# Patient Record
Sex: Male | Born: 1950 | Race: White | Hispanic: No | Marital: Married | Smoking: Former smoker
Health system: Southern US, Community
[De-identification: ages and names within clinical notes are randomized; demographics above are authoritative.]

## PROBLEM LIST (undated history)

## (undated) DIAGNOSIS — K219 Gastro-esophageal reflux disease without esophagitis: Secondary | ICD-10-CM

## (undated) DIAGNOSIS — I219 Acute myocardial infarction, unspecified: Secondary | ICD-10-CM

## (undated) DIAGNOSIS — G8929 Other chronic pain: Secondary | ICD-10-CM

## (undated) DIAGNOSIS — M5416 Radiculopathy, lumbar region: Secondary | ICD-10-CM

## (undated) DIAGNOSIS — E785 Hyperlipidemia, unspecified: Secondary | ICD-10-CM

## (undated) DIAGNOSIS — Z87442 Personal history of urinary calculi: Secondary | ICD-10-CM

## (undated) DIAGNOSIS — J189 Pneumonia, unspecified organism: Secondary | ICD-10-CM

## (undated) DIAGNOSIS — I10 Essential (primary) hypertension: Secondary | ICD-10-CM

## (undated) DIAGNOSIS — I2510 Atherosclerotic heart disease of native coronary artery without angina pectoris: Secondary | ICD-10-CM

## (undated) DIAGNOSIS — M109 Gout, unspecified: Secondary | ICD-10-CM

## (undated) DIAGNOSIS — R7303 Prediabetes: Secondary | ICD-10-CM

## (undated) DIAGNOSIS — M1990 Unspecified osteoarthritis, unspecified site: Secondary | ICD-10-CM

## (undated) DIAGNOSIS — R0600 Dyspnea, unspecified: Secondary | ICD-10-CM

## (undated) HISTORY — DX: Hyperlipidemia, unspecified: E78.5

## (undated) HISTORY — DX: Essential (primary) hypertension: I10

## (undated) HISTORY — PX: VASECTOMY: SHX75

## (undated) HISTORY — DX: Acute myocardial infarction, unspecified: I21.9

## (undated) HISTORY — PX: WISDOM TOOTH EXTRACTION: SHX21

## (undated) HISTORY — PX: CORONARY ANGIOPLASTY WITH STENT PLACEMENT: SHX49

## (undated) HISTORY — PX: BACK SURGERY: SHX140

## (undated) HISTORY — PX: CATARACT EXTRACTION: SUR2

## (undated) HISTORY — PX: TONSILLECTOMY: SUR1361

---

## 1972-04-13 HISTORY — PX: APPENDECTOMY: SHX54

## 1981-04-13 HISTORY — PX: KNEE ARTHROSCOPY: SHX127

## 2000-04-13 DIAGNOSIS — I219 Acute myocardial infarction, unspecified: Secondary | ICD-10-CM

## 2000-04-13 HISTORY — DX: Acute myocardial infarction, unspecified: I21.9

## 2006-04-13 HISTORY — PX: HEMI-MICRODISCECTOMY LUMBAR LAMINECTOMY LEVEL 1: SHX5846

## 2006-12-31 IMAGING — CR DG CHEST 2V
2 series · 2 of 2 positions shown · non-contrast
Comparison: none

CLINICAL DATA: Preop for L4-5 laminectomy. 
 CHEST - 2 VIEW:

[view not recorded (1 of 2)]
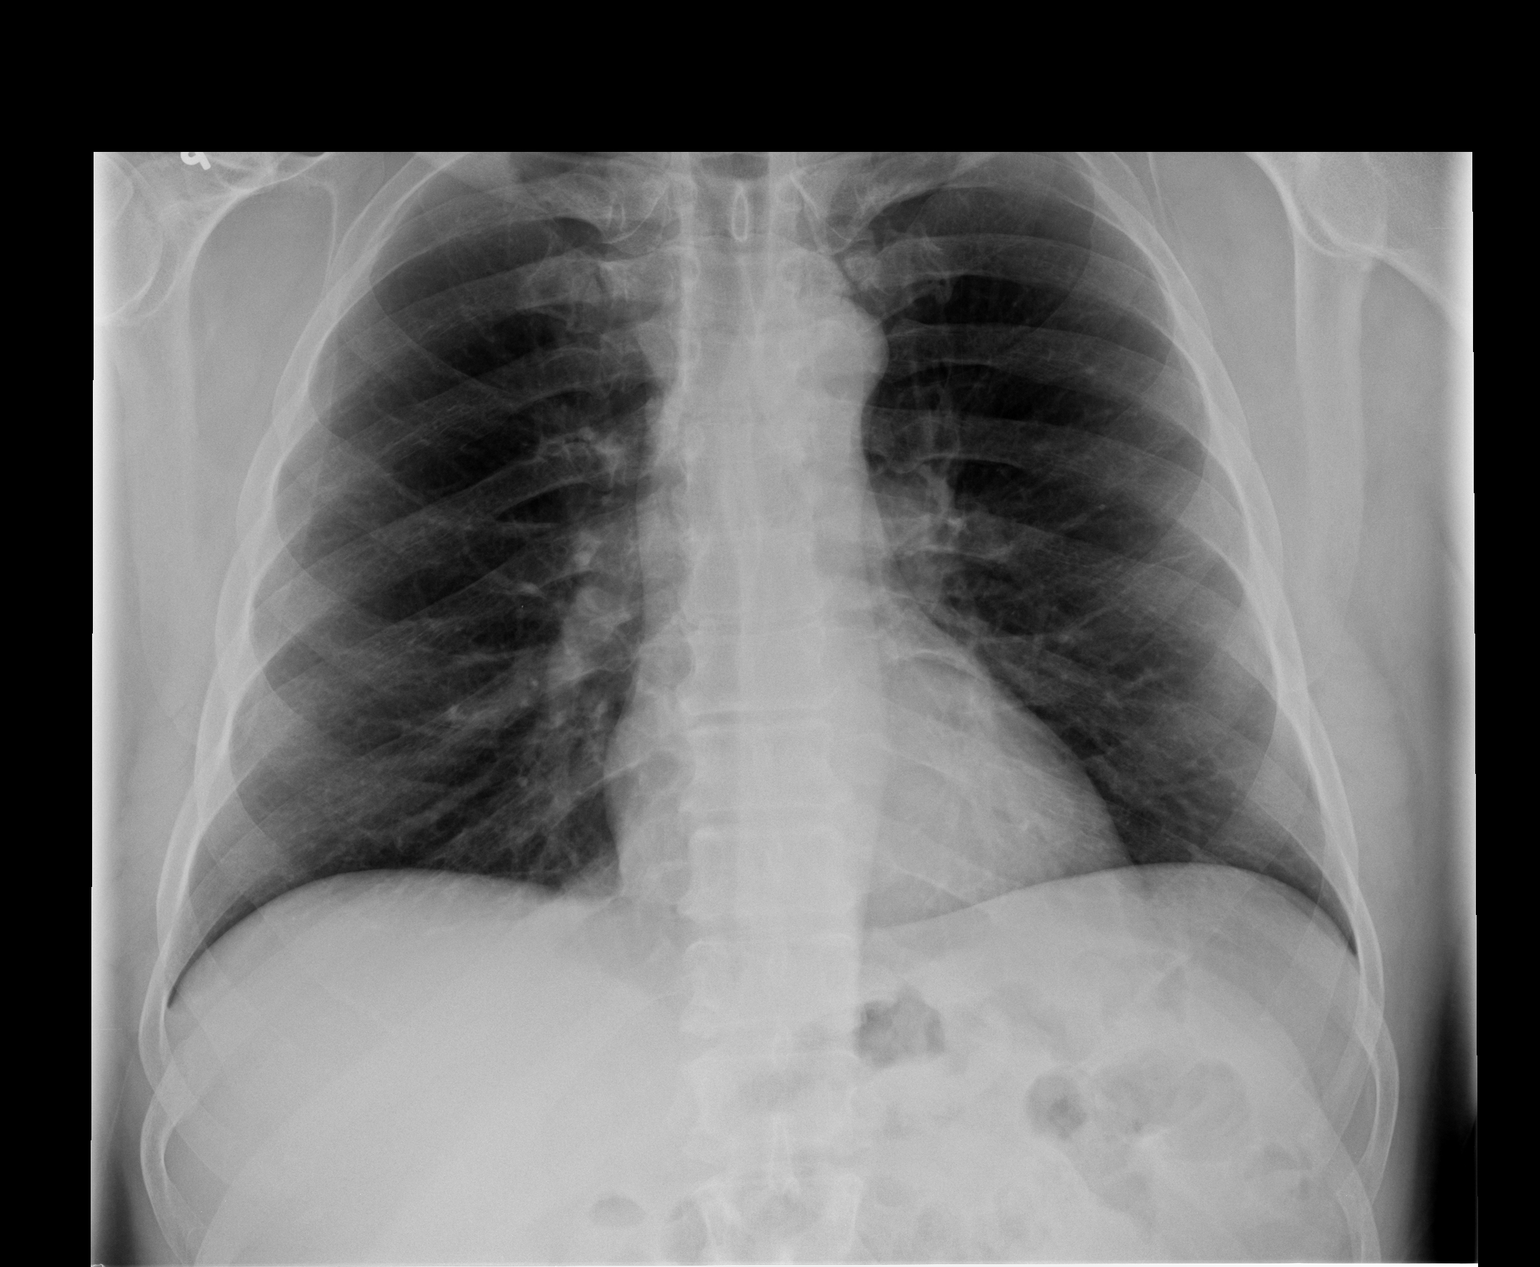

[view not recorded (2 of 2)]
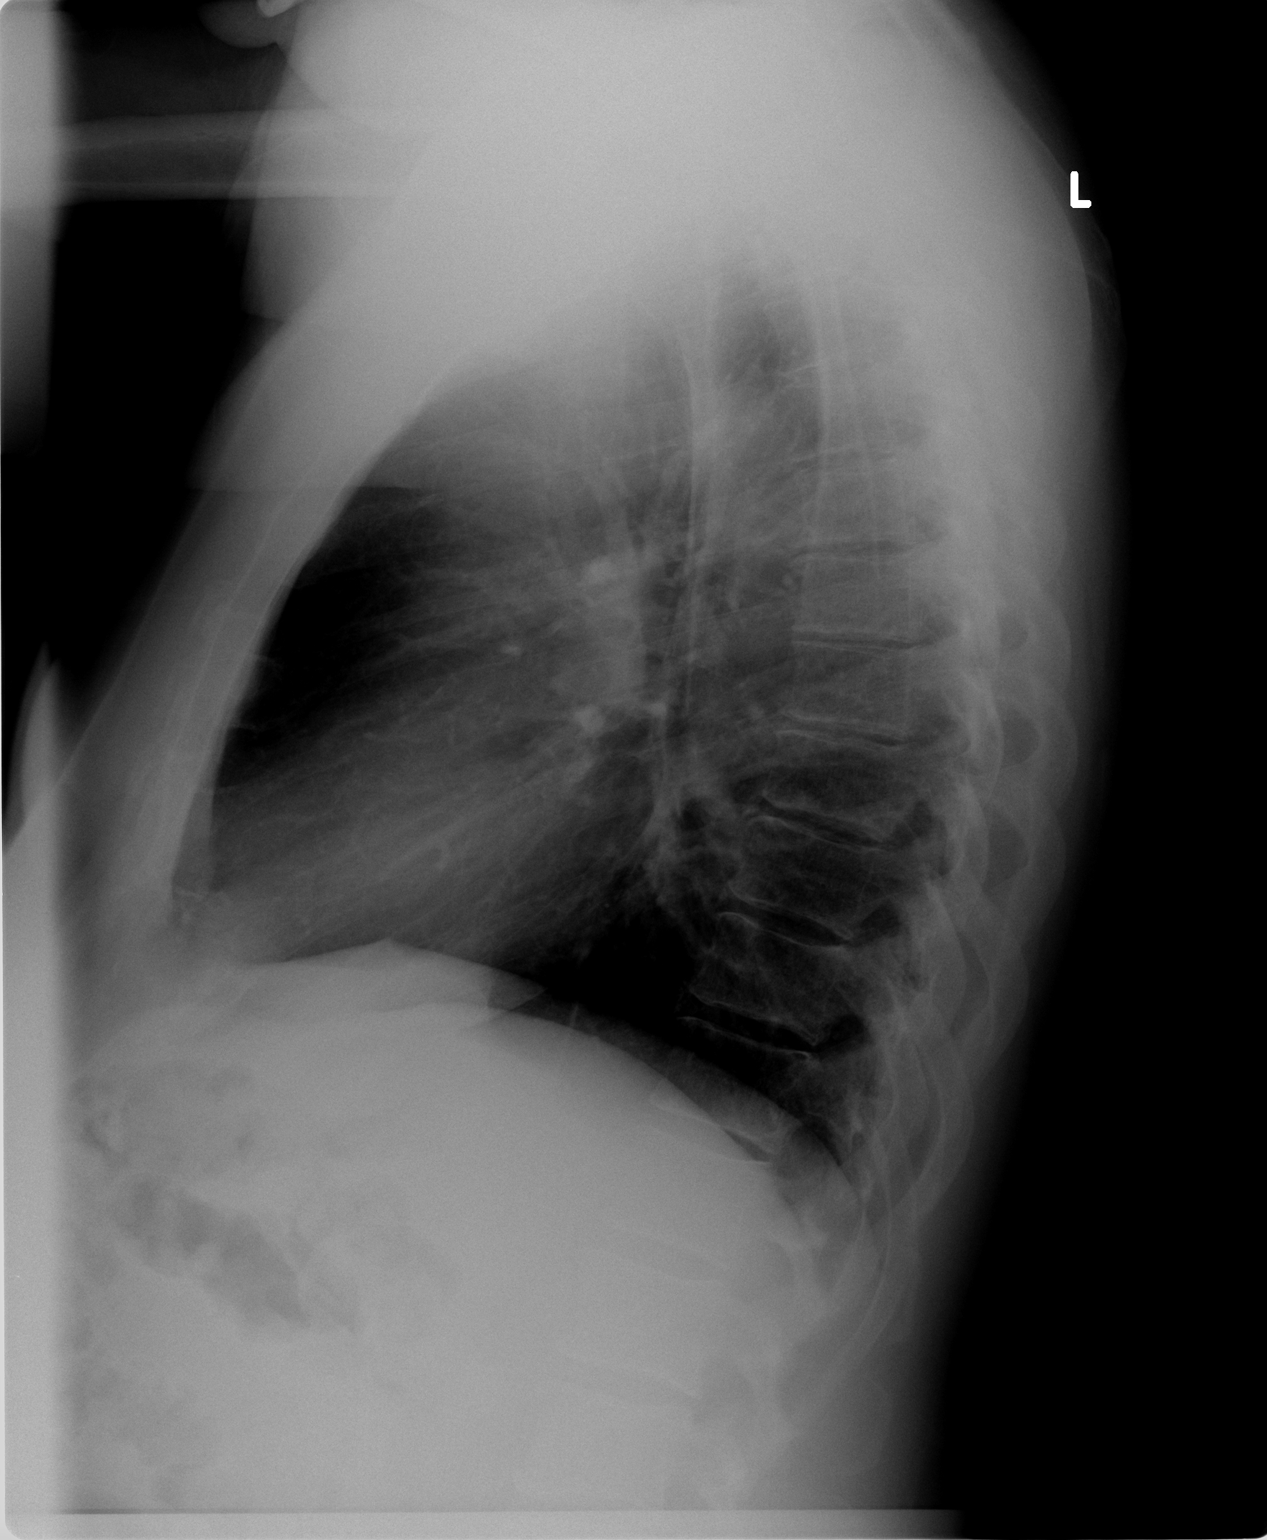

[2 of 2 positions shown; findings below may reference images not displayed]

FINDINGS: Two views of the chest show the lungs to be clear.  The heart is within normal limits in size.   No bony abnormality is seen.
IMPRESSION: No active lung disease.

## 2007-01-04 ENCOUNTER — Ambulatory Visit (HOSPITAL_COMMUNITY): Admission: RE | Admit: 2007-01-04 | Discharge: 2007-01-05 | Payer: Self-pay | Admitting: Neurosurgery

## 2007-01-04 IMAGING — CR DG LUMBAR SPINE 1V
1 series · 1 of 1 positions shown · non-contrast
Comparison: none

CLINICAL DATA: L4-5 HNP.  
 PORTABLE LATERAL VIEW OF THE LUMBAR SPINE:

[view not recorded]
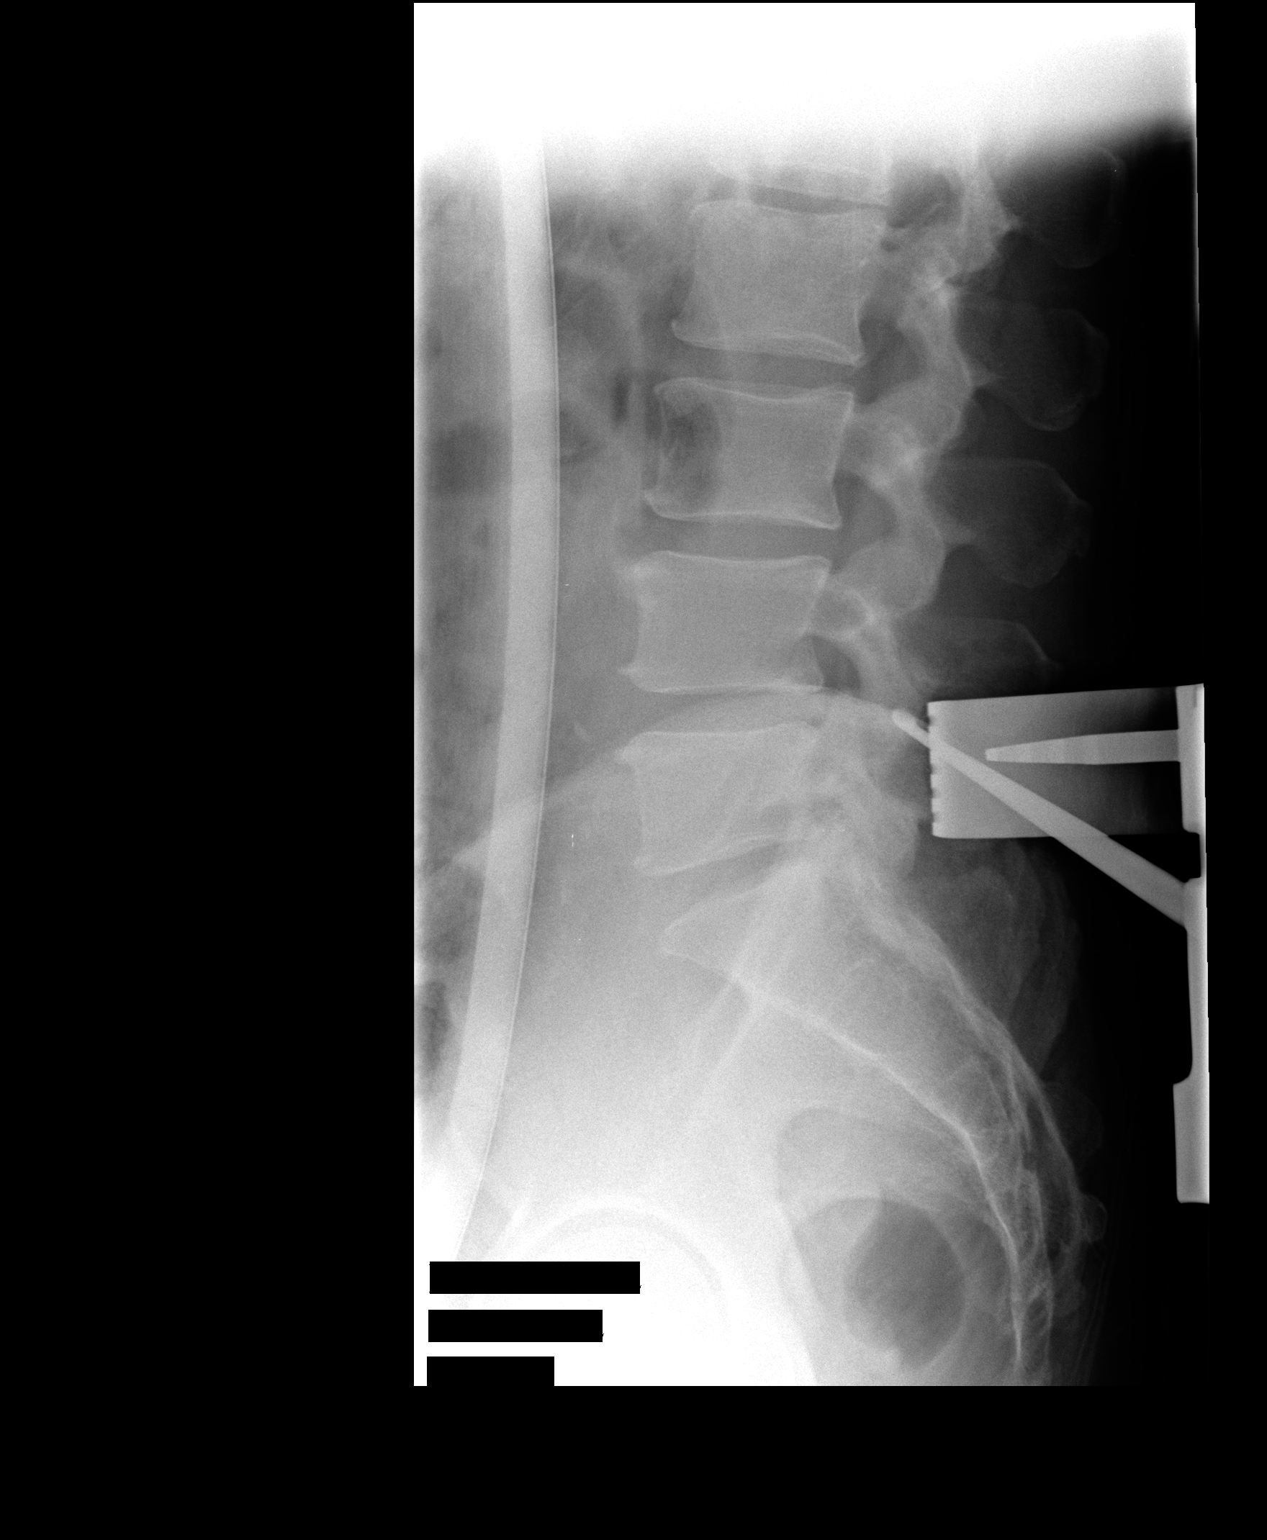

[1 of 1 positions shown; findings below may reference images not displayed]

FINDINGS: Portable lateral view of the lumbar spine on [DATE] taken at [DATE] demonstrates a probe posteriorly located at the L4- level.
IMPRESSION: As above.

## 2010-08-26 NOTE — Op Note (Signed)
NAME:  Robert Ramos, Robert Ramos            ACCOUNT NO.:  [id number]   MEDICAL RECORD NO.:  [id number]          PATIENT TYPE:  OIB   LOCATION:  3172                         FACILITY:  [facility]   PHYSICIAN:  Danae Orleans. Venetia Maxon, M.D.  DATE OF BIRTH:  [birth year]   DATE OF PROCEDURE:  01/04/2007  DATE OF DISCHARGE:                               OPERATIVE REPORT   PREOPERATIVE DIAGNOSIS:  Herniated lumbar disc L4-L5, left, with  spondylosis, degenerative disc disease, and radiculopathy.   POSTOPERATIVE DIAGNOSIS:  Herniated lumbar disc L4-L5, left, with  spondylosis, degenerative disc disease, and radiculopathy.   PROCEDURE:  Left L4 hemilaminotomy with microdiscectomy with  microdissection.   SURGEON:  Danae Orleans. Venetia Maxon, M.D.   ASSISTANT:  Hewitt Shorts, M.D.  Georgiann Cocker, RN   ANESTHESIA:  General endotracheal anesthesia.   BLOOD LOSS:  Minimal.   COMPLICATIONS:  None.   DISPOSITION:  To recovery.   INDICATIONS:  Robert Ramos is a 60 year old man with severe left leg  pain.  He has extraforaminal cephalad migrated free fragment of  herniated disc from L4-L5 which was compressing the left L4 nerve root.  He underwent a series of injections of selective nerve blocks which did  not give him complete relief of his pain and it was, therefore, elected  to take him surgery.   DESCRIPTION OF PROCEDURE:  Robert Ramos was brought to the operating  room.  Following satisfactory and uncomplicated induction of general  endotracheal anesthesia and placement of intravenous lines, the patient  was placed in a prone position on the Wilson frame.  His low back was  then shaved, prepped and draped in the usual sterile fashion.  The area  of planned incision was infiltrated with 0.25% Marcaine and 0.5%  lidocaine with 1:100,000 epinephrine.  An incision was made in the  midline and carried through subcutaneous fat to the lumbodorsal fascia  which was incised on the left side of midline.   Subperiosteal dissection  was performed exposing the L4-L5 interspace.  A self-retaining retractor  was placed. Intraoperative x-ray confirmed the correct level.  Subsequently, a hemilaminectomy of L4 was performed with a high speed  drill and completed with a Kerrison rongeur.  The ligamentum flavum was  then detached and removed in a piecemeal fashion and the lateral aspect  of the thecal sac was identified.  The microscope was brought into field  and, using microdissection technique, the lateral aspect of the thecal  sac was mobilized exposing the a free fragment of herniated disc  material.  Multiple large fragments of disc material were removed and  the course of the L4 nerve root was then palpated and additional  fragments were removed.  The pedicle was palpated with ball tip probes  and there did not appear to be any residual compression.  The foramen  was well decompressed and the thecal sac, itself, did not appear to have  any residual compression.  Hemostasis was assured and subsequently the  operative bed was irrigated with bacitracin saline.  Then, fentanyl and  Depo-Medrol was instilled in the operative site.  The self-retaining  retractor was removed.  The lumbodorsal fascia was closed with 0 Vicryl  suture, the subcutaneous tissues were approximated with 2-0 Vicryl  interrupted inverted sutures, the skin edges were reapproximated with  interrupted 3-0 Vicryl subcuticular stitch.  The wound was dressed with  Dermabond.  The patient was extubated in the operating room and taken to  the recovery room in satisfactory condition having tolerated the  operation well.  Counts correct at the end of the case.      Danae Orleans. Venetia Maxon, M.D.  Electronically Signed     JDS/MEDQ  D:  01/04/2007  T:  01/04/2007  Job:  [id number]

## 2011-01-22 LAB — CBC
HCT: 39.5
Hemoglobin: 13.4
MCV: 89.8
RDW: 13.6
WBC: 10.3

## 2011-01-22 LAB — BASIC METABOLIC PANEL
BUN: 14
Chloride: 101
GFR calc non Af Amer: >60
Glucose, Bld: 85
Potassium: 3.7
Sodium: 138

## 2015-08-27 DIAGNOSIS — I2510 Atherosclerotic heart disease of native coronary artery without angina pectoris: Secondary | ICD-10-CM

## 2015-08-27 DIAGNOSIS — E782 Mixed hyperlipidemia: Secondary | ICD-10-CM

## 2015-08-27 DIAGNOSIS — R5381 Other malaise: Secondary | ICD-10-CM | POA: Insufficient documentation

## 2015-08-27 DIAGNOSIS — R7303 Prediabetes: Secondary | ICD-10-CM

## 2015-08-27 DIAGNOSIS — I10 Essential (primary) hypertension: Secondary | ICD-10-CM | POA: Insufficient documentation

## 2015-08-27 DIAGNOSIS — N183 Chronic kidney disease, stage 3 unspecified: Secondary | ICD-10-CM

## 2015-08-27 DIAGNOSIS — N200 Calculus of kidney: Secondary | ICD-10-CM

## 2015-08-27 DIAGNOSIS — R5383 Other fatigue: Secondary | ICD-10-CM

## 2015-08-27 DIAGNOSIS — E291 Testicular hypofunction: Secondary | ICD-10-CM | POA: Insufficient documentation

## 2015-08-27 DIAGNOSIS — Z79899 Other long term (current) drug therapy: Secondary | ICD-10-CM | POA: Insufficient documentation

## 2015-08-27 HISTORY — DX: Calculus of kidney: N20.0

## 2015-08-27 HISTORY — DX: Other long term (current) drug therapy: Z79.899

## 2015-08-27 HISTORY — DX: Prediabetes: R73.03

## 2015-08-27 HISTORY — DX: Other malaise: R53.81

## 2015-08-27 HISTORY — DX: Atherosclerotic heart disease of native coronary artery without angina pectoris: I25.10

## 2015-08-27 HISTORY — DX: Testicular hypofunction: E29.1

## 2015-08-27 HISTORY — DX: Mixed hyperlipidemia: E78.2

## 2015-08-27 HISTORY — DX: Other fatigue: R53.83

## 2015-08-27 HISTORY — DX: Chronic kidney disease, stage 3 unspecified: N18.30

## 2015-08-27 HISTORY — DX: Essential (primary) hypertension: I10

## 2016-11-04 DIAGNOSIS — M48062 Spinal stenosis, lumbar region with neurogenic claudication: Secondary | ICD-10-CM

## 2016-11-04 HISTORY — DX: Spinal stenosis, lumbar region with neurogenic claudication: M48.062

## 2016-11-17 ENCOUNTER — Encounter: Payer: Self-pay | Admitting: Cardiology

## 2016-11-17 ENCOUNTER — Ambulatory Visit (INDEPENDENT_AMBULATORY_CARE_PROVIDER_SITE_OTHER): Payer: PPO | Admitting: Cardiology

## 2016-11-17 VITALS — BP 140/80 | HR 76 | Resp 14 | Ht 65.0 in | Wt 199.8 lb

## 2016-11-17 DIAGNOSIS — E785 Hyperlipidemia, unspecified: Secondary | ICD-10-CM | POA: Diagnosis not present

## 2016-11-17 DIAGNOSIS — I209 Angina pectoris, unspecified: Secondary | ICD-10-CM

## 2016-11-17 DIAGNOSIS — R0602 Shortness of breath: Secondary | ICD-10-CM | POA: Diagnosis not present

## 2016-11-17 DIAGNOSIS — I25118 Atherosclerotic heart disease of native coronary artery with other forms of angina pectoris: Secondary | ICD-10-CM

## 2016-11-17 DIAGNOSIS — I2510 Atherosclerotic heart disease of native coronary artery without angina pectoris: Secondary | ICD-10-CM | POA: Insufficient documentation

## 2016-11-17 DIAGNOSIS — Z01812 Encounter for preprocedural laboratory examination: Secondary | ICD-10-CM | POA: Diagnosis not present

## 2016-11-17 DIAGNOSIS — G4733 Obstructive sleep apnea (adult) (pediatric): Secondary | ICD-10-CM | POA: Diagnosis not present

## 2016-11-17 DIAGNOSIS — I200 Unstable angina: Secondary | ICD-10-CM | POA: Insufficient documentation

## 2016-11-17 HISTORY — DX: Shortness of breath: R06.02

## 2016-11-17 HISTORY — DX: Obstructive sleep apnea (adult) (pediatric): G47.33

## 2016-11-17 HISTORY — DX: Hyperlipidemia, unspecified: E78.5

## 2016-11-17 MED ORDER — ISOSORBIDE MONONITRATE ER 30 MG PO TB24: 30.0000 mg | ORAL_TABLET | Freq: Every day | ORAL | 3 refills | Status: DC

## 2016-11-17 MED ORDER — ASPIRIN EC 81 MG PO TBEC: 81.0000 mg | DELAYED_RELEASE_TABLET | Freq: Every day | ORAL | 3 refills | Status: AC

## 2016-11-17 MED ORDER — NITROGLYCERIN 0.4 MG SL SUBL: 0.4000 mg | SUBLINGUAL_TABLET | SUBLINGUAL | 2 refills | Status: DC | PRN

## 2016-11-17 MED ORDER — ROSUVASTATIN CALCIUM 40 MG PO TABS: 40.0000 mg | ORAL_TABLET | Freq: Every day | ORAL | 3 refills | Status: DC

## 2016-11-17 NOTE — Progress Notes (Signed)
Cardiology Consultation:    Date:  11/17/2016   ID:  Robert Ramos, DOB 08-16-1950, MRN [id number]  PCP:  [name], MD  Cardiologist:  Jenne Campus, MD   Referring MD: No ref. provider found   Chief Complaint  Patient presents with  . Follow-up  Chest pain  History of Present Illness:    Robert Ramos is a 66 y.o. male who is being seen today for the evaluation of Chest pain at the request of No ref. provider found patient is a 66 years old gentleman whom I seen last time in 2002. At time he had acute myocardial infarction, stent was placed, and after that he was doing fine however disappeared from follow-up because of financial issues. Recently he got new insurance is here to for [place] to follow up. 4 last few months he's been experiencing exertional shortness of breath and chest tightness. This gradually became more frequent and more severe. He said even walking across the [place] will bring this up. When asking about climbing stairs again he said he cannot do it because of shortness of breath and chest tightness that happen when he climbs stairs. Rest few minutes and then he can continue. There is no palpitations no swelling of lower extremities no dizziness no passing out. He described also episodes when he wakes up in the middle of denied with his heart pounding. Also sometimes he wakes up sweating in the middle of denied. His wife who is present in the room during our discussion described the fact that he snores a lot and stopped breathing at night..   Past Medical History:  Diagnosis Date  . Heart attack ([place])   . Hyperlipidemia   . Hypertension     Past Surgical History:  Procedure Laterality Date  . BACK SURGERY    . CARDIAC CATHETERIZATION    . CORONARY ANGIOPLASTY      Current Medications: Current Meds  Medication Sig  . metoprolol succinate (TOPROL-XL) 50 MG 24 hr tablet Take 50 mg by mouth daily.  . simvastatin (ZOCOR) 80 MG tablet Take 80 mg by  mouth daily.     Allergies:   Penicillin g   Social History   Social History  . Marital status: Married    Spouse name: N/A  . Number of children: N/A  . Years of education: N/A   Social History Main Topics  . Smoking status: Former Research scientist (life sciences)  . Smokeless tobacco: Never Used  . Alcohol use Yes  . Drug use: No  . Sexual activity: Not Asked   Other Topics Concern  . None   Social History Narrative  . None     Family History: The patient's family history includes Cancer in his father. ROS:   Please see the history of present illness.    All 14 point review of systems negative except as described per history of present illness.  EKGs/Labs/Other Studies Reviewed:    The following studies were reviewed today:     Recent Labs: No results found for requested labs within last 8760 hours.  Recent Lipid Panel No results found for: CHOL, TRIG, HDL, CHOLHDL, VLDL, LDLCALC, LDLDIRECT  Physical Exam:    VS:  BP 140/80   Pulse 76   Resp 14   Ht 5\' 5"  (1.651 m)   Wt 199 lb 12.8 oz (90.6 kg)   BMI 33.25 kg/m     Wt Readings from Last 3 Encounters:  11/17/16 199 lb 12.8 oz (90.6 kg)  GEN:  Well nourished, well developed in no acute distress HEENT: Normal NECK: No JVD; No carotid bruits LYMPHATICS: No lymphadenopathy CARDIAC: RRR, no murmurs, no rubs, no gallops RESPIRATORY:  Clear to auscultation without rales, wheezing or rhonchi  ABDOMEN: Soft, non-tender, non-distended MUSCULOSKELETAL:  No edema; No deformity  SKIN: Warm and dry NEUROLOGIC:  Alert and oriented x 3 PSYCHIATRIC:  Normal affect   ASSESSMENT:    1. Coronary artery disease of native artery of native heart with stable angina pectoris ([place])   2. Dyslipidemia   3. Obstructive sleep apnea   4. Shortness of breath   5. Angina pectoris ([place])    PLAN:    In order of problems listed above:  1. Coronary artery disease with typical angina pectoris that is became more frequent and longer episodes. I  will ask him to start taking aspirin every single day, will give him nitroglycerin when necessary and instructions how to use it, he is already on beta blocker which I will continue. I will also give him long-lasting nitroglycerin in form of Imdur 30 mg daily. I warned him about potential side effects of this medication which includes headache. We talked in length about what to do with this situation and we have basically 2 options either stress testing or cardiac catheterization. He tells me that his symptoms that he experienced accepted same like he expressed before when he ended up having myocardial infarction. On top of that for years he has not been taking any medications. I strongly suspect that she does have significant reactivation of coronary artery disease and cardiac catheterization will be the best way to look into it. He agreed with me and decided to proceed. Procedure was explained to him including all risks benefits as well as alternatives. 2. Dyslipidemia: I will give him Crestor 40 mg daily. 3. Obstructive sleep apnea: In the future he'll require sleep study but cardiac catheterization to be done first. 4. This of breath could be related to coronary artery disease and we'll try to clarify this by doing cardiac catheterization.   Medication Adjustments/Labs and Tests Ordered: Current medicines are reviewed at length with the patient today.  Concerns regarding medicines are outlined above.  No orders of the defined types were placed in this encounter.  No orders of the defined types were placed in this encounter.   Signed, Park Liter, MD, [facility]. 11/17/2016 11:51 AM    [place]

## 2016-11-17 NOTE — Patient Instructions (Addendum)
Medication Instructions:  Your physician has recommended you make the following change in your medication:  1.) Start taking aspirin 81 mg daily. 2.) Start Crestor 40 mg daily. 3.) A prescription has been sent for Nitroglycerin 0.4 mg sublingual as needed for chest pain. The proper use and anticipated side effects of nitroglycerine has been carefully explained.  If a single episode of chest pain is not relieved by one tablet, the patient will try another within 5 minutes; and if this doesn't relieve the pain, the patient is instructed to call 911 for transportation to an emergency department. 4.)Start Imdur 30 mg daily.   Labwork: Your physician recommends that you have labs today for your procedure: CBC, BMP, PT/INR  Testing/Procedures: Your physician has requested that you have a cardiac catheterization. Cardiac catheterization is used to diagnose and/or treat various heart conditions. Doctors may recommend this procedure for a number of different reasons. The most common reason is to evaluate chest pain. Chest pain can be a symptom of coronary artery disease (CAD), and cardiac catheterization can show whether plaque is narrowing or blocking your heart's arteries. This procedure is also used to evaluate the valves, as well as measure the blood flow and oxygen levels in different parts of your heart. For further information please visit [email or web address]. Please follow instruction sheet, as given.   Your provider has recommended a cardiac catherization  You are scheduled for a cardiac catheterization on Friday, August 10, at 07:30 am. with Dr. Saunders Revel or associate.  Please arrive at the [facility] (Main Entrance) at [facility] at [facility] on Friday, November 20, 2016 at 05:30 a.m.    Special note: Every effort is made to have your procedure done on time.   Please understand that emergencies sometimes delay a scheduled   procedure.  No food or  drink after midnight on Thursday, November 19, 2016 . On the morning of your procedure, take your Aspirin 81 mg.   You may take your morning medications with a sip of water on the day of your procedure.  Medications to HOLD - if you take any oral diabetic medications not listed on your medication list you need to notify us right away to discuss this.   Plan for a one night stay -- bring personal belongings.  Bring a current list of your medications and current insurance cards.  You MUST have a responsible person to drive you home. Someone MUST be with you the first 24 hours after you arrive home or your discharge will be delayed. Wear clothes that are easy to get on and off and wear slip on shoes.    Coronary Angiogram A coronary angiogram, also called coronary angiography, is an X-ray procedure used to look at the arteries in the heart. In this procedure, a dye (contrast dye) is injected through a long, hollow tube (catheter). The catheter is about the size of a piece of cooked spaghetti and is inserted through your groin, wrist, or arm. The dye is injected into each artery, and X-rays are then taken to show if there is a blockage in the arteries of your heart.  LET [facility] CARE PROVIDER KNOW ABOUT:  Any allergies you have, including allergies to shellfish or contrast dye.   All medicines you are taking, including vitamins, herbs, eye drops, creams, and over-the-counter medicines.   Previous problems you or members of your family have had with the use of anesthetics.   Any  blood disorders you have.   Previous surgeries you have had.  History of kidney problems or failure.   Other medical conditions you have.  RISKS AND COMPLICATIONS  Generally, a coronary angiogram is a safe procedure. However, about 1 person out of 1000 can have problems that may include:  Allergic reaction to the dye.  Bleeding/bruising from the access site or other locations.  Kidney injury, especially  in people with impaired kidney function.  Stroke (rare).  Heart attack (rare).  Irregular rhythms (rare)  Death (rare)  BEFORE THE PROCEDURE   Do not eat or drink anything after midnight the night before the procedure or as directed by your health care provider.   Ask your health care provider about changing or stopping your regular medicines. This is especially important if you are taking diabetes medicines or blood thinners.  PROCEDURE  You may be given a medicine to help you relax (sedative) before the procedure. This medicine is given through an intravenous (IV) access tube that is inserted into one of your veins.   The area where the catheter will be inserted will be washed and shaved. This is usually done in the groin but may be done in the fold of your arm (near your elbow) or in the wrist.   A medicine will be given to numb the area where the catheter will be inserted (local anesthetic).   The health care provider will insert the catheter into an artery. The catheter will be guided by using a special type of X-ray (fluoroscopy) of the blood vessel being examined.   A special dye will then be injected into the catheter, and X-rays will be taken. The dye will help to show where any narrowing or blockages are located in the heart arteries.    AFTER THE PROCEDURE   If the procedure is done through the leg, you will be kept in bed lying flat for several hours. You will be instructed to not bend or cross your legs.  The insertion site will be checked frequently.   The pulse in your feet or wrist will be checked frequently.   Additional blood tests, X-rays, and an electrocardiogram may be done.    Follow-Up: Your physician recommends that you schedule a follow-up appointment in: 3-4 weeks after your procedure.    Any Other Special Instructions Will Be Listed Below (If Applicable).   Please note that any paperwork needing to be filled out by the provider will  need to be addressed at the front desk prior to seeing the provider. Please note that any paperwork FMLA, Disability or other documents regarding health condition is subject to a $25.00 charge that must be received prior to completion of paperwork.    If you need a refill on your cardiac medications before your next appointment, please call your pharmacy.

## 2016-11-18 LAB — CBC WITH DIFFERENTIAL/PLATELET
BASOS: 0 %
Basophils Absolute: 0 10*3/uL (ref 0.0–0.2)
EOS (ABSOLUTE): 0.3 10*3/uL (ref 0.0–0.4)
Eos: 4 %
Hematocrit: 43.5 % (ref 37.5–51.0)
Hemoglobin: 14.6 g/dL (ref 13.0–17.7)
IMMATURE GRANS (ABS): 0 10*3/uL (ref 0.0–0.1)
Immature Granulocytes: 1 %
LYMPHS: 22 %
Lymphocytes Absolute: 1.4 10*3/uL (ref 0.7–3.1)
MCH: 31.7 pg (ref 26.6–33.0)
MCHC: 33.6 g/dL (ref 31.5–35.7)
MCV: 95 fL (ref 79–97)
MONOS ABS: 0.5 10*3/uL (ref 0.1–0.9)
Monocytes: 7 %
NEUTROS ABS: 4.3 10*3/uL (ref 1.4–7.0)
Neutrophils: 66 %
PLATELETS: 234 10*3/uL (ref 150–379)
RBC: 4.6 x10E6/uL (ref 4.14–5.80)
RDW: 13.4 % (ref 12.3–15.4)
WBC: 6.5 10*3/uL (ref 3.4–10.8)

## 2016-11-18 LAB — BASIC METABOLIC PANEL
BUN / CREAT RATIO: 10 (ref 10–24)
BUN: 13 mg/dL (ref 8–27)
CHLORIDE: 104 mmol/L (ref 96–106)
CO2: 21 mmol/L (ref 20–29)
CREATININE: 1.25 mg/dL (ref 0.76–1.27)
Calcium: 9.8 mg/dL (ref 8.6–10.2)
GFR calc non Af Amer: 60 mL/min/{1.73_m2} (ref >59)
GFR, EST AFRICAN AMERICAN: 69 mL/min/{1.73_m2} (ref >59)
Glucose: 104 mg/dL — ABNORMAL HIGH (ref 65–99)
Potassium: 4.3 mmol/L (ref 3.5–5.2)
Sodium: 143 mmol/L (ref 134–144)

## 2016-11-18 LAB — PROTIME-INR
INR: 1 (ref 0.8–1.2)
Prothrombin Time: 10.8 s (ref 9.1–12.0)

## 2016-11-19 ENCOUNTER — Telehealth: Payer: Self-pay

## 2016-11-19 NOTE — Telephone Encounter (Signed)
Detailed message left per DPR.    Patient contacted pre-catheterization at [facility] scheduled for:  11/20/2016 @ 0730 Verified arrival time and place:  [facility] @ 0530-gave directions to admissions Confirmed AM meds to be taken pre-cath with sip of water: Take ASA Patient must have responsible person to drive home post procedure and observe patient for 24 hours: wife Addl concerns:  Left this nurse name and # for any further questions

## 2016-11-20 ENCOUNTER — Ambulatory Visit (HOSPITAL_COMMUNITY)
Admission: RE | Admit: 2016-11-20 | Discharge: 2016-11-21 | Disposition: A | Discharge status: Home / Self Care | Payer: PPO | Source: Ambulatory Visit | Attending: Internal Medicine | Admitting: Internal Medicine

## 2016-11-20 ENCOUNTER — Encounter (HOSPITAL_COMMUNITY)
Admission: RE | Disposition: A | Discharge status: Home / Self Care | Payer: Self-pay | Source: Ambulatory Visit | Attending: Internal Medicine

## 2016-11-20 ENCOUNTER — Encounter (HOSPITAL_COMMUNITY): Payer: Self-pay | Admitting: Internal Medicine

## 2016-11-20 DIAGNOSIS — E785 Hyperlipidemia, unspecified: Secondary | ICD-10-CM | POA: Diagnosis present

## 2016-11-20 DIAGNOSIS — I25110 Atherosclerotic heart disease of native coronary artery with unstable angina pectoris: Secondary | ICD-10-CM | POA: Insufficient documentation

## 2016-11-20 DIAGNOSIS — Z87891 Personal history of nicotine dependence: Secondary | ICD-10-CM | POA: Insufficient documentation

## 2016-11-20 DIAGNOSIS — I200 Unstable angina: Secondary | ICD-10-CM | POA: Diagnosis present

## 2016-11-20 DIAGNOSIS — I2584 Coronary atherosclerosis due to calcified coronary lesion: Secondary | ICD-10-CM | POA: Insufficient documentation

## 2016-11-20 DIAGNOSIS — I10 Essential (primary) hypertension: Secondary | ICD-10-CM | POA: Insufficient documentation

## 2016-11-20 DIAGNOSIS — Z880 Allergy status to penicillin: Secondary | ICD-10-CM | POA: Diagnosis not present

## 2016-11-20 DIAGNOSIS — T82855A Stenosis of coronary artery stent, initial encounter: Secondary | ICD-10-CM | POA: Diagnosis not present

## 2016-11-20 DIAGNOSIS — G4733 Obstructive sleep apnea (adult) (pediatric): Secondary | ICD-10-CM | POA: Diagnosis not present

## 2016-11-20 DIAGNOSIS — I252 Old myocardial infarction: Secondary | ICD-10-CM | POA: Diagnosis not present

## 2016-11-20 DIAGNOSIS — Y831 Surgical operation with implant of artificial internal device as the cause of abnormal reaction of the patient, or of later complication, without mention of misadventure at the time of the procedure: Secondary | ICD-10-CM | POA: Diagnosis not present

## 2016-11-20 DIAGNOSIS — R0602 Shortness of breath: Secondary | ICD-10-CM | POA: Diagnosis present

## 2016-11-20 DIAGNOSIS — I2510 Atherosclerotic heart disease of native coronary artery without angina pectoris: Secondary | ICD-10-CM | POA: Diagnosis present

## 2016-11-20 HISTORY — PX: CORONARY STENT INTERVENTION: CATH118234

## 2016-11-20 HISTORY — PX: CORONARY ULTRASOUND/IVUS: CATH118244

## 2016-11-20 HISTORY — DX: Unspecified osteoarthritis, unspecified site: M19.90

## 2016-11-20 HISTORY — DX: Atherosclerotic heart disease of native coronary artery without angina pectoris: I25.10

## 2016-11-20 HISTORY — DX: Personal history of urinary calculi: Z87.442

## 2016-11-20 HISTORY — PX: LEFT HEART CATH AND CORONARY ANGIOGRAPHY: CATH118249

## 2016-11-20 LAB — POCT ACTIVATED CLOTTING TIME
ACTIVATED CLOTTING TIME: 241 s
ACTIVATED CLOTTING TIME: 246 s
Activated Clotting Time: 285 seconds

## 2016-11-20 SURGERY — LEFT HEART CATH AND CORONARY ANGIOGRAPHY
Anesthesia: LOCAL

## 2016-11-20 MED ORDER — CLOPIDOGREL BISULFATE 300 MG PO TABS
ORAL_TABLET | ORAL | Status: AC
  Filled 2016-11-20: qty 2

## 2016-11-20 MED ORDER — LIDOCAINE HCL (PF) 1 % IJ SOLN
INTRAMUSCULAR | Status: AC
  Filled 2016-11-20: qty 30

## 2016-11-20 MED ORDER — SODIUM CHLORIDE 0.9 % WEIGHT BASED INFUSION
3.0000 mL/kg/h | INTRAVENOUS | Status: DC
  Administered 2016-11-20: 3 mL/kg/h via INTRAVENOUS

## 2016-11-20 MED ORDER — ROSUVASTATIN CALCIUM 40 MG PO TABS
40.0000 mg | ORAL_TABLET | Freq: Every day | ORAL | Status: DC
  Administered 2016-11-20 – 2016-11-21 (×2): 40 mg via ORAL
  Filled 2016-11-20: qty 1
  Filled 2016-11-20 (×2): qty 2
  Filled 2016-11-20: qty 1

## 2016-11-20 MED ORDER — CLOPIDOGREL BISULFATE 75 MG PO TABS
75.0000 mg | ORAL_TABLET | Freq: Every day | ORAL | Status: DC
  Administered 2016-11-21: 10:00:00 75 mg via ORAL
  Filled 2016-11-20: qty 1

## 2016-11-20 MED ORDER — FAMOTIDINE IN NACL 20-0.9 MG/50ML-% IV SOLN
INTRAVENOUS | Status: AC | PRN
  Administered 2016-11-20: 20 mg via INTRAVENOUS

## 2016-11-20 MED ORDER — ASPIRIN 81 MG PO CHEW: 81.0000 mg | CHEWABLE_TABLET | ORAL | Status: DC

## 2016-11-20 MED ORDER — HEPARIN SODIUM (PORCINE) 1000 UNIT/ML IJ SOLN
INTRAMUSCULAR | Status: DC | PRN
  Administered 2016-11-20: 2000 [IU] via INTRAVENOUS
  Administered 2016-11-20 (×2): 4500 [IU] via INTRAVENOUS

## 2016-11-20 MED ORDER — VERAPAMIL HCL 2.5 MG/ML IV SOLN
INTRAVENOUS | Status: AC
  Filled 2016-11-20: qty 2

## 2016-11-20 MED ORDER — SODIUM CHLORIDE 0.9 % WEIGHT BASED INFUSION: 1.0000 mL/kg/h | INTRAVENOUS | Status: DC

## 2016-11-20 MED ORDER — NITROGLYCERIN 0.4 MG SL SUBL: 0.4000 mg | SUBLINGUAL_TABLET | SUBLINGUAL | Status: DC | PRN

## 2016-11-20 MED ORDER — NITROGLYCERIN 1 MG/10 ML FOR IR/CATH LAB
INTRA_ARTERIAL | Status: DC | PRN
  Administered 2016-11-20 (×2): 200 ug via INTRACORONARY

## 2016-11-20 MED ORDER — VERAPAMIL HCL 2.5 MG/ML IV SOLN
INTRAVENOUS | Status: DC | PRN
  Administered 2016-11-20: 10 mL via INTRA_ARTERIAL

## 2016-11-20 MED ORDER — HYDRALAZINE HCL 20 MG/ML IJ SOLN: 5.0000 mg | INTRAMUSCULAR | Status: DC | PRN

## 2016-11-20 MED ORDER — HEPARIN (PORCINE) IN NACL 2-0.9 UNIT/ML-% IJ SOLN
INTRAMUSCULAR | Status: AC | PRN
  Administered 2016-11-20: 1000 mL

## 2016-11-20 MED ORDER — LIDOCAINE HCL (PF) 1 % IJ SOLN
INTRAMUSCULAR | Status: DC | PRN
Start: 2016-11-20 — End: 2016-11-20
  Administered 2016-11-20: 3 mL via INTRADERMAL

## 2016-11-20 MED ORDER — SODIUM CHLORIDE 0.9% FLUSH: 3.0000 mL | Freq: Two times a day (BID) | INTRAVENOUS | Status: DC

## 2016-11-20 MED ORDER — SODIUM CHLORIDE 0.9% FLUSH: 3.0000 mL | INTRAVENOUS | Status: DC | PRN

## 2016-11-20 MED ORDER — ACETAMINOPHEN 325 MG PO TABS: 650.0000 mg | ORAL_TABLET | ORAL | Status: DC | PRN

## 2016-11-20 MED ORDER — SODIUM CHLORIDE 0.9% FLUSH
3.0000 mL | Freq: Two times a day (BID) | INTRAVENOUS | Status: DC
  Administered 2016-11-20 (×2): 3 mL via INTRAVENOUS

## 2016-11-20 MED ORDER — MIDAZOLAM HCL 2 MG/2ML IJ SOLN
INTRAMUSCULAR | Status: DC | PRN
  Administered 2016-11-20 (×2): 1 mg via INTRAVENOUS

## 2016-11-20 MED ORDER — HEPARIN (PORCINE) IN NACL 2-0.9 UNIT/ML-% IJ SOLN
INTRAMUSCULAR | Status: AC
  Filled 2016-11-20: qty 1000

## 2016-11-20 MED ORDER — SODIUM CHLORIDE 0.9 % IV SOLN: 250.0000 mL | INTRAVENOUS | Status: DC | PRN

## 2016-11-20 MED ORDER — NITROGLYCERIN 1 MG/10 ML FOR IR/CATH LAB
INTRA_ARTERIAL | Status: AC
  Filled 2016-11-20: qty 10

## 2016-11-20 MED ORDER — ASPIRIN EC 81 MG PO TBEC
81.0000 mg | DELAYED_RELEASE_TABLET | Freq: Every day | ORAL | Status: DC
  Filled 2016-11-20: qty 1

## 2016-11-20 MED ORDER — TIROFIBAN HCL IN NACL 5-0.9 MG/100ML-% IV SOLN
INTRAVENOUS | Status: AC
  Filled 2016-11-20: qty 100

## 2016-11-20 MED ORDER — METOPROLOL TARTRATE 25 MG PO TABS
25.0000 mg | ORAL_TABLET | Freq: Two times a day (BID) | ORAL | Status: DC
  Administered 2016-11-20 – 2016-11-21 (×2): 25 mg via ORAL
  Filled 2016-11-20 (×2): qty 1

## 2016-11-20 MED ORDER — ISOSORBIDE MONONITRATE ER 30 MG PO TB24
30.0000 mg | ORAL_TABLET | Freq: Every day | ORAL | Status: DC
  Administered 2016-11-20 – 2016-11-21 (×2): 30 mg via ORAL
  Filled 2016-11-20 (×2): qty 1

## 2016-11-20 MED ORDER — SODIUM CHLORIDE 0.9 % IV SOLN
INTRAVENOUS | Status: DC
  Administered 2016-11-20: 10:00:00 via INTRAVENOUS

## 2016-11-20 MED ORDER — FENTANYL CITRATE (PF) 100 MCG/2ML IJ SOLN
INTRAMUSCULAR | Status: DC | PRN
  Administered 2016-11-20 (×2): 50 ug via INTRAVENOUS

## 2016-11-20 MED ORDER — LABETALOL HCL 5 MG/ML IV SOLN: 10.0000 mg | INTRAVENOUS | Status: DC | PRN

## 2016-11-20 MED ORDER — IOPAMIDOL (ISOVUE-370) INJECTION 76%
INTRAVENOUS | Status: AC
Start: 2016-11-20 — End: ?
  Filled 2016-11-20: qty 100

## 2016-11-20 MED ORDER — CLOPIDOGREL BISULFATE 300 MG PO TABS
ORAL_TABLET | ORAL | Status: DC | PRN
  Administered 2016-11-20: 600 mg via ORAL

## 2016-11-20 MED ORDER — FAMOTIDINE IN NACL 20-0.9 MG/50ML-% IV SOLN
INTRAVENOUS | Status: AC
Start: 2016-11-20 — End: ?
  Filled 2016-11-20: qty 50

## 2016-11-20 MED ORDER — TIROFIBAN HCL IN NACL 5-0.9 MG/100ML-% IV SOLN
INTRAVENOUS | Status: DC | PRN
  Administered 2016-11-20: 0.075 ug/kg/min via INTRAVENOUS

## 2016-11-20 MED ORDER — ONDANSETRON HCL 4 MG/2ML IJ SOLN: 4.0000 mg | Freq: Four times a day (QID) | INTRAMUSCULAR | Status: DC | PRN

## 2016-11-20 MED ORDER — ANGIOPLASTY BOOK
Freq: Once | Status: AC
  Administered 2016-11-20: 21:00:00
  Filled 2016-11-20: qty 1

## 2016-11-20 MED ORDER — HEPARIN SODIUM (PORCINE) 1000 UNIT/ML IJ SOLN
INTRAMUSCULAR | Status: AC
Start: 2016-11-20 — End: ?
  Filled 2016-11-20: qty 1

## 2016-11-20 MED ORDER — MIDAZOLAM HCL 2 MG/2ML IJ SOLN
INTRAMUSCULAR | Status: AC
  Filled 2016-11-20: qty 2

## 2016-11-20 MED ORDER — FENTANYL CITRATE (PF) 100 MCG/2ML IJ SOLN
INTRAMUSCULAR | Status: AC
  Filled 2016-11-20: qty 2

## 2016-11-20 MED ORDER — IOPAMIDOL (ISOVUE-370) INJECTION 76%
INTRAVENOUS | Status: DC | PRN
  Administered 2016-11-20: 160 mL via INTRA_ARTERIAL

## 2016-11-20 MED ORDER — TIROFIBAN (AGGRASTAT) BOLUS VIA INFUSION
INTRAVENOUS | Status: DC | PRN
  Administered 2016-11-20: 2257.5 ug via INTRAVENOUS

## 2016-11-20 SURGICAL SUPPLY — 21 items
BALLN SAPPHIRE 2.5X12 (BALLOONS) ×2
BALLN SAPPHIRE ~~LOC~~ 3.5X8 (BALLOONS) ×2 IMPLANT
BALLN ~~LOC~~ EUPHORA RX 4.0X6 (BALLOONS) ×2
BALLOON SAPPHIRE 2.5X12 (BALLOONS) ×1 IMPLANT
BALLOON ~~LOC~~ EUPHORA RX 4.0X6 (BALLOONS) ×1 IMPLANT
CATH EXPO 5F FL3.5 (CATHETERS) ×2 IMPLANT
CATH EXPO 5FR FR4 (CATHETERS) ×2 IMPLANT
CATH LAUNCHER 6FR EBU3.5 (CATHETERS) ×2 IMPLANT
CATH OPTICROSS 40MHZ (CATHETERS) ×2 IMPLANT
DEVICE RAD COMP TR BAND LRG (VASCULAR PRODUCTS) ×2 IMPLANT
GLIDESHEATH SLEND SS 6F .021 (SHEATH) ×2 IMPLANT
GUIDEWIRE INQWIRE 1.5J.035X260 (WIRE) ×1 IMPLANT
INQWIRE 1.5J .035X260CM (WIRE) ×2
KIT ENCORE 26 ADVANTAGE (KITS) ×2 IMPLANT
KIT HEART LEFT (KITS) ×2 IMPLANT
PACK CARDIAC CATHETERIZATION (CUSTOM PROCEDURE TRAY) ×2 IMPLANT
SLED PULL BACK IVUS (MISCELLANEOUS) ×2 IMPLANT
STENT XIENCE ALPINE RX 3.0X15 (Permanent Stent) ×2 IMPLANT
TRANSDUCER W/STOPCOCK (MISCELLANEOUS) ×2 IMPLANT
TUBING CIL FLEX 10 FLL-RA (TUBING) ×2 IMPLANT
WIRE COUGAR XT STRL 190CM (WIRE) ×2 IMPLANT

## 2016-11-20 NOTE — Care Management Note (Signed)
Case Management Note  Patient Details  Name: Robert Ramos MRN: [id number] Date of Birth: 15-Jan-1951  Subjective/Objective:   From home with wife, pta indep, he has PCP and medication coverage. S/p  Coronary stent intervention will be on plavix.                Action/Plan: NCM will follow for dc needs.   Expected Discharge Date:                  Expected Discharge Plan:  Home/Self Care  In-House Referral:     Discharge planning Services  CM Consult  Post Acute Care Choice:    Choice offered to:     DME Arranged:    DME Agency:     HH Arranged:    [place] Agency:     Status of Service:  Completed, signed off  If discussed at [employer] of Stay Meetings, dates discussed:    Additional Comments:  [name] Mayo, RN 11/20/2016, 9:54 AM

## 2016-11-20 NOTE — H&P (View-Only) (Signed)
Cardiology Consultation:    Date:  11/17/2016   ID:  Robert Ramos, DOB Feb 12, 1951, MRN [id number]  PCP:  [name], MD  Cardiologist:  Jenne Campus, MD   Referring MD: No ref. provider found   Chief Complaint  Patient presents with  . Follow-up  Chest pain  History of Present Illness:    Robert Ramos is a 66 y.o. male who is being seen today for the evaluation of Chest pain at the request of No ref. provider found patient is a 66 years old gentleman whom I seen last time in 2002. At time he had acute myocardial infarction, stent was placed, and after that he was doing fine however disappeared from follow-up because of financial issues. Recently he got new insurance is here to for [place] to follow up. 4 last few months he's been experiencing exertional shortness of breath and chest tightness. This gradually became more frequent and more severe. He said even walking across the [place] will bring this up. When asking about climbing stairs again he said he cannot do it because of shortness of breath and chest tightness that happen when he climbs stairs. Rest few minutes and then he can continue. There is no palpitations no swelling of lower extremities no dizziness no passing out. He described also episodes when he wakes up in the middle of denied with his heart pounding. Also sometimes he wakes up sweating in the middle of denied. His wife who is present in the room during our discussion described the fact that he snores a lot and stopped breathing at night..   Past Medical History:  Diagnosis Date  . Heart attack ([place])   . Hyperlipidemia   . Hypertension     Past Surgical History:  Procedure Laterality Date  . BACK SURGERY    . CARDIAC CATHETERIZATION    . CORONARY ANGIOPLASTY      Current Medications: Current Meds  Medication Sig  . metoprolol succinate (TOPROL-XL) 50 MG 24 hr tablet Take 50 mg by mouth daily.  . simvastatin (ZOCOR) 80 MG tablet Take 80 mg by  mouth daily.     Allergies:   Penicillin g   Social History   Social History  . Marital status: Married    Spouse name: N/A  . Number of children: N/A  . Years of education: N/A   Social History Main Topics  . Smoking status: Former Research scientist (life sciences)  . Smokeless tobacco: Never Used  . Alcohol use Yes  . Drug use: No  . Sexual activity: Not Asked   Other Topics Concern  . None   Social History Narrative  . None     Family History: The patient's family history includes Cancer in his father. ROS:   Please see the history of present illness.    All 14 point review of systems negative except as described per history of present illness.  EKGs/Labs/Other Studies Reviewed:    The following studies were reviewed today:     Recent Labs: No results found for requested labs within last 8760 hours.  Recent Lipid Panel No results found for: CHOL, TRIG, HDL, CHOLHDL, VLDL, LDLCALC, LDLDIRECT  Physical Exam:    VS:  BP 140/80   Pulse 76   Resp 14   Ht 5\' 5"  (1.651 m)   Wt 199 lb 12.8 oz (90.6 kg)   BMI 33.25 kg/m     Wt Readings from Last 3 Encounters:  11/17/16 199 lb 12.8 oz (90.6 kg)  GEN:  Well nourished, well developed in no acute distress HEENT: Normal NECK: No JVD; No carotid bruits LYMPHATICS: No lymphadenopathy CARDIAC: RRR, no murmurs, no rubs, no gallops RESPIRATORY:  Clear to auscultation without rales, wheezing or rhonchi  ABDOMEN: Soft, non-tender, non-distended MUSCULOSKELETAL:  No edema; No deformity  SKIN: Warm and dry NEUROLOGIC:  Alert and oriented x 3 PSYCHIATRIC:  Normal affect   ASSESSMENT:    1. Coronary artery disease of native artery of native heart with stable angina pectoris ([place])   2. Dyslipidemia   3. Obstructive sleep apnea   4. Shortness of breath   5. Angina pectoris ([place])    PLAN:    In order of problems listed above:  1. Coronary artery disease with typical angina pectoris that is became more frequent and longer episodes. I  will ask him to start taking aspirin every single day, will give him nitroglycerin when necessary and instructions how to use it, he is already on beta blocker which I will continue. I will also give him long-lasting nitroglycerin in form of Imdur 30 mg daily. I warned him about potential side effects of this medication which includes headache. We talked in length about what to do with this situation and we have basically 2 options either stress testing or cardiac catheterization. He tells me that his symptoms that he experienced accepted same like he expressed before when he ended up having myocardial infarction. On top of that for years he has not been taking any medications. I strongly suspect that she does have significant reactivation of coronary artery disease and cardiac catheterization will be the best way to look into it. He agreed with me and decided to proceed. Procedure was explained to him including all risks benefits as well as alternatives. 2. Dyslipidemia: I will give him Crestor 40 mg daily. 3. Obstructive sleep apnea: In the future he'll require sleep study but cardiac catheterization to be done first. 4. This of breath could be related to coronary artery disease and we'll try to clarify this by doing cardiac catheterization.   Medication Adjustments/Labs and Tests Ordered: Current medicines are reviewed at length with the patient today.  Concerns regarding medicines are outlined above.  No orders of the defined types were placed in this encounter.  No orders of the defined types were placed in this encounter.   Signed, Park Liter, MD, [facility]. 11/17/2016 11:51 AM    [place]

## 2016-11-20 NOTE — Progress Notes (Signed)
TR BAND REMOVAL  LOCATION:    right radial  DEFLATED PER PROTOCOL:    Yes.    TIME BAND OFF / DRESSING APPLIED:    1330   SITE UPON ARRIVAL:    Level 0  SITE AFTER BAND REMOVAL:    Level 0  CIRCULATION SENSATION AND MOVEMENT:    Within Normal Limits   Yes.    COMMENTS:   Rechecked frequently with no change in assessment

## 2016-11-20 NOTE — Interval H&P Note (Signed)
History and Physical Interval Note:  11/20/2016 7:03 AM  Robert Ramos  has presented today for cardiac catheterization, with the diagnosis of angina and shortness of breath. The various methods of treatment have been discussed with the patient and family. After consideration of risks, benefits and other options for treatment, the patient has consented to  Procedure(s): LEFT HEART CATH AND CORONARY ANGIOGRAPHY (N/A) as a surgical intervention .  The patient's history has been reviewed, patient examined, no change in status, stable for surgery.  I have reviewed the patient's chart and labs.  Questions were answered to the patient's satisfaction.    Cath Lab Visit (complete for each Cath Lab visit)  Clinical Evaluation Leading to the Procedure:   ACS: No.  Non-ACS:    Anginal Classification: CCS III  Anti-ischemic medical therapy: Minimal Therapy (1 class of medications)  Non-Invasive Test Results: No non-invasive testing performed  Prior CABG: No previous CABG  Robert Ramos

## 2016-11-21 DIAGNOSIS — G4733 Obstructive sleep apnea (adult) (pediatric): Secondary | ICD-10-CM | POA: Diagnosis not present

## 2016-11-21 DIAGNOSIS — I2584 Coronary atherosclerosis due to calcified coronary lesion: Secondary | ICD-10-CM | POA: Diagnosis not present

## 2016-11-21 DIAGNOSIS — E785 Hyperlipidemia, unspecified: Secondary | ICD-10-CM | POA: Diagnosis not present

## 2016-11-21 DIAGNOSIS — I25110 Atherosclerotic heart disease of native coronary artery with unstable angina pectoris: Secondary | ICD-10-CM | POA: Diagnosis not present

## 2016-11-21 DIAGNOSIS — Z87891 Personal history of nicotine dependence: Secondary | ICD-10-CM | POA: Diagnosis not present

## 2016-11-21 DIAGNOSIS — I10 Essential (primary) hypertension: Secondary | ICD-10-CM | POA: Diagnosis not present

## 2016-11-21 DIAGNOSIS — Z880 Allergy status to penicillin: Secondary | ICD-10-CM | POA: Diagnosis not present

## 2016-11-21 DIAGNOSIS — T82855A Stenosis of coronary artery stent, initial encounter: Secondary | ICD-10-CM | POA: Diagnosis not present

## 2016-11-21 DIAGNOSIS — I252 Old myocardial infarction: Secondary | ICD-10-CM | POA: Diagnosis not present

## 2016-11-21 LAB — BASIC METABOLIC PANEL
Anion gap: 5 (ref 5–15)
BUN: 10 mg/dL (ref 6–20)
CHLORIDE: 105 mmol/L (ref 101–111)
CO2: 30 mmol/L (ref 22–32)
CREATININE: 1.17 mg/dL (ref 0.61–1.24)
Calcium: 8.9 mg/dL (ref 8.9–10.3)
GFR calc Af Amer: >60 mL/min (ref >60)
GFR calc non Af Amer: >60 mL/min (ref >60)
GLUCOSE: 116 mg/dL — AB (ref 65–99)
POTASSIUM: 4.1 mmol/L (ref 3.5–5.1)
SODIUM: 140 mmol/L (ref 135–145)

## 2016-11-21 LAB — CBC
HEMATOCRIT: 37.2 % — AB (ref 39.0–52.0)
Hemoglobin: 12 g/dL — ABNORMAL LOW (ref 13.0–17.0)
MCH: 30.1 pg (ref 26.0–34.0)
MCHC: 32.3 g/dL (ref 30.0–36.0)
MCV: 93.2 fL (ref 78.0–100.0)
PLATELETS: 177 10*3/uL (ref 150–400)
RBC: 3.99 MIL/uL — ABNORMAL LOW (ref 4.22–5.81)
RDW: 12.8 % (ref 11.5–15.5)
WBC: 5 10*3/uL (ref 4.0–10.5)

## 2016-11-21 MED ORDER — CLOPIDOGREL BISULFATE 75 MG PO TABS: 75.0000 mg | ORAL_TABLET | Freq: Every day | ORAL | 3 refills | Status: DC

## 2016-11-21 MED ORDER — METOPROLOL TARTRATE 25 MG PO TABS
25.0000 mg | ORAL_TABLET | Freq: Two times a day (BID) | ORAL | 3 refills | Status: DC
Start: 2016-11-21 — End: 2017-09-17

## 2016-11-21 NOTE — Progress Notes (Signed)
CARDIAC REHAB PHASE I   PRE:  Rate/Rhythm: 80 SR    BP: sitting 143/88    SaO2:   MODE:  Ambulation: 550 ft   POST:  Rate/Rhythm: 99 SR    BP: sitting 167/103, with 15 min rest 133/83    SaO2:    Tolerated well, no SOB, much improved. BP elevated after walk. Down with rest. Ed completed with pt and wife. Good reception. Will refer to [place] CES, ACSM 11/21/2016 8:40 AM

## 2016-11-21 NOTE — Discharge Summary (Signed)
Discharge Summary    Patient ID: Robert Ramos,  MRN: [id number], DOB/AGE: 1951-02-12 66 y.o.  Admit date: 11/20/2016 Discharge date: 11/21/2016  Primary Care Provider: [name] Primary Cardiologist: Dr. Agustin Cree  Discharge Diagnoses    Principal Problem:   Accelerating angina [name]) Active Problems:   Coronary artery disease   Dyslipidemia   Shortness of breath   History of Present Illness     Robert Ramos is a 66 y.o. male with past medical history of CAD (s/p stent placement in 2002), HTN, and HLD who was recently evaluated by Dr. Agustin Cree for exertional chest discomfort and dyspnea occurring for the past several months.   He was started on ASA, Imdur, and Crestor at that time with a cardiac catheterization being recommended for definitive evaluation.  Risks and benefits of the procedure were reviewed with the patient and he agreed to proceed, therefore presenting to [facility] on 11/20/2016 for the procedure.   Hospital Course     Consultants: None  His cardiac catheterization showed 2-vessel disease with an 80% proximal LAD stenosis and 70% RPL branch stenosis. The stent along the mid-RCA was patent and EF was preserved at 50-55%. He underwent PCI with placement of a Xience Alpine 3.0x50mm DES to the proximal LAD. He was started on DAPT with ASA and Plavix and will need to continue on this regimen for a minimum of 6 months.   The following morning, he denied any recurrent chest pain or dyspnea on exertion. Had been ambulating down the hallway with his wife without any anginal symptoms. His right radial cath site was stable. Creatinine was stable at 1.17 with Hgb of 12.0. Telemetry showed NSR with no ectopic events.   He was last examined by Dr. Stanford Breed and deemed stable for discharge. A staff message has been sent to the office to arrange for follow-up at the [facility] office. A work note has been provided to not return to work for 7 days as he works as  a Dealer and performs heavy lifting. He was provided with an Rx for his new medications. He was discharged home in stable condition.  _____________  Discharge Physical Examination and Vitals Blood pressure 104/70, pulse 82, temperature (!) 97.5 F (36.4 C), temperature source Oral, resp. rate 15, height 5\' 5"  (1.651 m), weight 209 lb 14.1 oz (95.2 kg), SpO2 100 %.  Filed Weights   11/20/16 0540 11/20/16 0926 11/21/16 0220  Weight: 199 lb (90.3 kg) 199 lb 1.2 oz (90.3 kg) 209 lb 14.1 oz (95.2 kg)    General: Pleasant Caucasian male appearing in NAD Psych: Normal affect. Neuro: Alert and oriented X 3. Moves all extremities spontaneously. HEENT: Normal  Neck: Supple without bruits or JVD. Lungs:  Resp regular and unlabored, CTA without wheezing or rales. Heart: RRR no s3, s4, or murmurs. Abdomen: Soft, non-tender, non-distended, BS + x 4.  Extremities: No clubbing, cyanosis or edema. DP/PT/Radials 2+ and equal bilaterally. Radial cath site without ecchymosis or evidence of a hematoma.   Labs & Radiologic Studies     CBC  Recent Labs  11/21/16 0500  WBC 5.0  HGB 12.0*  HCT 37.2*  MCV 93.2  PLT 453   Basic Metabolic Panel  Recent Labs  11/21/16 0500  NA 140  K 4.1  CL 105  CO2 30  GLUCOSE 116*  BUN 10  CREATININE 1.17  CALCIUM 8.9   Liver Function Tests No results for input(s): AST, ALT, ALKPHOS, BILITOT, PROT,  ALBUMIN in the last 72 hours. No results for input(s): LIPASE, AMYLASE in the last 72 hours. Cardiac Enzymes No results for input(s): CKTOTAL, CKMB, CKMBINDEX, TROPONINI in the last 72 hours. BNP Invalid input(s): POCBNP D-Dimer No results for input(s): DDIMER in the last 72 hours. Hemoglobin A1C No results for input(s): HGBA1C in the last 72 hours. Fasting Lipid Panel No results for input(s): CHOL, HDL, LDLCALC, TRIG, CHOLHDL, LDLDIRECT in the last 72 hours. Thyroid Function Tests No results for input(s): TSH, T4TOTAL, T3FREE, THYROIDAB in the last  72 hours.  Invalid input(s): FREET3  No results found.   Diagnostic Studies/Procedures     Cardiac Catheterization: 11/20/2016 Conclusions: 1. Significant 2 vessel coronary artery disease including 80% proximal LAD stenosis involving a large first septal branch and 70% stenosis of moderate-caliber RPL branch. 2. Patent stent in the mid RCA with mild to moderate in-stent restenosis. 3. Mildly elevated left ventricular filling pressure. 4. Subtle mid anterior hypokinesis with otherwise preserved left ventricular contraction. LVEF 50-55%. 5. Successful IVUS guided PCI to proximal/mid LAD with placement of a Xience Alpine 3.0 x 15 mm drug-eluting stent.  Recommendations: 1. Overnight extended recovery. 2. Dual antiplatelet therapy with aspirin and clopidogrel for at least 6 months, ideally longer. 3. Aggressive secondary prevention.   Disposition   Pt is being discharged home today in good condition.  Follow-up Plans & Appointments    Follow-up Information    Park Liter, MD Follow up.   Specialty:  Cardiology Why:  The office will contact you to arrange Cardiology follow-up. If you do not hear from them in 3 business days, please call the number provided.  Contact information: [address] [place] [address] [phone]          Discharge Instructions    Diet - low sodium heart healthy    Complete by:  As directed    Discharge instructions    Complete by:  As directed    PLEASE REMEMBER TO BRING ALL OF YOUR MEDICATIONS TO EACH OF YOUR FOLLOW-UP OFFICE VISITS.  PLEASE ATTEND ALL SCHEDULED FOLLOW-UP APPOINTMENTS.   Activity: Increase activity slowly as tolerated. You may shower, but no soaking baths (or swimming) for 1 week. No driving for 24 hours. No lifting over 5 lbs for 1 week. No sexual activity for 1 week.   You May Return to Work: in 1 week (if applicable)  Wound Care: You may wash cath site gently with soap and water. Keep cath site  clean and dry. If you notice pain, swelling, bleeding or pus at your cath site, please call [phone].   Increase activity slowly    Complete by:  As directed       Discharge Medications     Medication List    STOP taking these medications   ibuprofen 200 MG tablet Commonly known as:  ADVIL,MOTRIN     TAKE these medications   aspirin EC 81 MG tablet Take 1 tablet (81 mg total) by mouth daily.   clopidogrel 75 MG tablet Commonly known as:  PLAVIX Take 1 tablet (75 mg total) by mouth daily with breakfast.   isosorbide mononitrate 30 MG 24 hr tablet Commonly known as:  IMDUR Take 1 tablet (30 mg total) by mouth daily.   metoprolol tartrate 25 MG tablet Commonly known as:  LOPRESSOR Take 1 tablet (25 mg total) by mouth 2 (two) times daily.   nitroGLYCERIN 0.4 MG SL tablet Commonly known as:  NITROSTAT Place 1 tablet (0.4  mg total) under the tongue every 5 (five) minutes as needed for chest pain.   rosuvastatin 40 MG tablet Commonly known as:  CRESTOR Take 1 tablet (40 mg total) by mouth daily.          Allergies Allergies  Allergen Reactions  . Penicillin G Anaphylaxis, Swelling and Rash    Has patient had a PCN reaction causing immediate rash, facial/tongue/throat swelling, SOB or lightheadedness with hypotension:Yes Has patient had a PCN reaction causing severe rash involving mucus membranes or skin necrosis:Yes--all over body Has patient had a PCN reaction that required hospitalization:No--treated in ER Has patient had a PCN reaction occurring within the last 10 years:No If all of the above answers are "NO", then may proceed with Cephalosporin use.      Outstanding Labs/Studies   FLP/ LFT's in 6 weeks.   Duration of Discharge Encounter   Greater than 30 minutes including physician time.  Signed, Erma Heritage, PA-C 11/21/2016, 7:36 AM As above, patient seen and examined. Patient denies recurrent chest pain or dyspnea. Radial cath site with no  hematoma. Continue aspirin, Plavix and statin. Check lipids and liver in 6 weeks. Continue beta blocker and nitrates. Discharge today and follow-up with Dr. Agustin Cree. > 30 min PA and physician time D2 Kirk Ruths, MD

## 2016-11-23 DIAGNOSIS — M7661 Achilles tendinitis, right leg: Secondary | ICD-10-CM | POA: Diagnosis not present

## 2016-12-15 ENCOUNTER — Encounter: Payer: Self-pay | Admitting: Cardiology

## 2016-12-15 ENCOUNTER — Other Ambulatory Visit: Payer: Self-pay | Admitting: Cardiology

## 2016-12-15 ENCOUNTER — Ambulatory Visit (INDEPENDENT_AMBULATORY_CARE_PROVIDER_SITE_OTHER): Payer: PPO | Admitting: Cardiology

## 2016-12-15 VITALS — BP 120/60 | HR 80 | Resp 14 | Ht 65.0 in | Wt 199.4 lb

## 2016-12-15 DIAGNOSIS — E785 Hyperlipidemia, unspecified: Secondary | ICD-10-CM

## 2016-12-15 DIAGNOSIS — R0602 Shortness of breath: Secondary | ICD-10-CM | POA: Diagnosis not present

## 2016-12-15 DIAGNOSIS — G4733 Obstructive sleep apnea (adult) (pediatric): Secondary | ICD-10-CM

## 2016-12-15 DIAGNOSIS — I2510 Atherosclerotic heart disease of native coronary artery without angina pectoris: Secondary | ICD-10-CM

## 2016-12-15 NOTE — Addendum Note (Signed)
Addended by: Kathyrn Sheriff on: 12/15/2016 08:50 AM   Modules accepted: Orders

## 2016-12-15 NOTE — Patient Instructions (Signed)
Medication Instructions:  Your physician has recommended you make the following change in your medication:  1.) STOP isosorbide (IMDUR).  2.) START taking over the counter enzyme COQ10.   Labwork: Your physician recommends that you have lab work today to check your kidney function, sodium and potassium.  Testing/Procedures: None  Follow-Up: Your physician recommends that you schedule a follow-up appointment in: 1 month   Any Other Special Instructions Will Be Listed Below (If Applicable).  Please note that any paperwork needing to be filled out by the provider will need to be addressed at the front desk prior to seeing the provider. Please note that any paperwork FMLA, Disability or other documents regarding health condition is subject to a $25.00 charge that must be received prior to completion of paperwork in the form of a money order or check.     If you need a refill on your cardiac medications before your next appointment, please call your pharmacy.

## 2016-12-15 NOTE — Progress Notes (Signed)
Cardiology Office Note:    Date:  12/15/2016   ID:  Audrie Lia Goldie, DOB 1950-06-03, MRN [id number]  PCP:  [name], MD  Cardiologist:  Jenne Campus, MD    Referring MD: [name], MD   Chief Complaint  Patient presents with  . Follow up Cath  follow-up after cardiac catheterization  History of Present Illness:    Robert Ramos is a 66 y.o. male  tatus post cardiac catheterization and stent to proximal left anterior descending artery. Is feeling much better but still have some shortness of breath. Chest pain disappeared. He does have chronic joint aches which giving him a lot of problems. We talked in length about what was done we talked about cardiac catheterization, all medications I advised him to start taking coenzyme Q 10, also will check his Chem-7 if Chem-7 is fine I will initiate ACE inhibitor. I will see him back in my office in about one month. Talk about healthy lifestyle and exercises.  Past Medical History:  Diagnosis Date  . Arthritis    "hands, back" (11/20/2016)  . Coronary artery disease   . Heart attack ([place]) 2002  . History of gout   . History of kidney stones   . Hyperlipidemia   . Hypertension     Past Surgical History:  Procedure Laterality Date  . APPENDECTOMY  1974  . BACK SURGERY    . CORONARY ANGIOPLASTY WITH STENT PLACEMENT  2002; 11/20/2016   "@ [place]; @ [facility]"  . CORONARY STENT INTERVENTION N/A 11/20/2016   Procedure: CORONARY STENT INTERVENTION;  Surgeon: Nelva Bush, MD;  Location: [place] [facility];  Service: Cardiovascular;  Laterality: N/A;  . HEMI-MICRODISCECTOMY LUMBAR LAMINECTOMY LEVEL 1 Left 2008   L4  . INTRAVASCULAR ULTRASOUND/IVUS N/A 11/20/2016   Procedure: Intravascular Ultrasound/IVUS;  Surgeon: Nelva Bush, MD;  Location: [place] [facility];  Service: Cardiovascular;  Laterality: N/A;  . LEFT HEART CATH AND CORONARY ANGIOGRAPHY N/A 11/20/2016   Procedure: LEFT HEART CATH AND CORONARY  ANGIOGRAPHY;  Surgeon: Nelva Bush, MD;  Location: [place] [facility];  Service: Cardiovascular;  Laterality: N/A;  . TONSILLECTOMY      Current Medications: Current Meds  Medication Sig  . aspirin EC 81 MG tablet Take 1 tablet (81 mg total) by mouth daily.  . clopidogrel (PLAVIX) 75 MG tablet Take 1 tablet (75 mg total) by mouth daily with breakfast.  . isosorbide mononitrate (IMDUR) 30 MG 24 hr tablet Take 1 tablet (30 mg total) by mouth daily.  . metoprolol tartrate (LOPRESSOR) 25 MG tablet Take 1 tablet (25 mg total) by mouth 2 (two) times daily.  . nitroGLYCERIN (NITROSTAT) 0.4 MG SL tablet Place 1 tablet (0.4 mg total) under the tongue every 5 (five) minutes as needed for chest pain.  . rosuvastatin (CRESTOR) 40 MG tablet Take 1 tablet (40 mg total) by mouth daily.     Allergies:   Penicillin g   Social History   Social History  . Marital status: Married    Spouse name: N/A  . Number of children: N/A  . Years of education: N/A   Social History Main Topics  . Smoking status: Former Smoker    Packs/day: 1.00    Years: 35.00    Types: Cigarettes    Quit date: 02/06/2001  . Smokeless tobacco: Never Used  . Alcohol use 9.0 oz/week    15 Cans of beer per week  . Drug use: No  . Sexual activity:  Not Currently   Other Topics Concern  . None   Social History Narrative  . None     Family History: The patient's family history includes Cancer in his father. ROS:   Please see the history of present illness.    All 14 point review of systems negative except as described per history of present illness  EKGs/Labs/Other Studies Reviewed:      Recent Labs: 11/21/2016: BUN 10; Creatinine, Ser 1.17; Hemoglobin 12.0; Platelets 177; Potassium 4.1; Sodium 140  Recent Lipid Panel No results found for: CHOL, TRIG, HDL, CHOLHDL, VLDL, LDLCALC, LDLDIRECT  Physical Exam:    VS:  BP 120/60   Pulse 80   Resp 14   Ht 5\' 5"  (1.651 m)   Wt 199 lb 6.4 oz (90.4 kg)   BMI  33.18 kg/m     Wt Readings from Last 3 Encounters:  12/15/16 199 lb 6.4 oz (90.4 kg)  11/21/16 209 lb 14.1 oz (95.2 kg)  11/17/16 199 lb 12.8 oz (90.6 kg)     GEN:  Well nourished, well developed in no acute distress HEENT: Normal NECK: No JVD; No carotid bruits LYMPHATICS: No lymphadenopathy CARDIAC: RRR, no murmurs, no rubs, no gallops RESPIRATORY:  Clear to auscultation without rales, wheezing or rhonchi  ABDOMEN: Soft, non-tender, non-distended MUSCULOSKELETAL:  No edema; No deformity  SKIN: Warm and dry LOWER EXTREMITIES: no swelling NEUROLOGIC:  Alert and oriented x 3 PSYCHIATRIC:  Normal affect   ASSESSMENT:    1. Dyslipidemia   2. Coronary artery disease involving native coronary artery of native heart without angina pectoris   3. Obstructive sleep apnea   4. Shortness of breath    PLAN:    In order of problems listed above:  1. Dyslipidemia: We'll continue Crestor, does have some muscle aches. Advised to start coenzyme Q10. 2. Coronary arte: Status post PTCA and stenting with drug-eluting stent to left anterior descending artery discuss cardiac catheterization as well as all medications which will continue. He'll require dual antiplatelet agents for at least 6 months 3. Obstructive sleep apnea: We'll continue this discussion still reluctant to be tested for it. 4. Shortness of brth: Unchanged.y with improicular ejection fraction he will do better. Yesterday all day he was working in the garden no much difficult.   Medication Adjustments/Labs and Tests Ordered: Current medicines are reviewed at length with the patient today.  Concerns regarding medicines are outlined above.  No orders of the defined types were placed in this encounter.  Medication changes: No orders of the defined types were placed in this encounter.   Signed, Park Liter, MD, [facility] 12/15/2016 8:33 AM    [place]

## 2016-12-16 LAB — BASIC METABOLIC PANEL
BUN/Creatinine Ratio: 10 (ref 10–24)
BUN: 11 mg/dL (ref 8–27)
CALCIUM: 9.8 mg/dL (ref 8.6–10.2)
CO2: 24 mmol/L (ref 20–29)
Chloride: 97 mmol/L (ref 96–106)
Creatinine, Ser: 1.08 mg/dL (ref 0.76–1.27)
GFR, EST AFRICAN AMERICAN: 83 mL/min/{1.73_m2} (ref >59)
GFR, EST NON AFRICAN AMERICAN: 72 mL/min/{1.73_m2} (ref >59)
Glucose: 132 mg/dL — ABNORMAL HIGH (ref 65–99)
POTASSIUM: 4.2 mmol/L (ref 3.5–5.2)
Sodium: 137 mmol/L (ref 134–144)

## 2016-12-17 ENCOUNTER — Telehealth: Payer: Self-pay

## 2016-12-17 ENCOUNTER — Other Ambulatory Visit: Payer: Self-pay

## 2016-12-17 DIAGNOSIS — Z79899 Other long term (current) drug therapy: Secondary | ICD-10-CM

## 2016-12-17 MED ORDER — LISINOPRIL 2.5 MG PO TABS: 2.5000 mg | ORAL_TABLET | Freq: Every day | ORAL | 11 refills | Status: DC

## 2016-12-17 NOTE — Telephone Encounter (Signed)
-----   Message from Park Liter, MD sent at 12/17/2016  1:19 PM EDT ----- Start lisinopril 2.5 mg po qd  chem7 in 1 week

## 2016-12-17 NOTE — Telephone Encounter (Signed)
Pt educated regarding results and med start. [facility] will go to [facility] in [place] in 1 week. Order placed.

## 2017-01-14 ENCOUNTER — Encounter: Payer: Self-pay | Admitting: Cardiology

## 2017-01-14 ENCOUNTER — Ambulatory Visit (INDEPENDENT_AMBULATORY_CARE_PROVIDER_SITE_OTHER): Payer: PPO | Admitting: Cardiology

## 2017-01-14 VITALS — BP 110/68 | HR 60 | Resp 10 | Ht 65.0 in | Wt 199.0 lb

## 2017-01-14 DIAGNOSIS — E785 Hyperlipidemia, unspecified: Secondary | ICD-10-CM | POA: Diagnosis not present

## 2017-01-14 DIAGNOSIS — R0602 Shortness of breath: Secondary | ICD-10-CM

## 2017-01-14 DIAGNOSIS — I2510 Atherosclerotic heart disease of native coronary artery without angina pectoris: Secondary | ICD-10-CM

## 2017-01-14 NOTE — Progress Notes (Signed)
Cardiology Office Note:    Date:  01/14/2017   ID:  Robert Ramos, DOB Apr 29, 1950, MRN [id number]  PCP:  [name], MD  Cardiologist:  Jenne Campus, MD    Referring MD: [name], MD   Chief Complaint  Patient presents with  . 1 month follow up  Feeling great from heart point of view  History of Present Illness:    Robert Ramos is a 66 y.o. male  with coronary artery disease, status post PTCA and stenting few months ago. Comes for follow up cardiac wise doing great, denies having any chest pain tightness squeezing pressure burning in the chest. Plan he has this pain in multiple joints. Increased dose of his Tylenol however his liver function test became abnormal. I advised him to reduce dose of Tylenol and told him that he can use sporadically ibuprofen. I told him not to use more than 2 tablets a day. Also suggested some ointment to see if that helps.  Past Medical History:  Diagnosis Date  . Arthritis    "hands, back" (11/20/2016)  . Coronary artery disease   . Heart attack ([place]) 2002  . History of gout   . History of kidney stones   . Hyperlipidemia   . Hypertension     Past Surgical History:  Procedure Laterality Date  . APPENDECTOMY  1974  . BACK SURGERY    . CORONARY ANGIOPLASTY WITH STENT PLACEMENT  2002; 11/20/2016   "@ [place]; @ [facility]"  . CORONARY STENT INTERVENTION N/A 11/20/2016   Procedure: CORONARY STENT INTERVENTION;  Surgeon: Nelva Bush, MD;  Location: [place] [facility];  Service: Cardiovascular;  Laterality: N/A;  . HEMI-MICRODISCECTOMY LUMBAR LAMINECTOMY LEVEL 1 Left 2008   L4  . INTRAVASCULAR ULTRASOUND/IVUS N/A 11/20/2016   Procedure: Intravascular Ultrasound/IVUS;  Surgeon: Nelva Bush, MD;  Location: [place] [facility];  Service: Cardiovascular;  Laterality: N/A;  . LEFT HEART CATH AND CORONARY ANGIOGRAPHY N/A 11/20/2016   Procedure: LEFT HEART CATH AND CORONARY ANGIOGRAPHY;  Surgeon: Nelva Bush, MD;   Location: [place] [facility];  Service: Cardiovascular;  Laterality: N/A;  . TONSILLECTOMY      Current Medications: Current Meds  Medication Sig  . aspirin EC 81 MG tablet Take 1 tablet (81 mg total) by mouth daily.  . clopidogrel (PLAVIX) 75 MG tablet Take 1 tablet (75 mg total) by mouth daily with breakfast.  . Coenzyme Q10 (CO Q 10 PO) Take 625 mg by mouth 4 (four) times daily.  [name] lisinopril (PRINIVIL,ZESTRIL) 2.5 MG tablet Take 1 tablet (2.5 mg total) by mouth daily.  . metoprolol tartrate (LOPRESSOR) 25 MG tablet Take 1 tablet (25 mg total) by mouth 2 (two) times daily.  . nitroGLYCERIN (NITROSTAT) 0.4 MG SL tablet Place 1 tablet (0.4 mg total) under the tongue every 5 (five) minutes as needed for chest pain.  . rosuvastatin (CRESTOR) 40 MG tablet Take 1 tablet (40 mg total) by mouth daily.     Allergies:   Penicillin g   Social History   Social History  . Marital status: Married    Spouse name: N/A  . Number of children: N/A  . Years of education: N/A   Social History Main Topics  . Smoking status: Former Smoker    Packs/day: 1.00    Years: 35.00    Types: Cigarettes    Quit date: 02/06/2001  . Smokeless tobacco: Never Used  . Alcohol use 9.0 oz/week    15 Cans  of beer per week  . Drug use: No  . Sexual activity: Not Currently   Other Topics Concern  . None   Social History Narrative  . None     Family History: The patient's family history includes Cancer in his father. ROS:   Please see the history of present illness.    All 14 point review of systems negative except as described per history of present illness  EKGs/Labs/Other Studies Reviewed:      Recent Labs: 11/21/2016: Hemoglobin 12.0; Platelets 177 12/15/2016: BUN 11; Creatinine, Ser 1.08; Potassium 4.2; Sodium 137  Recent Lipid Panel No results found for: CHOL, TRIG, HDL, CHOLHDL, VLDL, LDLCALC, LDLDIRECT  Physical Exam:    VS:  BP 110/68   Pulse 60   Resp 10   Ht 5\' 5"  (1.651 m)   Wt 199  lb (90.3 kg)   BMI 33.12 kg/m     Wt Readings from Last 3 Encounters:  01/14/17 199 lb (90.3 kg)  12/15/16 199 lb 6.4 oz (90.4 kg)  11/21/16 209 lb 14.1 oz (95.2 kg)     GEN:  Well nourished, well developed in no acute distress HEENT: Normal NECK: No JVD; No carotid bruits LYMPHATICS: No lymphadenopathy CARDIAC: RRR, no murmurs, no rubs, no gallops RESPIRATORY:  Clear to auscultation without rales, wheezing or rhonchi  ABDOMEN: Soft, non-tender, non-distended MUSCULOSKELETAL:  No edema; No deformity  SKIN: Warm and dry LOWER EXTREMITIES: no swelling NEUROLOGIC:  Alert and oriented x 3 PSYCHIATRIC:  Normal affect   ASSESSMENT:    1. Coronary artery disease involving native coronary artery of native heart without angina pectoris   2. Dyslipidemia   3. Shortness of breath    PLAN:    In order of problems listed above:  1. Coronary artery disease: Stable and appropriate medications which I will continue. 2. Dyslipidemia: I advised him to continue using Crestor in spite of elevation of liver function tests. Those are not particularly elevated but clearly need to be followed. He does have some elevation of triglycerides and he may have fatty liver. I advised him to avoid simple carbohydrates and continue exercising will recheck he is liver function tests within next month. I also told him to reduce dose of Tylenol. 3. Shortness of breath: Denies having any.  Overall he is doing well I'll continue present management with the remarks as above   Medication Adjustments/Labs and Tests Ordered: Current medicines are reviewed at length with the patient today.  Concerns regarding medicines are outlined above.  No orders of the defined types were placed in this encounter.  Medication changes: No orders of the defined types were placed in this encounter.   Signed, Park Liter, MD, [facility] 01/14/2017 10:45 AM    [name]

## 2017-01-14 NOTE — Patient Instructions (Addendum)
Medication Instructions:  Your physician recommends that you continue on your current medications as directed. Please refer to the Current Medication list given to you today.  Labwork: Your physician recommends that you return for lab work in: 1-3 weeks  Testing/Procedures: None   Follow-Up: Your physician recommends that you schedule a follow-up appointment in: 1 months   Any Other Special Instructions Will Be Listed Below (If Applicable).  Please note that any paperwork needing to be filled out by the provider will need to be addressed at the front desk prior to seeing the provider. Please note that any paperwork FMLA, Disability or other documents regarding health condition is subject to a $25.00 charge that must be received prior to completion of paperwork in the form of a money order or check.     If you need a refill on your cardiac medications before your next appointment, please call your pharmacy.

## 2017-01-14 NOTE — Addendum Note (Signed)
Addended by: Kathyrn Sheriff on: 01/14/2017 10:57 AM   Modules accepted: Orders

## 2017-01-21 ENCOUNTER — Other Ambulatory Visit: Payer: Self-pay | Admitting: *Deleted

## 2017-01-21 NOTE — Patient Outreach (Signed)
[facility]) Care Management  01/21/2017  Robert Ramos 1950-06-30 [id number]   Telephone screening for High Risk assessment  Placed call to patient, no answer, able to leave a HIPAA compliant message requesting a return call  Plan  Return call in the next week.    Joylene Draft, RN, [place] Management Coordinator  [phone]- Mobile [phone]- Toll Free Main Office

## 2017-01-25 ENCOUNTER — Other Ambulatory Visit: Payer: Self-pay | Admitting: *Deleted

## 2017-01-25 NOTE — Patient Outreach (Signed)
[facility]) Care Management  01/25/2017  Odus Clasby Pilgrim [birth year] [id number]  High Risk Assessment  Placed call to patient no answer, able to leave a HIPAA compliant message requesting a return call  Plan  Await return call if no response will plan return call within the week.  Joylene Draft, RN, [place] Management Coordinator  [phone]- Mobile [phone]- Toll Free Main Office

## 2017-01-28 ENCOUNTER — Other Ambulatory Visit: Payer: Self-pay | Admitting: *Deleted

## 2017-01-28 NOTE — Patient Outreach (Signed)
[facility]) Care Management  01/28/2017  Noell Shular Word 1950-10-04 [id number]  Health Risk Assessment   Placed call to patient, explained reason for the call. Patient reports he is managing well at home, he discussed his chronic medical condition of recent Heart attack and stent. Patient reports he works daily and was not able to participate in cardiac rehab. Patient monitoring his blood pressure daily at home. Patient denies cost concerns related to medication.  Patient denies any other health concerns at this time,declines additional education needs.  PCP and patient  address verified   Plan Will send successful outreach letter.   Joylene Draft, RN, [place] Management Coordinator  [phone]- Mobile [phone]- Toll Free Main Office

## 2017-01-29 ENCOUNTER — Encounter: Payer: Self-pay | Admitting: *Deleted

## 2017-04-21 ENCOUNTER — Ambulatory Visit (INDEPENDENT_AMBULATORY_CARE_PROVIDER_SITE_OTHER): Payer: PPO | Admitting: Cardiology

## 2017-04-21 ENCOUNTER — Encounter: Payer: Self-pay | Admitting: Cardiology

## 2017-04-21 VITALS — BP 110/68 | HR 75 | Ht 65.0 in | Wt 201.0 lb

## 2017-04-21 DIAGNOSIS — G4733 Obstructive sleep apnea (adult) (pediatric): Secondary | ICD-10-CM | POA: Diagnosis not present

## 2017-04-21 DIAGNOSIS — E785 Hyperlipidemia, unspecified: Secondary | ICD-10-CM | POA: Diagnosis not present

## 2017-04-21 DIAGNOSIS — I2510 Atherosclerotic heart disease of native coronary artery without angina pectoris: Secondary | ICD-10-CM | POA: Diagnosis not present

## 2017-04-21 NOTE — Patient Instructions (Addendum)
Medication Instructions:  Your physician recommends that you continue on your current medications as directed. Please refer to the Current Medication list given to you today.  Labwork: Your physician recommends that you return for lab work: Orders for a Lipid panel, AST and ALT will be given to you to take to your primary care   Testing/Procedures: EKG today  You will also be contacted about setting up a sleep study  Follow-Up: Your physician recommends that you schedule a follow-up appointment in: 6 months with Dr. Agustin Cree in [place]   Any Other Special Instructions Will Be Listed Below (If Applicable).     If you need a refill on your cardiac medications before your next appointment, please call your pharmacy.

## 2017-04-21 NOTE — Progress Notes (Signed)
Cardiology Office Note:    Date:  04/21/2017   ID:  Audrie Lia Jablon, DOB Dec 09, 1950, MRN [id number]  PCP:  [name], MD  Cardiologist:  Jenne Campus, MD    Referring MD: [name], MD   Chief Complaint  Patient presents with  . Follow-up  Doing well  History of Present Illness:    Robert Ramos is a 67 y.o. male with coronary artery disease.  In August he got quite complex intervention since that time he seems to be doing well.  Described to have however one episode when he wakes up in the middle of the night with his heart speeding up.  He is wife telling me that he snores a lot and he stopped breathing at night I strongly suspect sleep apnea.  Finally he agreed to have the test done.  We will schedule him to have a sleep apnea.  Described to have heartburn it happens after he eats certain type of food especially when he bends a lot.  It is not related to exercise.  His ability to exercise is limited because of chronic back problems.  In the matter-of-fact we had a discussion today about potentially having surgery in his back which I advised against until at least 6 months after coronary intervention  Past Medical History:  Diagnosis Date  . Arthritis    "hands, back" (11/20/2016)  . Coronary artery disease   . Heart attack ([place]) 2002  . History of gout   . History of kidney stones   . Hyperlipidemia   . Hypertension     Past Surgical History:  Procedure Laterality Date  . APPENDECTOMY  1974  . BACK SURGERY    . CORONARY ANGIOPLASTY WITH STENT PLACEMENT  2002; 11/20/2016   "@ [place]; @ [facility]"  . CORONARY STENT INTERVENTION N/A 11/20/2016   Procedure: CORONARY STENT INTERVENTION;  Surgeon: Nelva Bush, MD;  Location: [place] [facility];  Service: Cardiovascular;  Laterality: N/A;  . HEMI-MICRODISCECTOMY LUMBAR LAMINECTOMY LEVEL 1 Left 2008   L4  . INTRAVASCULAR ULTRASOUND/IVUS N/A 11/20/2016   Procedure: Intravascular Ultrasound/IVUS;   Surgeon: Nelva Bush, MD;  Location: [place] [facility];  Service: Cardiovascular;  Laterality: N/A;  . LEFT HEART CATH AND CORONARY ANGIOGRAPHY N/A 11/20/2016   Procedure: LEFT HEART CATH AND CORONARY ANGIOGRAPHY;  Surgeon: Nelva Bush, MD;  Location: [place] [facility];  Service: Cardiovascular;  Laterality: N/A;  . TONSILLECTOMY      Current Medications: Current Meds  Medication Sig  . aspirin EC 81 MG tablet Take 1 tablet (81 mg total) by mouth daily.  . clopidogrel (PLAVIX) 75 MG tablet Take 1 tablet (75 mg total) by mouth daily with breakfast.  . Coenzyme Q10 (CO Q 10 PO) Take 625 mg by mouth 4 (four) times daily.  [name] lisinopril (PRINIVIL,ZESTRIL) 2.5 MG tablet Take 1 tablet (2.5 mg total) by mouth daily.  . metoprolol tartrate (LOPRESSOR) 25 MG tablet Take 1 tablet (25 mg total) by mouth 2 (two) times daily.  . nitroGLYCERIN (NITROSTAT) 0.4 MG SL tablet Place 1 tablet (0.4 mg total) under the tongue every 5 (five) minutes as needed for chest pain.  . rosuvastatin (CRESTOR) 40 MG tablet Take 1 tablet (40 mg total) by mouth daily.     Allergies:   Penicillin g   Social History   Socioeconomic History  . Marital status: Married    Spouse name: None  . Number of children: None  . Years of  education: None  . Highest education level: None  Social Needs  . Financial resource strain: None  . Food insecurity - worry: None  . Food insecurity - inability: None  . Transportation needs - medical: None  . Transportation needs - non-medical: None  Occupational History  . None  Tobacco Use  . Smoking status: Former Smoker    Packs/day: 1.00    Years: 35.00    Pack years: 35.00    Types: Cigarettes    Last attempt to quit: 02/06/2001    Years since quitting: 16.2  . Smokeless tobacco: Never Used  Substance and Sexual Activity  . Alcohol use: Yes    Alcohol/week: 9.0 oz    Types: 15 Cans of beer per week  . Drug use: No  . Sexual activity: Not Currently  Other Topics  Concern  . None  Social History Narrative  . None     Family History: The patient's family history includes Cancer in his father. ROS:   Please see the history of present illness.    All 14 point review of systems negative except as described per history of present illness  EKGs/Labs/Other Studies Reviewed:      Recent Labs: 11/21/2016: Hemoglobin 12.0; Platelets 177 12/15/2016: BUN 11; Creatinine, Ser 1.08; Potassium 4.2; Sodium 137  Recent Lipid Panel No results found for: CHOL, TRIG, HDL, CHOLHDL, VLDL, LDLCALC, LDLDIRECT  Physical Exam:    VS:  BP 110/68   Pulse 75   Ht 5\' 5"  (1.651 m)   Wt 201 lb (91.2 kg)   SpO2 95%   BMI 33.45 kg/m     Wt Readings from Last 3 Encounters:  04/21/17 201 lb (91.2 kg)  01/14/17 199 lb (90.3 kg)  12/15/16 199 lb 6.4 oz (90.4 kg)     GEN:  Well nourished, well developed in no acute distress HEENT: Normal NECK: No JVD; No carotid bruits LYMPHATICS: No lymphadenopathy CARDIAC: RRR, no murmurs, no rubs, no gallops RESPIRATORY:  Clear to auscultation without rales, wheezing or rhonchi  ABDOMEN: Soft, non-tender, non-distended MUSCULOSKELETAL:  No edema; No deformity  SKIN: Warm and dry LOWER EXTREMITIES: no swelling NEUROLOGIC:  Alert and oriented x 3 PSYCHIATRIC:  Normal affect   ASSESSMENT:    1. Coronary artery disease involving native coronary artery of native heart without angina pectoris   2. Dyslipidemia   3. Obstructive sleep apnea    PLAN:    In order of problems listed above:  1. Coronary artery disease: Doing well from that point if he does have some what he describes heartburn which truly look like gastroesophageal reflux disease.  It helped with antiacids: Related to certain type of food: Not related to exercise.  I will ask him to have a EKG make sure there is no coronary events since last time I seen him. 2. His lipidemia: Sadly he had his breakfast this morning.  We will make arrangements for him to have his  cholesterol liver function test checked in his primary MD office. 3. Reactive sleep apnea: Finally agreed to have a sleep study will make arrangements for that.  We will see him back in my office in about 6 months.  I told him if he will see a neurosurgeon and only injections is necessary 6 months after intervention we can interrupt dual antiplatelet agents for about a week.  If surgery is necessary I will need to see him before it.   Medication Adjustments/Labs and Tests Ordered: Current medicines are reviewed at length  with the patient today.  Concerns regarding medicines are outlined above.  No orders of the defined types were placed in this encounter.  Medication changes: No orders of the defined types were placed in this encounter.   Signed, Park Liter, MD, [facility] 04/21/2017 11:08 AM    [place]

## 2017-04-30 DIAGNOSIS — E785 Hyperlipidemia, unspecified: Secondary | ICD-10-CM | POA: Diagnosis not present

## 2017-05-01 LAB — LIPID PANEL
CHOLESTEROL TOTAL: 116 mg/dL (ref 100–199)
Chol/HDL Ratio: 3.5 ratio (ref 0.0–5.0)
HDL: 33 mg/dL — ABNORMAL LOW (ref >39)
LDL CALC: 54 mg/dL (ref 0–99)
TRIGLYCERIDES: 143 mg/dL (ref 0–149)
VLDL Cholesterol Cal: 29 mg/dL (ref 5–40)

## 2017-05-01 LAB — ALT: ALT: 21 IU/L (ref 0–44)

## 2017-05-01 LAB — AST: AST: 20 IU/L (ref 0–40)

## 2017-05-19 DIAGNOSIS — E559 Vitamin D deficiency, unspecified: Secondary | ICD-10-CM | POA: Diagnosis not present

## 2017-05-19 DIAGNOSIS — R5383 Other fatigue: Secondary | ICD-10-CM | POA: Diagnosis not present

## 2017-05-19 DIAGNOSIS — G4733 Obstructive sleep apnea (adult) (pediatric): Secondary | ICD-10-CM | POA: Diagnosis not present

## 2017-05-19 DIAGNOSIS — R0902 Hypoxemia: Secondary | ICD-10-CM | POA: Diagnosis not present

## 2017-06-13 DIAGNOSIS — G4733 Obstructive sleep apnea (adult) (pediatric): Secondary | ICD-10-CM | POA: Diagnosis not present

## 2017-06-18 DIAGNOSIS — R5383 Other fatigue: Secondary | ICD-10-CM | POA: Diagnosis not present

## 2017-06-18 DIAGNOSIS — G4761 Periodic limb movement disorder: Secondary | ICD-10-CM | POA: Diagnosis not present

## 2017-06-18 DIAGNOSIS — E559 Vitamin D deficiency, unspecified: Secondary | ICD-10-CM | POA: Diagnosis not present

## 2017-06-18 DIAGNOSIS — G4733 Obstructive sleep apnea (adult) (pediatric): Secondary | ICD-10-CM | POA: Diagnosis not present

## 2017-08-30 ENCOUNTER — Other Ambulatory Visit: Payer: Self-pay | Admitting: *Deleted

## 2017-08-30 MED ORDER — CLOPIDOGREL BISULFATE 75 MG PO TABS: 75.0000 mg | ORAL_TABLET | Freq: Every day | ORAL | 2 refills | Status: DC

## 2017-08-30 NOTE — Telephone Encounter (Signed)
Refill sent to [facility] in [place].

## 2017-08-31 DIAGNOSIS — M5416 Radiculopathy, lumbar region: Secondary | ICD-10-CM | POA: Diagnosis not present

## 2017-09-07 ENCOUNTER — Other Ambulatory Visit: Payer: Self-pay

## 2017-09-07 ENCOUNTER — Encounter (HOSPITAL_COMMUNITY): Payer: Self-pay | Admitting: Emergency Medicine

## 2017-09-07 ENCOUNTER — Emergency Department (HOSPITAL_COMMUNITY)
Admission: EM | Admit: 2017-09-07 | Discharge: 2017-09-08 | Disposition: A | Discharge status: Home / Self Care | Payer: PPO | Source: Home / Self Care

## 2017-09-07 ENCOUNTER — Emergency Department (HOSPITAL_COMMUNITY): Payer: PPO

## 2017-09-07 DIAGNOSIS — E782 Mixed hyperlipidemia: Secondary | ICD-10-CM | POA: Diagnosis not present

## 2017-09-07 DIAGNOSIS — Y831 Surgical operation with implant of artificial internal device as the cause of abnormal reaction of the patient, or of later complication, without mention of misadventure at the time of the procedure: Secondary | ICD-10-CM | POA: Diagnosis present

## 2017-09-07 DIAGNOSIS — Z5321 Procedure and treatment not carried out due to patient leaving prior to being seen by health care provider: Secondary | ICD-10-CM

## 2017-09-07 DIAGNOSIS — I351 Nonrheumatic aortic (valve) insufficiency: Secondary | ICD-10-CM | POA: Diagnosis not present

## 2017-09-07 DIAGNOSIS — R739 Hyperglycemia, unspecified: Secondary | ICD-10-CM | POA: Diagnosis present

## 2017-09-07 DIAGNOSIS — I10 Essential (primary) hypertension: Secondary | ICD-10-CM | POA: Diagnosis present

## 2017-09-07 DIAGNOSIS — I252 Old myocardial infarction: Secondary | ICD-10-CM | POA: Diagnosis not present

## 2017-09-07 DIAGNOSIS — I361 Nonrheumatic tricuspid (valve) insufficiency: Secondary | ICD-10-CM | POA: Diagnosis not present

## 2017-09-07 DIAGNOSIS — R079 Chest pain, unspecified: Secondary | ICD-10-CM | POA: Diagnosis present

## 2017-09-07 DIAGNOSIS — Z7982 Long term (current) use of aspirin: Secondary | ICD-10-CM | POA: Diagnosis not present

## 2017-09-07 DIAGNOSIS — R0602 Shortness of breath: Secondary | ICD-10-CM | POA: Diagnosis not present

## 2017-09-07 DIAGNOSIS — Z87891 Personal history of nicotine dependence: Secondary | ICD-10-CM | POA: Diagnosis not present

## 2017-09-07 DIAGNOSIS — I200 Unstable angina: Secondary | ICD-10-CM | POA: Diagnosis not present

## 2017-09-07 DIAGNOSIS — R0789 Other chest pain: Secondary | ICD-10-CM | POA: Insufficient documentation

## 2017-09-07 DIAGNOSIS — E785 Hyperlipidemia, unspecified: Secondary | ICD-10-CM | POA: Diagnosis present

## 2017-09-07 DIAGNOSIS — Z7902 Long term (current) use of antithrombotics/antiplatelets: Secondary | ICD-10-CM | POA: Diagnosis not present

## 2017-09-07 DIAGNOSIS — I25110 Atherosclerotic heart disease of native coronary artery with unstable angina pectoris: Secondary | ICD-10-CM | POA: Diagnosis present

## 2017-09-07 DIAGNOSIS — I371 Nonrheumatic pulmonary valve insufficiency: Secondary | ICD-10-CM | POA: Diagnosis not present

## 2017-09-07 DIAGNOSIS — G4733 Obstructive sleep apnea (adult) (pediatric): Secondary | ICD-10-CM | POA: Diagnosis present

## 2017-09-07 DIAGNOSIS — T82855A Stenosis of coronary artery stent, initial encounter: Secondary | ICD-10-CM | POA: Diagnosis present

## 2017-09-07 LAB — BASIC METABOLIC PANEL
Anion gap: 8 (ref 5–15)
BUN: 15 mg/dL (ref 6–20)
CALCIUM: 9.6 mg/dL (ref 8.9–10.3)
CHLORIDE: 103 mmol/L (ref 101–111)
CO2: 30 mmol/L (ref 22–32)
CREATININE: 1.27 mg/dL — AB (ref 0.61–1.24)
GFR, EST NON AFRICAN AMERICAN: 58 mL/min — AB (ref >60)
Glucose, Bld: 149 mg/dL — ABNORMAL HIGH (ref 65–99)
Potassium: 4.5 mmol/L (ref 3.5–5.1)
SODIUM: 141 mmol/L (ref 135–145)

## 2017-09-07 LAB — CBC
HCT: 38.6 % — ABNORMAL LOW (ref 39.0–52.0)
Hemoglobin: 12.7 g/dL — ABNORMAL LOW (ref 13.0–17.0)
MCH: 29.9 pg (ref 26.0–34.0)
MCHC: 32.9 g/dL (ref 30.0–36.0)
MCV: 90.8 fL (ref 78.0–100.0)
PLATELETS: 199 10*3/uL (ref 150–400)
RBC: 4.25 MIL/uL (ref 4.22–5.81)
RDW: 13.3 % (ref 11.5–15.5)
WBC: 8 10*3/uL (ref 4.0–10.5)

## 2017-09-07 LAB — I-STAT TROPONIN, ED: Troponin i, poc: 0 ng/mL (ref 0.00–0.08)

## 2017-09-07 IMAGING — CR DG CHEST 2V
2 series · 2 of 2 positions shown · non-contrast
Comparison: [DATE]

CLINICAL DATA: Chest pain, shortness of breath x1 day

EXAM:
CHEST - 2 VIEW

[chest pa]
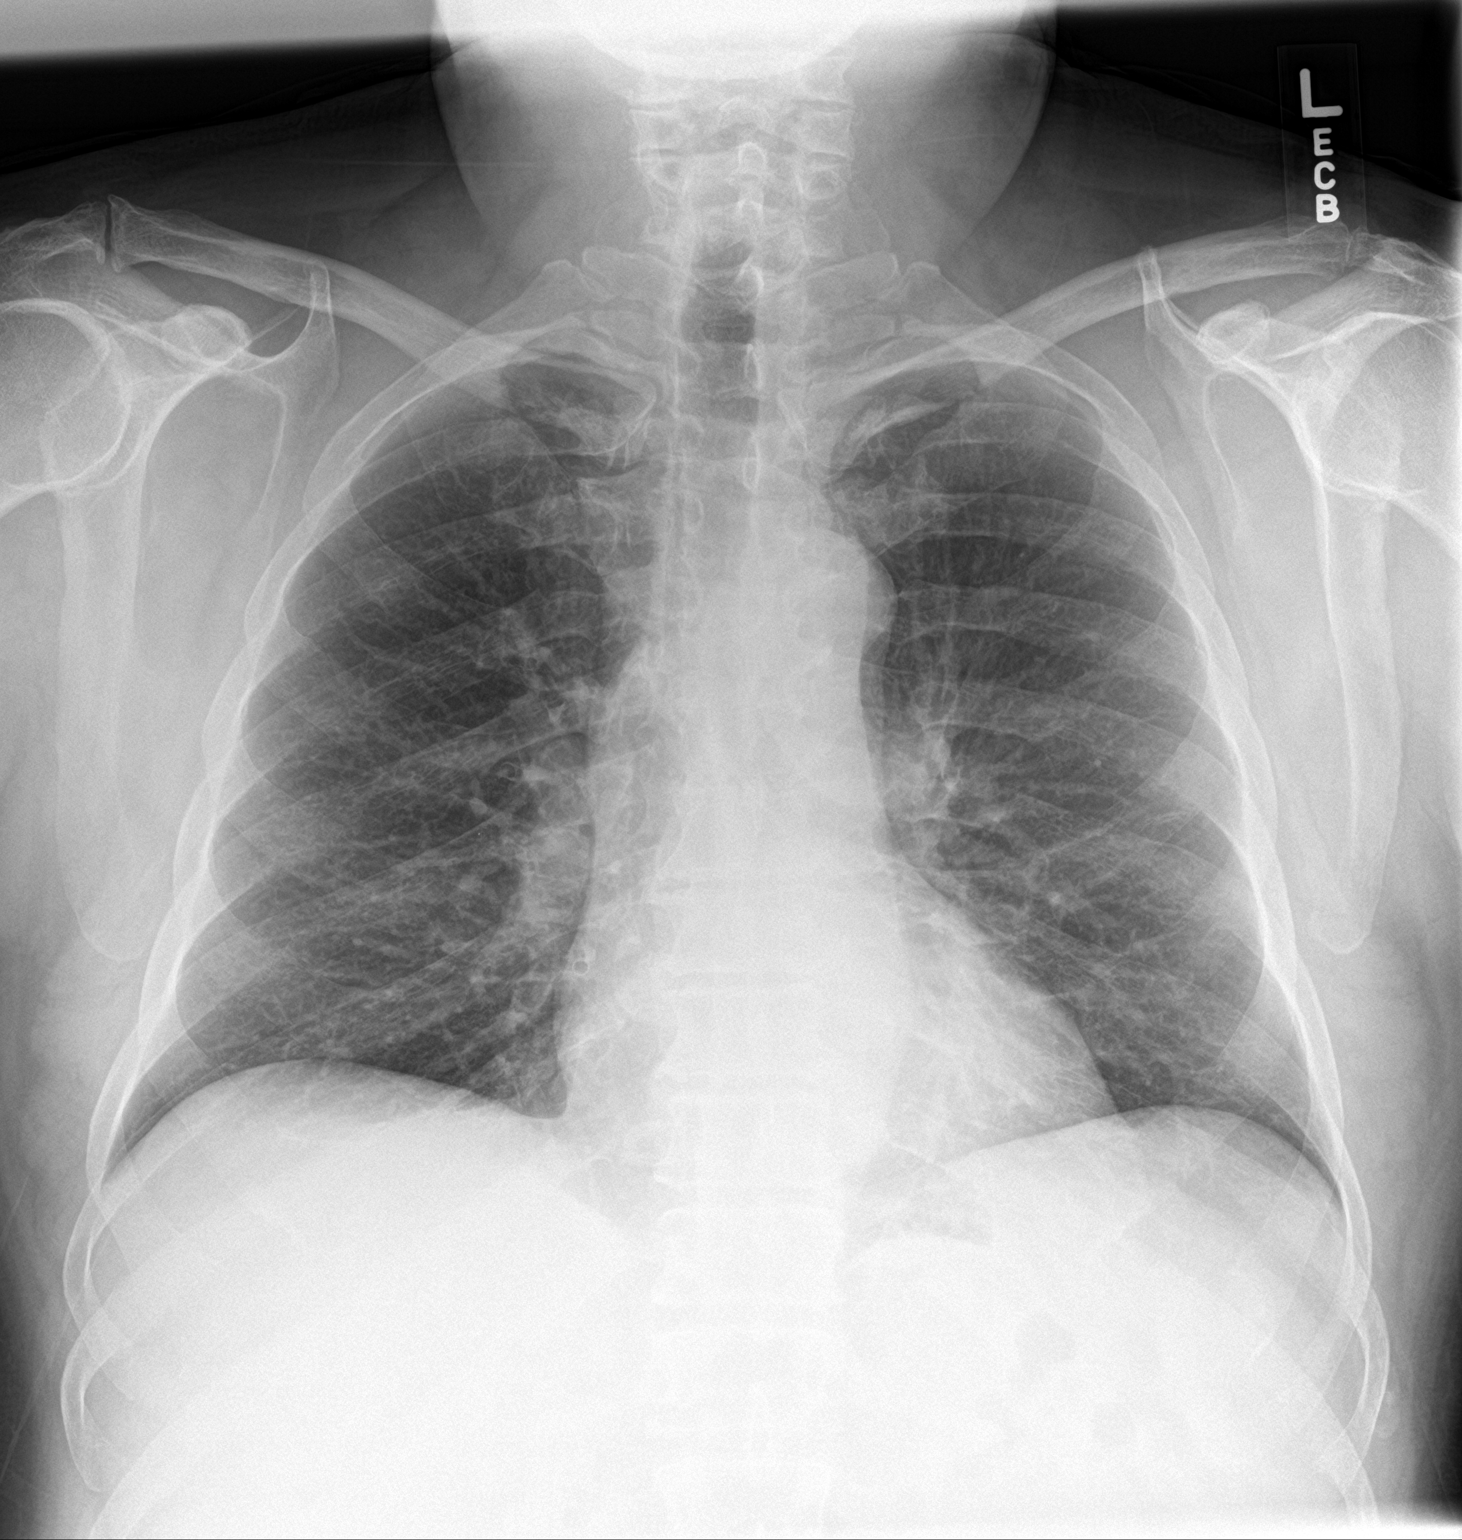

[chest lat]
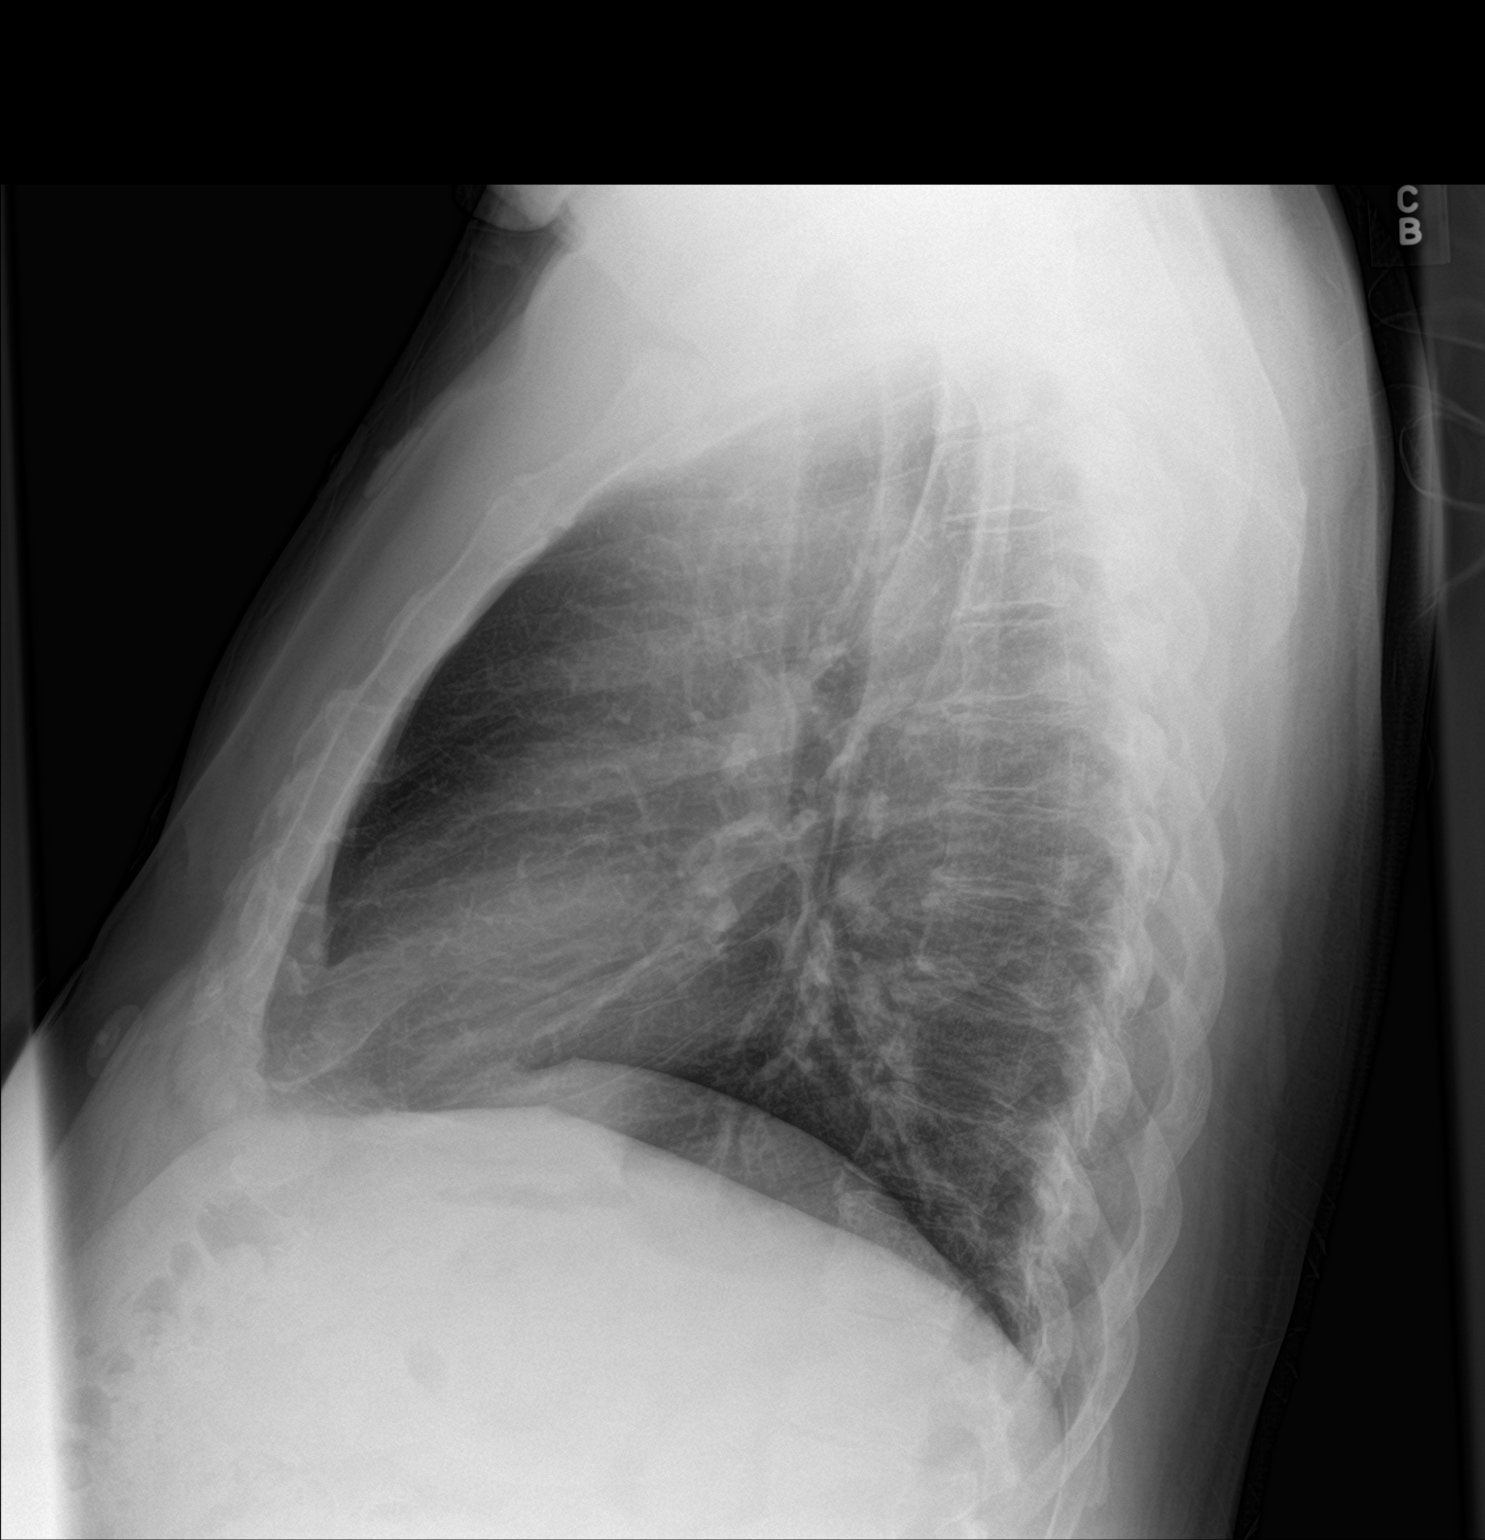

[2 of 2 positions shown; findings below may reference images not displayed]

FINDINGS: Lungs are clear.  No pleural effusion or pneumothorax.

The heart is normal in size.

Mild degenerative changes of the visualized thoracolumbar spine.
IMPRESSION: Normal chest radiographs.

## 2017-09-07 NOTE — ED Triage Notes (Signed)
Patient to ED c/o sudden onset central CP radiating to L shoulder at 8pm this evening - reports he took 1 NTG with relief (took from 9/10 to 5/10), describes as tightness and endorses SOB. Patient denies dizziness, no N/V. Resp e/u, skin warm/dry.

## 2017-09-08 ENCOUNTER — Telehealth: Payer: Self-pay | Admitting: Cardiology

## 2017-09-08 ENCOUNTER — Inpatient Hospital Stay (HOSPITAL_BASED_OUTPATIENT_CLINIC_OR_DEPARTMENT_OTHER)
Admission: EM | Admit: 2017-09-08 | Discharge: 2017-09-10 | DRG: 247 | Disposition: A | Discharge status: Home / Self Care | Payer: PPO | Attending: Interventional Cardiology | Admitting: Interventional Cardiology

## 2017-09-08 ENCOUNTER — Encounter (HOSPITAL_BASED_OUTPATIENT_CLINIC_OR_DEPARTMENT_OTHER): Payer: Self-pay | Admitting: Emergency Medicine

## 2017-09-08 ENCOUNTER — Other Ambulatory Visit: Payer: Self-pay

## 2017-09-08 DIAGNOSIS — G4733 Obstructive sleep apnea (adult) (pediatric): Secondary | ICD-10-CM | POA: Diagnosis present

## 2017-09-08 DIAGNOSIS — I351 Nonrheumatic aortic (valve) insufficiency: Secondary | ICD-10-CM | POA: Diagnosis not present

## 2017-09-08 DIAGNOSIS — R079 Chest pain, unspecified: Secondary | ICD-10-CM | POA: Diagnosis present

## 2017-09-08 DIAGNOSIS — I10 Essential (primary) hypertension: Secondary | ICD-10-CM | POA: Diagnosis present

## 2017-09-08 DIAGNOSIS — Z87891 Personal history of nicotine dependence: Secondary | ICD-10-CM

## 2017-09-08 DIAGNOSIS — I252 Old myocardial infarction: Secondary | ICD-10-CM

## 2017-09-08 DIAGNOSIS — I371 Nonrheumatic pulmonary valve insufficiency: Secondary | ICD-10-CM | POA: Diagnosis not present

## 2017-09-08 DIAGNOSIS — Y831 Surgical operation with implant of artificial internal device as the cause of abnormal reaction of the patient, or of later complication, without mention of misadventure at the time of the procedure: Secondary | ICD-10-CM | POA: Diagnosis present

## 2017-09-08 DIAGNOSIS — E782 Mixed hyperlipidemia: Secondary | ICD-10-CM

## 2017-09-08 DIAGNOSIS — Z7902 Long term (current) use of antithrombotics/antiplatelets: Secondary | ICD-10-CM

## 2017-09-08 DIAGNOSIS — Z7982 Long term (current) use of aspirin: Secondary | ICD-10-CM | POA: Diagnosis not present

## 2017-09-08 DIAGNOSIS — E785 Hyperlipidemia, unspecified: Secondary | ICD-10-CM | POA: Diagnosis present

## 2017-09-08 DIAGNOSIS — I361 Nonrheumatic tricuspid (valve) insufficiency: Secondary | ICD-10-CM | POA: Diagnosis not present

## 2017-09-08 DIAGNOSIS — Z955 Presence of coronary angioplasty implant and graft: Secondary | ICD-10-CM

## 2017-09-08 DIAGNOSIS — R739 Hyperglycemia, unspecified: Secondary | ICD-10-CM | POA: Diagnosis present

## 2017-09-08 DIAGNOSIS — I25110 Atherosclerotic heart disease of native coronary artery with unstable angina pectoris: Principal | ICD-10-CM | POA: Diagnosis present

## 2017-09-08 DIAGNOSIS — I200 Unstable angina: Secondary | ICD-10-CM | POA: Diagnosis present

## 2017-09-08 DIAGNOSIS — T82855A Stenosis of coronary artery stent, initial encounter: Secondary | ICD-10-CM | POA: Diagnosis present

## 2017-09-08 LAB — BASIC METABOLIC PANEL
Anion gap: 11 (ref 5–15)
BUN: 15 mg/dL (ref 6–20)
CALCIUM: 8.9 mg/dL (ref 8.9–10.3)
CO2: 23 mmol/L (ref 22–32)
CREATININE: 1.12 mg/dL (ref 0.61–1.24)
Chloride: 103 mmol/L (ref 101–111)
Glucose, Bld: 239 mg/dL — ABNORMAL HIGH (ref 65–99)
Potassium: 3.9 mmol/L (ref 3.5–5.1)
SODIUM: 137 mmol/L (ref 135–145)

## 2017-09-08 LAB — CBC
HCT: 38.1 % — ABNORMAL LOW (ref 39.0–52.0)
HEMATOCRIT: 38.1 % — AB (ref 39.0–52.0)
Hemoglobin: 12.9 g/dL — ABNORMAL LOW (ref 13.0–17.0)
Hemoglobin: 12.5 g/dL — ABNORMAL LOW (ref 13.0–17.0)
MCH: 30.9 pg (ref 26.0–34.0)
MCH: 30.1 pg (ref 26.0–34.0)
MCHC: 33.9 g/dL (ref 30.0–36.0)
MCHC: 32.8 g/dL (ref 30.0–36.0)
MCV: 91.4 fL (ref 78.0–100.0)
MCV: 91.8 fL (ref 78.0–100.0)
PLATELETS: 164 10*3/uL (ref 150–400)
Platelets: 157 10*3/uL (ref 150–400)
RBC: 4.17 MIL/uL — AB (ref 4.22–5.81)
RBC: 4.15 MIL/uL — AB (ref 4.22–5.81)
RDW: 13.6 % (ref 11.5–15.5)
RDW: 13.2 % (ref 11.5–15.5)
WBC: 7.1 10*3/uL (ref 4.0–10.5)
WBC: 5.6 10*3/uL (ref 4.0–10.5)

## 2017-09-08 LAB — MAGNESIUM: MAGNESIUM: 2 mg/dL (ref 1.7–2.4)

## 2017-09-08 LAB — CREATININE, SERUM: Creatinine, Ser: 1.04 mg/dL (ref 0.61–1.24)

## 2017-09-08 LAB — TSH: TSH: 1.305 u[IU]/mL (ref 0.350–4.500)

## 2017-09-08 LAB — TROPONIN I

## 2017-09-08 LAB — HEMOGLOBIN A1C
Hgb A1c MFr Bld: 6.1 % — ABNORMAL HIGH (ref 4.8–5.6)
Mean Plasma Glucose: 128.37 mg/dL

## 2017-09-08 LAB — T4, FREE: Free T4: 0.75 ng/dL — ABNORMAL LOW (ref 0.82–1.77)

## 2017-09-08 MED ORDER — SODIUM CHLORIDE 0.9 % IV SOLN
INTRAVENOUS | Status: DC
  Administered 2017-09-09: 06:00:00 via INTRAVENOUS

## 2017-09-08 MED ORDER — SODIUM CHLORIDE 0.9% FLUSH: 3.0000 mL | INTRAVENOUS | Status: DC | PRN

## 2017-09-08 MED ORDER — NITROGLYCERIN 0.4 MG SL SUBL: 0.4000 mg | SUBLINGUAL_TABLET | SUBLINGUAL | Status: DC | PRN

## 2017-09-08 MED ORDER — SODIUM CHLORIDE 0.9 % IV SOLN: 250.0000 mL | INTRAVENOUS | Status: DC | PRN

## 2017-09-08 MED ORDER — HEPARIN SODIUM (PORCINE) 5000 UNIT/ML IJ SOLN
5000.0000 [IU] | Freq: Three times a day (TID) | INTRAMUSCULAR | Status: DC
  Filled 2017-09-08: qty 1

## 2017-09-08 MED ORDER — NITROGLYCERIN 2 % TD OINT
0.5000 [in_us] | TOPICAL_OINTMENT | Freq: Three times a day (TID) | TRANSDERMAL | Status: DC
  Administered 2017-09-08 – 2017-09-09 (×2): 0.5 [in_us] via TOPICAL
  Filled 2017-09-08: qty 30

## 2017-09-08 MED ORDER — ONDANSETRON HCL 4 MG/2ML IJ SOLN: 4.0000 mg | Freq: Four times a day (QID) | INTRAMUSCULAR | Status: DC | PRN

## 2017-09-08 MED ORDER — LISINOPRIL 5 MG PO TABS
2.5000 mg | ORAL_TABLET | Freq: Every day | ORAL | Status: DC
  Administered 2017-09-09 – 2017-09-10 (×2): 2.5 mg via ORAL
  Filled 2017-09-08 (×3): qty 1

## 2017-09-08 MED ORDER — NITROGLYCERIN 0.4 MG SL SUBL
0.4000 mg | SUBLINGUAL_TABLET | SUBLINGUAL | Status: DC | PRN
  Administered 2017-09-10: 0.4 mg via SUBLINGUAL
  Filled 2017-09-08: qty 1

## 2017-09-08 MED ORDER — SODIUM CHLORIDE 0.9% FLUSH
3.0000 mL | Freq: Two times a day (BID) | INTRAVENOUS | Status: DC
  Administered 2017-09-08: 3 mL via INTRAVENOUS

## 2017-09-08 MED ORDER — ASPIRIN 81 MG PO CHEW
81.0000 mg | CHEWABLE_TABLET | ORAL | Status: AC
  Administered 2017-09-09: 81 mg via ORAL
  Filled 2017-09-08: qty 1

## 2017-09-08 MED ORDER — CLOPIDOGREL BISULFATE 75 MG PO TABS
75.0000 mg | ORAL_TABLET | Freq: Every day | ORAL | Status: DC
  Administered 2017-09-10: 10:00:00 75 mg via ORAL
  Filled 2017-09-08 (×2): qty 1

## 2017-09-08 MED ORDER — ASPIRIN 81 MG PO CHEW
243.0000 mg | CHEWABLE_TABLET | Freq: Once | ORAL | Status: AC
  Administered 2017-09-08: 243 mg via ORAL
  Filled 2017-09-08: qty 3

## 2017-09-08 MED ORDER — ALPRAZOLAM 0.25 MG PO TABS
0.2500 mg | ORAL_TABLET | Freq: Two times a day (BID) | ORAL | Status: DC | PRN
  Administered 2017-09-09: 0.25 mg via ORAL
  Filled 2017-09-08: qty 1

## 2017-09-08 MED ORDER — CO Q 10 60 MG PO CAPS: 625.0000 mg | ORAL_CAPSULE | Freq: Four times a day (QID) | ORAL | Status: DC

## 2017-09-08 MED ORDER — ROSUVASTATIN CALCIUM 20 MG PO TABS
40.0000 mg | ORAL_TABLET | Freq: Every day | ORAL | Status: DC
  Administered 2017-09-09 – 2017-09-10 (×2): 40 mg via ORAL
  Filled 2017-09-08 (×3): qty 2

## 2017-09-08 MED ORDER — ASPIRIN EC 81 MG PO TBEC
81.0000 mg | DELAYED_RELEASE_TABLET | Freq: Every day | ORAL | Status: DC
  Administered 2017-09-10: 10:00:00 81 mg via ORAL
  Filled 2017-09-08 (×2): qty 1

## 2017-09-08 MED ORDER — METOPROLOL TARTRATE 25 MG PO TABS
25.0000 mg | ORAL_TABLET | Freq: Two times a day (BID) | ORAL | Status: DC
  Administered 2017-09-08 – 2017-09-10 (×4): 25 mg via ORAL
  Filled 2017-09-08 (×5): qty 1

## 2017-09-08 MED ORDER — ASPIRIN EC 81 MG PO TBEC: 81.0000 mg | DELAYED_RELEASE_TABLET | Freq: Every day | ORAL | Status: DC

## 2017-09-08 MED ORDER — ACETAMINOPHEN 325 MG PO TABS
650.0000 mg | ORAL_TABLET | ORAL | Status: DC | PRN
  Administered 2017-09-09: 650 mg via ORAL
  Filled 2017-09-08: qty 2

## 2017-09-08 NOTE — Progress Notes (Signed)
PHARMACIST - PHYSICIAN ORDER COMMUNICATION  CONCERNING: P&T Medication Policy on Herbal Medications  DESCRIPTION:  This patient's order for:  Co Q 10  has been noted.  This product(s) is classified as an "herbal" or natural product. Due to a lack of definitive safety studies or FDA approval, nonstandard manufacturing practices, plus the potential risk of unknown drug-drug interactions while on inpatient medications, the Pharmacy and Therapeutics Committee does not permit the use of "herbal" or natural products of this type within [facility].   ACTION TAKEN: The pharmacy department is unable to verify this order at this time and your patient has been informed of this safety policy. Please reevaluate patient's clinical condition at discharge and address if the herbal or natural product(s) should be resumed at that time.   Julionna Marczak A. Levada Dy, PharmD, [place] Pager: [phone]

## 2017-09-08 NOTE — H&P (Addendum)
Cardiology Admission History and Physical:   Patient ID: Robert Ramos; MRN: [id number]; DOB: 05/14/1950   Admission date: 09/08/2017  Primary Care Provider: [name], MD Primary Cardiologist: Jenne Campus, MD  Primary Electrophysiologist:  NA  Chief Complaint:  Chest pain  Patient Profile:   Robert Ramos is a 67 y.o. male with a history of CAD with complex PCI in 11/2016, MI in 2002, HLD, HTN and gout now presents with chest pain.      History of Present Illness:   Robert Ramos has a history of CAD with complex PCI in 11/2016, MI in 2002, HLD, HTN and gout.  Last cath 11/2017 with patent previous stent to [facility] with mild to moderate in-stent restenosis, and LAD disease receiving DES to prox and mid LAD.  He has been on ASA and plavix.  Pt had a sleep study but no cpap recommended, only meds .   He presented to [facility] ED last evening after developing chest pain.  Pt has had chest pressure with exertion for last week. He took 1 NTG with relief.  He has not had any further pain at rest.  He was walking up a hill last pm.    EKG SR normal EKG I personally reviewed.   Troponin 0.00 and < 0.03 Na 137, K+ 3.9, glucose 239, Cr 1.12 Hgb 12.9 WBC 7.1, plts 164 CXR normal.   Currently resting comfortably.  He does have rapid HR that wakes him from sleep.  He does not sleep well, has chronic back pain and ws to see Dr. Vertell Limber for possible surgery.      Past Medical History:  Diagnosis Date  . Arthritis    "hands, back" (11/20/2016)  . Coronary artery disease   . Heart attack ([place]) 2002  . History of gout   . History of kidney stones   . Hyperlipidemia   . Hypertension     Past Surgical History:  Procedure Laterality Date  . APPENDECTOMY  1974  . BACK SURGERY    . CORONARY ANGIOPLASTY WITH STENT PLACEMENT  2002; 11/20/2016   "@ [place]; @ [facility]"  . CORONARY STENT INTERVENTION N/A 11/20/2016   Procedure: CORONARY STENT INTERVENTION;  Surgeon: Nelva Bush,  MD;  Location: [place] [facility];  Service: Cardiovascular;  Laterality: N/A;  . HEMI-MICRODISCECTOMY LUMBAR LAMINECTOMY LEVEL 1 Left 2008   L4  . INTRAVASCULAR ULTRASOUND/IVUS N/A 11/20/2016   Procedure: Intravascular Ultrasound/IVUS;  Surgeon: Nelva Bush, MD;  Location: [place] [facility];  Service: Cardiovascular;  Laterality: N/A;  . LEFT HEART CATH AND CORONARY ANGIOGRAPHY N/A 11/20/2016   Procedure: LEFT HEART CATH AND CORONARY ANGIOGRAPHY;  Surgeon: Nelva Bush, MD;  Location: [place] [facility];  Service: Cardiovascular;  Laterality: N/A;  . TONSILLECTOMY       Medications Prior to Admission: Prior to Admission medications   Medication Sig Start Date End Date Taking? Authorizing Provider  aspirin EC 81 MG tablet Take 1 tablet (81 mg total) by mouth daily. 11/17/16   Park Liter, MD  clopidogrel (PLAVIX) 75 MG tablet Take 1 tablet (75 mg total) by mouth daily with breakfast. 08/30/17   Park Liter, MD  Coenzyme Q10 (CO Q 10 PO) Take 625 mg by mouth 4 (four) times daily.    [provider]  lisinopril (PRINIVIL,ZESTRIL) 2.5 MG tablet Take 1 tablet (2.5 mg total) by mouth daily. 12/17/16 04/21/18  Park Liter, MD  metoprolol tartrate (LOPRESSOR) 25 MG tablet Take 1  tablet (25 mg total) by mouth 2 (two) times daily. 11/21/16   Strader, Fransisco Hertz, PA-C  nitroGLYCERIN (NITROSTAT) 0.4 MG SL tablet Place 1 tablet (0.4 mg total) under the tongue every 5 (five) minutes as needed for chest pain. 11/17/16 04/21/18  Park Liter, MD  rosuvastatin (CRESTOR) 40 MG tablet Take 1 tablet (40 mg total) by mouth daily. 11/17/16 04/21/18  Park Liter, MD     Allergies:    Allergies  Allergen Reactions  . Penicillin G Anaphylaxis, Swelling and Rash    Has patient had a PCN reaction causing immediate rash, facial/tongue/throat swelling, SOB or lightheadedness with hypotension:Yes Has patient had a PCN reaction causing severe rash involving mucus membranes or  skin necrosis:Yes--all over body Has patient had a PCN reaction that required hospitalization:No--treated in ER Has patient had a PCN reaction occurring within the last 10 years:No If all of the above answers are "NO", then may proceed with Cephalosporin use.     Social History:   Social History   Socioeconomic History  . Marital status: Married    Spouse name: Not on file  . Number of children: Not on file  . Years of education: Not on file  . Highest education level: Not on file  Occupational History  . Not on file  Social Needs  . Financial resource strain: Not on file  . Food insecurity:    Worry: Not on file    Inability: Not on file  . Transportation needs:    Medical: Not on file    Non-medical: Not on file  Tobacco Use  . Smoking status: Former Smoker    Packs/day: 1.00    Years: 35.00    Pack years: 35.00    Types: Cigarettes    Last attempt to quit: 02/06/2001    Years since quitting: 16.5  . Smokeless tobacco: Never Used  Substance and Sexual Activity  . Alcohol use: Yes    Alcohol/week: 9.0 oz    Types: 15 Cans of beer per week  . Drug use: No  . Sexual activity: Not Currently  Lifestyle  . Physical activity:    Days per week: Not on file    Minutes per session: Not on file  . Stress: Not on file  Relationships  . Social connections:    Talks on phone: Not on file    Gets together: Not on file    Attends religious service: Not on file    Active member of club or organization: Not on file    Attends meetings of clubs or organizations: Not on file    Relationship status: Not on file  . Intimate partner violence:    Fear of current or ex partner: Not on file    Emotionally abused: Not on file    Physically abused: Not on file    Forced sexual activity: Not on file  Other Topics Concern  . Not on file  Social History Narrative  . Not on file    Family History:   The patient's family history includes Cancer in his father.    ROS:  Please see  the history of present illness.  All other ROS reviewed and negative.    General:no colds or fevers, no weight changes Skin:no rashes or ulcers HEENT:no blurred vision, no congestion CV:see HPI PUL:see HPI GI:no diarrhea constipation or melena, no indigestion GU:no hematuria, no dysuria MS:no joint pain, no claudication, chronic back pain needs surgery Neuro:no syncope, no lightheadedness Endo:no diabetes, no  thyroid disease   Physical Exam/Data:   Vitals:   09/08/17 1130 09/08/17 1200 09/08/17 1230 09/08/17 1356  BP: (!) 103/54 102/78 110/70   Pulse: 72 66 79   Resp: 20 19 (!) 22   Temp:      TempSrc:      SpO2: 97% 99% 98%   Weight:    204 lb 14.4 oz (92.9 kg)  Height:    5\' 5"  (1.651 m)   No intake or output data in the 24 hours ending 09/08/17 1358 Filed Weights   09/08/17 0852 09/08/17 1356  Weight: 205 lb (93 kg) 204 lb 14.4 oz (92.9 kg)   Body mass index is 34.1 kg/m.  General:  Well nourished, well developed, in no acute distress  HEENT: normal Lymph: no adenopathy Neck: no  JVD Endocrine:  No thryomegaly Vascular: No carotid bruits; pedal pulses 2+ bilaterally   Cardiac:  normal S1, S2; RRR; no murmur, gallup rub or click  Lungs:  clear to auscultation bilaterally, no wheezing, rhonchi or rales  Abd: soft, nontender, no hepatomegaly  Ext: no lower ext edema currently Musculoskeletal:  No deformities, BUE and BLE strength normal and equal Skin: warm and dry  Neuro:  Alert and oriented X 3 MAE follows commands, no focal abnormalities noted Psych:  Normal affect   Wife is with him.  Relevant CV Studies: Cardiac cath 11/20/16 Conclusion   Conclusions: 1. Significant 2 vessel coronary artery disease including 80% proximal LAD stenosis involving a large first septal branch and 70% stenosis of moderate-caliber RPL branch. 2. Patent stent in the mid RCA with mild to moderate in-stent restenosis. 3. Mildly elevated left ventricular filling pressure. 4. Subtle  mid anterior hypokinesis with otherwise preserved left ventricular contraction. LVEF 50-55%. 5. Successful IVUS guided PCI to proximal/mid LAD with placement of a Xience Alpine 3.0 x 15 mm drug-eluting stent.  Recommendations: 1. Overnight extended recovery. 2. Dual antiplatelet therapy with aspirin and clopidogrel for at least 6 months, ideally longer. 3. Aggressive secondary prevention.      Laboratory Data:  Chemistry Recent Labs  Lab 09/07/17 2126 09/08/17 0901  NA 141 137  K 4.5 3.9  CL 103 103  CO2 30 23  GLUCOSE 149* 239*  BUN 15 15  CREATININE 1.27* 1.12  CALCIUM 9.6 8.9  GFRNONAA 58* >60  GFRAA >60 >60  ANIONGAP 8 11    No results for input(s): PROT, ALBUMIN, AST, ALT, ALKPHOS, BILITOT in the last 168 hours. Hematology Recent Labs  Lab 09/07/17 2126 09/08/17 0901  WBC 8.0 7.1  RBC 4.25 4.17*  HGB 12.7* 12.9*  HCT 38.6* 38.1*  MCV 90.8 91.4  MCH 29.9 30.9  MCHC 32.9 33.9  RDW 13.3 13.6  PLT 199 164   Cardiac Enzymes Recent Labs  Lab 09/08/17 0855  TROPONINI <0.03    Recent Labs  Lab 09/07/17 2149  TROPIPOC 0.00    BNPNo results for input(s): BNP, PROBNP in the last 168 hours.  DDimer No results for input(s): DDIMER in the last 168 hours.  Radiology/Studies:  Dg Chest 2 View  Result Date: 09/07/2017 CLINICAL DATA:  Chest pain, shortness of breath x1 day EXAM: CHEST - 2 VIEW COMPARISON:  12/31/2006 FINDINGS: Lungs are clear.  No pleural effusion or pneumothorax. The heart is normal in size. Mild degenerative changes of the visualized thoracolumbar spine. IMPRESSION: Normal chest radiographs. Electronically Signed   By: Julian Hy M.D.   On: 09/07/2017 21:57     Assessment and Plan:  6. Unstable/cresendo angina with neg troponin and stable EKG. Hx of PCI in 11/2016 and prior RCA stent 15 years or so ago  Dr Irish Lack to see and will plan for cath in AM 7. CAD with PCI 2018 on ASA and Plavix.   8. HLD on Crestor 40           Severity of Illness: The appropriate patient status for this patient is INPATIENT. Inpatient status is judged to be reasonable and necessary in order to provide the required intensity of service to ensure the patient's safety. The patient's presenting symptoms, physical exam findings, and initial radiographic and laboratory data in the context of their chronic comorbidities is felt to place them at high risk for further clinical deterioration. Furthermore, it is not anticipated that the patient will be medically stable for discharge from the hospital within 2 midnights of admission. The following factors support the patient status of inpatient.   " The patient's presenting symptoms include crescendo angina. " The worrisome physical exam findings include normal troponin. " The initial radiographic and laboratory data are worrisome because of none. " The chronic co-morbidities include CAD with prior stents and HLD. .   * I certify that at the point of admission it is my clinical judgment that the patient will require inpatient hospital care spanning beyond 2 midnights from the point of admission due to high intensity of service, high risk for further deterioration and high frequency of surveillance required.*    For questions or updates, please contact [place] Please consult [email or web address] for contact info under Cardiology/STEMI.    Signed, Cecilie Kicks, NP  09/08/2017 1:58 PM   I have examined the patient and reviewed assessment and plan and discussed with patient.  Agree with above as stated.  Given typical sx, will plan for cath.  THe patient is agreeable to this.  WOuld not believe negative stress test result so [facility], will start with cath.  Procedure explained and he is agreeable.  He has a strong radial pulse.  All questions answered.   Larae Grooms

## 2017-09-08 NOTE — Telephone Encounter (Signed)
Pt called c/o active CP and stated he went to [facility] ER last night but had to wait for 12 hrs so he left, stated he is still having active CP and was on the way to our office, I checked with RN and she advised pt go to ER downstairs since he was on the way to our facility/pt verbalized understanding

## 2017-09-08 NOTE — ED Triage Notes (Signed)
Pt reports chest pain since last night at 8pm. Took 1 NTG with relief. Chest pain returns with exertion. [facility] with [facility] but left due to wait time. Also endorses SOB. Denies N/V.

## 2017-09-08 NOTE — ED Provider Notes (Signed)
[facility] EMERGENCY DEPARTMENT Provider Note   CSN: [id number] Arrival date & time: 09/08/17  0846     History   Chief Complaint Chief Complaint  Patient presents with  . Chest Pain    HPI Eamon Tantillo Nudo is a 67 y.o. male.  67yo M w/ PMH including CAD, OSA, HTN, HLD who p/w chest pain. He was walking down the driveway around 8pm last night when he began having central chest pain radiating to shoulders and arms associated with SOB, paleness, and diaphoresis. He took a NTG which resolved pressure. He went to [facility] ER for evaluation but left due to wait time. His pain returned when he was walking back to the car in the parking lot. He took an ASA before going to bed. He has not had any pain this morning but he notes he has not tried to exert himself due to symptoms. He notes these are the same symptoms he had prior to stenting ~9 months ago. No fevers, cough/cold sx, recent illness. No leg swelling/pain, orthopnea, h/o blood clots, or h/o cancer.  The history is provided by the patient.  Chest Pain      Past Medical History:  Diagnosis Date  . Arthritis    "hands, back" (11/20/2016)  . Coronary artery disease   . Heart attack ([place]) 2002  . History of gout   . History of kidney stones   . Hyperlipidemia   . Hypertension     Patient Active Problem List   Diagnosis Date Noted  . Coronary artery disease 11/17/2016  . Dyslipidemia 11/17/2016  . Obstructive sleep apnea 11/17/2016  . Shortness of breath 11/17/2016  . Accelerating angina ([place]) 11/17/2016    Past Surgical History:  Procedure Laterality Date  . APPENDECTOMY  1974  . BACK SURGERY    . CORONARY ANGIOPLASTY WITH STENT PLACEMENT  2002; 11/20/2016   "@ [place]; @ [facility]"  . CORONARY STENT INTERVENTION N/A 11/20/2016   Procedure: CORONARY STENT INTERVENTION;  Surgeon: Nelva Bush, MD;  Location: [place] CV LAB;  Service: Cardiovascular;  Laterality: N/A;  . HEMI-MICRODISCECTOMY LUMBAR  LAMINECTOMY LEVEL 1 Left 2008   L4  . INTRAVASCULAR ULTRASOUND/IVUS N/A 11/20/2016   Procedure: Intravascular Ultrasound/IVUS;  Surgeon: Nelva Bush, MD;  Location: [facility] CV LAB;  Service: Cardiovascular;  Laterality: N/A;  . LEFT HEART CATH AND CORONARY ANGIOGRAPHY N/A 11/20/2016   Procedure: LEFT HEART CATH AND CORONARY ANGIOGRAPHY;  Surgeon: Nelva Bush, MD;  Location: [place] CV LAB;  Service: Cardiovascular;  Laterality: N/A;  . TONSILLECTOMY          Home Medications    Prior to Admission medications   Medication Sig Start Date End Date Taking? Authorizing Provider  aspirin EC 81 MG tablet Take 1 tablet (81 mg total) by mouth daily. 11/17/16   Park Liter, MD  clopidogrel (PLAVIX) 75 MG tablet Take 1 tablet (75 mg total) by mouth daily with breakfast. 08/30/17   Park Liter, MD  Coenzyme Q10 (CO Q 10 PO) Take 625 mg by mouth 4 (four) times daily.    [provider]  lisinopril (PRINIVIL,ZESTRIL) 2.5 MG tablet Take 1 tablet (2.5 mg total) by mouth daily. 12/17/16 04/21/18  Park Liter, MD  metoprolol tartrate (LOPRESSOR) 25 MG tablet Take 1 tablet (25 mg total) by mouth 2 (two) times daily. 11/21/16   Strader, Fransisco Hertz, PA-C  nitroGLYCERIN (NITROSTAT) 0.4 MG SL tablet Place 1 tablet (0.4 mg total) under the tongue  every 5 (five) minutes as needed for chest pain. 11/17/16 04/21/18  Park Liter, MD  rosuvastatin (CRESTOR) 40 MG tablet Take 1 tablet (40 mg total) by mouth daily. 11/17/16 04/21/18  Park Liter, MD    Family History Family History  Problem Relation Age of Onset  . Cancer Father     Social History Social History   Tobacco Use  . Smoking status: Former Smoker    Packs/day: 1.00    Years: 35.00    Pack years: 35.00    Types: Cigarettes    Last attempt to quit: 02/06/2001    Years since quitting: 16.5  . Smokeless tobacco: Never Used  Substance Use Topics  . Alcohol use: Yes    Alcohol/week: 9.0 oz     Types: 15 Cans of beer per week  . Drug use: No     Allergies   Penicillin g   Review of Systems Review of Systems  Cardiovascular: Positive for chest pain.  All other systems reviewed and are negative except that which was mentioned in HPI    Physical Exam Updated Vital Signs BP 124/88 (BP Location: Right Arm)   Pulse 77   Temp 97.9 F (36.6 C) (Oral)   Resp 16   Ht 5\' 5"  (1.651 m)   Wt 93 kg (205 lb)   SpO2 95%   BMI 34.11 kg/m   Physical Exam  Constitutional: He is oriented to person, place, and time. He appears well-developed and well-nourished. No distress.  HENT:  Head: Normocephalic and atraumatic.  Moist mucous membranes  Eyes: Conjunctivae are normal.  Neck: Neck supple.  Cardiovascular: Normal rate, regular rhythm and normal heart sounds.  No murmur heard. Pulmonary/Chest: Effort normal and breath sounds normal.  Abdominal: Soft. Bowel sounds are normal. He exhibits no distension. There is no tenderness.  Musculoskeletal: He exhibits no edema.  Neurological: He is alert and oriented to person, place, and time.  Fluent speech  Skin: Skin is warm and dry.  Psychiatric: He has a normal mood and affect. Judgment normal.  Nursing note and vitals reviewed.    ED Treatments / Results  Labs (all labs ordered are listed, but only abnormal results are displayed) Labs Reviewed  BASIC METABOLIC PANEL - Abnormal; Notable for the following components:      Result Value   Glucose, Bld 239 (*)    All other components within normal limits  CBC - Abnormal; Notable for the following components:   RBC 4.17 (*)    Hemoglobin 12.9 (*)    HCT 38.1 (*)    All other components within normal limits  TROPONIN I    EKG EKG Interpretation  Date/Time:  Wednesday Sep 08 2017 08:54:18 EDT Ventricular Rate:  78 PR Interval:    QRS Duration: 97 QT Interval:  397 QTC Calculation: 453 R Axis:     Text Interpretation:  Sinus rhythm No significant change since last  tracing Confirmed by Theotis Burrow [id number]) on 09/08/2017 9:07:38 AM   Radiology Dg Chest 2 View  Result Date: 09/07/2017 CLINICAL DATA:  Chest pain, shortness of breath x1 day EXAM: CHEST - 2 VIEW COMPARISON:  12/31/2006 FINDINGS: Lungs are clear.  No pleural effusion or pneumothorax. The heart is normal in size. Mild degenerative changes of the visualized thoracolumbar spine. IMPRESSION: Normal chest radiographs. Electronically Signed   By: Julian Hy M.D.   On: 09/07/2017 21:57    Procedures Procedures (including critical care time)  Medications Ordered in ED Medications -  No data to display   Initial Impression / Assessment and Plan / ED Course  I have reviewed the triage vital signs and the nursing notes.  Pertinent labs & imaging results that were available during my care of the patient were reviewed by me and considered in my medical decision making (see chart for details).     Pt trouble on exam with reassuring vital signs.  EKG without acute ischemic changes.  His lab work shows mild hyperglycemia, negative troponin.  I am concerned about the exertional nature of his chest pain and the acceleration of symptoms with minimal exertion now. His previous cath shows several areas of disease in addition to vessel that was stented. Given concern for unstable angina, contacted cardiology service and discussed with [place]. Pt accepted to Dr. Hassell Done service and transferred to [facility] for further cardiac work up. Pt chest pain free while in the ED therefore did not start NTG/Heparin drips.  Final Clinical Impressions(s) / ED Diagnoses   Final diagnoses:  Unstable angina [facility])    ED Discharge Orders    None       Little, Wenda Overland, MD 09/08/17 1538

## 2017-09-08 NOTE — ED Notes (Signed)
No answer when called for recheck vitalsigns

## 2017-09-09 ENCOUNTER — Other Ambulatory Visit (HOSPITAL_COMMUNITY): Payer: PPO

## 2017-09-09 ENCOUNTER — Inpatient Hospital Stay (HOSPITAL_COMMUNITY)
Admission: EM | Disposition: A | Discharge status: Home / Self Care | Payer: Self-pay | Source: Home / Self Care | Attending: Interventional Cardiology

## 2017-09-09 ENCOUNTER — Encounter (HOSPITAL_COMMUNITY): Payer: Self-pay | Admitting: Cardiovascular Disease

## 2017-09-09 DIAGNOSIS — I25110 Atherosclerotic heart disease of native coronary artery with unstable angina pectoris: Principal | ICD-10-CM

## 2017-09-09 HISTORY — PX: LEFT HEART CATH AND CORONARY ANGIOGRAPHY: CATH118249

## 2017-09-09 HISTORY — PX: CORONARY STENT INTERVENTION: CATH118234

## 2017-09-09 LAB — BASIC METABOLIC PANEL
Anion gap: 10 (ref 5–15)
BUN: 11 mg/dL (ref 6–20)
CHLORIDE: 102 mmol/L (ref 101–111)
CO2: 28 mmol/L (ref 22–32)
CREATININE: 1.13 mg/dL (ref 0.61–1.24)
Calcium: 9.3 mg/dL (ref 8.9–10.3)
GFR calc Af Amer: >60 mL/min (ref >60)
GFR calc non Af Amer: >60 mL/min (ref >60)
Glucose, Bld: 124 mg/dL — ABNORMAL HIGH (ref 65–99)
POTASSIUM: 4.4 mmol/L (ref 3.5–5.1)
SODIUM: 140 mmol/L (ref 135–145)

## 2017-09-09 LAB — CBC
HEMATOCRIT: 37 % — AB (ref 39.0–52.0)
HEMOGLOBIN: 11.9 g/dL — AB (ref 13.0–17.0)
MCH: 29.9 pg (ref 26.0–34.0)
MCHC: 32.2 g/dL (ref 30.0–36.0)
MCV: 93 fL (ref 78.0–100.0)
PLATELETS: 165 10*3/uL (ref 150–400)
RBC: 3.98 MIL/uL — AB (ref 4.22–5.81)
RDW: 13.3 % (ref 11.5–15.5)
WBC: 7.5 10*3/uL (ref 4.0–10.5)

## 2017-09-09 LAB — LIPID PANEL
CHOL/HDL RATIO: 4.3 ratio
CHOLESTEROL: 129 mg/dL (ref 0–200)
HDL: 30 mg/dL — ABNORMAL LOW (ref >40)
LDL Cholesterol: 24 mg/dL (ref 0–99)
Triglycerides: 373 mg/dL — ABNORMAL HIGH (ref <150)
VLDL: 75 mg/dL — ABNORMAL HIGH (ref 0–40)

## 2017-09-09 LAB — POCT ACTIVATED CLOTTING TIME
ACTIVATED CLOTTING TIME: 263 s
ACTIVATED CLOTTING TIME: 362 s

## 2017-09-09 LAB — PROTIME-INR
INR: 1.03
Prothrombin Time: 13.4 seconds (ref 11.4–15.2)

## 2017-09-09 LAB — HIV ANTIBODY (ROUTINE TESTING W REFLEX): HIV Screen 4th Generation wRfx: NONREACTIVE

## 2017-09-09 SURGERY — LEFT HEART CATH AND CORONARY ANGIOGRAPHY
Anesthesia: LOCAL

## 2017-09-09 MED ORDER — SODIUM CHLORIDE 0.9% FLUSH
3.0000 mL | Freq: Two times a day (BID) | INTRAVENOUS | Status: DC
  Administered 2017-09-09 – 2017-09-10 (×3): 3 mL via INTRAVENOUS

## 2017-09-09 MED ORDER — VERAPAMIL HCL 2.5 MG/ML IV SOLN
INTRAVENOUS | Status: AC
  Filled 2017-09-09: qty 2

## 2017-09-09 MED ORDER — SODIUM CHLORIDE 0.9 % IV SOLN: 250.0000 mL | INTRAVENOUS | Status: DC | PRN

## 2017-09-09 MED ORDER — SODIUM CHLORIDE 0.9% FLUSH: 3.0000 mL | INTRAVENOUS | Status: DC | PRN

## 2017-09-09 MED ORDER — IOHEXOL 350 MG/ML SOLN
INTRAVENOUS | Status: DC | PRN
  Administered 2017-09-09: 145 mL

## 2017-09-09 MED ORDER — CLOPIDOGREL BISULFATE 300 MG PO TABS
ORAL_TABLET | ORAL | Status: DC | PRN
  Administered 2017-09-09: 300 mg via ORAL

## 2017-09-09 MED ORDER — LIDOCAINE HCL (PF) 1 % IJ SOLN
INTRAMUSCULAR | Status: AC
  Filled 2017-09-09: qty 30

## 2017-09-09 MED ORDER — CLOPIDOGREL BISULFATE 300 MG PO TABS
ORAL_TABLET | ORAL | Status: AC
  Filled 2017-09-09: qty 1

## 2017-09-09 MED ORDER — LABETALOL HCL 5 MG/ML IV SOLN: 10.0000 mg | INTRAVENOUS | Status: AC | PRN

## 2017-09-09 MED ORDER — ANGIOPLASTY BOOK
Freq: Once | Status: DC
  Filled 2017-09-09: qty 1

## 2017-09-09 MED ORDER — HEPARIN (PORCINE) IN NACL 2-0.9 UNITS/ML
INTRAMUSCULAR | Status: AC | PRN
  Administered 2017-09-09 (×2): 500 mL

## 2017-09-09 MED ORDER — HYDRALAZINE HCL 20 MG/ML IJ SOLN: 5.0000 mg | INTRAMUSCULAR | Status: AC | PRN

## 2017-09-09 MED ORDER — MIDAZOLAM HCL 2 MG/2ML IJ SOLN
INTRAMUSCULAR | Status: DC | PRN
  Administered 2017-09-09 (×2): 1 mg via INTRAVENOUS

## 2017-09-09 MED ORDER — VERAPAMIL HCL 2.5 MG/ML IV SOLN
INTRAVENOUS | Status: DC | PRN
  Administered 2017-09-09: 10 mL via INTRA_ARTERIAL

## 2017-09-09 MED ORDER — HEPARIN SODIUM (PORCINE) 1000 UNIT/ML IJ SOLN
INTRAMUSCULAR | Status: AC
  Filled 2017-09-09: qty 1

## 2017-09-09 MED ORDER — LIDOCAINE HCL (PF) 1 % IJ SOLN
INTRAMUSCULAR | Status: DC | PRN
  Administered 2017-09-09: 2 mL

## 2017-09-09 MED ORDER — HEPARIN (PORCINE) IN NACL 1000-0.9 UT/500ML-% IV SOLN
INTRAVENOUS | Status: AC
  Filled 2017-09-09: qty 1000

## 2017-09-09 MED ORDER — FENTANYL CITRATE (PF) 100 MCG/2ML IJ SOLN
INTRAMUSCULAR | Status: DC | PRN
  Administered 2017-09-09: 25 ug via INTRAVENOUS
  Administered 2017-09-09: 50 ug via INTRAVENOUS
  Administered 2017-09-09: 25 ug via INTRAVENOUS

## 2017-09-09 MED ORDER — SODIUM CHLORIDE 0.9 % IV SOLN: INTRAVENOUS | Status: AC

## 2017-09-09 MED ORDER — FENTANYL CITRATE (PF) 100 MCG/2ML IJ SOLN
INTRAMUSCULAR | Status: AC
  Filled 2017-09-09: qty 2

## 2017-09-09 MED ORDER — MIDAZOLAM HCL 2 MG/2ML IJ SOLN
INTRAMUSCULAR | Status: AC
  Filled 2017-09-09: qty 2

## 2017-09-09 MED ORDER — HEPARIN SODIUM (PORCINE) 1000 UNIT/ML IJ SOLN
INTRAMUSCULAR | Status: DC | PRN
  Administered 2017-09-09: 5000 [IU] via INTRAVENOUS
  Administered 2017-09-09: 2000 [IU] via INTRAVENOUS
  Administered 2017-09-09: 5000 [IU] via INTRAVENOUS

## 2017-09-09 SURGICAL SUPPLY — 19 items
BALLN SAPPHIRE 2.0X15 (BALLOONS) ×2
BALLN ~~LOC~~ EMERGE MR 2.5X15 (BALLOONS) ×2
BALLOON SAPPHIRE 2.0X15 (BALLOONS) ×1 IMPLANT
BALLOON ~~LOC~~ EMERGE MR 2.5X15 (BALLOONS) ×1 IMPLANT
CATH 5FR JL3.5 JR4 ANG PIG MP (CATHETERS) ×2 IMPLANT
CATH LAUNCHER 6FR AL.75 (CATHETERS) ×2 IMPLANT
DEVICE RAD COMP TR BAND LRG (VASCULAR PRODUCTS) ×2 IMPLANT
GUIDEWIRE INQWIRE 1.5J.035X260 (WIRE) ×1 IMPLANT
INQWIRE 1.5J .035X260CM (WIRE) ×2
KIT ENCORE 26 ADVANTAGE (KITS) ×2 IMPLANT
KIT HEART LEFT (KITS) ×2 IMPLANT
NEEDLE PERC 21GX4CM (NEEDLE) ×2 IMPLANT
PACK CARDIAC CATHETERIZATION (CUSTOM PROCEDURE TRAY) ×2 IMPLANT
SHEATH RAIN RADIAL 21G 6FR (SHEATH) ×2 IMPLANT
STENT SYNERGY DES 2.25X32 (Permanent Stent) ×2 IMPLANT
SYR MEDRAD MARK V 150ML (SYRINGE) ×2 IMPLANT
TRANSDUCER W/STOPCOCK (MISCELLANEOUS) ×2 IMPLANT
TUBING CIL FLEX 10 FLL-RA (TUBING) ×2 IMPLANT
WIRE COUGAR XT STRL 190CM (WIRE) ×2 IMPLANT

## 2017-09-09 NOTE — Interval H&P Note (Signed)
History and Physical Interval Note:  09/09/2017 8:35 AM  Robert Ramos  has presented today for cardiac cath with the diagnosis of unstable angina  The various methods of treatment have been discussed with the patient and family. After consideration of risks, benefits and other options for treatment, the patient has consented to  Procedure(s): LEFT HEART CATH AND CORONARY ANGIOGRAPHY (N/A) as a surgical intervention .  The patient's history has been reviewed, patient examined, no change in status, stable for surgery.  I have reviewed the patient's chart and labs.  Questions were answered to the patient's satisfaction.    Cath Lab Visit (complete for each Cath Lab visit)  Clinical Evaluation Leading to the Procedure:   ACS: No.  Non-ACS:    Anginal Classification: CCS III  Anti-ischemic medical therapy: Minimal Therapy (1 class of medications)  Non-Invasive Test Results: No non-invasive testing performed  Prior CABG: No previous CABG        Lauree Chandler

## 2017-09-09 NOTE — Progress Notes (Signed)
TRB BAND REMOVAL  LOCATION:    right radial  DEFLATED PER PROTOCOL:    Yes.    TIME BAND OFF / DRESSING APPLIED:    1345   SITE UPON ARRIVAL:    Level 0  SITE AFTER BAND REMOVAL:    Level 0  CIRCULATION SENSATION AND MOVEMENT:    Within Normal Limits   Yes.    COMMENTS:   Tolerated procedure well 

## 2017-09-10 ENCOUNTER — Inpatient Hospital Stay (HOSPITAL_COMMUNITY): Payer: PPO

## 2017-09-10 ENCOUNTER — Encounter (HOSPITAL_COMMUNITY): Payer: Self-pay | Admitting: Physician Assistant

## 2017-09-10 DIAGNOSIS — I371 Nonrheumatic pulmonary valve insufficiency: Secondary | ICD-10-CM

## 2017-09-10 DIAGNOSIS — I10 Essential (primary) hypertension: Secondary | ICD-10-CM

## 2017-09-10 DIAGNOSIS — I361 Nonrheumatic tricuspid (valve) insufficiency: Secondary | ICD-10-CM

## 2017-09-10 DIAGNOSIS — I351 Nonrheumatic aortic (valve) insufficiency: Secondary | ICD-10-CM

## 2017-09-10 LAB — BASIC METABOLIC PANEL
ANION GAP: 7 (ref 5–15)
BUN: 11 mg/dL (ref 6–20)
CO2: 27 mmol/L (ref 22–32)
Calcium: 9 mg/dL (ref 8.9–10.3)
Chloride: 104 mmol/L (ref 101–111)
Creatinine, Ser: 1.15 mg/dL (ref 0.61–1.24)
GFR calc Af Amer: >60 mL/min (ref >60)
GFR calc non Af Amer: >60 mL/min (ref >60)
GLUCOSE: 124 mg/dL — AB (ref 65–99)
POTASSIUM: 4.3 mmol/L (ref 3.5–5.1)
Sodium: 138 mmol/L (ref 135–145)

## 2017-09-10 LAB — ECHOCARDIOGRAM COMPLETE
HEIGHTINCHES: 65 in
WEIGHTICAEL: 3439.18 [oz_av]

## 2017-09-10 LAB — CBC
HCT: 38 % — ABNORMAL LOW (ref 39.0–52.0)
Hemoglobin: 11.9 g/dL — ABNORMAL LOW (ref 13.0–17.0)
MCH: 29.5 pg (ref 26.0–34.0)
MCHC: 31.3 g/dL (ref 30.0–36.0)
MCV: 94.1 fL (ref 78.0–100.0)
PLATELETS: 170 10*3/uL (ref 150–400)
RBC: 4.04 MIL/uL — ABNORMAL LOW (ref 4.22–5.81)
RDW: 13.3 % (ref 11.5–15.5)
WBC: 7.3 10*3/uL (ref 4.0–10.5)

## 2017-09-10 MED ORDER — ISOSORBIDE MONONITRATE ER 30 MG PO TB24: 30.0000 mg | ORAL_TABLET | Freq: Every day | ORAL | 6 refills | Status: DC

## 2017-09-10 MED ORDER — ISOSORBIDE MONONITRATE ER 30 MG PO TB24
30.0000 mg | ORAL_TABLET | Freq: Every day | ORAL | Status: DC
  Administered 2017-09-10: 10:00:00 30 mg via ORAL
  Filled 2017-09-10: qty 1

## 2017-09-10 MED FILL — Heparin Sod (Porcine)-NaCl IV Soln 1000 Unit/500ML-0.9%: INTRAVENOUS | Qty: 1000 | Status: AC

## 2017-09-10 NOTE — Progress Notes (Signed)
Patient ambulated approximately 300 ft ,  tolerated well , denies any chest pain nor any discomforts. Vital signs stable during and post ambulation.

## 2017-09-10 NOTE — Progress Notes (Addendum)
Progress Note  Patient Name: Robert Ramos Date of Encounter: 09/10/2017  Primary Cardiologist: Jenne Campus, MD   Subjective   S/p PCI yesterday.  Had some chest pain that was mild, post PCI.  THis AM, had some chest pressure with walking that was relieved with NTG.  Inpatient Medications    Scheduled Meds: . angioplasty book   Does not apply Once  . aspirin EC  81 mg Oral Daily  . clopidogrel  75 mg Oral Q breakfast  . isosorbide mononitrate  30 mg Oral Daily  . lisinopril  2.5 mg Oral Daily  . metoprolol tartrate  25 mg Oral BID  . rosuvastatin  40 mg Oral Daily  . sodium chloride flush  3 mL Intravenous Q12H   Continuous Infusions: . sodium chloride     PRN Meds: sodium chloride, acetaminophen, ALPRAZolam, nitroGLYCERIN, nitroGLYCERIN, ondansetron (ZOFRAN) IV, sodium chloride flush   Vital Signs    Vitals:   09/09/17 2004 09/09/17 2124 09/10/17 0331 09/10/17 0706  BP: 121/74 133/69 125/67 120/80  Pulse: 86 89 82 75  Resp: (!) 23  14 20   Temp: 98 F (36.7 C)  97.8 F (36.6 C) 98.1 F (36.7 C)  TempSrc: Oral  Oral Oral  SpO2: 97%  98% 97%  Weight:   214 lb 15.2 oz (97.5 kg)   Height:        Intake/Output Summary (Last 24 hours) at 09/10/2017 0957 Last data filed at 09/10/2017 0700 Gross per 24 hour  Intake 1210 ml  Output 1755 ml  Net -545 ml   Filed Weights   09/08/17 1356 09/09/17 0617 09/10/17 0331  Weight: 204 lb 14.4 oz (92.9 kg) 199 lb 12.8 oz (90.6 kg) 214 lb 15.2 oz (97.5 kg)    Telemetry    NSR - Personally Reviewed  ECG    NSR, no ST changes  Physical Exam   GEN: No acute distress.   Neck: No JVD Cardiac: RRR, no murmurs, rubs, or gallops.  Respiratory: Clear to auscultation bilaterally. GI: Soft, nontender, non-distended  MS: No edema; No deformity. Right wrist stable, without hematoma Neuro:  Nonfocal  Psych: Normal affect   Labs    Chemistry Recent Labs  Lab 09/08/17 0901 09/08/17 1618 09/09/17 0352  09/10/17 0218  NA 137  --  140 138  K 3.9  --  4.4 4.3  CL 103  --  102 104  CO2 23  --  28 27  GLUCOSE 239*  --  124* 124*  BUN 15  --  11 11  CREATININE 1.12 1.04 1.13 1.15  CALCIUM 8.9  --  9.3 9.0  GFRNONAA >60 >60 >60 >60  GFRAA >60 >60 >60 >60  ANIONGAP 11  --  10 7     Hematology Recent Labs  Lab 09/08/17 1618 09/09/17 0352 09/10/17 0218  WBC 5.6 7.5 7.3  RBC 4.15* 3.98* 4.04*  HGB 12.5* 11.9* 11.9*  HCT 38.1* 37.0* 38.0*  MCV 91.8 93.0 94.1  MCH 30.1 29.9 29.5  MCHC 32.8 32.2 31.3  RDW 13.2 13.3 13.3  PLT 157 165 170    Cardiac Enzymes Recent Labs  Lab 09/08/17 0855 09/08/17 1618  TROPONINI <0.03 <0.03    Recent Labs  Lab 09/07/17 2149  TROPIPOC 0.00     BNPNo results for input(s): BNP, PROBNP in the last 168 hours.   DDimer No results for input(s): DDIMER in the last 168 hours.   Radiology    No results found.  Cardiac  Studies   Cath results reviewed  Patient Profile     67 y.o. male with CAD  Assessment & Plan    1) CAD: COntinue DAPT.  Start low dose Imdur.  WIll reassess later today frp possible discharge.  COntinue statin treatment as well.   For questions or updates, please contact [place] Please consult [email or web address] for contact info under Cardiology/STEMI.      Signed, Larae Grooms, MD  09/10/2017, 9:57 AM

## 2017-09-10 NOTE — Consult Note (Signed)
[facility] CM Primary Care Navigator  09/10/2017  Robert Ramos 04/03/1951 [id number]   Met withpatient and wife (Robert Ramos)at the bedsideto identify possible discharge needs. Patientreports having ""chest pains" that had ledto this admission.(unstable angina, CAD- coronary artery disease, status post cardiac catheterization and coronary stent intervention)  Patient endorses Dr.Greg Grisso with [facility] ([place]) ashisprimary care provider.   [facility] [place] [facility].  Wifestates that patienthas beenmanaginghismedications at home with use of "pill box" system filled every week.  Patient reports that he was driving prior to admission, but wifecan providetransportation to hisdoctors'appointments if needed after discharge  Patientand wife lives at home. Wife will be his primary caregiver upon discharge.  Anticipateddischarge planishomewith possible outpatient rehab per wife.  Patientand wifevoiced understanding to call primary care provider's officewhen hereturnshomefor a post discharge follow-upvisitwithin1- 2weeksor sooner if needs arise.Patient letter (with PCP's contact number) was provided asareminder.   Discussed withpatientand wife regarding [facility] CM services available for health management at home buthe deniesany needs or concerns at this point.Wife has been helping patient in managing health needs at home.  Patient and wifeverbalized understandingto seekreferral from primary care provider to [facility] care management if deemed necessaryand appropriate for anyservices in the future.  [facility] care management information was provided for future needs thatpatient may have.  Patienthowever, hadverbally agreedand opted forEMMIcalls tofollowup hisrecoveryat home.   Referral made for EMMI General calls after  discharge.    For additional questions please contact:  Lorraine A. Ajel, BSN, RN-BC [facility] PRIMARY CARE Navigator Cell: [phone] 

## 2017-09-10 NOTE — Progress Notes (Signed)
  Echocardiogram 2D Echocardiogram has been performed.  Merrie Roof F 09/10/2017, 3:38 PM

## 2017-09-10 NOTE — Discharge Summary (Addendum)
Discharge Summary    Patient ID: Robert Ramos,  MRN: [id number], DOB/AGE: Jan 15, 1951 67 y.o.  Admit date: 09/08/2017 Discharge date: 09/10/2017  Primary Care Provider: Gilford Rile A. Primary Cardiologist: Jenne Campus, MD   Discharge Diagnoses    Active Problems:   Unstable angina (HCC)   Angina pectoris, crescendo ([place])   CAD   HLD   HTN  Allergies Allergies  Allergen Reactions  . Penicillin G Anaphylaxis, Swelling and Rash    Has patient had a PCN reaction causing immediate rash, facial/tongue/throat swelling, SOB or lightheadedness with hypotension:Yes Has patient had a PCN reaction causing severe rash involving mucus membranes or skin necrosis:Yes--all over body Has patient had a PCN reaction that required hospitalization:No--treated in ER Has patient had a PCN reaction occurring within the last 10 years:No If all of the above answers are "NO", then may proceed with Cephalosporin use.     Diagnostic Studies/Procedures    CORONARY STENT INTERVENTION  LEFT HEART CATH AND CORONARY ANGIOGRAPHY  Conclusion     Mid RCA-2 lesion is 40% stenosed.  Mid RCA-1 lesion is 40% stenosed.  Post Atrio lesion is 99% stenosed.  A drug-eluting stent was successfully placed using a STENT SYNERGY DES [id number].  Post intervention, there is a 0% residual stenosis.  Ost Ramus lesion is 50% stenosed.  Ost 2nd Mrg lesion is 70% stenosed.  Prox Cx to Mid Cx lesion is 30% stenosed.  Mid Cx to Dist Cx lesion is 30% stenosed.  Ost 1st Sept lesion is 50% stenosed.  Previously placed Prox LAD to Mid LAD stent (unknown type) is widely patent.  Mid LAD lesion is 40% stenosed.  The left ventricular systolic function is normal.  LV end diastolic pressure is normal.  The left ventricular ejection fraction is greater than 65% by visual estimate.  There is no mitral valve regurgitation.   1. Unstable angina 2. Patent stent mid LAD 3. Non-obstructive disease in the  mid Circumflex. The first OM branch is small in caliber and has a moderately severe ostial stenosis. This vessel is too small for PCI. The intermediate branch is a small to moderate caliber vessel with moderate ostial stenosis.  4. The RCA is a large dominant vessel with patent mid stented segment with 40% stent restenosis, unchanged from last cath in 2018. The posterolateral artery is a moderate caliber vessel with 99% stenosis.  5. Normal LV systolic fucntion 6. Successful PTCA/DES x 1 posterolateral artery  Recommendations: Continue DAPT with ASA and Plavix for one year if possible. If elective back surgery is indicated, could consider stopping DAPT in 3 months.     Diagnostic Diagram       Post-Intervention Diagram         History of Present Illness     Robert Ramos is a 67 y.o. male with a history of CAD with complex PCI in 11/2016, MI in 2002, HLD, HTN and gout  presents with chest pain.    Last cath 11/2017 with patent previous stent to [facility] with mild to moderate in-stent restenosis, and LAD disease receiving DES to prox and mid LAD.  He has been on ASA and plavix.  Pt had a sleep study but no cpap recommended, only meds .   He presented to [facility] ED 5/29 after developing chest pain.  Pt has had chest pressure with exertion for last week. He took 1 NTG with relief.  He has not had any further pain at rest.  He was  walking up a hill last with mild chest discomfort. EKG SR normal EKG I personally reviewed.   Troponin 0.00 and < 0.03 Na 137, K+ 3.9, glucose 239, Cr 1.12 Hgb 12.9 WBC 7.1, plts 164 CXR normal.   He does have rapid HR that wakes him from sleep.  He does not sleep well, has chronic back pain >> Dr. Vertell Limber for possible surgery.     Hospital Course     Consultants: None  The patient was admitted for further evaluation. Given symptoms concerning for typical angina he was taken to cath lab. Detailed report as above. Patent mLAD stent. 40% in-stent restenosis of  mRCA, unchanged from prior cath. The posterolateral artery is a moderate caliber vessel with 99% stenosis s/p PTCA and DES. No recurrent chest pain. Ambulating we. Normal LV function by cath. Patient had waxing and weaning pain post PCI. Worse episode while ambulation with cardiac rehab. Symptoms resolved with nitro x 1. EKG without acute changes. Started Imdur 30mg  qd. No recurrent chest pain with ambulation. Felt stable for discharge. Echo pending this admission. This can be dose as outpatient if not done prior to discharge.   The patient has been seen by Dr. Irish Lack today and deemed ready for discharge home. All follow-up appointments have been scheduled. Discharge medications are listed below.    Discharge Vitals Blood pressure 109/73, pulse 68, temperature 98.1 F (36.7 C), resp. rate 16, height 5\' 5"  (1.651 m), weight 214 lb 15.2 oz (97.5 kg), SpO2 96 %.  Filed Weights   09/08/17 1356 09/09/17 0617 09/10/17 0331  Weight: 204 lb 14.4 oz (92.9 kg) 199 lb 12.8 oz (90.6 kg) 214 lb 15.2 oz (97.5 kg)   Physical Exam  Constitutional: He is oriented to person, place, and time. He appears well-developed and well-nourished.  HENT:  Head: Normocephalic and atraumatic.  Eyes: Pupils are equal, round, and reactive to light. Conjunctivae are normal.  Neck: Normal range of motion. Neck supple.  Cardiovascular: Normal rate and regular rhythm.  R radial cath site stable  Pulmonary/Chest: Effort normal and breath sounds normal.  Abdominal: Soft. Bowel sounds are normal.  Musculoskeletal: Normal range of motion.  Neurological: He is alert and oriented to person, place, and time.  Skin: Skin is warm and dry.  Psychiatric: He has a normal mood and affect.   Labs & Radiologic Studies     CBC Recent Labs    09/09/17 0352 09/10/17 0218  WBC 7.5 7.3  HGB 11.9* 11.9*  HCT 37.0* 38.0*  MCV 93.0 94.1  PLT 165 962   Basic Metabolic Panel Recent Labs    09/08/17 1618 09/09/17 0352  09/10/17 0218  NA  --  140 138  K  --  4.4 4.3  CL  --  102 104  CO2  --  28 27  GLUCOSE  --  124* 124*  BUN  --  11 11  CREATININE 1.04 1.13 1.15  CALCIUM  --  9.3 9.0  MG 2.0  --   --    Cardiac Enzymes Recent Labs    09/08/17 0855 09/08/17 1618  TROPONINI <0.03 <0.03   Hemoglobin A1C Recent Labs    09/08/17 1618  HGBA1C 6.1*   Fasting Lipid Panel Recent Labs    09/09/17 0352  CHOL 129  HDL 30*  LDLCALC 24  TRIG 373*  CHOLHDL 4.3   Thyroid Function Tests Recent Labs    09/08/17 1618  TSH 1.305    Dg Chest 2 View  Result  Date: 09/07/2017 CLINICAL DATA:  Chest pain, shortness of breath x1 day EXAM: CHEST - 2 VIEW COMPARISON:  12/31/2006 FINDINGS: Lungs are clear.  No pleural effusion or pneumothorax. The heart is normal in size. Mild degenerative changes of the visualized thoracolumbar spine. IMPRESSION: Normal chest radiographs. Electronically Signed   By: Julian Hy M.D.   On: 09/07/2017 21:57    Disposition   Pt is being discharged home today in good condition.  Follow-up Plans & Appointments    Follow-up Information    [facility]. Go on 09/17/2017.   Specialty:  Cardiology Why:  @1 :20pm for hosptial follow up With Dr. Napoleon Form information: [address], [place] [phone]         Discharge Instructions    Amb Referral to Cardiac Rehabilitation   Complete by:  As directed    refering to [facility]   Diagnosis:  Coronary Stents   Diet - low sodium heart healthy   Complete by:  As directed    Discharge instructions   Complete by:  As directed    No driving for 48 hours. No lifting over 5 lbs for 1 week. No sexual activity for 1 week. You may return to work on 09/15/17.  Keep procedure site clean & dry. If you notice increased pain, swelling, bleeding or pus, call/return!  You may shower, but no soaking baths/hot tubs/pools for 1 week.   Increase activity  slowly   Complete by:  As directed       Discharge Medications   Allergies as of 09/10/2017      Reactions   Penicillin G Anaphylaxis, Swelling, Rash   Has patient had a PCN reaction causing immediate rash, facial/tongue/throat swelling, SOB or lightheadedness with hypotension:Yes Has patient had a PCN reaction causing severe rash involving mucus membranes or skin necrosis:Yes--all over body Has patient had a PCN reaction that required hospitalization:No--treated in ER Has patient had a PCN reaction occurring within the last 10 years:No If all of the above answers are "NO", then may proceed with Cephalosporin use.      Medication List    TAKE these medications   aspirin EC 81 MG tablet Take 1 tablet (81 mg total) by mouth daily.   clopidogrel 75 MG tablet Commonly known as:  PLAVIX Take 1 tablet (75 mg total) by mouth daily with breakfast.   isosorbide mononitrate 30 MG 24 hr tablet Commonly known as:  IMDUR Take 1 tablet (30 mg total) by mouth daily. Start taking on:  09/11/2017   lisinopril 2.5 MG tablet Commonly known as:  PRINIVIL,ZESTRIL Take 1 tablet (2.5 mg total) by mouth daily.   metoprolol tartrate 25 MG tablet Commonly known as:  LOPRESSOR Take 1 tablet (25 mg total) by mouth 2 (two) times daily.   nitroGLYCERIN 0.4 MG SL tablet Commonly known as:  NITROSTAT Place 1 tablet (0.4 mg total) under the tongue every 5 (five) minutes as needed for chest pain.   rosuvastatin 40 MG tablet Commonly known as:  CRESTOR Take 1 tablet (40 mg total) by mouth daily.   Vitamin D (Ergocalciferol) 50000 units Caps capsule Commonly known as:  DRISDOL Take 50,000 Units by mouth once a week.         Outstanding Labs/Studies   None Duration of Discharge Encounter   Greater than 30 minutes including physician time.  Signed, Leanor Kail PA-C 09/10/2017, 2:33 PM   I have examined the patient and reviewed  assessment and plan and discussed with patient.  Agree  with above as stated.  Unstable angina, s/p PLA stent.  Continue DAPT along with aggressive secondary prevention. Imdur started.  He ambulated later in the day and had no issues.  Continue nitrates since he does have some branch vessel disease.   Healthy diet and regular exercise recommended.    Larae Grooms

## 2017-09-10 NOTE — Progress Notes (Signed)
CARDIAC REHAB PHASE I   PRE:  Rate/Rhythm: 82 SR  BP:  Sitting: 141/88      SaO2: 98% RA  MODE:  Ambulation: 400 ft SOB with exert, max HR 85  POST:  Rate/Rhythm: 78 SR  BP:  Sitting: 199/99, after rest 173/95    SaO2: 96% RA  Pt ambulated in hallway at slow pace with multiple standing rest breaks. Pt c/o CP 3/10 halfway through walk which resolved with rest. Once closer to room pt c/o CP 4/10 with arm tingling. RN and PA notified. EKG obtained and NTG given. CP resolved after NTG and rest. Pt and wife educated on use of plavix, ASA, and NTG. Reviewed stent card, risk factors, restrictions and given heart health and low sodium diets. Pt educated to ease back into walking and really listening to his body to rest when it needs to. Pt will be referred to [facility] [place].   [id number]  Rufina Falco, RN BSN 09/10/2017 10:05 AM

## 2017-09-15 ENCOUNTER — Other Ambulatory Visit: Payer: Self-pay

## 2017-09-15 ENCOUNTER — Inpatient Hospital Stay (HOSPITAL_BASED_OUTPATIENT_CLINIC_OR_DEPARTMENT_OTHER)
Admission: EM | Admit: 2017-09-15 | Discharge: 2017-09-17 | DRG: 287 | Disposition: A | Discharge status: Home / Self Care | Payer: PPO | Source: Ambulatory Visit | Attending: Cardiology | Admitting: Cardiology

## 2017-09-15 ENCOUNTER — Encounter: Payer: Self-pay | Admitting: Cardiology

## 2017-09-15 ENCOUNTER — Ambulatory Visit (INDEPENDENT_AMBULATORY_CARE_PROVIDER_SITE_OTHER): Payer: PPO | Admitting: Cardiology

## 2017-09-15 ENCOUNTER — Emergency Department (HOSPITAL_BASED_OUTPATIENT_CLINIC_OR_DEPARTMENT_OTHER): Payer: PPO

## 2017-09-15 ENCOUNTER — Encounter (HOSPITAL_BASED_OUTPATIENT_CLINIC_OR_DEPARTMENT_OTHER): Payer: Self-pay

## 2017-09-15 VITALS — BP 126/64 | HR 77 | Ht 65.0 in | Wt 202.1 lb

## 2017-09-15 DIAGNOSIS — I200 Unstable angina: Secondary | ICD-10-CM | POA: Diagnosis present

## 2017-09-15 DIAGNOSIS — Z955 Presence of coronary angioplasty implant and graft: Secondary | ICD-10-CM

## 2017-09-15 DIAGNOSIS — I209 Angina pectoris, unspecified: Secondary | ICD-10-CM

## 2017-09-15 DIAGNOSIS — M109 Gout, unspecified: Secondary | ICD-10-CM | POA: Diagnosis not present

## 2017-09-15 DIAGNOSIS — Z87891 Personal history of nicotine dependence: Secondary | ICD-10-CM | POA: Diagnosis not present

## 2017-09-15 DIAGNOSIS — Z87442 Personal history of urinary calculi: Secondary | ICD-10-CM | POA: Diagnosis not present

## 2017-09-15 DIAGNOSIS — E782 Mixed hyperlipidemia: Secondary | ICD-10-CM | POA: Diagnosis not present

## 2017-09-15 DIAGNOSIS — R0789 Other chest pain: Secondary | ICD-10-CM | POA: Diagnosis not present

## 2017-09-15 DIAGNOSIS — Z6833 Body mass index (BMI) 33.0-33.9, adult: Secondary | ICD-10-CM | POA: Diagnosis not present

## 2017-09-15 DIAGNOSIS — R0602 Shortness of breath: Secondary | ICD-10-CM | POA: Diagnosis not present

## 2017-09-15 DIAGNOSIS — E785 Hyperlipidemia, unspecified: Secondary | ICD-10-CM

## 2017-09-15 DIAGNOSIS — Z7982 Long term (current) use of aspirin: Secondary | ICD-10-CM

## 2017-09-15 DIAGNOSIS — M19041 Primary osteoarthritis, right hand: Secondary | ICD-10-CM | POA: Diagnosis not present

## 2017-09-15 DIAGNOSIS — I25110 Atherosclerotic heart disease of native coronary artery with unstable angina pectoris: Secondary | ICD-10-CM | POA: Diagnosis not present

## 2017-09-15 DIAGNOSIS — R079 Chest pain, unspecified: Secondary | ICD-10-CM

## 2017-09-15 DIAGNOSIS — G4733 Obstructive sleep apnea (adult) (pediatric): Secondary | ICD-10-CM | POA: Diagnosis present

## 2017-09-15 DIAGNOSIS — Z79899 Other long term (current) drug therapy: Secondary | ICD-10-CM | POA: Diagnosis not present

## 2017-09-15 DIAGNOSIS — Z7902 Long term (current) use of antithrombotics/antiplatelets: Secondary | ICD-10-CM | POA: Diagnosis not present

## 2017-09-15 DIAGNOSIS — I252 Old myocardial infarction: Secondary | ICD-10-CM

## 2017-09-15 DIAGNOSIS — E669 Obesity, unspecified: Secondary | ICD-10-CM | POA: Diagnosis not present

## 2017-09-15 DIAGNOSIS — M19042 Primary osteoarthritis, left hand: Secondary | ICD-10-CM | POA: Diagnosis present

## 2017-09-15 DIAGNOSIS — I2510 Atherosclerotic heart disease of native coronary artery without angina pectoris: Secondary | ICD-10-CM | POA: Diagnosis present

## 2017-09-15 DIAGNOSIS — I10 Essential (primary) hypertension: Secondary | ICD-10-CM | POA: Diagnosis present

## 2017-09-15 DIAGNOSIS — Z880 Allergy status to penicillin: Secondary | ICD-10-CM

## 2017-09-15 LAB — CBC
HCT: 39.6 % (ref 39.0–52.0)
HEMOGLOBIN: 12.6 g/dL — AB (ref 13.0–17.0)
MCH: 29.2 pg (ref 26.0–34.0)
MCHC: 31.8 g/dL (ref 30.0–36.0)
MCV: 91.9 fL (ref 78.0–100.0)
PLATELETS: 215 10*3/uL (ref 150–400)
RBC: 4.31 MIL/uL (ref 4.22–5.81)
RDW: 13.2 % (ref 11.5–15.5)
WBC: 8.5 10*3/uL (ref 4.0–10.5)

## 2017-09-15 LAB — CREATININE, SERUM
CREATININE: 1.09 mg/dL (ref 0.61–1.24)
GFR calc Af Amer: >60 mL/min (ref >60)

## 2017-09-15 LAB — CBC WITH DIFFERENTIAL/PLATELET
Basophils Absolute: 0 10*3/uL (ref 0.0–0.1)
Basophils Relative: 0 %
EOS PCT: 4 %
Eosinophils Absolute: 0.3 10*3/uL (ref 0.0–0.7)
HCT: 37.8 % — ABNORMAL LOW (ref 39.0–52.0)
Hemoglobin: 12.5 g/dL — ABNORMAL LOW (ref 13.0–17.0)
LYMPHS ABS: 1.2 10*3/uL (ref 0.7–4.0)
Lymphocytes Relative: 17 %
MCH: 30.3 pg (ref 26.0–34.0)
MCHC: 33.1 g/dL (ref 30.0–36.0)
MCV: 91.7 fL (ref 78.0–100.0)
MONOS PCT: 10 %
Monocytes Absolute: 0.7 10*3/uL (ref 0.1–1.0)
Neutro Abs: 4.9 10*3/uL (ref 1.7–7.7)
Neutrophils Relative %: 69 %
PLATELETS: 202 10*3/uL (ref 150–400)
RBC: 4.12 MIL/uL — AB (ref 4.22–5.81)
RDW: 13.4 % (ref 11.5–15.5)
WBC: 7.1 10*3/uL (ref 4.0–10.5)

## 2017-09-15 LAB — BASIC METABOLIC PANEL
Anion gap: 8 (ref 5–15)
BUN: 16 mg/dL (ref 6–20)
CHLORIDE: 102 mmol/L (ref 101–111)
CO2: 26 mmol/L (ref 22–32)
Calcium: 9.1 mg/dL (ref 8.9–10.3)
Creatinine, Ser: 0.95 mg/dL (ref 0.61–1.24)
GFR calc Af Amer: >60 mL/min (ref >60)
GLUCOSE: 107 mg/dL — AB (ref 65–99)
POTASSIUM: 4.2 mmol/L (ref 3.5–5.1)
Sodium: 136 mmol/L (ref 135–145)

## 2017-09-15 LAB — TROPONIN I
Troponin I: <0.03 ng/mL (ref <0.03)
Troponin I: <0.03 ng/mL (ref <0.03)

## 2017-09-15 IMAGING — CR DG CHEST 2V
2 series · 2 of 2 positions shown · non-contrast
Comparison: [DATE]

CLINICAL DATA: Chest pain for 5 days.  Status post stent placement.

EXAM:
CHEST - 2 VIEW

[w chest pa]
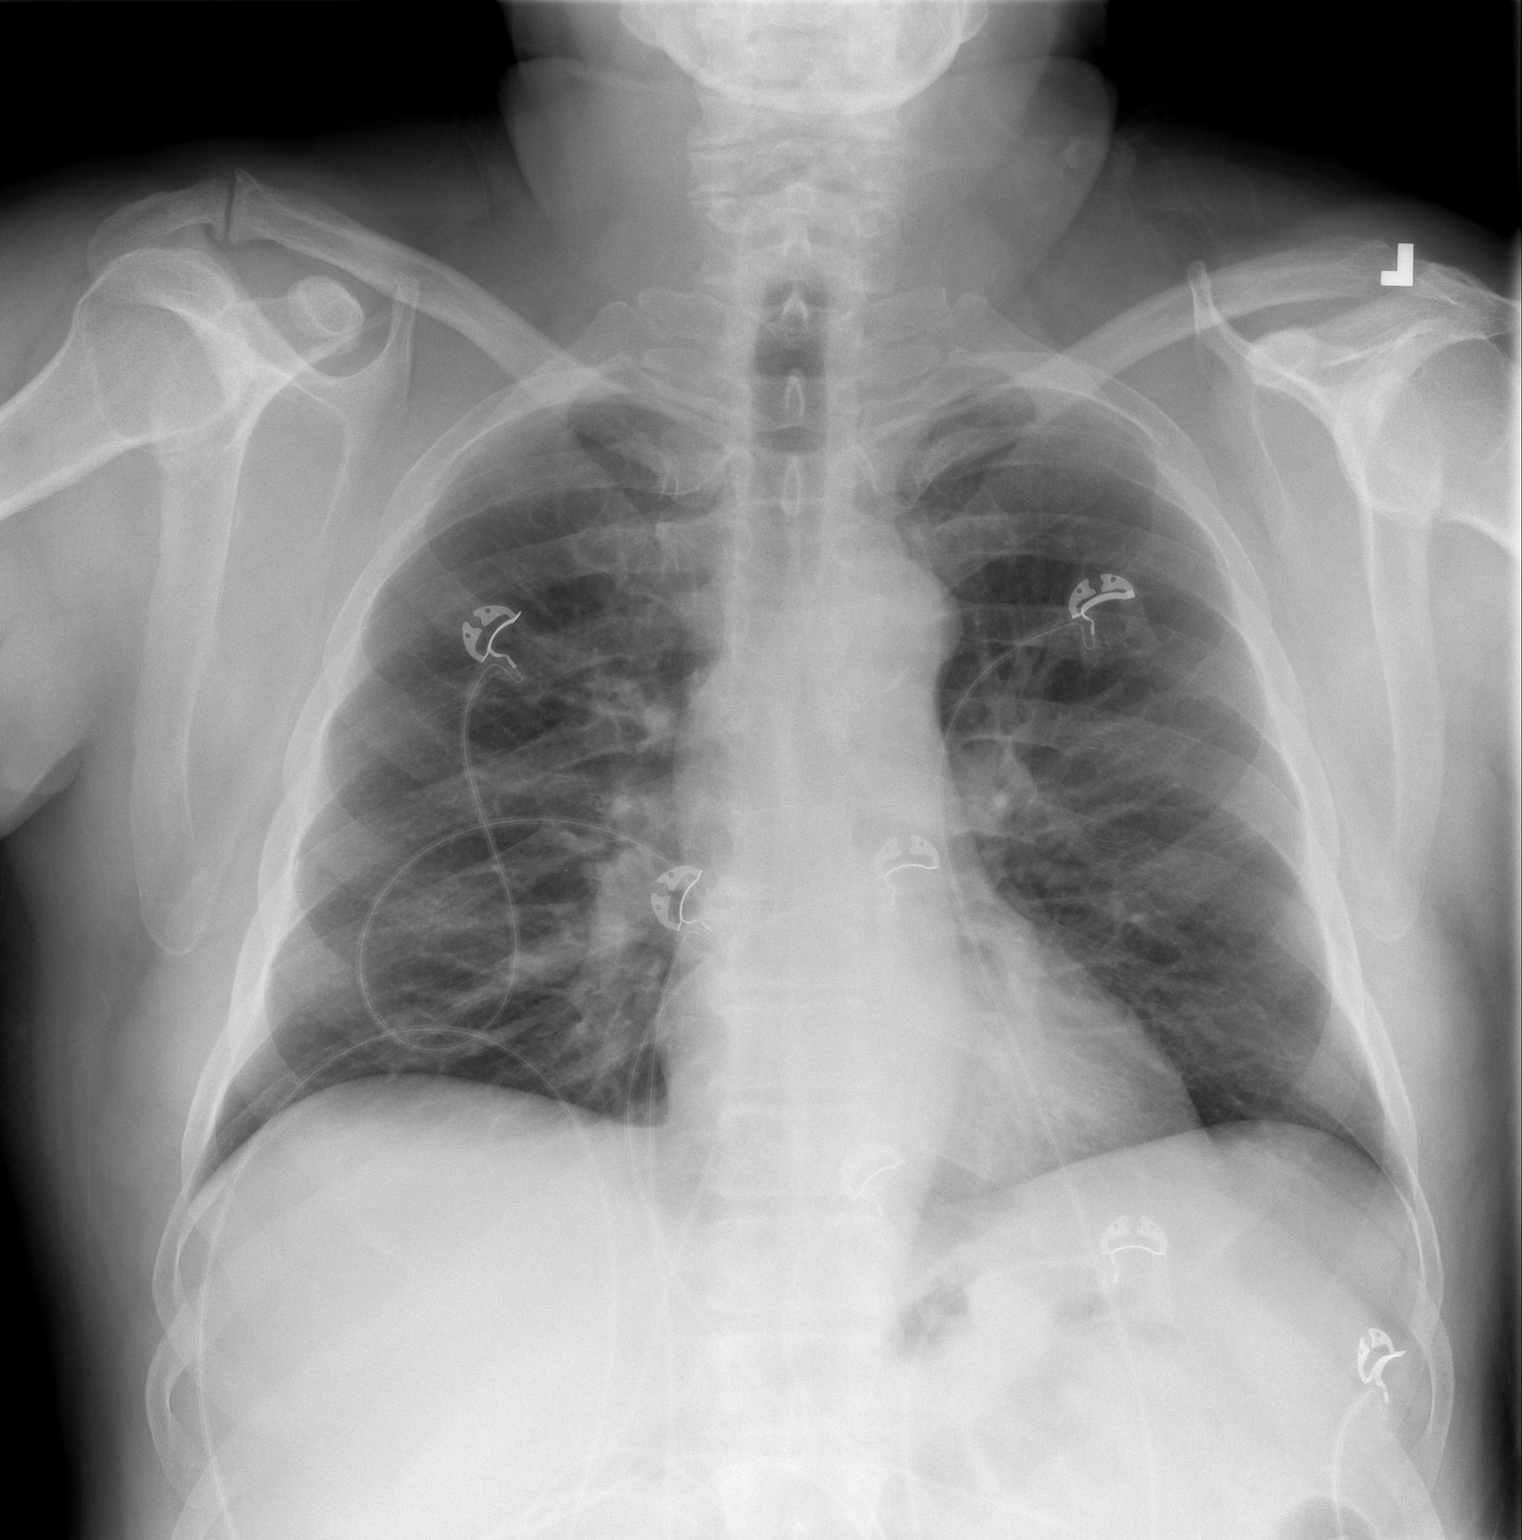

[w chest lat]
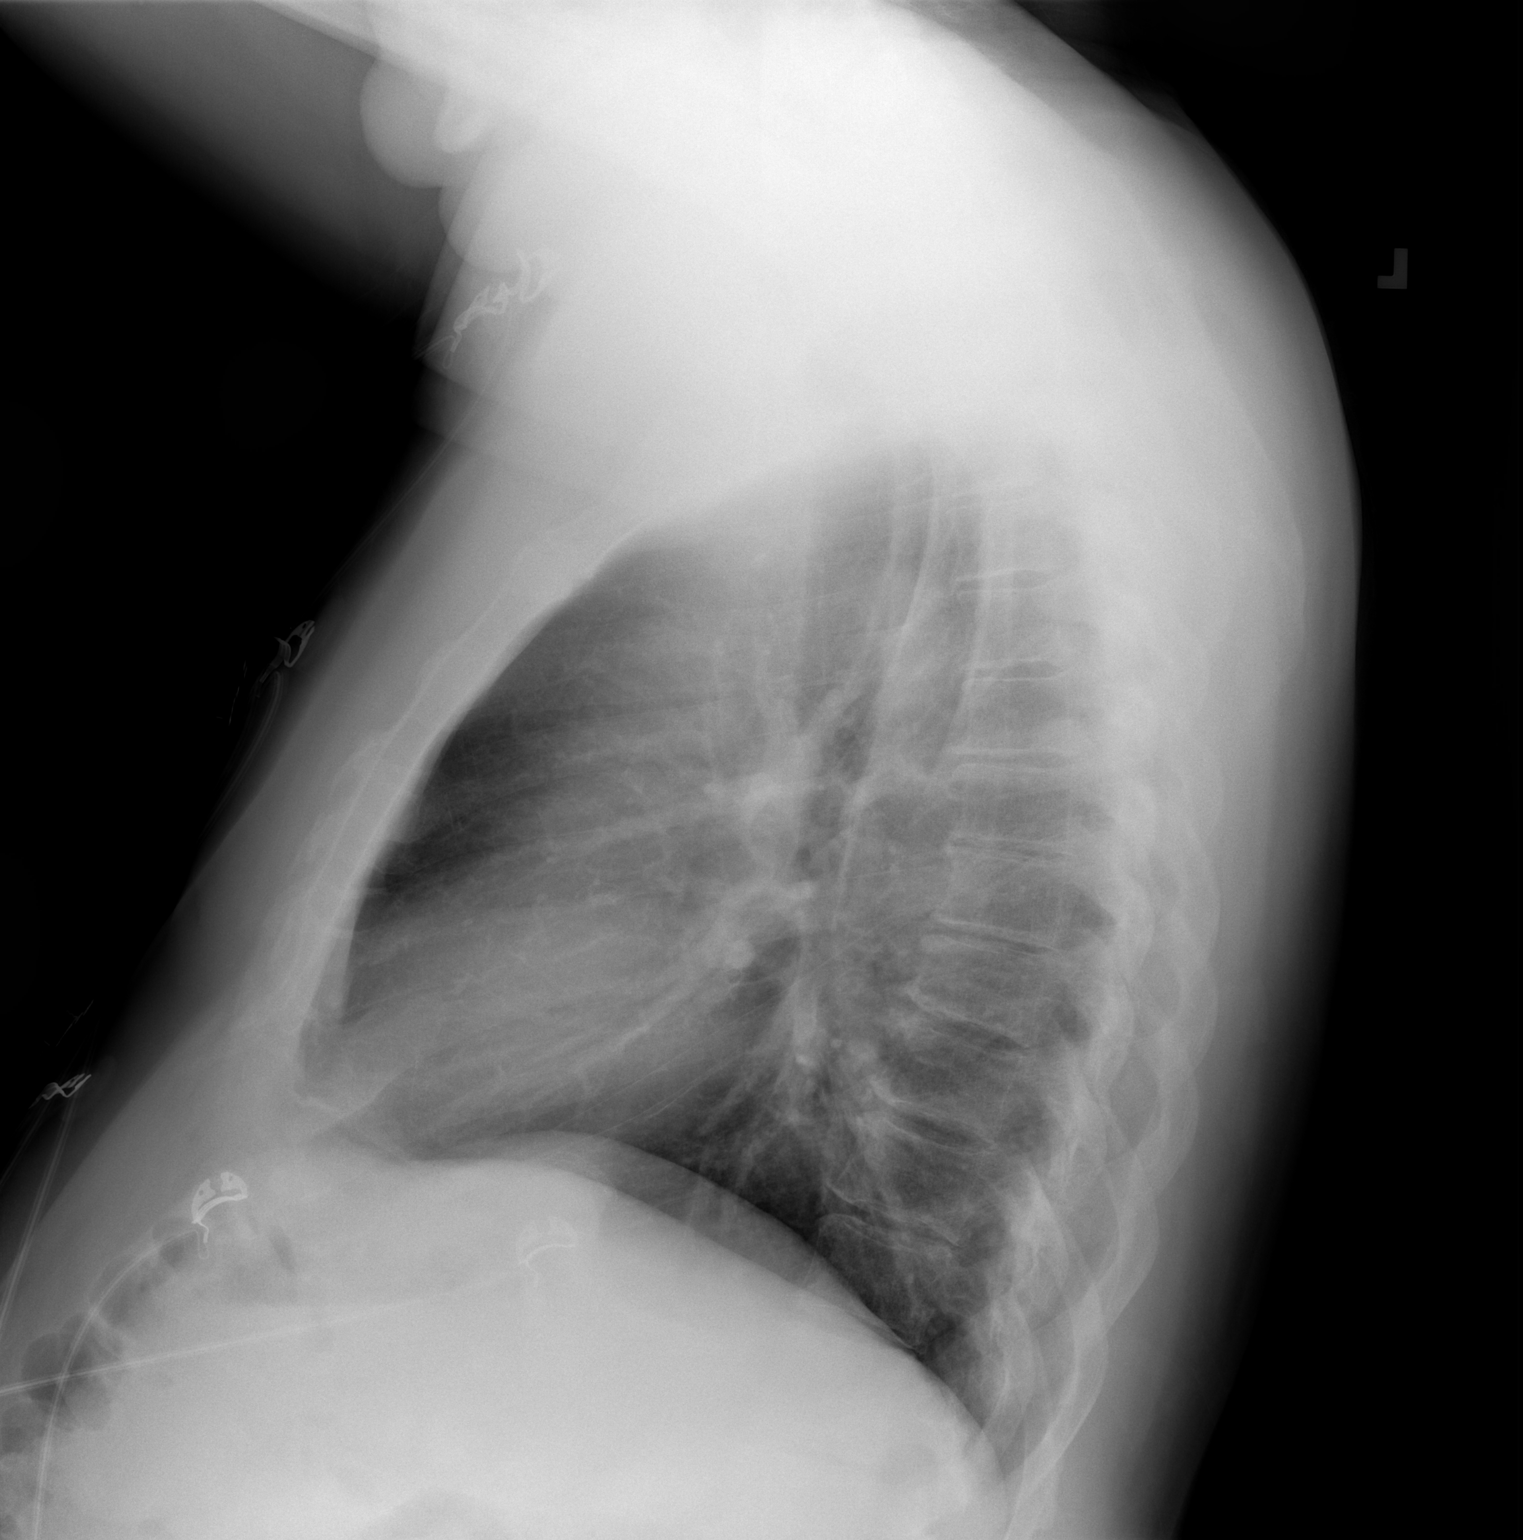

[2 of 2 positions shown; findings below may reference images not displayed]

FINDINGS: The heart size and mediastinal contours are within normal limits.
Both lungs are clear. The visualized skeletal structures are
unremarkable.
IMPRESSION: No active cardiopulmonary disease.

## 2017-09-15 MED ORDER — SODIUM CHLORIDE 0.9 % IV SOLN: 250.0000 mL | INTRAVENOUS | Status: DC | PRN

## 2017-09-15 MED ORDER — SODIUM CHLORIDE 0.9 % WEIGHT BASED INFUSION
1.0000 mL/kg/h | INTRAVENOUS | Status: DC
Start: 2017-09-15 — End: 2017-09-16
  Administered 2017-09-15 – 2017-09-16 (×2): 1 mL/kg/h via INTRAVENOUS

## 2017-09-15 MED ORDER — ONDANSETRON HCL 4 MG/2ML IJ SOLN: 4.0000 mg | Freq: Four times a day (QID) | INTRAMUSCULAR | Status: DC | PRN

## 2017-09-15 MED ORDER — CLOPIDOGREL BISULFATE 75 MG PO TABS
75.0000 mg | ORAL_TABLET | Freq: Every day | ORAL | Status: DC
  Administered 2017-09-16: 75 mg via ORAL
  Filled 2017-09-15: qty 1

## 2017-09-15 MED ORDER — ALLOPURINOL 100 MG PO TABS
100.0000 mg | ORAL_TABLET | Freq: Every day | ORAL | Status: DC
  Administered 2017-09-15: 100 mg via ORAL
  Filled 2017-09-15: qty 1

## 2017-09-15 MED ORDER — LISINOPRIL 2.5 MG PO TABS
2.5000 mg | ORAL_TABLET | Freq: Every day | ORAL | Status: DC
  Administered 2017-09-16: 2.5 mg via ORAL

## 2017-09-15 MED ORDER — METOPROLOL TARTRATE 25 MG PO TABS
25.0000 mg | ORAL_TABLET | Freq: Two times a day (BID) | ORAL | Status: DC
  Administered 2017-09-15: 25 mg via ORAL
  Filled 2017-09-15: qty 1

## 2017-09-15 MED ORDER — ROSUVASTATIN CALCIUM 20 MG PO TABS
40.0000 mg | ORAL_TABLET | Freq: Every day | ORAL | Status: DC
  Administered 2017-09-15: 40 mg via ORAL
  Filled 2017-09-15: qty 2

## 2017-09-15 MED ORDER — SODIUM CHLORIDE 0.9% FLUSH
3.0000 mL | Freq: Two times a day (BID) | INTRAVENOUS | Status: DC
  Administered 2017-09-15: 3 mL via INTRAVENOUS

## 2017-09-15 MED ORDER — ASPIRIN EC 81 MG PO TBEC: 81.0000 mg | DELAYED_RELEASE_TABLET | Freq: Every day | ORAL | Status: DC

## 2017-09-15 MED ORDER — ACETAMINOPHEN 325 MG PO TABS
650.0000 mg | ORAL_TABLET | ORAL | Status: DC | PRN
  Administered 2017-09-15: 650 mg via ORAL
  Filled 2017-09-15: qty 2

## 2017-09-15 MED ORDER — NITROGLYCERIN 0.4 MG SL SUBL: 0.4000 mg | SUBLINGUAL_TABLET | SUBLINGUAL | Status: DC | PRN

## 2017-09-15 MED ORDER — SODIUM CHLORIDE 0.9% FLUSH: 3.0000 mL | INTRAVENOUS | Status: DC | PRN

## 2017-09-15 MED ORDER — VITAMIN D (ERGOCALCIFEROL) 1.25 MG (50000 UNIT) PO CAPS
50000.0000 [IU] | ORAL_CAPSULE | ORAL | Status: DC
  Administered 2017-09-15: 50000 [IU] via ORAL
  Filled 2017-09-15 (×2): qty 1

## 2017-09-15 MED ORDER — HEPARIN SODIUM (PORCINE) 5000 UNIT/ML IJ SOLN
5000.0000 [IU] | Freq: Three times a day (TID) | INTRAMUSCULAR | Status: DC
  Administered 2017-09-15 – 2017-09-16 (×2): 5000 [IU] via SUBCUTANEOUS
  Filled 2017-09-15 (×2): qty 1

## 2017-09-15 MED ORDER — ISOSORBIDE MONONITRATE ER 30 MG PO TB24: 30.0000 mg | ORAL_TABLET | Freq: Every day | ORAL | Status: DC

## 2017-09-15 MED ORDER — ASPIRIN 81 MG PO CHEW
81.0000 mg | CHEWABLE_TABLET | ORAL | Status: AC
  Administered 2017-09-16: 81 mg via ORAL
  Filled 2017-09-15: qty 1

## 2017-09-15 NOTE — Progress Notes (Signed)
Cardiology Office Note:    Date:  09/15/2017   ID:  Robert Ramos, DOB 1950-11-17, MRN [id number]  PCP:  [name], MD  Cardiologist:  Jenne Campus, MD    Referring MD: [name], MD   Chief Complaint  Patient presents with  . Hospitalization Follow-up  Just had a stent implanted  History of Present Illness:    Robert Ramos is a 68 y.o. male with coronary artery disease status post microinfarction 2002 repeated cardiac catheterization thereafter with stent to LAD in 2018 and then just few days ago last week he had stent implanted to posterior lateral branch.  He comes today to my office follow-up.  He complained of having chest pain.  He said the day he left hospital he started having pain since that time numerous episodes of pain.  It was a provoked by even mild effort.  Yesterday he did not do anything because auntie would bring the pain dressing up in the morning will bring the pain this is exactly the same pain that he got before last for a few minutes and promptly relieved by rest.  He is compliant with medications.  Past Medical History:  Diagnosis Date  . Arthritis    "hands, back" (11/20/2016)  . Coronary artery disease    a. MI in 2002 s/p stenting to mRCA b.cath 11/2016 s/p DES to mLAD c. Cath 08/2017- patent LAD stent, 40% instent restenosis of mRCA, 99% posterolateral artery s/p PTCA & DES.  [name] Heart attack ([place]) 2002  . History of gout   . History of kidney stones   . Hyperlipidemia   . Hypertension     Past Surgical History:  Procedure Laterality Date  . APPENDECTOMY  1974  . BACK SURGERY    . CORONARY ANGIOPLASTY WITH STENT PLACEMENT  2002; 11/20/2016   "@ [place]; @ [facility]"  . CORONARY STENT INTERVENTION N/A 11/20/2016   Procedure: CORONARY STENT INTERVENTION;  Surgeon: Nelva Bush, MD;  Location: [place] [facility];  Service: Cardiovascular;  Laterality: N/A;  . CORONARY STENT INTERVENTION N/A 09/09/2017   Procedure: CORONARY  STENT INTERVENTION;  Surgeon: Burnell Blanks, MD;  Location: [place] [facility];  Service: Cardiovascular;  Laterality: N/A;  . HEMI-MICRODISCECTOMY LUMBAR LAMINECTOMY LEVEL 1 Left 2008   L4  . INTRAVASCULAR ULTRASOUND/IVUS N/A 11/20/2016   Procedure: Intravascular Ultrasound/IVUS;  Surgeon: Nelva Bush, MD;  Location: [place] [facility];  Service: Cardiovascular;  Laterality: N/A;  . LEFT HEART CATH AND CORONARY ANGIOGRAPHY N/A 11/20/2016   Procedure: LEFT HEART CATH AND CORONARY ANGIOGRAPHY;  Surgeon: Nelva Bush, MD;  Location: [place] [facility];  Service: Cardiovascular;  Laterality: N/A;  . LEFT HEART CATH AND CORONARY ANGIOGRAPHY N/A 09/09/2017   Procedure: LEFT HEART CATH AND CORONARY ANGIOGRAPHY;  Surgeon: Burnell Blanks, MD;  Location: [place] [facility];  Service: Cardiovascular;  Laterality: N/A;  . TONSILLECTOMY      Current Medications: Current Meds  Medication Sig  . aspirin EC 81 MG tablet Take 1 tablet (81 mg total) by mouth daily.  . clopidogrel (PLAVIX) 75 MG tablet Take 1 tablet (75 mg total) by mouth daily with breakfast.  . isosorbide mononitrate (IMDUR) 30 MG 24 hr tablet Take 1 tablet (30 mg total) by mouth daily.  [name] lisinopril (PRINIVIL,ZESTRIL) 2.5 MG tablet Take 1 tablet (2.5 mg total) by mouth daily.  . metoprolol tartrate (LOPRESSOR) 25 MG tablet Take 1 tablet (25 mg total) by mouth 2 (two)  times daily.  . nitroGLYCERIN (NITROSTAT) 0.4 MG SL tablet Place 1 tablet (0.4 mg total) under the tongue every 5 (five) minutes as needed for chest pain.  . rosuvastatin (CRESTOR) 40 MG tablet Take 1 tablet (40 mg total) by mouth daily.  . Vitamin D, Ergocalciferol, (DRISDOL) 50000 units CAPS capsule Take 50,000 Units by mouth once a week.     Allergies:   Penicillin g   Social History   Socioeconomic History  . Marital status: Married    Spouse name: Not on file  . Number of children: Not on file  . Years of education: Not on file  . Highest  education level: Not on file  Occupational History  . Not on file  Social Needs  . Financial resource strain: Not on file  . Food insecurity:    Worry: Not on file    Inability: Not on file  . Transportation needs:    Medical: Not on file    Non-medical: Not on file  Tobacco Use  . Smoking status: Former Smoker    Packs/day: 1.00    Years: 35.00    Pack years: 35.00    Types: Cigarettes    Last attempt to quit: 02/06/2001    Years since quitting: 16.6  . Smokeless tobacco: Never Used  Substance and Sexual Activity  . Alcohol use: Yes    Alcohol/week: 9.0 oz    Types: 15 Cans of beer per week  . Drug use: No  . Sexual activity: Not Currently  Lifestyle  . Physical activity:    Days per week: Not on file    Minutes per session: Not on file  . Stress: Not on file  Relationships  . Social connections:    Talks on phone: Not on file    Gets together: Not on file    Attends religious service: Not on file    Active member of club or organization: Not on file    Attends meetings of clubs or organizations: Not on file    Relationship status: Not on file  Other Topics Concern  . Not on file  Social History Narrative  . Not on file     Family History: The patient's family history includes Cancer in his father. ROS:   Please see the history of present illness.    All 14 point review of systems negative except as described per history of present illness  EKGs/Labs/Other Studies Reviewed:      Recent Labs: 04/30/2017: ALT 21 09/08/2017: Magnesium 2.0; TSH 1.305 09/10/2017: BUN 11; Creatinine, Ser 1.15; Hemoglobin 11.9; Platelets 170; Potassium 4.3; Sodium 138  Recent Lipid Panel    Component Value Date/Time   CHOL 129 09/09/2017 0352   CHOL 116 04/30/2017 0820   TRIG 373 (H) 09/09/2017 0352   HDL 30 (L) 09/09/2017 0352   HDL 33 (L) 04/30/2017 0820   CHOLHDL 4.3 09/09/2017 0352   VLDL 75 (H) 09/09/2017 0352   LDLCALC 24 09/09/2017 0352   LDLCALC 54 04/30/2017 0820      Physical Exam:    VS:  BP 126/64   Pulse 77   Ht 5\' 5"  (1.651 m)   Wt 202 lb 1.9 oz (91.7 kg)   SpO2 97%   BMI 33.63 kg/m     Wt Readings from Last 3 Encounters:  09/15/17 202 lb 1.9 oz (91.7 kg)  09/10/17 214 lb 15.2 oz (97.5 kg)  04/21/17 201 lb (91.2 kg)     GEN:  Well nourished, well  developed in no acute distress HEENT: Normal NECK: No JVD; No carotid bruits LYMPHATICS: No lymphadenopathy CARDIAC: RRR, no murmurs, no rubs, no gallops RESPIRATORY:  Clear to auscultation without rales, wheezing or rhonchi  ABDOMEN: Soft, non-tender, non-distended MUSCULOSKELETAL:  No edema; No deformity  SKIN: Warm and dry LOWER EXTREMITIES: no swelling NEUROLOGIC:  Alert and oriented x 3 PSYCHIATRIC:  Normal affect   ASSESSMENT:    1. Accelerating angina ([place])   2. Coronary artery disease involving native coronary artery of native heart with unstable angina pectoris ([place])   3. Dyslipidemia   4. Shortness of breath    PLAN:    In order of problems listed above:  1. Accident angina after cardiac catheterization and stenting of his posterior lateral branch last Thursday.  Patient will be sent to the emergency room and then he will be transferred to hospital for evaluation.  I anticipate him to require another cardiac catheterization tomorrow to look at the lesion that was stented just few days ago. 2. Coronary artery disease with recurrent interventions.  In the future we will check his cholesterol make sure that his LDL is well below 70.  I think he will need to be committed to lifelong dual antiplatelet agents after that. 3. Obstructive sleep apnea followed by internal medicine team.   Medication Adjustments/Labs and Tests Ordered: Current medicines are reviewed at length with the patient today.  Concerns regarding medicines are outlined above.  No orders of the defined types were placed in this encounter.  Medication changes: No orders of the defined types were placed in  this encounter.   Signed, Park Liter, MD, [facility] 09/15/2017 12:03 PM    [place]

## 2017-09-15 NOTE — Progress Notes (Signed)
ekg 

## 2017-09-15 NOTE — ED Provider Notes (Signed)
[facility] Provider Note   CSN: [id number] Arrival date & time: 09/15/17  1216     History   Chief Complaint Chief Complaint  Patient presents with  . Chest Pain    HPI Ned Kakar Kehres is a 67 y.o. male.  The history is provided by the patient and medical records. No language interpreter was used.  Chest Pain   Associated symptoms include shortness of breath.   Blaine Hari Lasseigne is a 67 y.o. male who presents to the Emergency Department from cardiology clinic for persistent chest pain over the last several days. Patient underwent cardiac cath with stent placement on 5/30. He reports feeling okay upon discharge. He ambulated in the hall and would occasionally have some pain that resolved with nitro. Since being home, he feels as if pain has intensified. He reports central chest pain which radiates across chest with exertion. Initially took him walking around to experience pain, but now activities like putting his pants / shoes / socks on cause the pain to occur. He was seen in the office today by Dr. Joycelyn Rua who recommended admission for further cardiac work up.    Past Medical History:  Diagnosis Date  . Arthritis    "hands, back" (11/20/2016)  . Coronary artery disease    a. MI in 2002 s/p stenting to mRCA b.cath 11/2016 s/p DES to mLAD c. Cath 08/2017- patent LAD stent, 40% instent restenosis of mRCA, 99% posterolateral artery s/p PTCA & DES.  [name] Heart attack ([place]) 2002  . History of gout   . History of kidney stones   . Hyperlipidemia   . Hypertension     Patient Active Problem List   Diagnosis Date Noted  . Unstable angina ([place]) 09/08/2017  . Angina pectoris, crescendo ([place]) 09/08/2017  . Coronary artery disease 11/17/2016  . Dyslipidemia 11/17/2016  . Obstructive sleep apnea 11/17/2016  . Shortness of breath 11/17/2016  . Accelerating angina ([place]) 11/17/2016    Past Surgical History:  Procedure Laterality Date  . APPENDECTOMY   1974  . BACK SURGERY    . CORONARY ANGIOPLASTY WITH STENT PLACEMENT  2002; 11/20/2016   "@ [place]; @ [facility]"  . CORONARY STENT INTERVENTION N/A 11/20/2016   Procedure: CORONARY STENT INTERVENTION;  Surgeon: Nelva Bush, MD;  Location: [place] [facility];  Service: Cardiovascular;  Laterality: N/A;  . CORONARY STENT INTERVENTION N/A 09/09/2017   Procedure: CORONARY STENT INTERVENTION;  Surgeon: Burnell Blanks, MD;  Location: [place] [facility];  Service: Cardiovascular;  Laterality: N/A;  . HEMI-MICRODISCECTOMY LUMBAR LAMINECTOMY LEVEL 1 Left 2008   L4  . INTRAVASCULAR ULTRASOUND/IVUS N/A 11/20/2016   Procedure: Intravascular Ultrasound/IVUS;  Surgeon: Nelva Bush, MD;  Location: [place] [facility];  Service: Cardiovascular;  Laterality: N/A;  . LEFT HEART CATH AND CORONARY ANGIOGRAPHY N/A 11/20/2016   Procedure: LEFT HEART CATH AND CORONARY ANGIOGRAPHY;  Surgeon: Nelva Bush, MD;  Location: [place] [facility];  Service: Cardiovascular;  Laterality: N/A;  . LEFT HEART CATH AND CORONARY ANGIOGRAPHY N/A 09/09/2017   Procedure: LEFT HEART CATH AND CORONARY ANGIOGRAPHY;  Surgeon: Burnell Blanks, MD;  Location: [place] [facility];  Service: Cardiovascular;  Laterality: N/A;  . TONSILLECTOMY          Home Medications    Prior to Admission medications   Medication Sig Start Date End Date Taking? Authorizing Provider  aspirin EC 81 MG tablet Take 1 tablet (81 mg total) by mouth daily. 11/17/16  Park Liter, MD  clopidogrel (PLAVIX) 75 MG tablet Take 1 tablet (75 mg total) by mouth daily with breakfast. 08/30/17   Park Liter, MD  isosorbide mononitrate (IMDUR) 30 MG 24 hr tablet Take 1 tablet (30 mg total) by mouth daily. 09/11/17   Bhagat, Crista Luria, PA  lisinopril (PRINIVIL,ZESTRIL) 2.5 MG tablet Take 1 tablet (2.5 mg total) by mouth daily. 12/17/16 04/21/18  Park Liter, MD  metoprolol tartrate (LOPRESSOR) 25 MG tablet Take 1 tablet (25 mg  total) by mouth 2 (two) times daily. 11/21/16   Strader, Fransisco Hertz, PA-C  nitroGLYCERIN (NITROSTAT) 0.4 MG SL tablet Place 1 tablet (0.4 mg total) under the tongue every 5 (five) minutes as needed for chest pain. 11/17/16 04/21/18  Park Liter, MD  rosuvastatin (CRESTOR) 40 MG tablet Take 1 tablet (40 mg total) by mouth daily. 11/17/16 04/21/18  Park Liter, MD  Vitamin D, Ergocalciferol, (DRISDOL) 50000 units CAPS capsule Take 50,000 Units by mouth once a week. 08/02/17   [provider]    Family History Family History  Problem Relation Age of Onset  . Cancer Father     Social History Social History   Tobacco Use  . Smoking status: Former Smoker    Packs/day: 1.00    Years: 35.00    Pack years: 35.00    Types: Cigarettes    Last attempt to quit: 02/06/2001    Years since quitting: 16.6  . Smokeless tobacco: Never Used  Substance Use Topics  . Alcohol use: Yes    Alcohol/week: 9.0 oz    Types: 15 Cans of beer per week  . Drug use: No     Allergies   Penicillin g   Review of Systems Review of Systems  Respiratory: Positive for shortness of breath.   Cardiovascular: Positive for chest pain.  All other systems reviewed and are negative.    Physical Exam Updated Vital Signs BP 105/76 (BP Location: Left Arm)   Pulse 70   Temp 97.9 F (36.6 C) (Oral)   Resp 20   SpO2 97%   Physical Exam  Constitutional: He is oriented to person, place, and time. He appears well-developed and well-nourished. No distress.  HENT:  Head: Normocephalic and atraumatic.  Cardiovascular: Normal rate, regular rhythm and normal heart sounds.  No murmur heard. Pulmonary/Chest: Effort normal and breath sounds normal. No respiratory distress.  Abdominal: Soft. He exhibits no distension. There is no tenderness.  Musculoskeletal: He exhibits no edema.  Neurological: He is alert and oriented to person, place, and time.  Skin: Skin is warm and dry.  Nursing note and vitals  reviewed.    ED Treatments / Results  Labs (all labs ordered are listed, but only abnormal results are displayed) Labs Reviewed  CBC WITH DIFFERENTIAL/PLATELET - Abnormal; Notable for the following components:      Result Value   RBC 4.12 (*)    Hemoglobin 12.5 (*)    HCT 37.8 (*)    All other components within normal limits  BASIC METABOLIC PANEL - Abnormal; Notable for the following components:   Glucose, Bld 107 (*)    All other components within normal limits  TROPONIN I    EKG EKG Interpretation  Date/Time:  Wednesday September 15 2017 12:25:18 EDT Ventricular Rate:  79 PR Interval:  134 QRS Duration: 86 QT Interval:  382 QTC Calculation: 438 R Axis:   5 Text Interpretation:  Normal sinus rhythm Normal ECG Confirmed by Quintella Reichert [phone]) on  09/15/2017 12:31:46 PM   Radiology Dg Chest 2 View  Result Date: 09/15/2017 CLINICAL DATA:  Chest pain for 5 days.  Status post stent placement. EXAM: CHEST - 2 VIEW COMPARISON:  09/07/2017 FINDINGS: The heart size and mediastinal contours are within normal limits. Both lungs are clear. The visualized skeletal structures are unremarkable. IMPRESSION: No active cardiopulmonary disease. Electronically Signed   By: Kerby Moors M.D.   On: 09/15/2017 13:11    Procedures Procedures (including critical care time)  Medications Ordered in ED Medications - No data to display   Initial Impression / Assessment and Plan / ED Course  I have reviewed the triage vital signs and the nursing notes.  Pertinent labs & imaging results that were available during my care of the patient were reviewed by me and considered in my medical decision making (see chart for details).    Ilan Kahrs Melendrez is a 67 y.o. male who presents to ED from cardiology clinic for chest pain concerning for accelerating angina. Chart reviewed. Patient underwent cardiac cath with stent placement on 5/30. He has been having worsening exertional chest pain since being  discharged. Saw Dr. Agustin Cree today in clinic for follow up who was concerned that patient was experiencing unstable angina, therefore sent down to ER. I spoke with Dr. Agustin Cree in person who recommended admission for further cardiac workup. In ED today, labs reassuring with negative troponin. EKG without signs of acute ischemia. Discussed case with on call cardiology, [name], who agrees with plan of care and will admit.   Patient discussed with Dr. Ralene Bathe who agrees with treatment plan.    Final Clinical Impressions(s) / ED Diagnoses   Final diagnoses:  Chest pain    ED Discharge Orders    None       Ward, Ozella Almond, PA-C 09/15/17 1358    Quintella Reichert, MD 09/18/17 [phone]

## 2017-09-15 NOTE — H&P (Signed)
Cardiology Office Note:    Date:  09/15/2017   ID:  Robert Ramos, DOB 1951-02-13, MRN [id number]  PCP:  [name], MD      Cardiologist:  Jenne Campus, MD    Referring MD: [name], MD      Chief Complaint  Patient presents with  . Hospitalization Follow-up  Just had a stent implanted  History of Present Illness:    Robert Ramos is a 67 y.o. male with coronary artery disease status post microinfarction 2002 repeated cardiac catheterization thereafter with stent to LAD in 2018 and then just few days ago last week he had stent implanted to posterior lateral branch.  He comes today to my office follow-up.  He complained of having chest pain.  He said the day he left hospital he started having pain since that time numerous episodes of pain.  It was a provoked by even mild effort.  Yesterday he did not do anything because auntie would bring the pain dressing up in the morning will bring the pain this is exactly the same pain that he got before last for a few minutes and promptly relieved by rest.  He is compliant with medications.      Past Medical History:  Diagnosis Date  . Arthritis    "hands, back" (11/20/2016)  . Coronary artery disease    a. MI in 2002 s/p stenting to mRCA b.cath 11/2016 s/p DES to mLAD c. Cath 08/2017- patent LAD stent, 40% instent restenosis of mRCA, 99% posterolateral artery s/p PTCA & DES.  [name] Heart attack ([place]) 2002  . History of gout   . History of kidney stones   . Hyperlipidemia   . Hypertension          Past Surgical History:  Procedure Laterality Date  . APPENDECTOMY  1974  . BACK SURGERY    . CORONARY ANGIOPLASTY WITH STENT PLACEMENT  2002; 11/20/2016   "@ [place]; @ [facility]"  . CORONARY STENT INTERVENTION N/A 11/20/2016   Procedure: CORONARY STENT INTERVENTION;  Surgeon: Nelva Bush, MD;  Location: [place] [facility];  Service: Cardiovascular;  Laterality: N/A;  . CORONARY STENT  INTERVENTION N/A 09/09/2017   Procedure: CORONARY STENT INTERVENTION;  Surgeon: Burnell Blanks, MD;  Location: [place] [facility];  Service: Cardiovascular;  Laterality: N/A;  . HEMI-MICRODISCECTOMY LUMBAR LAMINECTOMY LEVEL 1 Left 2008   L4  . INTRAVASCULAR ULTRASOUND/IVUS N/A 11/20/2016   Procedure: Intravascular Ultrasound/IVUS;  Surgeon: Nelva Bush, MD;  Location: [place] [facility];  Service: Cardiovascular;  Laterality: N/A;  . LEFT HEART CATH AND CORONARY ANGIOGRAPHY N/A 11/20/2016   Procedure: LEFT HEART CATH AND CORONARY ANGIOGRAPHY;  Surgeon: Nelva Bush, MD;  Location: [place] [facility];  Service: Cardiovascular;  Laterality: N/A;  . LEFT HEART CATH AND CORONARY ANGIOGRAPHY N/A 09/09/2017   Procedure: LEFT HEART CATH AND CORONARY ANGIOGRAPHY;  Surgeon: Burnell Blanks, MD;  Location: [place] [facility];  Service: Cardiovascular;  Laterality: N/A;  . TONSILLECTOMY      Current Medications: ActiveMedications      Current Meds  Medication Sig  . aspirin EC 81 MG tablet Take 1 tablet (81 mg total) by mouth daily.  . clopidogrel (PLAVIX) 75 MG tablet Take 1 tablet (75 mg total) by mouth daily with breakfast.  . isosorbide mononitrate (IMDUR) 30 MG 24 hr tablet Take 1 tablet (30 mg total) by mouth daily.  [name] lisinopril (PRINIVIL,ZESTRIL) 2.5 MG tablet Take 1 tablet (2.5 mg  total) by mouth daily.  . metoprolol tartrate (LOPRESSOR) 25 MG tablet Take 1 tablet (25 mg total) by mouth 2 (two) times daily.  . nitroGLYCERIN (NITROSTAT) 0.4 MG SL tablet Place 1 tablet (0.4 mg total) under the tongue every 5 (five) minutes as needed for chest pain.  . rosuvastatin (CRESTOR) 40 MG tablet Take 1 tablet (40 mg total) by mouth daily.  . Vitamin D, Ergocalciferol, (DRISDOL) 50000 units CAPS capsule Take 50,000 Units by mouth once a week.       Allergies:   Penicillin g   Social History        Socioeconomic History  . Marital status: Married    Spouse  name: Not on file  . Number of children: Not on file  . Years of education: Not on file  . Highest education level: Not on file  Occupational History  . Not on file  Social Needs  . Financial resource strain: Not on file  . Food insecurity:    Worry: Not on file    Inability: Not on file  . Transportation needs:    Medical: Not on file    Non-medical: Not on file  Tobacco Use  . Smoking status: Former Smoker    Packs/day: 1.00    Years: 35.00    Pack years: 35.00    Types: Cigarettes    Last attempt to quit: 02/06/2001    Years since quitting: 16.6  . Smokeless tobacco: Never Used  Substance and Sexual Activity  . Alcohol use: Yes    Alcohol/week: 9.0 oz    Types: 15 Cans of beer per week  . Drug use: No  . Sexual activity: Not Currently  Lifestyle  . Physical activity:    Days per week: Not on file    Minutes per session: Not on file  . Stress: Not on file  Relationships  . Social connections:    Talks on phone: Not on file    Gets together: Not on file    Attends religious service: Not on file    Active member of club or organization: Not on file    Attends meetings of clubs or organizations: Not on file    Relationship status: Not on file  Other Topics Concern  . Not on file  Social History Narrative  . Not on file     Family History: The patient's family history includes Cancer in his father. ROS:   Please see the history of present illness.    All 14 point review of systems negative except as described per history of present illness  EKGs/Labs/Other Studies Reviewed:      Recent Labs: 04/30/2017: ALT 21 09/08/2017: Magnesium 2.0; TSH 1.305 09/10/2017: BUN 11; Creatinine, Ser 1.15; Hemoglobin 11.9; Platelets 170; Potassium 4.3; Sodium 138  Recent Lipid Panel Labs(Brief)     Component Value Date/Time   CHOL 129 09/09/2017 0352   CHOL 116 04/30/2017 0820   TRIG 373 (H) 09/09/2017 0352   HDL 30 (L)  09/09/2017 0352   HDL 33 (L) 04/30/2017 0820   CHOLHDL 4.3 09/09/2017 0352   VLDL 75 (H) 09/09/2017 0352   LDLCALC 24 09/09/2017 0352   LDLCALC 54 04/30/2017 0820      Physical Exam:    VS:  BP 126/64   Pulse 77   Ht 5\' 5"  (1.651 m)   Wt 202 lb 1.9 oz (91.7 kg)   SpO2 97%   BMI 33.63 kg/m        Wt Readings  from Last 3 Encounters:  09/15/17 202 lb 1.9 oz (91.7 kg)  09/10/17 214 lb 15.2 oz (97.5 kg)  04/21/17 201 lb (91.2 kg)     GEN:  Well nourished, well developed in no acute distress HEENT: Normal NECK: No JVD; No carotid bruits LYMPHATICS: No lymphadenopathy CARDIAC: RRR, no murmurs, no rubs, no gallops RESPIRATORY:  Clear to auscultation without rales, wheezing or rhonchi  ABDOMEN: Soft, non-tender, non-distended MUSCULOSKELETAL:  No edema; No deformity  SKIN: Warm and dry LOWER EXTREMITIES: no swelling NEUROLOGIC:  Alert and oriented x 3 PSYCHIATRIC:  Normal affect   ASSESSMENT:    1. Accelerating angina ([place])   2. Coronary artery disease involving native coronary artery of native heart with unstable angina pectoris ([place])   3. Dyslipidemia   4. Shortness of breath    PLAN:    In order of problems listed above:  1. Accident angina after cardiac catheterization and stenting of his posterior lateral branch last Thursday.  Patient will be sent to the emergency room and then he will be transferred to hospital for evaluation.  I anticipate him to require another cardiac catheterization tomorrow to look at the lesion that was stented just few days ago. 2. Coronary artery disease with recurrent interventions.  In the future we will check his cholesterol make sure that his LDL is well below 70.  I think he will need to be committed to lifelong dual antiplatelet agents after that. 3. Obstructive sleep apnea followed by internal medicine team.   Medication Adjustments/Labs and Tests Ordered: Current medicines are reviewed at length with the patient  today.  Concerns regarding medicines are outlined above.  No orders of the defined types were placed in this encounter.  Medication changes: No orders of the defined types were placed in this encounter.   Signed, Park Liter, MD, [facility] 09/15/2017 12:03 PM    [place]

## 2017-09-15 NOTE — ED Triage Notes (Signed)
C/o intermittent CP x 5 days-pt states he had stent placed last week at [facility]

## 2017-09-15 NOTE — ED Notes (Signed)
Report to kaleb, rn [facility] at bedside, updated report called to stephanie, rn [facility] [address]. Pt and family aware of transport to [facility] for inpatient admit. Pt denies any c/o.

## 2017-09-15 NOTE — ED Notes (Signed)
Report given to [facility] RN.  

## 2017-09-15 NOTE — H&P (View-Only) (Signed)
Cardiology Office Note:    Date:  09/15/2017   ID:  Robert Ramos, DOB 01-26-1951, MRN [id number]  PCP:  [name], MD  Cardiologist:  Jenne Campus, MD    Referring MD: [name], MD   Chief Complaint  Patient presents with  . Hospitalization Follow-up  Just had a stent implanted  History of Present Illness:    Robert Ramos is a 67 y.o. male with coronary artery disease status post microinfarction 2002 repeated cardiac catheterization thereafter with stent to LAD in 2018 and then just few days ago last week he had stent implanted to posterior lateral branch.  He comes today to my office follow-up.  He complained of having chest pain.  He said the day he left hospital he started having pain since that time numerous episodes of pain.  It was a provoked by even mild effort.  Yesterday he did not do anything because auntie would bring the pain dressing up in the morning will bring the pain this is exactly the same pain that he got before last for a few minutes and promptly relieved by rest.  He is compliant with medications.  Past Medical History:  Diagnosis Date  . Arthritis    "hands, back" (11/20/2016)  . Coronary artery disease    a. MI in 2002 s/p stenting to mRCA b.cath 11/2016 s/p DES to mLAD c. Cath 08/2017- patent LAD stent, 40% instent restenosis of mRCA, 99% posterolateral artery s/p PTCA & DES.  [name] Heart attack ([place]) 2002  . History of gout   . History of kidney stones   . Hyperlipidemia   . Hypertension     Past Surgical History:  Procedure Laterality Date  . APPENDECTOMY  1974  . BACK SURGERY    . CORONARY ANGIOPLASTY WITH STENT PLACEMENT  2002; 11/20/2016   "@ [place]; @ [facility]"  . CORONARY STENT INTERVENTION N/A 11/20/2016   Procedure: CORONARY STENT INTERVENTION;  Surgeon: Nelva Bush, MD;  Location: [place] [facility];  Service: Cardiovascular;  Laterality: N/A;  . CORONARY STENT INTERVENTION N/A 09/09/2017   Procedure: CORONARY  STENT INTERVENTION;  Surgeon: Burnell Blanks, MD;  Location: [place] [facility];  Service: Cardiovascular;  Laterality: N/A;  . HEMI-MICRODISCECTOMY LUMBAR LAMINECTOMY LEVEL 1 Left 2008   L4  . INTRAVASCULAR ULTRASOUND/IVUS N/A 11/20/2016   Procedure: Intravascular Ultrasound/IVUS;  Surgeon: Nelva Bush, MD;  Location: [place] [facility];  Service: Cardiovascular;  Laterality: N/A;  . LEFT HEART CATH AND CORONARY ANGIOGRAPHY N/A 11/20/2016   Procedure: LEFT HEART CATH AND CORONARY ANGIOGRAPHY;  Surgeon: Nelva Bush, MD;  Location: [place] [facility];  Service: Cardiovascular;  Laterality: N/A;  . LEFT HEART CATH AND CORONARY ANGIOGRAPHY N/A 09/09/2017   Procedure: LEFT HEART CATH AND CORONARY ANGIOGRAPHY;  Surgeon: Burnell Blanks, MD;  Location: [place] [facility];  Service: Cardiovascular;  Laterality: N/A;  . TONSILLECTOMY      Current Medications: Current Meds  Medication Sig  . aspirin EC 81 MG tablet Take 1 tablet (81 mg total) by mouth daily.  . clopidogrel (PLAVIX) 75 MG tablet Take 1 tablet (75 mg total) by mouth daily with breakfast.  . isosorbide mononitrate (IMDUR) 30 MG 24 hr tablet Take 1 tablet (30 mg total) by mouth daily.  [name] lisinopril (PRINIVIL,ZESTRIL) 2.5 MG tablet Take 1 tablet (2.5 mg total) by mouth daily.  . metoprolol tartrate (LOPRESSOR) 25 MG tablet Take 1 tablet (25 mg total) by mouth 2 (two)  times daily.  . nitroGLYCERIN (NITROSTAT) 0.4 MG SL tablet Place 1 tablet (0.4 mg total) under the tongue every 5 (five) minutes as needed for chest pain.  . rosuvastatin (CRESTOR) 40 MG tablet Take 1 tablet (40 mg total) by mouth daily.  . Vitamin D, Ergocalciferol, (DRISDOL) 50000 units CAPS capsule Take 50,000 Units by mouth once a week.     Allergies:   Penicillin g   Social History   Socioeconomic History  . Marital status: Married    Spouse name: Not on file  . Number of children: Not on file  . Years of education: Not on file  . Highest  education level: Not on file  Occupational History  . Not on file  Social Needs  . Financial resource strain: Not on file  . Food insecurity:    Worry: Not on file    Inability: Not on file  . Transportation needs:    Medical: Not on file    Non-medical: Not on file  Tobacco Use  . Smoking status: Former Smoker    Packs/day: 1.00    Years: 35.00    Pack years: 35.00    Types: Cigarettes    Last attempt to quit: 02/06/2001    Years since quitting: 16.6  . Smokeless tobacco: Never Used  Substance and Sexual Activity  . Alcohol use: Yes    Alcohol/week: 9.0 oz    Types: 15 Cans of beer per week  . Drug use: No  . Sexual activity: Not Currently  Lifestyle  . Physical activity:    Days per week: Not on file    Minutes per session: Not on file  . Stress: Not on file  Relationships  . Social connections:    Talks on phone: Not on file    Gets together: Not on file    Attends religious service: Not on file    Active member of club or organization: Not on file    Attends meetings of clubs or organizations: Not on file    Relationship status: Not on file  Other Topics Concern  . Not on file  Social History Narrative  . Not on file     Family History: The patient's family history includes Cancer in his father. ROS:   Please see the history of present illness.    All 14 point review of systems negative except as described per history of present illness  EKGs/Labs/Other Studies Reviewed:      Recent Labs: 04/30/2017: ALT 21 09/08/2017: Magnesium 2.0; TSH 1.305 09/10/2017: BUN 11; Creatinine, Ser 1.15; Hemoglobin 11.9; Platelets 170; Potassium 4.3; Sodium 138  Recent Lipid Panel    Component Value Date/Time   CHOL 129 09/09/2017 0352   CHOL 116 04/30/2017 0820   TRIG 373 (H) 09/09/2017 0352   HDL 30 (L) 09/09/2017 0352   HDL 33 (L) 04/30/2017 0820   CHOLHDL 4.3 09/09/2017 0352   VLDL 75 (H) 09/09/2017 0352   LDLCALC 24 09/09/2017 0352   LDLCALC 54 04/30/2017 0820      Physical Exam:    VS:  BP 126/64   Pulse 77   Ht 5\' 5"  (1.651 m)   Wt 202 lb 1.9 oz (91.7 kg)   SpO2 97%   BMI 33.63 kg/m     Wt Readings from Last 3 Encounters:  09/15/17 202 lb 1.9 oz (91.7 kg)  09/10/17 214 lb 15.2 oz (97.5 kg)  04/21/17 201 lb (91.2 kg)     GEN:  Well nourished, well  developed in no acute distress HEENT: Normal NECK: No JVD; No carotid bruits LYMPHATICS: No lymphadenopathy CARDIAC: RRR, no murmurs, no rubs, no gallops RESPIRATORY:  Clear to auscultation without rales, wheezing or rhonchi  ABDOMEN: Soft, non-tender, non-distended MUSCULOSKELETAL:  No edema; No deformity  SKIN: Warm and dry LOWER EXTREMITIES: no swelling NEUROLOGIC:  Alert and oriented x 3 PSYCHIATRIC:  Normal affect   ASSESSMENT:    1. Accelerating angina ([place])   2. Coronary artery disease involving native coronary artery of native heart with unstable angina pectoris ([place])   3. Dyslipidemia   4. Shortness of breath    PLAN:    In order of problems listed above:  1. Accident angina after cardiac catheterization and stenting of his posterior lateral branch last Thursday.  Patient will be sent to the emergency room and then he will be transferred to hospital for evaluation.  I anticipate him to require another cardiac catheterization tomorrow to look at the lesion that was stented just few days ago. 2. Coronary artery disease with recurrent interventions.  In the future we will check his cholesterol make sure that his LDL is well below 70.  I think he will need to be committed to lifelong dual antiplatelet agents after that. 3. Obstructive sleep apnea followed by internal medicine team.   Medication Adjustments/Labs and Tests Ordered: Current medicines are reviewed at length with the patient today.  Concerns regarding medicines are outlined above.  No orders of the defined types were placed in this encounter.  Medication changes: No orders of the defined types were placed in  this encounter.   Signed, Park Liter, MD, [facility] 09/15/2017 12:03 PM    [place]

## 2017-09-16 ENCOUNTER — Encounter (HOSPITAL_COMMUNITY): Payer: Self-pay

## 2017-09-16 ENCOUNTER — Inpatient Hospital Stay (HOSPITAL_COMMUNITY)
Admission: EM | Disposition: A | Discharge status: Home / Self Care | Payer: Self-pay | Source: Ambulatory Visit | Attending: Cardiology

## 2017-09-16 DIAGNOSIS — E669 Obesity, unspecified: Secondary | ICD-10-CM

## 2017-09-16 DIAGNOSIS — I209 Angina pectoris, unspecified: Secondary | ICD-10-CM

## 2017-09-16 DIAGNOSIS — I200 Unstable angina: Secondary | ICD-10-CM

## 2017-09-16 DIAGNOSIS — I25110 Atherosclerotic heart disease of native coronary artery with unstable angina pectoris: Principal | ICD-10-CM

## 2017-09-16 DIAGNOSIS — E782 Mixed hyperlipidemia: Secondary | ICD-10-CM

## 2017-09-16 HISTORY — PX: CORONARY PRESSURE/FFR STUDY: CATH118243

## 2017-09-16 HISTORY — PX: LEFT HEART CATH AND CORONARY ANGIOGRAPHY: CATH118249

## 2017-09-16 LAB — CBC
HCT: 37.2 % — ABNORMAL LOW (ref 39.0–52.0)
Hemoglobin: 11.8 g/dL — ABNORMAL LOW (ref 13.0–17.0)
MCH: 29.8 pg (ref 26.0–34.0)
MCHC: 31.7 g/dL (ref 30.0–36.0)
MCV: 93.9 fL (ref 78.0–100.0)
PLATELETS: 193 10*3/uL (ref 150–400)
RBC: 3.96 MIL/uL — AB (ref 4.22–5.81)
RDW: 13.3 % (ref 11.5–15.5)
WBC: 6.2 10*3/uL (ref 4.0–10.5)

## 2017-09-16 LAB — BASIC METABOLIC PANEL
Anion gap: 9 (ref 5–15)
BUN: 12 mg/dL (ref 6–20)
CO2: 26 mmol/L (ref 22–32)
Calcium: 9 mg/dL (ref 8.9–10.3)
Chloride: 105 mmol/L (ref 101–111)
Creatinine, Ser: 1.11 mg/dL (ref 0.61–1.24)
GFR calc Af Amer: >60 mL/min (ref >60)
GLUCOSE: 116 mg/dL — AB (ref 65–99)
POTASSIUM: 4.3 mmol/L (ref 3.5–5.1)
Sodium: 140 mmol/L (ref 135–145)

## 2017-09-16 LAB — TROPONIN I: Troponin I: <0.03 ng/mL (ref <0.03)

## 2017-09-16 LAB — POCT ACTIVATED CLOTTING TIME: ACTIVATED CLOTTING TIME: 246 s

## 2017-09-16 SURGERY — LEFT HEART CATH AND CORONARY ANGIOGRAPHY
Anesthesia: LOCAL

## 2017-09-16 MED ORDER — NITROGLYCERIN 1 MG/10 ML FOR IR/CATH LAB
INTRA_ARTERIAL | Status: DC | PRN
  Administered 2017-09-16: 200 ug via INTRACORONARY

## 2017-09-16 MED ORDER — ADENOSINE 12 MG/4ML IV SOLN
INTRAVENOUS | Status: AC
  Filled 2017-09-16: qty 12

## 2017-09-16 MED ORDER — ROSUVASTATIN CALCIUM 20 MG PO TABS
40.0000 mg | ORAL_TABLET | Freq: Every day | ORAL | Status: DC
  Administered 2017-09-16: 40 mg via ORAL
  Filled 2017-09-16: qty 2

## 2017-09-16 MED ORDER — HEPARIN SODIUM (PORCINE) 1000 UNIT/ML IJ SOLN
INTRAMUSCULAR | Status: AC
  Filled 2017-09-16: qty 1

## 2017-09-16 MED ORDER — ISOSORBIDE MONONITRATE ER 60 MG PO TB24
60.0000 mg | ORAL_TABLET | Freq: Every day | ORAL | Status: DC
  Administered 2017-09-16 – 2017-09-17 (×2): 60 mg via ORAL
  Filled 2017-09-16 (×2): qty 1

## 2017-09-16 MED ORDER — FENTANYL CITRATE (PF) 100 MCG/2ML IJ SOLN
INTRAMUSCULAR | Status: DC | PRN
  Administered 2017-09-16: 50 ug via INTRAVENOUS

## 2017-09-16 MED ORDER — SODIUM CHLORIDE 0.9% FLUSH: 3.0000 mL | INTRAVENOUS | Status: DC | PRN

## 2017-09-16 MED ORDER — CLOPIDOGREL BISULFATE 75 MG PO TABS
75.0000 mg | ORAL_TABLET | Freq: Every day | ORAL | Status: DC
  Administered 2017-09-17: 75 mg via ORAL
  Filled 2017-09-16: qty 1

## 2017-09-16 MED ORDER — LISINOPRIL 2.5 MG PO TABS
2.5000 mg | ORAL_TABLET | Freq: Every day | ORAL | Status: DC
  Administered 2017-09-17: 2.5 mg via ORAL
  Filled 2017-09-16 (×2): qty 1

## 2017-09-16 MED ORDER — SODIUM CHLORIDE 0.9 % IV SOLN: INTRAVENOUS | Status: AC

## 2017-09-16 MED ORDER — LIDOCAINE HCL (PF) 1 % IJ SOLN
INTRAMUSCULAR | Status: AC
  Filled 2017-09-16: qty 30

## 2017-09-16 MED ORDER — SODIUM CHLORIDE 0.9% FLUSH
3.0000 mL | Freq: Two times a day (BID) | INTRAVENOUS | Status: DC
  Administered 2017-09-17: 3 mL via INTRAVENOUS

## 2017-09-16 MED ORDER — HEPARIN (PORCINE) IN NACL 1000-0.9 UT/500ML-% IV SOLN
INTRAVENOUS | Status: AC
  Filled 2017-09-16: qty 1000

## 2017-09-16 MED ORDER — SODIUM CHLORIDE 0.9 % IV SOLN: 250.0000 mL | INTRAVENOUS | Status: DC | PRN

## 2017-09-16 MED ORDER — FENTANYL CITRATE (PF) 100 MCG/2ML IJ SOLN
INTRAMUSCULAR | Status: AC
  Filled 2017-09-16: qty 2

## 2017-09-16 MED ORDER — IOPAMIDOL (ISOVUE-370) INJECTION 76%
INTRAVENOUS | Status: AC
  Filled 2017-09-16: qty 50

## 2017-09-16 MED ORDER — HEPARIN SODIUM (PORCINE) 1000 UNIT/ML IJ SOLN
INTRAMUSCULAR | Status: DC | PRN
  Administered 2017-09-16: 3000 [IU] via INTRAVENOUS
  Administered 2017-09-16: 2000 [IU] via INTRAVENOUS
  Administered 2017-09-16: 5000 [IU] via INTRAVENOUS

## 2017-09-16 MED ORDER — ASPIRIN 81 MG PO CHEW
81.0000 mg | CHEWABLE_TABLET | Freq: Every day | ORAL | Status: DC
  Administered 2017-09-17: 81 mg via ORAL
  Filled 2017-09-16: qty 1

## 2017-09-16 MED ORDER — HEPARIN SODIUM (PORCINE) 5000 UNIT/ML IJ SOLN
5000.0000 [IU] | Freq: Three times a day (TID) | INTRAMUSCULAR | Status: DC
  Administered 2017-09-16 – 2017-09-17 (×2): 5000 [IU] via SUBCUTANEOUS
  Filled 2017-09-16 (×2): qty 1

## 2017-09-16 MED ORDER — VERAPAMIL HCL 2.5 MG/ML IV SOLN
INTRAVENOUS | Status: AC
  Filled 2017-09-16: qty 2

## 2017-09-16 MED ORDER — HEPARIN (PORCINE) IN NACL 2-0.9 UNITS/ML
INTRAMUSCULAR | Status: AC | PRN
  Administered 2017-09-16 (×2): 500 mL via INTRA_ARTERIAL

## 2017-09-16 MED ORDER — HYDRALAZINE HCL 20 MG/ML IJ SOLN: 5.0000 mg | INTRAMUSCULAR | Status: AC | PRN

## 2017-09-16 MED ORDER — LABETALOL HCL 5 MG/ML IV SOLN: 10.0000 mg | INTRAVENOUS | Status: AC | PRN

## 2017-09-16 MED ORDER — ACETAMINOPHEN 325 MG PO TABS
650.0000 mg | ORAL_TABLET | ORAL | Status: DC | PRN
  Administered 2017-09-16: 650 mg via ORAL
  Filled 2017-09-16: qty 2

## 2017-09-16 MED ORDER — ADENOSINE (DIAGNOSTIC) 140MCG/KG/MIN
INTRAVENOUS | Status: DC | PRN
  Administered 2017-09-16: 140 ug/kg/min via INTRAVENOUS

## 2017-09-16 MED ORDER — METOPROLOL TARTRATE 25 MG PO TABS
25.0000 mg | ORAL_TABLET | Freq: Two times a day (BID) | ORAL | Status: DC
Start: 2017-09-16 — End: 2017-09-17
  Administered 2017-09-16 – 2017-09-17 (×3): 25 mg via ORAL
  Filled 2017-09-16 (×3): qty 1

## 2017-09-16 MED ORDER — IOPAMIDOL (ISOVUE-370) INJECTION 76%
INTRAVENOUS | Status: AC
  Filled 2017-09-16: qty 100

## 2017-09-16 MED ORDER — MIDAZOLAM HCL 2 MG/2ML IJ SOLN
INTRAMUSCULAR | Status: AC
  Filled 2017-09-16: qty 2

## 2017-09-16 MED ORDER — ADENOSINE 12 MG/4ML IV SOLN
INTRAVENOUS | Status: AC
  Filled 2017-09-16: qty 4

## 2017-09-16 MED ORDER — IOPAMIDOL (ISOVUE-370) INJECTION 76%
INTRAVENOUS | Status: DC | PRN
  Administered 2017-09-16: 125 mL via INTRA_ARTERIAL

## 2017-09-16 MED ORDER — ONDANSETRON HCL 4 MG/2ML IJ SOLN: 4.0000 mg | Freq: Four times a day (QID) | INTRAMUSCULAR | Status: DC | PRN

## 2017-09-16 MED ORDER — LIDOCAINE HCL (PF) 1 % IJ SOLN
INTRAMUSCULAR | Status: DC | PRN
  Administered 2017-09-16: 2 mL

## 2017-09-16 MED ORDER — MIDAZOLAM HCL 2 MG/2ML IJ SOLN
INTRAMUSCULAR | Status: DC | PRN
  Administered 2017-09-16: 1 mg via INTRAVENOUS

## 2017-09-16 MED ORDER — ALLOPURINOL 100 MG PO TABS
100.0000 mg | ORAL_TABLET | Freq: Every day | ORAL | Status: DC
  Administered 2017-09-16: 100 mg via ORAL
  Filled 2017-09-16: qty 1

## 2017-09-16 MED ORDER — VERAPAMIL HCL 2.5 MG/ML IV SOLN
INTRAVENOUS | Status: DC | PRN
  Administered 2017-09-16: 09:00:00 via INTRA_ARTERIAL

## 2017-09-16 SURGICAL SUPPLY — 15 items
CATH INFINITI JR4 5F (CATHETERS) ×2 IMPLANT
CATH MICROCATH NAVVUS (MICROCATHETER) ×1 IMPLANT
CATH VISTA GUIDE 6FR XBLAD3.5 (CATHETERS) ×2 IMPLANT
DEVICE RAD COMP TR BAND LRG (VASCULAR PRODUCTS) ×2 IMPLANT
GLIDESHEATH SLEND A-KIT 6F 22G (SHEATH) ×2 IMPLANT
GUIDEWIRE INQWIRE 1.5J.035X260 (WIRE) ×1 IMPLANT
INQWIRE 1.5J .035X260CM (WIRE) ×2
KIT HEART LEFT (KITS) ×2 IMPLANT
KIT HEMO VALVE WATCHDOG (MISCELLANEOUS) ×2 IMPLANT
MICROCATHETER NAVVUS (MICROCATHETER) ×2
PACK CARDIAC CATHETERIZATION (CUSTOM PROCEDURE TRAY) ×2 IMPLANT
TRANSDUCER W/STOPCOCK (MISCELLANEOUS) ×2 IMPLANT
TUBING CIL FLEX 10 FLL-RA (TUBING) ×2 IMPLANT
WIRE ASAHI PROWATER 180CM (WIRE) ×4 IMPLANT
WIRE COUGAR XT STRL 190CM (WIRE) ×2 IMPLANT

## 2017-09-16 NOTE — Interval H&P Note (Signed)
Cath Lab Visit (complete for each Cath Lab visit)  Clinical Evaluation Leading to the Procedure:   ACS: Yes.    Non-ACS:    Anginal Classification: CCS III  Anti-ischemic medical therapy: Minimal Therapy (1 class of medications)  Non-Invasive Test Results: No non-invasive testing performed  Prior CABG: No previous CABG      History and Physical Interval Note:  09/16/2017 8:43 AM  Robert Ramos  has presented today for surgery, with the diagnosis of unstable angina  The various methods of treatment have been discussed with the patient and family. After consideration of risks, benefits and other options for treatment, the patient has consented to  Procedure(s): LEFT HEART CATH AND CORONARY ANGIOGRAPHY (N/A) as a surgical intervention .  The patient's history has been reviewed, patient examined, no change in status, stable for surgery.  I have reviewed the patient's chart and labs.  Questions were answered to the patient's satisfaction.     Belva Crome III

## 2017-09-16 NOTE — Progress Notes (Signed)
1400-1420 Came to see pt to walk. Still on bedrest. Reviewed NTG use and discussed restarting walking instructions. Referral to [facility] made last admission. We educated pt at that time. Pt and wife voiced understanding. Encouraged pt to walk later with staff. Graylon Good RN BSN 09/16/2017 2:17 PM

## 2017-09-16 NOTE — Progress Notes (Signed)
Patient's BP=98/63.  Patient asymptomatic.  Reino Bellis NP notified.  Stated to continue to monitor.

## 2017-09-16 NOTE — Progress Notes (Signed)
Progress Note  Patient Name: Robert Ramos Date of Encounter: 09/16/2017  Primary Cardiologist: Agustin Cree  Subjective   Back from cath lab; had mild short episode of chest tightness and increased BP when returned to room; resolved  Inpatient Medications    Scheduled Meds: . aspirin  81 mg Oral Daily  . [START ON 09/17/2017] clopidogrel  75 mg Oral Q breakfast  . heparin  5,000 Units Subcutaneous Q8H  . isosorbide mononitrate  60 mg Oral Daily  . [START ON 09/17/2017] lisinopril  2.5 mg Oral Daily  . metoprolol tartrate  25 mg Oral BID  . rosuvastatin  40 mg Oral q1800  . sodium chloride flush  3 mL Intravenous Q12H   Continuous Infusions: . sodium chloride    . sodium chloride     PRN Meds: sodium chloride, acetaminophen, hydrALAZINE, labetalol, ondansetron (ZOFRAN) IV, sodium chloride flush   Vital Signs    Vitals:   09/16/17 1016 09/16/17 1048 09/16/17 1114 09/16/17 1200  BP: 130/86 (!) 143/83 128/70 (!) 115/96  Pulse: 79 78    Resp: 15 16    Temp:  97.9 F (36.6 C)    TempSrc:  Oral    SpO2: 100% 97% 99% 99%  Weight:      Height:        Intake/Output Summary (Last 24 hours) at 09/16/2017 1304 Last data filed at 09/16/2017 0615 Gross per 24 hour  Intake 1124.4 ml  Output -  Net 1124.4 ml    I/O since admission: +1124  [facility] Weights   09/15/17 1830 09/16/17 0606  Weight: 199 lb 3.2 oz (90.4 kg) 198 lb 1.6 oz (89.9 kg)    Telemetry    Sinus - Personally Reviewed  ECG    ECG (independently read by me): NSR at 79  Physical Exam   BP (!) 115/96   Pulse 78   Temp 97.9 F (36.6 C) (Oral)   Resp 16   Ht 5\' 5"  (1.651 m)   Wt 198 lb 1.6 oz (89.9 kg)   SpO2 99%   BMI 32.97 kg/m  General: Alert, oriented, no distress.  Skin: normal turgor, no rashes, warm and dry HEENT: Normocephalic, atraumatic. Pupils equal round and reactive to light; sclera anicteric; extraocular muscles intact;  Nose without nasal septal hypertrophy Mouth/Parynx benign;  Mallinpatti scale 3 Neck: No JVD, no carotid bruits; normal carotid upstroke Lungs: clear to ausculatation and percussion; no wheezing or rales Chest wall: without tenderness to palpitation Heart: PMI not displaced, RRR, s1 s2 normal, 1/6 systolic murmur, no diastolic murmur, no rubs, gallops, thrills, or heaves Abdomen: soft, nontender; no hepatosplenomehaly, BS+; abdominal aorta nontender and not dilated by palpation. Back: no CVA tenderness Pulses 2+: R radial site stable Musculoskeletal: full range of motion, normal strength, no joint deformities Extremities: no clubbing cyanosis or edema, Homan's sign negative  Neurologic: grossly nonfocal; Cranial nerves grossly wnl Psychologic: Normal mood and affect   Labs    Chemistry Recent Labs  Lab 09/10/17 0218 09/15/17 1255 09/15/17 1926 09/16/17 0708  NA 138 136  --  140  K 4.3 4.2  --  4.3  CL 104 102  --  105  CO2 27 26  --  26  GLUCOSE 124* 107*  --  116*  BUN 11 16  --  12  CREATININE 1.15 0.95 1.09 1.11  CALCIUM 9.0 9.1  --  9.0  GFRNONAA >60 >60 >60 >60  GFRAA >60 >60 >60 >60  ANIONGAP 7 8  --  9     Hematology Recent Labs  Lab 09/15/17 1255 09/15/17 1926 09/16/17 0708  WBC 7.1 8.5 6.2  RBC 4.12* 4.31 3.96*  HGB 12.5* 12.6* 11.8*  HCT 37.8* 39.6 37.2*  MCV 91.7 91.9 93.9  MCH 30.3 29.2 29.8  MCHC 33.1 31.8 31.7  RDW 13.4 13.2 13.3  PLT 202 215 193    Cardiac Enzymes Recent Labs  Lab 09/15/17 1255 09/15/17 1926 09/16/17 0131 09/16/17 0708  TROPONINI <0.03 <0.03 <0.03 <0.03   No results for input(s): TROPIPOC in the last 168 hours.   BNPNo results for input(s): BNP, PROBNP in the last 168 hours.   DDimer No results for input(s): DDIMER in the last 168 hours.   Lipid Panel     Component Value Date/Time   CHOL 129 09/09/2017 0352   CHOL 116 04/30/2017 0820   TRIG 373 (H) 09/09/2017 0352   HDL 30 (L) 09/09/2017 0352   HDL 33 (L) 04/30/2017 0820   CHOLHDL 4.3 09/09/2017 0352   VLDL 75 (H)  09/09/2017 0352   LDLCALC 24 09/09/2017 0352   LDLCALC 54 04/30/2017 0820    Radiology    Dg Chest 2 View  Result Date: 09/15/2017 CLINICAL DATA:  Chest pain for 5 days.  Status post stent placement. EXAM: CHEST - 2 VIEW COMPARISON:  09/07/2017 FINDINGS: The heart size and mediastinal contours are within normal limits. Both lungs are clear. The visualized skeletal structures are unremarkable. IMPRESSION: No active cardiopulmonary disease. Electronically Signed   By: Kerby Moors M.D.   On: 09/15/2017 13:11    Cardiac Studies    Cardiac Cath Conclusion    No change and coronary anatomy when compared to post PCI images on the right coronary from Sep 09, 2017.  Left main is widely patent.  LAD is moderate in size and contains a widely patent stent in the proximal to mid segment.  There is 40 to 60% narrowing and focally frame by frame region that appears to be more severely narrowed.  This region is unchanged from prior.  The first septal perforator contains eccentric ostial narrowing of 60% and is jailed by the LAD stent.  The segment beyond the LAD does not contain significant obstruction as was demonstrated by FFR which was 0.86.  The circumflex contains high-grade obstruction and a small first obtuse marginal that is focally present at the ostium of the OM.  The circumflex territory is unchanged when compared to prior imaging.  The RCA is dominant.  There is moderate diffuse disease that is 40 to 50% in the mid to distal RCA, and the previously placed stent from the distal RCA into the large left ventricular branch is widely patent and unchanged from prior.  Normal left ventricular hemodynamics with EDP of 8 mmHg.  RECOMMENDATIONS:   No change in angiographic appearance over the past 6 days since the PCI on the distal RCA was performed.  Recommend up titration of medical therapy and sublingual nitroglycerin as needed for chest discomfort.  Further management per treating team.      Patient Profile     67 y.o. male s/p distal RCA stent on 09/09/2017 presented with recurrent chest pain for repeat cath,  Assessment & Plan    1. CAD: s/p PCI/DES stent to PLA 09/09/17. Pt had experienced recurrent chest pain for 3 days leading to cath. Cath angio images personally reviewed; findings as above. Discussed with pt and family at length. Agree with medical management.  LAD FFR not significant.  Imdur was increased today to 60 mg. Will increase metoprolol to 37. 5 mg bid. On ASA/Plavix.  Troponins negative.   2. HLD: on rosuvastatin 40 mg. LDL excellent but mixed pattern with increased TG/VLDL.  Would initiate vascepa 2 capsules bid as outpatient based on Reduce-it trial data.   3. Essential HTN;  Goal < 130/80.  4. Obesity; needs weight los and exercise.  Will keep today with med titration as above.  Aim for DC tomorrow.  Signed, Troy Sine, MD, [facility] 09/16/2017, 1:04 PM

## 2017-09-17 ENCOUNTER — Ambulatory Visit: Payer: PPO | Admitting: Cardiology

## 2017-09-17 DIAGNOSIS — E782 Mixed hyperlipidemia: Secondary | ICD-10-CM

## 2017-09-17 DIAGNOSIS — R0602 Shortness of breath: Secondary | ICD-10-CM

## 2017-09-17 LAB — BASIC METABOLIC PANEL
Anion gap: 6 (ref 5–15)
BUN: 11 mg/dL (ref 6–20)
CHLORIDE: 107 mmol/L (ref 101–111)
CO2: 25 mmol/L (ref 22–32)
Calcium: 8.9 mg/dL (ref 8.9–10.3)
Creatinine, Ser: 1.06 mg/dL (ref 0.61–1.24)
GFR calc Af Amer: >60 mL/min (ref >60)
GFR calc non Af Amer: >60 mL/min (ref >60)
GLUCOSE: 139 mg/dL — AB (ref 65–99)
POTASSIUM: 4.2 mmol/L (ref 3.5–5.1)
Sodium: 138 mmol/L (ref 135–145)

## 2017-09-17 LAB — CBC
HEMATOCRIT: 35.9 % — AB (ref 39.0–52.0)
Hemoglobin: 11.6 g/dL — ABNORMAL LOW (ref 13.0–17.0)
MCH: 29.9 pg (ref 26.0–34.0)
MCHC: 32.3 g/dL (ref 30.0–36.0)
MCV: 92.5 fL (ref 78.0–100.0)
Platelets: 205 10*3/uL (ref 150–400)
RBC: 3.88 MIL/uL — ABNORMAL LOW (ref 4.22–5.81)
RDW: 13.3 % (ref 11.5–15.5)
WBC: 6.5 10*3/uL (ref 4.0–10.5)

## 2017-09-17 MED ORDER — ICOSAPENT ETHYL 1 G PO CAPS: 2.0000 | ORAL_CAPSULE | Freq: Two times a day (BID) | ORAL | 3 refills | Status: DC

## 2017-09-17 MED ORDER — METOPROLOL TARTRATE 50 MG PO TABS: 50.0000 mg | ORAL_TABLET | Freq: Two times a day (BID) | ORAL | 6 refills | Status: DC

## 2017-09-17 MED ORDER — COLCHICINE 0.6 MG PO TABS: ORAL_TABLET | ORAL | 0 refills | Status: DC

## 2017-09-17 MED ORDER — ISOSORBIDE MONONITRATE ER 60 MG PO TB24: 60.0000 mg | ORAL_TABLET | Freq: Every day | ORAL | 6 refills | Status: DC

## 2017-09-17 NOTE — Discharge Summary (Addendum)
Discharge Summary    Patient ID: Robert Ramos,  MRN: [id number], DOB/AGE: 66-Apr-1952 66 y.o.  Admit date: 09/15/2017 Discharge date: 09/17/2017  Primary Care Provider: Gilford Rile A. Primary Ramos: Dr. Agustin Ramos  Discharge Diagnoses    Principal Problem:   Unstable angina [facility]) Active Problems:   Coronary artery disease   Shortness of breath   Other forms of angina pectoris ([place])   Mixed hyperlipidemia   Acute gout flare  Allergies Allergies  Allergen Reactions  . Penicillin G Anaphylaxis, Swelling and Rash    Has patient had a PCN reaction causing immediate rash, facial/tongue/throat swelling, SOB or lightheadedness with hypotension:Yes Has patient had a PCN reaction causing severe rash involving mucus membranes or skin necrosis:Yes--all over body Has patient had a PCN reaction that required hospitalization:No--treated in ER Has patient had a PCN reaction occurring within the last 10 years:No If all of the above answers are "NO", then may proceed with Cephalosporin use.     Diagnostic Studies/Procedures    Cardiac Cath Conclusion 09/16/2017   No change and coronary anatomy when compared to post PCI images on the right coronary from Sep 09, 2017.  Left main is widely patent.  LAD is moderate in size and contains a widely patent stent in the proximal to mid segment. There is 40 to 60% narrowing and focally frame by frame region that appears to be more severely narrowed. This region is unchanged from prior. The first septal perforator contains eccentric ostial narrowing of 60% and is jailed by the LAD stent. The segment beyond the LAD does not contain significant obstruction as was demonstrated by FFR which was 0.86.  The circumflex contains high-grade obstruction and a small first obtuse marginal that is focally present at the ostium of the OM. The circumflex territory is unchanged when compared to prior imaging.  The RCA is dominant. There is  moderate diffuse disease that is 40 to 50% in the mid to distal RCA, and the previously placed stent from the distal RCA into the large left ventricular branch is widely patent and unchanged from prior.  Normal left ventricular hemodynamics with EDP of 8 mmHg.  RECOMMENDATIONS:   No change in angiographic appearance over the past 6 days since the PCI on the distal RCA was performed.  Recommend up titration of medical therapy and sublingual nitroglycerin as needed for chest discomfort.  Further management per treating team.   Diagnostic Diagram        History of Present Illness     67 y.o. male history of CAD with complex PCI in 11/2016, MI in 2002, HLD, HTN, gout  and recent s/p distal RCA stent on 09/09/2017 presented with recurrent chest pain x days admitted directly from clinic repeat cath.   Admitted 5/29-5/31 for [place]. Patent mLAD stent. 40% in-stent restenosis of mRCA, unchanged from prior cath. The posterolateral artery is a moderate caliber vessel with 99% stenosis s/p PTCA and DES. Discharged on DAPT and imdur for pain with ambulation.   He continued to have intermittent chest pain during office evaluation and sent for cath.   Hospital Course     Consultants: None   Cath showed no change in angiographic appearance over the past 6 days since the PCI on the distal RCA was performed. LAD FFR not significant. Increased Imdur to 60mg  and metoprolol to 50mg  BID. No recurrent chest pain. He also as gout flare prior to admission. Resume home allopurinol with minimal improvement. Will switch to colchicine at  discharge. He has follow up with PCP early next week. LDL excellent but mixed pattern with increased TG/VLDL.  Would initiate vascepa 2 capsules bid based on Reduce-it trial data. Encouraged weight loss.   The patient has been seen by Dr. Claiborne Ramos  today and deemed ready for discharge home. All follow-up appointments have been scheduled. Discharge medications are listed below.    Discharge Vitals Blood pressure 121/75, pulse 87, temperature 98.1 F (36.7 C), resp. rate 18, height 5\' 5"  (1.651 m), weight 198 lb 1.6 oz (89.9 kg), SpO2 98 %.  Filed Weights   09/15/17 1830 09/16/17 0606 09/17/17 0528  Weight: 199 lb 3.2 oz (90.4 kg) 198 lb 1.6 oz (89.9 kg) 198 lb 1.6 oz (89.9 kg)    Labs & Radiologic Studies     CBC Recent Labs    09/15/17 1255  09/16/17 0708 09/17/17 0604  WBC 7.1   < > 6.2 6.5  NEUTROABS 4.9  --   --   --   HGB 12.5*   < > 11.8* 11.6*  HCT 37.8*   < > 37.2* 35.9*  MCV 91.7   < > 93.9 92.5  PLT 202   < > 193 205   < > = values in this interval not displayed.   Basic Metabolic Panel Recent Labs    09/16/17 0708 09/17/17 0604  NA 140 138  K 4.3 4.2  CL 105 107  CO2 26 25  GLUCOSE 116* 139*  BUN 12 11  CREATININE 1.11 1.06  CALCIUM 9.0 8.9   Cardiac Enzymes Recent Labs    09/15/17 1926 09/16/17 0131 09/16/17 0708  TROPONINI <0.03 <0.03 <0.03    Dg Chest 2 View  Result Date: 09/15/2017 CLINICAL DATA:  Chest pain for 5 days.  Status post stent placement. EXAM: CHEST - 2 VIEW COMPARISON:  09/07/2017 FINDINGS: The heart size and mediastinal contours are within normal limits. Both lungs are clear. The visualized skeletal structures are unremarkable. IMPRESSION: No active cardiopulmonary disease. Electronically Signed   By: Robert Ramos M.D.   On: 09/15/2017 13:11   Dg Chest 2 View  Result Date: 09/07/2017 CLINICAL DATA:  Chest pain, shortness of breath x1 day EXAM: CHEST - 2 VIEW COMPARISON:  12/31/2006 FINDINGS: Lungs are clear.  No pleural effusion or pneumothorax. The heart is normal in size. Mild degenerative changes of the visualized thoracolumbar spine. IMPRESSION: Normal chest radiographs. Electronically Signed   By: Julian Hy M.D.   On: 09/07/2017 21:57    Disposition   Pt is being discharged home today in good condition.  Follow-up Plans & Appointments    Follow-up Information    [facility]. Go on 09/29/2017.   Specialty:  Cardiology Why:  @11 :20am with Dr. Joycelyn Ramos for hospital follow up  Contact information: [address] [place]       Robert Ramos., MD. Schedule an appointment as soon as possible for a visit in 4 day(s).   Specialty:  Internal Medicine Why:  for gout  Contact information: [place] [id number] [phone]          Discharge Instructions    Diet - low sodium heart healthy   Complete by:  As directed    Discharge instructions   Complete by:  As directed    No driving for 48 hours. No lifting over 5 lbs for 1 week. No sexual activity for 1 week.  Keep procedure  site clean & dry. If you notice increased pain, swelling, bleeding or pus, call/return!  You may shower, but no soaking baths/hot tubs/pools for 1 week.   Increase activity slowly   Complete by:  As directed       Discharge Medications   Allergies as of 09/17/2017      Reactions   Penicillin G Anaphylaxis, Swelling, Rash   Has patient had a PCN reaction causing immediate rash, facial/tongue/throat swelling, SOB or lightheadedness with hypotension:Yes Has patient had a PCN reaction causing severe rash involving mucus membranes or skin necrosis:Yes--all over body Has patient had a PCN reaction that required hospitalization:No--treated in ER Has patient had a PCN reaction occurring within the last 10 years:No If all of the above answers are "NO", then may proceed with Cephalosporin use.      Medication List    STOP taking these medications   acetaminophen 500 MG tablet Commonly known as:  TYLENOL   allopurinol 100 MG tablet Commonly known as:  ZYLOPRIM     TAKE these medications   aspirin EC 81 MG tablet Take 1 tablet (81 mg total) by mouth daily.   clopidogrel 75 MG tablet Commonly known as:  PLAVIX Take 1 tablet (75 mg total) by mouth daily with breakfast.   colchicine 0.6 MG tablet Take  1 table three times today and then twice a day from tomorrow until seen by PCP next week   Icosapent Ethyl 1 g Caps Commonly known as:  VASCEPA Take 2 capsules (2 g total) by mouth 2 times daily at 12 noon and 4 pm.   isosorbide mononitrate 60 MG 24 hr tablet Commonly known as:  IMDUR Take 1 tablet (60 mg total) by mouth daily. What changed:    medication strength  how much to take   lisinopril 2.5 MG tablet Commonly known as:  PRINIVIL,ZESTRIL Take 1 tablet (2.5 mg total) by mouth daily.   metoprolol tartrate 50 MG tablet Commonly known as:  LOPRESSOR Take 1 tablet (50 mg total) by mouth 2 (two) times daily. What changed:    medication strength  how much to take   nitroGLYCERIN 0.4 MG SL tablet Commonly known as:  NITROSTAT Place 1 tablet (0.4 mg total) under the tongue every 5 (five) minutes as needed for chest pain.   rosuvastatin 40 MG tablet Commonly known as:  CRESTOR Take 1 tablet (40 mg total) by mouth daily. What changed:  when to take this   Vitamin D (Ergocalciferol) 50000 units Caps capsule Commonly known as:  DRISDOL Take 50,000 Units by mouth once a week.         Outstanding Labs/Studies   Lipid panel and LFT in 6 weeks/   Duration of Discharge Encounter   Greater than 30 minutes including physician time.  Signed, Crista Luria Bhagat PA-C 09/17/2017, 10:23 AM    Patient seen and examined. Agree with assessment and plan. No recurrent chest pain. Tolerating increased imdur.  HR still in upper 80s will further titrate metoprolol to 50 mg bid.  Add Vascepa 2 capsules bid to rosuvastatin 40 mg.  Again reviewed cath findings.  DC today, f/u Dr. Agustin Ramos.   Troy Sine, MD, [facility] 09/17/2017 12:22 PM

## 2017-09-17 NOTE — Discharge Instructions (Signed)
Heart-Healthy Eating Plan °Many factors influence your heart health, including eating and exercise habits. Heart (coronary) risk increases with abnormal blood fat (lipid) levels. Heart-healthy meal planning includes limiting unhealthy fats, increasing healthy fats, and making other small dietary changes. This includes maintaining a healthy body weight to help keep lipid levels within a normal range. °What is my plan? °Your health care provider recommends that you: °· Get no more than _________% of the total calories in your daily diet from fat. °· Limit your intake of saturated fat to less than _________% of your total calories each day. °· Limit the amount of cholesterol in your diet to less than _________ mg per day. ° °What types of fat should I choose? °· Choose healthy fats more often. Choose monounsaturated and polyunsaturated fats, such as olive oil and canola oil, flaxseeds, walnuts, almonds, and seeds. °· Eat more omega-3 fats. Good choices include salmon, mackerel, sardines, tuna, flaxseed oil, and ground flaxseeds. Aim to eat fish at least two times each week. °· Limit saturated fats. Saturated fats are primarily found in animal products, such as meats, butter, and cream. Plant sources of saturated fats include palm oil, palm kernel oil, and coconut oil. °· Avoid foods with partially hydrogenated oils in them. These contain trans fats. Examples of foods that contain trans fats are stick margarine, some tub margarines, cookies, crackers, and other baked goods. °What general guidelines do I need to follow? °· Check food labels carefully to identify foods with trans fats or high amounts of saturated fat. °· Fill one half of your plate with vegetables and green salads. Eat 4-5 servings of vegetables per day. A serving of vegetables equals 1 cup of raw leafy vegetables, ½ cup of raw or cooked cut-up vegetables, or ½ cup of vegetable juice. °· Fill one fourth of your plate with whole grains. Look for the word  "whole" as the first word in the ingredient list. °· Fill one fourth of your plate with lean protein foods. °· Eat 4-5 servings of fruit per day. A serving of fruit equals one medium whole fruit, ¼ cup of dried fruit, ½ cup of fresh, frozen, or canned fruit, or ½ cup of 100% fruit juice. °· Eat more foods that contain soluble fiber. Examples of foods that contain this type of fiber are apples, broccoli, carrots, beans, peas, and barley. Aim to get 20-30 g of fiber per day. °· Eat more home-cooked food and less restaurant, buffet, and fast food. °· Limit or avoid alcohol. °· Limit foods that are high in starch and sugar. °· Avoid fried foods. °· Cook foods by using methods other than frying. Baking, boiling, grilling, and broiling are all great options. Other fat-reducing suggestions include: °? Removing the skin from poultry. °? Removing all visible fats from meats. °? Skimming the fat off of stews, soups, and gravies before serving them. °? Steaming vegetables in water or broth. °· Lose weight if you are overweight. Losing just 5-10% of your initial body weight can help your overall health and prevent diseases such as diabetes and heart disease. °· Increase your consumption of nuts, legumes, and seeds to 4-5 servings per week. One serving of dried beans or legumes equals ½ cup after being cooked, one serving of nuts equals 1½ ounces, and one serving of seeds equals ½ ounce or 1 tablespoon. °· You may need to monitor your salt (sodium) intake, especially if you have high blood pressure. Talk with your health care provider or dietitian to get   more information about reducing sodium. °What foods can I eat? °Grains ° °Breads, including French, white, pita, wheat, raisin, rye, oatmeal, and Italian. Tortillas that are neither fried nor made with lard or trans fat. Low-fat rolls, including hotdog and hamburger buns and English muffins. Biscuits. Muffins. Waffles. Pancakes. Light popcorn. Whole-grain cereals. Flatbread.  Melba toast. Pretzels. Breadsticks. Rusks. Low-fat snacks and crackers, including oyster, saltine, matzo, graham, animal, and rye. Rice and pasta, including brown rice and those that are made with whole wheat. °Vegetables °All vegetables. °Fruits °All fruits, but limit coconut. °Meats and Other Protein Sources °Lean, well-trimmed beef, veal, pork, and lamb. Chicken and turkey without skin. All fish and shellfish. Wild duck, rabbit, pheasant, and venison. Egg whites or low-cholesterol egg substitutes. Dried beans, peas, lentils, and tofu. Seeds and most nuts. °Dairy °Low-fat or nonfat cheeses, including ricotta, string, and mozzarella. Skim or 1% milk that is liquid, powdered, or evaporated. Buttermilk that is made with low-fat milk. Nonfat or low-fat yogurt. °Beverages °Mineral water. Diet carbonated beverages. °Sweets and Desserts °Sherbets and fruit ices. Honey, jam, marmalade, jelly, and syrups. Meringues and gelatins. Pure sugar candy, such as hard candy, jelly beans, gumdrops, mints, marshmallows, and small amounts of dark chocolate. Angel food cake. °Eat all sweets and desserts in moderation. °Fats and Oils °Nonhydrogenated (trans-free) margarines. Vegetable oils, including soybean, sesame, sunflower, olive, peanut, safflower, corn, canola, and cottonseed. Salad dressings or mayonnaise that are made with a vegetable oil. Limit added fats and oils that you use for cooking, baking, salads, and as spreads. °Other °Cocoa powder. Coffee and tea. All seasonings and condiments. °The items listed above may not be a complete list of recommended foods or beverages. Contact your dietitian for more options. °What foods are not recommended? °Grains °Breads that are made with saturated or trans fats, oils, or whole milk. Croissants. Butter rolls. Cheese breads. Sweet rolls. Donuts. Buttered popcorn. Chow mein noodles. High-fat crackers, such as cheese or butter crackers. °Meats and Other Protein Sources °Fatty meats, such  as hotdogs, short ribs, sausage, spareribs, bacon, ribeye roast or steak, and mutton. High-fat deli meats, such as salami and bologna. Caviar. Domestic duck and goose. Organ meats, such as kidney, liver, sweetbreads, brains, gizzard, chitterlings, and heart. °Dairy °Cream, sour cream, cream cheese, and creamed cottage cheese. Whole milk cheeses, including blue (bleu), Monterey Jack, Brie, [place], American, [place], Swiss, cheddar, [place], and [place]. Whole or 2% milk that is liquid, evaporated, or condensed. Whole buttermilk. Cream sauce or high-fat cheese sauce. Yogurt that is made from whole milk. °Beverages °Regular sodas and drinks with added sugar. °Sweets and Desserts °Frosting. Pudding. Cookies. Cakes other than angel food cake. Candy that has milk chocolate or white chocolate, hydrogenated fat, butter, coconut, or unknown ingredients. Buttered syrups. Full-fat ice cream or ice cream drinks. °Fats and Oils °Gravy that has suet, meat fat, or shortening. Cocoa butter, hydrogenated oils, palm oil, coconut oil, palm kernel oil. These can often be found in baked products, candy, fried foods, nondairy creamers, and whipped toppings. Solid fats and shortenings, including bacon fat, salt pork, lard, and butter. Nondairy cream substitutes, such as coffee creamers and sour cream substitutes. Salad dressings that are made of unknown oils, cheese, or sour cream. °The items listed above may not be a complete list of foods and beverages to avoid. Contact your dietitian for more information. °This information is not intended to replace advice given to you by your health care provider. Make sure you discuss any questions you have with your health care   provider. Document Released: 01/07/2008 Document Revised: 10/18/2015 Document Reviewed: 09/21/2013 Elsevier Interactive Patient Education  2018 [place] Heart-healthy meal planning includes:  Limiting unhealthy fats.  Increasing  healthy fats.  Making other small dietary changes.  You may need to talk with your doctor or a diet specialist (dietitian) to create an eating plan that is right for you. What types of fat should I choose?  Choose healthy fats. These include olive oil and canola oil, flaxseeds, walnuts, almonds, and seeds.  Eat more omega-3 fats. These include salmon, mackerel, sardines, tuna, flaxseed oil, and ground flaxseeds. Try to eat fish at least twice each week.  Limit saturated fats. ? Saturated fats are often found in animal products, such as meats, butter, and cream. ? Plant sources of saturated fats include palm oil, palm kernel oil, and coconut oil.  Avoid foods with partially hydrogenated oils in them. These include stick margarine, some tub margarines, cookies, crackers, and other baked goods. These contain trans fats. What general guidelines do I need to follow?  Check food labels carefully. Identify foods with trans fats or high amounts of saturated fat.  Fill one half of your plate with vegetables and green salads. Eat 4-5 servings of vegetables per day. A serving of vegetables is: ? 1 cup of raw leafy vegetables. ?  cup of raw or cooked cut-up vegetables. ?  cup of vegetable juice.  Fill one fourth of your plate with whole grains. Look for the word "whole" as the first word in the ingredient list.  Fill one fourth of your plate with lean protein foods.  Eat 4-5 servings of fruit per day. A serving of fruit is: ? One medium whole fruit. ?  cup of dried fruit. ?  cup of fresh, frozen, or canned fruit. ?  cup of 100% fruit juice.  Eat more foods that contain soluble fiber. These include apples, broccoli, carrots, beans, peas, and barley. Try to get 20-30 g of fiber per day.  Eat more home-cooked food. Eat less restaurant, buffet, and fast food.  Limit or avoid alcohol.  Limit foods high in starch and sugar.  Avoid fried foods.  Avoid frying your food. Try baking,  boiling, grilling, or broiling it instead. You can also reduce fat by: ? Removing the skin from poultry. ? Removing all visible fats from meats. ? Skimming the fat off of stews, soups, and gravies before serving them. ? Steaming vegetables in water or broth.  Lose weight if you are overweight.  Eat 4-5 servings of nuts, legumes, and seeds per week: ? One serving of dried beans or legumes equals  cup after being cooked. ? One serving of nuts equals 1 ounces. ? One serving of seeds equals  ounce or one tablespoon.  You may need to keep track of how much salt or sodium you eat. This is especially true if you have high blood pressure. Talk with your doctor or dietitian to get more information. What foods can I eat? Grains Breads, including [place], white, pita, wheat, raisin, rye, oatmeal, and [place]. Tortillas that are neither fried nor made with lard or trans fat. Low-fat rolls, including hotdog and hamburger buns and English muffins. Biscuits. Muffins. Waffles. Pancakes. Light popcorn. Whole-grain cereals. Flatbread. Melba toast. Pretzels. Breadsticks. Rusks. Low-fat snacks. Low-fat crackers, including oyster, saltine, matzo, graham, animal, and rye. Rice and pasta, including brown rice and pastas that are made with whole wheat. Vegetables All vegetables. Fruits All fruits, but limit coconut. Meats and  Other Protein Sources Lean, well-trimmed beef, veal, pork, and lamb. Chicken and [place] without skin. All fish and shellfish. Wild duck, rabbit, pheasant, and venison. Egg whites or low-cholesterol egg substitutes. Dried beans, peas, lentils, and tofu. Seeds and most nuts. Dairy Low-fat or nonfat cheeses, including ricotta, string, and mozzarella. Skim or 1% milk that is liquid, powdered, or evaporated. Buttermilk that is made with low-fat milk. Nonfat or low-fat yogurt. Beverages Mineral water. Diet carbonated beverages. Sweets and Desserts Sherbets and fruit ices. Honey, jam, marmalade,  jelly, and syrups. Meringues and gelatins. Pure sugar candy, such as hard candy, jelly beans, gumdrops, mints, marshmallows, and small amounts of dark chocolate. [employer] Eat all sweets and desserts in moderation. Fats and Oils Nonhydrogenated (trans-free) margarines. Vegetable oils, including soybean, sesame, sunflower, olive, peanut, safflower, corn, canola, and cottonseed. Salad dressings or mayonnaise made with a vegetable oil. Limit added fats and oils that you use for cooking, baking, salads, and as spreads. Other Cocoa powder. Coffee and tea. All seasonings and condiments. The items listed above may not be a complete list of recommended foods or beverages. Contact your dietitian for more options. What foods are not recommended? Grains Breads that are made with saturated or trans fats, oils, or whole milk. Croissants. Butter rolls. Cheese breads. Sweet rolls. Donuts. Buttered popcorn. Chow mein noodles. High-fat crackers, such as cheese or butter crackers. Meats and Other Protein Sources Fatty meats, such as hotdogs, short ribs, sausage, spareribs, bacon, rib eye roast or steak, and mutton. High-fat deli meats, such as salami and bologna. Caviar. Domestic duck and goose. Organ meats, such as kidney, liver, sweetbreads, and heart. Dairy Cream, sour cream, cream cheese, and creamed cottage cheese. Whole-milk cheeses, including blue (bleu), Monterey Jack, [place], [place], American, [place], Swiss, cheddar, [place], and [place]. Whole or 2% milk that is liquid, evaporated, or condensed. Whole buttermilk. Cream sauce or high-fat cheese sauce. Yogurt that is made from whole milk. Beverages Regular sodas and juice drinks with added sugar. Sweets and Desserts Frosting. Pudding. Cookies. Cakes other than angel food cake. Candy that has milk chocolate or white chocolate, hydrogenated fat, butter, coconut, or unknown ingredients. Buttered syrups. Full-fat ice cream or ice cream drinks. Fats and  Oils Gravy that has suet, meat fat, or shortening. Cocoa butter, hydrogenated oils, palm oil, coconut oil, palm kernel oil. These can often be found in baked products, candy, fried foods, nondairy creamers, and whipped toppings. Solid fats and shortenings, including bacon fat, salt pork, lard, and butter. Nondairy cream substitutes, such as coffee creamers and sour cream substitutes. Salad dressings that are made of unknown oils, cheese, or sour cream. The items listed above may not be a complete list of foods and beverages to avoid. Contact your dietitian for more information. This information is not intended to replace advice given to you by your health care provider. Make sure you discuss any questions you have with your health care provider. Document Released: 09/29/2011 Document Revised: 09/05/2015 Document Reviewed: 09/21/2013 Elsevier Interactive Patient Education  [employer].

## 2017-09-20 MED FILL — Heparin Sod (Porcine)-NaCl IV Soln 1000 Unit/500ML-0.9%: INTRAVENOUS | Qty: 1000 | Status: AC

## 2017-09-23 DIAGNOSIS — I129 Hypertensive chronic kidney disease with stage 1 through stage 4 chronic kidney disease, or unspecified chronic kidney disease: Secondary | ICD-10-CM | POA: Diagnosis not present

## 2017-09-23 DIAGNOSIS — I2510 Atherosclerotic heart disease of native coronary artery without angina pectoris: Secondary | ICD-10-CM | POA: Diagnosis not present

## 2017-09-23 DIAGNOSIS — G4733 Obstructive sleep apnea (adult) (pediatric): Secondary | ICD-10-CM | POA: Diagnosis not present

## 2017-09-23 DIAGNOSIS — N183 Chronic kidney disease, stage 3 (moderate): Secondary | ICD-10-CM | POA: Diagnosis not present

## 2017-09-29 ENCOUNTER — Ambulatory Visit (INDEPENDENT_AMBULATORY_CARE_PROVIDER_SITE_OTHER): Payer: PPO | Admitting: Cardiology

## 2017-09-29 ENCOUNTER — Encounter: Payer: Self-pay | Admitting: Cardiology

## 2017-09-29 VITALS — BP 110/72 | HR 83 | Ht 65.0 in | Wt 198.4 lb

## 2017-09-29 DIAGNOSIS — I25118 Atherosclerotic heart disease of native coronary artery with other forms of angina pectoris: Secondary | ICD-10-CM | POA: Diagnosis not present

## 2017-09-29 DIAGNOSIS — E785 Hyperlipidemia, unspecified: Secondary | ICD-10-CM

## 2017-09-29 DIAGNOSIS — I209 Angina pectoris, unspecified: Secondary | ICD-10-CM | POA: Diagnosis not present

## 2017-09-29 MED ORDER — RANOLAZINE ER 500 MG PO TB12: 500.0000 mg | ORAL_TABLET | Freq: Two times a day (BID) | ORAL | 6 refills | Status: DC

## 2017-09-29 NOTE — Progress Notes (Signed)
Cardiology Office Note:    Date:  09/29/2017   ID:  Robert Ramos, DOB December 13, 1950, MRN [id number]  PCP:  [name], MD  Cardiologist:  Jenne Campus, MD    Referring MD: [name], MD   Chief Complaint  Patient presents with  . Follow-up  Doing better but still have some episode of chest pain  History of Present Illness:    Robert Ramos is a 67 y.o. male with coronary artery disease I seen him last a week ago when he came to me after cardiac catheterization when stent to mid LAD was implanted.  Last time when he was here he complained of having typical exertional tightness which was worse since that time he has been discharged from hospital.  He was sent back to the cardiac cath laboratory his cardiac catheterization showed patent stent site.  He did have some luminal irregularities in multiple arteries with some significant lesions but very small arteries there were not amendable for intervention.  Therefore the goal right now will be to maximize his medical therapy.  He complained of having rare episode of pain in the matter-of-fact he did have some pain few days ago at evening time and he decided to split Imdur to 30 in the morning and 30 in afternoon.  I told him this is an appropriate I asked him to start taking 60 mg Imdur every day.  I will initiate therapy with ranolazine 500 mg twice daily.  I instructed him to keep taking nitroglycerin if he needs to.  He will get refill on his nitroglycerin.  Past Medical History:  Diagnosis Date  . Arthritis    "hands, back" (11/20/2016)  . Coronary artery disease    a. MI in 2002 s/p stenting to mRCA b.cath 11/2016 s/p DES to mLAD c. Cath 08/2017- patent LAD stent, 40% instent restenosis of mRCA, 99% posterolateral artery s/p PTCA & DES.  [name] Heart attack ([place]) 2002  . History of gout   . History of kidney stones   . Hyperlipidemia   . Hypertension     Past Surgical History:  Procedure Laterality Date  .  APPENDECTOMY  1974  . BACK SURGERY    . CORONARY ANGIOPLASTY WITH STENT PLACEMENT  2002; 11/20/2016   "@ [place]; @ [facility]"  . CORONARY STENT INTERVENTION N/A 11/20/2016   Procedure: CORONARY STENT INTERVENTION;  Surgeon: Nelva Bush, MD;  Location: [place] [facility];  Service: Cardiovascular;  Laterality: N/A;  . CORONARY STENT INTERVENTION N/A 09/09/2017   Procedure: CORONARY STENT INTERVENTION;  Surgeon: Burnell Blanks, MD;  Location: [place] [facility];  Service: Cardiovascular;  Laterality: N/A;  . HEMI-MICRODISCECTOMY LUMBAR LAMINECTOMY LEVEL 1 Left 2008   L4  . INTRAVASCULAR PRESSURE WIRE/FFR STUDY N/A 09/16/2017   Procedure: INTRAVASCULAR PRESSURE WIRE/FFR STUDY;  Surgeon: Belva Crome, MD;  Location: [place] [facility];  Service: Cardiovascular;  Laterality: N/A;  . INTRAVASCULAR ULTRASOUND/IVUS N/A 11/20/2016   Procedure: Intravascular Ultrasound/IVUS;  Surgeon: Nelva Bush, MD;  Location: [place] [facility];  Service: Cardiovascular;  Laterality: N/A;  . LEFT HEART CATH AND CORONARY ANGIOGRAPHY N/A 11/20/2016   Procedure: LEFT HEART CATH AND CORONARY ANGIOGRAPHY;  Surgeon: Nelva Bush, MD;  Location: [place] [facility];  Service: Cardiovascular;  Laterality: N/A;  . LEFT HEART CATH AND CORONARY ANGIOGRAPHY N/A 09/09/2017   Procedure: LEFT HEART CATH AND CORONARY ANGIOGRAPHY;  Surgeon: Burnell Blanks, MD;  Location: [place] [facility];  Service:  Cardiovascular;  Laterality: N/A;  . LEFT HEART CATH AND CORONARY ANGIOGRAPHY N/A 09/16/2017   Procedure: LEFT HEART CATH AND CORONARY ANGIOGRAPHY;  Surgeon: Belva Crome, MD;  Location: [place] [facility];  Service: Cardiovascular;  Laterality: N/A;  . TONSILLECTOMY      Current Medications: Current Meds  Medication Sig  . aspirin EC 81 MG tablet Take 1 tablet (81 mg total) by mouth daily.  . clopidogrel (PLAVIX) 75 MG tablet Take 1 tablet (75 mg total) by mouth daily with breakfast.  . colchicine 0.6  MG tablet Take 1 table three times today and then twice a day from tomorrow until seen by PCP next week  . Icosapent Ethyl (VASCEPA) 1 g CAPS Take 2 capsules (2 g total) by mouth 2 times daily at 12 noon and 4 pm.  . isosorbide mononitrate (IMDUR) 60 MG 24 hr tablet Take 1 tablet (60 mg total) by mouth daily.  [name] lisinopril (PRINIVIL,ZESTRIL) 2.5 MG tablet Take 1 tablet (2.5 mg total) by mouth daily.  . metoprolol tartrate (LOPRESSOR) 50 MG tablet Take 1 tablet (50 mg total) by mouth 2 (two) times daily.  . nitroGLYCERIN (NITROSTAT) 0.4 MG SL tablet Place 1 tablet (0.4 mg total) under the tongue every 5 (five) minutes as needed for chest pain.  . rosuvastatin (CRESTOR) 40 MG tablet Take 1 tablet (40 mg total) by mouth daily. (Patient taking differently: Take 40 mg by mouth at bedtime. )  . Vitamin D, Ergocalciferol, (DRISDOL) 50000 units CAPS capsule Take 50,000 Units by mouth once a week.     Allergies:   Penicillin g   Social History   Socioeconomic History  . Marital status: Married    Spouse name: Not on file  . Number of children: Not on file  . Years of education: Not on file  . Highest education level: Not on file  Occupational History  . Not on file  Social Needs  . Financial resource strain: Not on file  . Food insecurity:    Worry: Not on file    Inability: Not on file  . Transportation needs:    Medical: Not on file    Non-medical: Not on file  Tobacco Use  . Smoking status: Former Smoker    Packs/day: 1.00    Years: 35.00    Pack years: 35.00    Types: Cigarettes    Last attempt to quit: 02/06/2001    Years since quitting: 16.6  . Smokeless tobacco: Never Used  Substance and Sexual Activity  . Alcohol use: Yes    Alcohol/week: 9.0 oz    Types: 15 Cans of beer per week  . Drug use: No  . Sexual activity: Not on file  Lifestyle  . Physical activity:    Days per week: Not on file    Minutes per session: Not on file  . Stress: Not on file  Relationships  .  Social connections:    Talks on phone: Not on file    Gets together: Not on file    Attends religious service: Not on file    Active member of club or organization: Not on file    Attends meetings of clubs or organizations: Not on file    Relationship status: Not on file  Other Topics Concern  . Not on file  Social History Narrative  . Not on file     Family History: The patient's family history includes Cancer in his father. ROS:   Please see the history  of present illness.    All 14 point review of systems negative except as described per history of present illness  EKGs/Labs/Other Studies Reviewed:      Recent Labs: 04/30/2017: ALT 21 09/08/2017: Magnesium 2.0; TSH 1.305 09/17/2017: BUN 11; Creatinine, Ser 1.06; Hemoglobin 11.6; Platelets 205; Potassium 4.2; Sodium 138  Recent Lipid Panel    Component Value Date/Time   CHOL 129 09/09/2017 0352   CHOL 116 04/30/2017 0820   TRIG 373 (H) 09/09/2017 0352   HDL 30 (L) 09/09/2017 0352   HDL 33 (L) 04/30/2017 0820   CHOLHDL 4.3 09/09/2017 0352   VLDL 75 (H) 09/09/2017 0352   LDLCALC 24 09/09/2017 0352   LDLCALC 54 04/30/2017 0820    Physical Exam:    VS:  BP 110/72   Pulse 83   Ht 5\' 5"  (1.651 m)   Wt 198 lb 6.4 oz (90 kg)   SpO2 93%   BMI 33.02 kg/m     Wt Readings from Last 3 Encounters:  09/29/17 198 lb 6.4 oz (90 kg)  09/17/17 198 lb 1.6 oz (89.9 kg)  09/15/17 202 lb 1.9 oz (91.7 kg)     GEN:  Well nourished, well developed in no acute distress HEENT: Normal NECK: No JVD; No carotid bruits LYMPHATICS: No lymphadenopathy CARDIAC: RRR, no murmurs, no rubs, no gallops RESPIRATORY:  Clear to auscultation without rales, wheezing or rhonchi  ABDOMEN: Soft, non-tender, non-distended MUSCULOSKELETAL:  No edema; No deformity  SKIN: Warm and dry LOWER EXTREMITIES: no swelling NEUROLOGIC:  Alert and oriented x 3 PSYCHIATRIC:  Normal affect   ASSESSMENT:    1. Coronary artery disease of native artery of native  heart with stable angina pectoris ([place])   2. Angina pectoris ([place])   3. Dyslipidemia    PLAN:    In order of problems listed above:  1. Coronary artery disease status post recent repeated cardiac catheterization showing stent being patent however he does have multiple small vessel disease that required medical management.  He is already on aspirin Plavix which I will continue his on beta-blocker as well as Imdur and will initiate ranolazine 500 twice daily 2. Angina pectoris: Plan as outlined above. 3. Dyslipidemia he is on high intensity statin which I will continue.   Medication Adjustments/Labs and Tests Ordered: Current medicines are reviewed at length with the patient today.  Concerns regarding medicines are outlined above.  No orders of the defined types were placed in this encounter.  Medication changes: No orders of the defined types were placed in this encounter.   Signed, Park Liter, MD, [facility] 09/29/2017 11:50 AM    [place]

## 2017-09-29 NOTE — Patient Instructions (Signed)
Medication Instructions:  Your physician has recommended you make the following change in your medication:  START ranolazine (ranexa) 500 mg twice daily  Labwork: None  Testing/Procedures: None  Follow-Up: Your physician recommends that you schedule a follow-up appointment in: 3 weeks.   If you need a refill on your cardiac medications before your next appointment, please call your pharmacy.   Thank you for choosing [facility]! Robyne Peers, RN [phone]

## 2017-10-05 DIAGNOSIS — Z7982 Long term (current) use of aspirin: Secondary | ICD-10-CM | POA: Diagnosis not present

## 2017-10-05 DIAGNOSIS — Z79899 Other long term (current) drug therapy: Secondary | ICD-10-CM | POA: Diagnosis not present

## 2017-10-05 DIAGNOSIS — Z955 Presence of coronary angioplasty implant and graft: Secondary | ICD-10-CM | POA: Diagnosis not present

## 2017-10-05 DIAGNOSIS — I2510 Atherosclerotic heart disease of native coronary artery without angina pectoris: Secondary | ICD-10-CM | POA: Diagnosis not present

## 2017-10-05 DIAGNOSIS — I252 Old myocardial infarction: Secondary | ICD-10-CM | POA: Diagnosis not present

## 2017-10-05 DIAGNOSIS — E782 Mixed hyperlipidemia: Secondary | ICD-10-CM | POA: Diagnosis not present

## 2017-10-05 DIAGNOSIS — I10 Essential (primary) hypertension: Secondary | ICD-10-CM | POA: Diagnosis not present

## 2017-10-11 DIAGNOSIS — M48062 Spinal stenosis, lumbar region with neurogenic claudication: Secondary | ICD-10-CM | POA: Diagnosis not present

## 2017-10-11 DIAGNOSIS — Z955 Presence of coronary angioplasty implant and graft: Secondary | ICD-10-CM | POA: Diagnosis not present

## 2017-10-11 DIAGNOSIS — M79605 Pain in left leg: Secondary | ICD-10-CM | POA: Diagnosis not present

## 2017-10-11 DIAGNOSIS — M2560 Stiffness of unspecified joint, not elsewhere classified: Secondary | ICD-10-CM | POA: Diagnosis not present

## 2017-10-11 DIAGNOSIS — M545 Low back pain: Secondary | ICD-10-CM | POA: Diagnosis not present

## 2017-10-11 DIAGNOSIS — M79604 Pain in right leg: Secondary | ICD-10-CM | POA: Diagnosis not present

## 2017-10-11 DIAGNOSIS — I208 Other forms of angina pectoris: Secondary | ICD-10-CM | POA: Diagnosis not present

## 2017-10-11 DIAGNOSIS — R2689 Other abnormalities of gait and mobility: Secondary | ICD-10-CM | POA: Diagnosis not present

## 2017-10-20 ENCOUNTER — Ambulatory Visit: Payer: PPO | Admitting: Cardiology

## 2017-11-19 ENCOUNTER — Other Ambulatory Visit: Payer: Self-pay

## 2017-11-19 ENCOUNTER — Telehealth: Payer: Self-pay | Admitting: Cardiology

## 2017-11-19 MED ORDER — NITROGLYCERIN 0.4 MG SL SUBL: 0.4000 mg | SUBLINGUAL_TABLET | SUBLINGUAL | 11 refills | Status: DC | PRN

## 2017-11-19 NOTE — Telephone Encounter (Signed)
Need more detail regarding the pharmacy??

## 2017-11-19 NOTE — Telephone Encounter (Signed)
Call [facility] to [phone] [facility] [place]

## 2017-11-24 ENCOUNTER — Ambulatory Visit (INDEPENDENT_AMBULATORY_CARE_PROVIDER_SITE_OTHER): Payer: PPO | Admitting: Cardiology

## 2017-11-24 ENCOUNTER — Encounter: Payer: Self-pay | Admitting: Cardiology

## 2017-11-24 VITALS — BP 118/72 | HR 62 | Ht 65.0 in | Wt 197.0 lb

## 2017-11-24 DIAGNOSIS — I25118 Atherosclerotic heart disease of native coronary artery with other forms of angina pectoris: Secondary | ICD-10-CM | POA: Diagnosis not present

## 2017-11-24 DIAGNOSIS — E785 Hyperlipidemia, unspecified: Secondary | ICD-10-CM | POA: Diagnosis not present

## 2017-11-24 DIAGNOSIS — R072 Precordial pain: Secondary | ICD-10-CM

## 2017-11-24 HISTORY — DX: Precordial pain: R07.2

## 2017-11-24 MED ORDER — ISOSORBIDE MONONITRATE ER 120 MG PO TB24: 120.0000 mg | ORAL_TABLET | Freq: Every day | ORAL | 1 refills | Status: DC

## 2017-11-24 MED ORDER — LANSOPRAZOLE 30 MG PO CPDR: 30.0000 mg | DELAYED_RELEASE_CAPSULE | Freq: Every day | ORAL | 1 refills | Status: DC

## 2017-11-24 NOTE — Patient Instructions (Signed)
Medication Instructions:  Your physician has recommended you make the following change in your medication:  START: Preavacid (lansoprazole) 30 mg daily INCREASE: Imdur (isosorbide mononitrate) to 120 mg daily.    Labwork: None.  Testing/Procedures: None.  Follow-Up: Your physician recommends that you schedule a follow-up appointment in: 1 month.   Any Other Special Instructions Will Be Listed Below (If Applicable).     If you need a refill on your cardiac medications before your next appointment, please call your pharmacy.  Lansoprazole capsules What is this medicine? LANSOPRAZOLE (lan SOE pra zole) prevents the production of acid in the stomach. It is used to treat gastroesophageal reflux disease (GERD), ulcers, certain bacteria in the stomach, inflammation of the esophagus, and Zollinger-Ellison Syndrome. It can also be used to prevent and treat ulcers in patients taking medicines called non-steroidal anti-inflammatory drugs (NSAIDs). This medicine may be used for other purposes; ask your health care provider or pharmacist if you have questions. COMMON BRAND NAME(S): Heartburn Relief, Prevacid What should I tell my health care provider before I take this medicine? They need to know if you have any of these conditions: -liver disease -low levels of magnesium in the blood -lupus -an unusual or allergic reaction to lansoprazole, other medicines, foods, dyes, or preservatives -pregnant or trying to get pregnant -breast-feeding How should I use this medicine? Take this medicine by mouth. Swallow the capsules whole with a drink of water. Follow the directions on the prescription label. Do not crush or chew. This medicine works best if taken on an empty stomach 30 to 60 minutes before food. Take your medicine at regular intervals. Do not take more often than directed. If you have difficulty swallowing the capsules, you may open the capsule and sprinkle the contents on a tablespoon of  any of the following foods: applesauce, Ensure brand pudding, cottage cheese, yogurt, or strained pears. Do not crush the contents of the capsule into the food. Swallow the dose immediately after preparing it. Do not chew. Follow with a drink of water. Talk to your pediatrician regarding the use of this medicine in children. Special care may be needed. Overdosage: If you think you have taken too much of this medicine contact a poison control center or emergency room at once. NOTE: This medicine is only for you. Do not share this medicine with others. What if I miss a dose? If you miss a dose, take it as soon as you can. If it is almost time for your next dose, take only that dose. Do not take double or extra doses. What may interact with this medicine? Do not take this medicine with any of the following medications: -atazanavir -nelfinavir This medicine may also interact with the following medications: -ampicillin -delavirdine -digoxin -diuretics -iron salts -itraconazole, ketoconazole, voriconazole, or other prescription medicines for fungus or yeast infections -sucralfate -theophylline -warfarin This list may not describe all possible interactions. Give your health care provider a list of all the medicines, herbs, non-prescription drugs, or dietary supplements you use. Also tell them if you smoke, drink alcohol, or use illegal drugs. Some items may interact with your medicine. What should I watch for while using this medicine? It can take several days before your stomach pain gets better. Check with your doctor or health care professional if your condition does not start to get better, or if it gets worse. Do not treat diarrhea with over the counter products. Contact your doctor if you have diarrhea that lasts more than 2 days or  if it is severe and watery. You may need blood work done while you are taking this medicine. What side effects may I notice from receiving this medicine? Side  effects that you should report to your doctor or health care professional as soon as possible: -allergic reactions like skin rash, itching or hives, swelling of the face, lips, or tongue -bone, muscle or joint pain -breathing problems -chest pain or chest tightness -dark yellow or brown urine -diarrhea -dizziness -fast, irregular heartbeat -feeling faint or lightheaded -fever or sore throat -muscle spasm -palpitations -rash on cheeks or arms that gets worse in the sun -redness, blistering, peeling or loosening of the skin, including inside the mouth -seizures -tremors -unusual bleeding or bruising -unusually weak or tired -yellowing of the eyes or skin Side effects that usually do not require medical attention (report to your doctor or health care professional if they continue or are bothersome): -constipation -dry mouth -headache -loose stools -nausea This list may not describe all possible side effects. Call your doctor for medical advice about side effects. You may report side effects to FDA at 1-800-FDA-1088. Where should I keep my medicine? Keep out of the reach of children. Store at room temperature between 15 and 30 degrees C (59 and 86 degrees F). Protect from moisture. Throw away any unused medicine after the expiration date. NOTE: This sheet is a summary. It may not cover all possible information. If you have questions about this medicine, talk to your doctor, pharmacist, or health care provider.  2018 Elsevier/Gold Standard (2015-05-02 12:54:19)

## 2017-11-24 NOTE — Progress Notes (Signed)
Cardiology Office Note:    Date:  11/24/2017   ID:  Audrie Lia Olberding, DOB 1950/11/17, MRN [id number]  PCP:  [name], MD  Cardiologist:  Jenne Campus, MD    Referring MD: [name], MD   No chief complaint on file. Still having chest pain  History of Present Illness:    Robert Ramos is a 67 y.o. male with coronary artery disease recent intervention after that he get cardiac catheterization which showed patent stent and patent coronary arteries in spite of that he still experiencing chest pain.  I try ranolazine but there is no response to it.  He got pain sometimes with exercise sometimes when he does not think sometimes when he bent forward he will get it I am wondering if the etiology of his pain is different than his heart I decided to try a proton pump inhibitor to see if that helps with his symptoms.  We did talk in length about what else can be done in the situation but I think that would be the best course of action for now.  Past Medical History:  Diagnosis Date  . Arthritis    "hands, back" (11/20/2016)  . Coronary artery disease    a. MI in 2002 s/p stenting to mRCA b.cath 11/2016 s/p DES to mLAD c. Cath 08/2017- patent LAD stent, 40% instent restenosis of mRCA, 99% posterolateral artery s/p PTCA & DES.  [name] Heart attack ([place]) 2002  . History of gout   . History of kidney stones   . Hyperlipidemia   . Hypertension     Past Surgical History:  Procedure Laterality Date  . APPENDECTOMY  1974  . BACK SURGERY    . CORONARY ANGIOPLASTY WITH STENT PLACEMENT  2002; 11/20/2016   "@ [place]; @ [facility]"  . CORONARY STENT INTERVENTION N/A 11/20/2016   Procedure: CORONARY STENT INTERVENTION;  Surgeon: Nelva Bush, MD;  Location: [place] [facility];  Service: Cardiovascular;  Laterality: N/A;  . CORONARY STENT INTERVENTION N/A 09/09/2017   Procedure: CORONARY STENT INTERVENTION;  Surgeon: Burnell Blanks, MD;  Location: [place] [facility];   Service: Cardiovascular;  Laterality: N/A;  . HEMI-MICRODISCECTOMY LUMBAR LAMINECTOMY LEVEL 1 Left 2008   L4  . INTRAVASCULAR PRESSURE WIRE/FFR STUDY N/A 09/16/2017   Procedure: INTRAVASCULAR PRESSURE WIRE/FFR STUDY;  Surgeon: Belva Crome, MD;  Location: [place] [facility];  Service: Cardiovascular;  Laterality: N/A;  . INTRAVASCULAR ULTRASOUND/IVUS N/A 11/20/2016   Procedure: Intravascular Ultrasound/IVUS;  Surgeon: Nelva Bush, MD;  Location: [place] [facility];  Service: Cardiovascular;  Laterality: N/A;  . LEFT HEART CATH AND CORONARY ANGIOGRAPHY N/A 11/20/2016   Procedure: LEFT HEART CATH AND CORONARY ANGIOGRAPHY;  Surgeon: Nelva Bush, MD;  Location: [place] [facility];  Service: Cardiovascular;  Laterality: N/A;  . LEFT HEART CATH AND CORONARY ANGIOGRAPHY N/A 09/09/2017   Procedure: LEFT HEART CATH AND CORONARY ANGIOGRAPHY;  Surgeon: Burnell Blanks, MD;  Location: [place] [facility];  Service: Cardiovascular;  Laterality: N/A;  . LEFT HEART CATH AND CORONARY ANGIOGRAPHY N/A 09/16/2017   Procedure: LEFT HEART CATH AND CORONARY ANGIOGRAPHY;  Surgeon: Belva Crome, MD;  Location: [place] [facility];  Service: Cardiovascular;  Laterality: N/A;  . TONSILLECTOMY      Current Medications: Current Meds  Medication Sig  . aspirin EC 81 MG tablet Take 1 tablet (81 mg total) by mouth daily.  . clopidogrel (PLAVIX) 75 MG tablet Take 1 tablet (75 mg total)  by mouth daily with breakfast.  . colchicine 0.6 MG tablet Take 1 table three times today and then twice a day from tomorrow until seen by PCP next week  . Icosapent Ethyl (VASCEPA) 1 g CAPS Take 2 capsules (2 g total) by mouth 2 times daily at 12 noon and 4 pm.  . isosorbide mononitrate (IMDUR) 60 MG 24 hr tablet Take 1 tablet (60 mg total) by mouth daily.  [name] lisinopril (PRINIVIL,ZESTRIL) 2.5 MG tablet Take 1 tablet (2.5 mg total) by mouth daily.  . metoprolol tartrate (LOPRESSOR) 50 MG tablet Take 1 tablet (50 mg total) by  mouth 2 (two) times daily.  . nitroGLYCERIN (NITROSTAT) 0.4 MG SL tablet Place 1 tablet (0.4 mg total) under the tongue every 5 (five) minutes as needed for chest pain.  . ranolazine (RANEXA) 500 MG 12 hr tablet Take 1 tablet (500 mg total) by mouth 2 (two) times daily.  . rosuvastatin (CRESTOR) 40 MG tablet Take 1 tablet (40 mg total) by mouth daily. (Patient taking differently: Take 40 mg by mouth at bedtime. )  . Vitamin D, Ergocalciferol, (DRISDOL) 50000 units CAPS capsule Take 50,000 Units by mouth once a week.     Allergies:   Penicillin g and Zolpidem   Social History   Socioeconomic History  . Marital status: Married    Spouse name: Not on file  . Number of children: Not on file  . Years of education: Not on file  . Highest education level: Not on file  Occupational History  . Not on file  Social Needs  . Financial resource strain: Not on file  . Food insecurity:    Worry: Not on file    Inability: Not on file  . Transportation needs:    Medical: Not on file    Non-medical: Not on file  Tobacco Use  . Smoking status: Former Smoker    Packs/day: 1.00    Years: 35.00    Pack years: 35.00    Types: Cigarettes    Last attempt to quit: 02/06/2001    Years since quitting: 16.8  . Smokeless tobacco: Never Used  Substance and Sexual Activity  . Alcohol use: Yes    Alcohol/week: 15.0 standard drinks    Types: 15 Cans of beer per week  . Drug use: No  . Sexual activity: Not on file  Lifestyle  . Physical activity:    Days per week: Not on file    Minutes per session: Not on file  . Stress: Not on file  Relationships  . Social connections:    Talks on phone: Not on file    Gets together: Not on file    Attends religious service: Not on file    Active member of club or organization: Not on file    Attends meetings of clubs or organizations: Not on file    Relationship status: Not on file  Other Topics Concern  . Not on file  Social History Narrative  . Not on  file     Family History: The patient's family history includes Cancer in his father. ROS:   Please see the history of present illness.    All 14 point review of systems negative except as described per history of present illness  EKGs/Labs/Other Studies Reviewed:      Recent Labs: 04/30/2017: ALT 21 09/08/2017: Magnesium 2.0; TSH 1.305 09/17/2017: BUN 11; Creatinine, Ser 1.06; Hemoglobin 11.6; Platelets 205; Potassium 4.2; Sodium 138  Recent Lipid Panel  Component Value Date/Time   CHOL 129 09/09/2017 0352   CHOL 116 04/30/2017 0820   TRIG 373 (H) 09/09/2017 0352   HDL 30 (L) 09/09/2017 0352   HDL 33 (L) 04/30/2017 0820   CHOLHDL 4.3 09/09/2017 0352   VLDL 75 (H) 09/09/2017 0352   LDLCALC 24 09/09/2017 0352   LDLCALC 54 04/30/2017 0820    Physical Exam:    VS:  BP 118/72 (BP Location: Right Arm, Patient Position: Sitting, Cuff Size: Normal)   Pulse 62   Ht 5\' 5"  (1.651 m)   Wt 197 lb (89.4 kg)   SpO2 98%   BMI 32.78 kg/m     Wt Readings from Last 3 Encounters:  11/24/17 197 lb (89.4 kg)  09/29/17 198 lb 6.4 oz (90 kg)  09/17/17 198 lb 1.6 oz (89.9 kg)     GEN:  Well nourished, well developed in no acute distress HEENT: Normal NECK: No JVD; No carotid bruits LYMPHATICS: No lymphadenopathy CARDIAC: RRR, no murmurs, no rubs, no gallops RESPIRATORY:  Clear to auscultation without rales, wheezing or rhonchi  ABDOMEN: Soft, non-tender, non-distended MUSCULOSKELETAL:  No edema; No deformity  SKIN: Warm and dry LOWER EXTREMITIES: no swelling NEUROLOGIC:  Alert and oriented x 3 PSYCHIATRIC:  Normal affect   ASSESSMENT:    1. Coronary artery disease of native artery of native heart with stable angina pectoris ([place])   2. Precordial chest pain   3. Dyslipidemia    PLAN:    In order of problems listed above:  1. Coronary artery disease: Plan as outlined above still have some pain suspicious for coronary artery disease I will increase dose of Imdur from 60-1 20  and then will try to treat his stomach to see if that helps. 2. Precordial chest pain proton pump inhibitor will be used. 3. Dyslipidemia on statin will continue.   Medication Adjustments/Labs and Tests Ordered: Current medicines are reviewed at length with the patient today.  Concerns regarding medicines are outlined above.  No orders of the defined types were placed in this encounter.  Medication changes: No orders of the defined types were placed in this encounter.   Signed, Park Liter, MD, [facility] 11/24/2017 10:26 AM    [place]

## 2017-12-21 ENCOUNTER — Encounter: Payer: Self-pay | Admitting: Cardiology

## 2017-12-21 ENCOUNTER — Ambulatory Visit (INDEPENDENT_AMBULATORY_CARE_PROVIDER_SITE_OTHER): Payer: PPO | Admitting: Cardiology

## 2017-12-21 VITALS — BP 110/68 | HR 65 | Resp 12 | Ht 65.0 in | Wt 200.0 lb

## 2017-12-21 DIAGNOSIS — I209 Angina pectoris, unspecified: Secondary | ICD-10-CM

## 2017-12-21 DIAGNOSIS — E785 Hyperlipidemia, unspecified: Secondary | ICD-10-CM

## 2017-12-21 DIAGNOSIS — R0602 Shortness of breath: Secondary | ICD-10-CM | POA: Diagnosis not present

## 2017-12-21 DIAGNOSIS — R079 Chest pain, unspecified: Secondary | ICD-10-CM | POA: Diagnosis not present

## 2017-12-21 NOTE — Patient Instructions (Signed)
Medication Instructions:  Your physician recommends that you continue on your current medications as directed. Please refer to the Current Medication list given to you today.   Labwork: None.  Testing/Procedures: Your physician has requested that you have a lexiscan myoview. For further information please visit [email or web address]. Please follow instruction sheet, as given.    Follow-Up: Your physician recommends that you schedule a follow-up appointment in: 1 month.    Any Other Special Instructions Will Be Listed Below (If Applicable).     If you need a refill on your cardiac medications before your next appointment, please call your pharmacy.  Cardiac Nuclear Scan A cardiac nuclear scan is a test that measures blood flow to the heart when a person is resting and when he or she is exercising. The test looks for problems such as:  Not enough blood reaching a portion of the heart.  The heart muscle not working normally.  You may need this test if:  You have heart disease.  You have had abnormal lab results.  You have had heart surgery or angioplasty.  You have chest pain.  You have shortness of breath.  In this test, a radioactive dye (tracer) is injected into your bloodstream. After the tracer has traveled to your heart, an imaging device is used to measure how much of the tracer is absorbed by or distributed to various areas of your heart. This procedure is usually done at a hospital and takes 2-4 hours. Tell a health care provider about:  Any allergies you have.  All medicines you are taking, including vitamins, herbs, eye drops, creams, and over-the-counter medicines.  Any problems you or family members have had with the use of anesthetic medicines.  Any blood disorders you have.  Any surgeries you have had.  Any medical conditions you have.  Whether you are pregnant or may be pregnant. What are the risks? Generally, this is a safe procedure. However,  problems may occur, including:  Serious chest pain and heart attack. This is only a risk if the stress portion of the test is done.  Rapid heartbeat.  Sensation of warmth in your chest. This usually passes quickly.  What happens before the procedure?  Ask your health care provider about changing or stopping your regular medicines. This is especially important if you are taking diabetes medicines or blood thinners.  Remove your jewelry on the day of the procedure. What happens during the procedure?  An IV tube will be inserted into one of your veins.  Your health care provider will inject a small amount of radioactive tracer through the tube.  You will wait for 20-40 minutes while the tracer travels through your bloodstream.  Your heart activity will be monitored with an electrocardiogram (ECG).  You will lie down on an exam table.  Images of your heart will be taken for about 15-20 minutes.  You may be asked to exercise on a treadmill or stationary bike. While you exercise, your heart's activity will be monitored with an ECG, and your blood pressure will be checked. If you are unable to exercise, you may be given a medicine to increase blood flow to parts of your heart.  When blood flow to your heart has peaked, a tracer will again be injected through the IV tube.  After 20-40 minutes, you will get back on the exam table and have more images taken of your heart.  When the procedure is over, your IV tube will be removed. The procedure may vary  among health care providers and hospitals. Depending on the type of tracer used, scans may need to be repeated 3-4 hours later. What happens after the procedure?  Unless your health care provider tells you otherwise, you may return to your normal schedule, including diet, activities, and medicines.  Unless your health care provider tells you otherwise, you may increase your fluid intake. This will help flush the contrast dye from your  body. Drink enough fluid to keep your urine clear or pale yellow.  It is up to you to get your test results. Ask your health care provider, or the department that is doing the test, when your results will be ready. Summary  A cardiac nuclear scan measures the blood flow to the heart when a person is resting and when he or she is exercising.  You may need this test if you are at risk for heart disease.  Tell your health care provider if you are pregnant.  Unless your health care provider tells you otherwise, increase your fluid intake. This will help flush the contrast dye from your body. Drink enough fluid to keep your urine clear or pale yellow. This information is not intended to replace advice given to you by your health care provider. Make sure you discuss any questions you have with your health care provider. Document Released: 04/24/2004 Document Revised: 04/01/2016 Document Reviewed: 03/08/2013 Elsevier Interactive Patient Education  2017 [employer].

## 2017-12-21 NOTE — Progress Notes (Signed)
Cardiology Office Note:    Date:  12/21/2017   ID:  Robert Ramos, DOB 11-15-1950, MRN [id number]  PCP:  [name], MD  Cardiologist:  Jenne Campus, MD    Referring MD: [name], MD   No chief complaint on file. I still have a chest pain  History of Present Illness:    Robert Ramos is a 67 y.o. male with coronary artery disease status post intervention done few months ago initially he felt great after it however a few weeks later he came back to me and feeling bad again with typical exertional angina cardiac catheterization was repeated and no obstructive lesion was identified.  I try to maximize his medical therapy but in spite of that he still complain of having a lot of chest pain while walking according to his wife he slowed down dramatically he used to be very active and do a lot of things now he does not do anything because of symptoms of chest tightness.  He is already on beta-blocker and long-acting nitrate ranolazine I wanted to add calcium channel blocker to his medical regimen but he simply refused he said he started taking so many medications.  Past Medical History:  Diagnosis Date  . Arthritis    "hands, back" (11/20/2016)  . Coronary artery disease    a. MI in 2002 s/p stenting to mRCA b.cath 11/2016 s/p DES to mLAD c. Cath 08/2017- patent LAD stent, 40% instent restenosis of mRCA, 99% posterolateral artery s/p PTCA & DES.  [name] Heart attack ([place]) 2002  . History of gout   . History of kidney stones   . Hyperlipidemia   . Hypertension     Past Surgical History:  Procedure Laterality Date  . APPENDECTOMY  1974  . BACK SURGERY    . CORONARY ANGIOPLASTY WITH STENT PLACEMENT  2002; 11/20/2016   "@ [place]; @ [facility]"  . CORONARY STENT INTERVENTION N/A 11/20/2016   Procedure: CORONARY STENT INTERVENTION;  Surgeon: Nelva Bush, MD;  Location: [place] [facility];  Service: Cardiovascular;  Laterality: N/A;  . CORONARY STENT INTERVENTION N/A  09/09/2017   Procedure: CORONARY STENT INTERVENTION;  Surgeon: Burnell Blanks, MD;  Location: [place] [facility];  Service: Cardiovascular;  Laterality: N/A;  . HEMI-MICRODISCECTOMY LUMBAR LAMINECTOMY LEVEL 1 Left 2008   L4  . INTRAVASCULAR PRESSURE WIRE/FFR STUDY N/A 09/16/2017   Procedure: INTRAVASCULAR PRESSURE WIRE/FFR STUDY;  Surgeon: Belva Crome, MD;  Location: [place] [facility];  Service: Cardiovascular;  Laterality: N/A;  . INTRAVASCULAR ULTRASOUND/IVUS N/A 11/20/2016   Procedure: Intravascular Ultrasound/IVUS;  Surgeon: Nelva Bush, MD;  Location: [place] [facility];  Service: Cardiovascular;  Laterality: N/A;  . LEFT HEART CATH AND CORONARY ANGIOGRAPHY N/A 11/20/2016   Procedure: LEFT HEART CATH AND CORONARY ANGIOGRAPHY;  Surgeon: Nelva Bush, MD;  Location: [place] [facility];  Service: Cardiovascular;  Laterality: N/A;  . LEFT HEART CATH AND CORONARY ANGIOGRAPHY N/A 09/09/2017   Procedure: LEFT HEART CATH AND CORONARY ANGIOGRAPHY;  Surgeon: Burnell Blanks, MD;  Location: [place] [facility];  Service: Cardiovascular;  Laterality: N/A;  . LEFT HEART CATH AND CORONARY ANGIOGRAPHY N/A 09/16/2017   Procedure: LEFT HEART CATH AND CORONARY ANGIOGRAPHY;  Surgeon: Belva Crome, MD;  Location: [place] [facility];  Service: Cardiovascular;  Laterality: N/A;  . TONSILLECTOMY      Current Medications: Current Meds  Medication Sig  . aspirin EC 81 MG tablet Take 1 tablet (81 mg  total) by mouth daily.  . clopidogrel (PLAVIX) 75 MG tablet Take 1 tablet (75 mg total) by mouth daily with breakfast.  . Icosapent Ethyl (VASCEPA) 1 g CAPS Take 2 capsules (2 g total) by mouth 2 times daily at 12 noon and 4 pm.  . isosorbide mononitrate (IMDUR) 120 MG 24 hr tablet Take 1 tablet (120 mg total) by mouth daily.  . lansoprazole (PREVACID) 30 MG capsule Take 1 capsule (30 mg total) by mouth daily at 12 noon.  [name] lisinopril (PRINIVIL,ZESTRIL) 2.5 MG tablet Take 1 tablet (2.5 mg  total) by mouth daily.  . metoprolol tartrate (LOPRESSOR) 50 MG tablet Take 1 tablet (50 mg total) by mouth 2 (two) times daily.  . nitroGLYCERIN (NITROSTAT) 0.4 MG SL tablet Place 1 tablet (0.4 mg total) under the tongue every 5 (five) minutes as needed for chest pain.  . ranolazine (RANEXA) 500 MG 12 hr tablet Take 1 tablet (500 mg total) by mouth 2 (two) times daily.  . rosuvastatin (CRESTOR) 40 MG tablet Take 1 tablet (40 mg total) by mouth daily. (Patient taking differently: Take 40 mg by mouth at bedtime. )  . Vitamin D, Ergocalciferol, (DRISDOL) 50000 units CAPS capsule Take 50,000 Units by mouth once a week.     Allergies:   Penicillin g and Zolpidem   Social History   Socioeconomic History  . Marital status: Married    Spouse name: Not on file  . Number of children: Not on file  . Years of education: Not on file  . Highest education level: Not on file  Occupational History  . Not on file  Social Needs  . Financial resource strain: Not on file  . Food insecurity:    Worry: Not on file    Inability: Not on file  . Transportation needs:    Medical: Not on file    Non-medical: Not on file  Tobacco Use  . Smoking status: Former Smoker    Packs/day: 1.00    Years: 35.00    Pack years: 35.00    Types: Cigarettes    Last attempt to quit: 02/06/2001    Years since quitting: 16.8  . Smokeless tobacco: Never Used  Substance and Sexual Activity  . Alcohol use: Yes    Alcohol/week: 8.0 standard drinks    Types: 8 Cans of beer per week  . Drug use: No  . Sexual activity: Not on file  Lifestyle  . Physical activity:    Days per week: Not on file    Minutes per session: Not on file  . Stress: Not on file  Relationships  . Social connections:    Talks on phone: Not on file    Gets together: Not on file    Attends religious service: Not on file    Active member of club or organization: Not on file    Attends meetings of clubs or organizations: Not on file    Relationship  status: Not on file  Other Topics Concern  . Not on file  Social History Narrative  . Not on file     Family History: The patient's family history includes Cancer in his father. ROS:   Please see the history of present illness.    All 14 point review of systems negative except as described per history of present illness  EKGs/Labs/Other Studies Reviewed:      Recent Labs: 04/30/2017: ALT 21 09/08/2017: Magnesium 2.0; TSH 1.305 09/17/2017: BUN 11; Creatinine, Ser 1.06; Hemoglobin 11.6; Platelets 205;  Potassium 4.2; Sodium 138  Recent Lipid Panel    Component Value Date/Time   CHOL 129 09/09/2017 0352   CHOL 116 04/30/2017 0820   TRIG 373 (H) 09/09/2017 0352   HDL 30 (L) 09/09/2017 0352   HDL 33 (L) 04/30/2017 0820   CHOLHDL 4.3 09/09/2017 0352   VLDL 75 (H) 09/09/2017 0352   LDLCALC 24 09/09/2017 0352   LDLCALC 54 04/30/2017 0820    Physical Exam:    VS:  BP 110/68 (BP Location: Left Arm, Patient Position: Sitting)   Pulse 65   Resp 12   Ht 5\' 5"  (1.651 m)   Wt 200 lb (90.7 kg)   SpO2 97%   BMI 33.28 kg/m     Wt Readings from Last 3 Encounters:  12/21/17 200 lb (90.7 kg)  11/24/17 197 lb (89.4 kg)  09/29/17 198 lb 6.4 oz (90 kg)     GEN:  Well nourished, well developed in no acute distress HEENT: Normal NECK: No JVD; No carotid bruits LYMPHATICS: No lymphadenopathy CARDIAC: RRR, no murmurs, no rubs, no gallops RESPIRATORY:  Clear to auscultation without rales, wheezing or rhonchi  ABDOMEN: Soft, non-tender, non-distended MUSCULOSKELETAL:  No edema; No deformity  SKIN: Warm and dry LOWER EXTREMITIES: no swelling NEUROLOGIC:  Alert and oriented x 3 PSYCHIATRIC:  Normal affect   ASSESSMENT:    1. Chest pain, unspecified type   2. Angina pectoris ([place])   3. Dyslipidemia   4. Shortness of breath    PLAN:    In order of problems listed above:  1. Chest pain which appears to be typical angina pectoris.  I try to give him a proton pump inhibitor with no  help.  I think we reached the point that we need to revisit the issue of coronary artery disease I will schedule him to have stress test it will be done in form of exercise Cardiolite test to see exactly where ischemia is coming from if it is truly ischemia I will also ask 1 of my interventional colleagues to review his cardiac catheterization to see if there is anything missed. 2. Dyslipidemia continue with statin. 3. Shortness of breath multifactorial plan as outlined above.   Medication Adjustments/Labs and Tests Ordered: Current medicines are reviewed at length with the patient today.  Concerns regarding medicines are outlined above.  Orders Placed This Encounter  Procedures  . MYOCARDIAL PERFUSION IMAGING   Medication changes: No orders of the defined types were placed in this encounter.   Signed, Park Liter, MD, [facility] 12/21/2017 1:21 PM    [facility]

## 2017-12-31 DIAGNOSIS — I10 Essential (primary) hypertension: Secondary | ICD-10-CM | POA: Diagnosis not present

## 2017-12-31 DIAGNOSIS — R079 Chest pain, unspecified: Secondary | ICD-10-CM | POA: Diagnosis not present

## 2017-12-31 DIAGNOSIS — N200 Calculus of kidney: Secondary | ICD-10-CM | POA: Diagnosis not present

## 2017-12-31 DIAGNOSIS — N132 Hydronephrosis with renal and ureteral calculous obstruction: Secondary | ICD-10-CM | POA: Diagnosis not present

## 2017-12-31 DIAGNOSIS — I209 Angina pectoris, unspecified: Secondary | ICD-10-CM | POA: Diagnosis not present

## 2017-12-31 DIAGNOSIS — Z87442 Personal history of urinary calculi: Secondary | ICD-10-CM | POA: Diagnosis not present

## 2017-12-31 DIAGNOSIS — Z87891 Personal history of nicotine dependence: Secondary | ICD-10-CM | POA: Diagnosis not present

## 2017-12-31 DIAGNOSIS — I252 Old myocardial infarction: Secondary | ICD-10-CM | POA: Diagnosis not present

## 2018-01-05 ENCOUNTER — Telehealth (HOSPITAL_COMMUNITY): Payer: Self-pay

## 2018-01-05 NOTE — Telephone Encounter (Signed)
Detailed message left on the pt's answering machine. Asked to call back with any questions. S.Aman Batley EMTP 

## 2018-01-11 ENCOUNTER — Ambulatory Visit (INDEPENDENT_AMBULATORY_CARE_PROVIDER_SITE_OTHER): Payer: PPO

## 2018-01-11 VITALS — Ht 65.0 in | Wt 200.0 lb

## 2018-01-11 DIAGNOSIS — I2510 Atherosclerotic heart disease of native coronary artery without angina pectoris: Secondary | ICD-10-CM

## 2018-01-11 DIAGNOSIS — R072 Precordial pain: Secondary | ICD-10-CM

## 2018-01-11 DIAGNOSIS — R079 Chest pain, unspecified: Secondary | ICD-10-CM

## 2018-01-11 IMAGING — NM NM CHEST EXAM
6 series · 36 of 36 positions shown · non-contrast
Comparison: none

[Series 1: wbr rest · 6.40mm/px · 6 of 64 frames shown]
[frame 6/64]
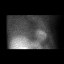
[frame 16/64]
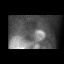
[frame 27/64]
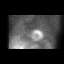
[frame 38/64]
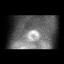
[frame 48/64]
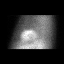
[frame 59/64]
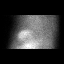

[Series 1: rest sax · 6.4mm · 6.40mm/px · 6 of 23 frames shown]
[frame 2/23]
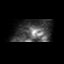
[frame 6/23]
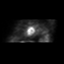
[frame 10/23]
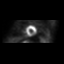
[frame 14/23]
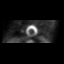
[frame 18/23]
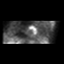
[frame 22/23]
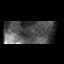

[Series 2: stress sax gs · 6.4mm · 6.40mm/px · 6 of 184 frames shown]
[frame 16/184]
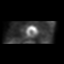
[frame 46/184]
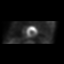
[frame 77/184]
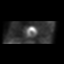
[frame 108/184]
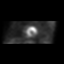
[frame 138/184]
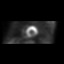
[frame 169/184]
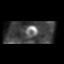

[Series 2: wbr stress-gsp · 6.40mm/px · 6 of 512 frames shown]
[frame 43/512]
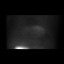
[frame 128/512]
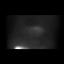
[frame 214/512]
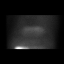
[frame 299/512]
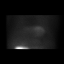
[frame 384/512]
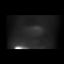
[frame 470/512]
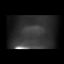

[Series 3: wbr stress-sum-em · 6.40mm/px · 6 of 64 frames shown]
[frame 6/64]
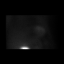
[frame 16/64]
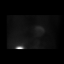
[frame 27/64]
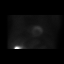
[frame 38/64]
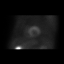
[frame 48/64]
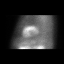
[frame 59/64]
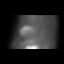

[Series 3: stress sax · 6.4mm · 6.40mm/px · 6 of 23 frames shown]
[frame 2/23]
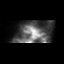
[frame 6/23]
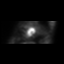
[frame 10/23]
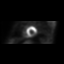
[frame 14/23]
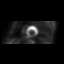
[frame 18/23]
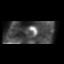
[frame 22/23]
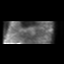

[36 of 36 positions shown; findings below may reference images not displayed]

Canned report from images found in remote index.

Refer to host system for actual result text.

## 2018-01-11 MED ORDER — TECHNETIUM TC 99M TETROFOSMIN IV KIT
10.4000 | PACK | Freq: Once | INTRAVENOUS | Status: AC | PRN
  Administered 2018-01-11: 10.4 via INTRAVENOUS

## 2018-01-11 MED ORDER — TECHNETIUM TC 99M TETROFOSMIN IV KIT
32.1000 | PACK | Freq: Once | INTRAVENOUS | Status: AC | PRN
  Administered 2018-01-11: 32.1 via INTRAVENOUS

## 2018-01-12 LAB — MYOCARDIAL PERFUSION IMAGING
CHL CUP NUCLEAR SDS: 7
CHL CUP NUCLEAR SSS: 20
CHL CUP RESTING HR STRESS: 82 {beats}/min
CSEPED: 5 min
CSEPEDS: 15 s
CSEPEW: 7 METS
CSEPPHR: 133 {beats}/min
LV dias vol: 80 mL (ref 62–150)
LV sys vol: 36 mL
MPHR: 154 {beats}/min
NUC STRESS TID: 1.01
Percent HR: 86 %
SRS: 13

## 2018-01-24 ENCOUNTER — Ambulatory Visit (INDEPENDENT_AMBULATORY_CARE_PROVIDER_SITE_OTHER): Payer: PPO | Admitting: Cardiology

## 2018-01-24 ENCOUNTER — Encounter: Payer: Self-pay | Admitting: Cardiology

## 2018-01-24 VITALS — BP 110/62 | HR 85 | Ht 65.0 in | Wt 201.8 lb

## 2018-01-24 DIAGNOSIS — G4733 Obstructive sleep apnea (adult) (pediatric): Secondary | ICD-10-CM | POA: Diagnosis not present

## 2018-01-24 DIAGNOSIS — I209 Angina pectoris, unspecified: Secondary | ICD-10-CM

## 2018-01-24 DIAGNOSIS — I2510 Atherosclerotic heart disease of native coronary artery without angina pectoris: Secondary | ICD-10-CM

## 2018-01-24 DIAGNOSIS — E785 Hyperlipidemia, unspecified: Secondary | ICD-10-CM | POA: Diagnosis not present

## 2018-01-24 MED ORDER — AMLODIPINE BESYLATE 5 MG PO TABS: 5.0000 mg | ORAL_TABLET | Freq: Every day | ORAL | 3 refills | Status: DC

## 2018-01-24 NOTE — Patient Instructions (Signed)
Medication Instructions:  Your physician has recommended you make the following change in your medication:   START: Amlodipine 5 mg daily.   If you need a refill on your cardiac medications before your next appointment, please call your pharmacy.   Lab work: None.  If you have labs (blood work) drawn today and your tests are completely normal, you will receive your results only by: [name] MyChart Message (if you have MyChart) OR . A paper copy in the mail If you have any lab test that is abnormal or we need to change your treatment, we will call you to review the results.  Testing/Procedures: None.  Follow-Up: At [facility], you and your health needs are our priority.  As part of our continuing mission to provide you with exceptional heart care, we have created designated Provider Care Teams.  These Care Teams include your primary Cardiologist (physician) and Advanced Practice Providers (APPs -  Physician Assistants and Nurse Practitioners) who all work together to provide you with the care you need, when you need it. You will need a follow up appointment in 3 months.  Please call our office 2 months in advance to schedule this appointment.  You may see Jenne Campus, MD or another member of our [place] Provider Team in [place]: Shirlee More, MD . Jyl Heinz, MD  Any Other Special Instructions Will Be Listed Below (If Applicable).   Amlodipine tablets What is this medicine? AMLODIPINE (am LOE di peen) is a calcium-channel blocker. It affects the amount of calcium found in your heart and muscle cells. This relaxes your blood vessels, which can reduce the amount of work the heart has to do. This medicine is used to lower high blood pressure. It is also used to prevent chest pain. This medicine may be used for other purposes; ask your health care provider or pharmacist if you have questions. COMMON BRAND NAME(S): Norvasc What should I tell my health care provider before I  take this medicine? They need to know if you have any of these conditions: -heart problems like heart failure or aortic stenosis -liver disease -an unusual or allergic reaction to amlodipine, other medicines, foods, dyes, or preservatives -pregnant or trying to get pregnant -breast-feeding How should I use this medicine? Take this medicine by mouth with a glass of water. Follow the directions on the prescription label. Take your medicine at regular intervals. Do not take more medicine than directed. Talk to your pediatrician regarding the use of this medicine in children. Special care may be needed. This medicine has been used in children as young as 6. Persons over 18 years old may have a stronger reaction to this medicine and need smaller doses. Overdosage: If you think you have taken too much of this medicine contact a poison control center or emergency room at once. NOTE: This medicine is only for you. Do not share this medicine with others. What if I miss a dose? If you miss a dose, take it as soon as you can. If it is almost time for your next dose, take only that dose. Do not take double or extra doses. What may interact with this medicine? -herbal or dietary supplements -local or general anesthetics -medicines for high blood pressure -medicines for prostate problems -rifampin This list may not describe all possible interactions. Give your health care provider a list of all the medicines, herbs, non-prescription drugs, or dietary supplements you use. Also tell them if you smoke, drink alcohol, or use illegal drugs.  Some items may interact with your medicine. What should I watch for while using this medicine? Visit your doctor or health care professional for regular check ups. Check your blood pressure and pulse rate regularly. Ask your health care professional what your blood pressure and pulse rate should be, and when you should contact him or her. This medicine may make you feel  confused, dizzy or lightheaded. Do not drive, use machinery, or do anything that needs mental alertness until you know how this medicine affects you. To reduce the risk of dizzy or fainting spells, do not sit or stand up quickly, especially if you are an older patient. Avoid alcoholic drinks; they can make you more dizzy. Do not suddenly stop taking amlodipine. Ask your doctor or health care professional how you can gradually reduce the dose. What side effects may I notice from receiving this medicine? Side effects that you should report to your doctor or health care professional as soon as possible: -allergic reactions like skin rash, itching or hives, swelling of the face, lips, or tongue -breathing problems -changes in vision or hearing -chest pain -fast, irregular heartbeat -swelling of legs or ankles Side effects that usually do not require medical attention (report to your doctor or health care professional if they continue or are bothersome): -dry mouth -facial flushing -nausea, vomiting -stomach gas, pain -tired, weak -trouble sleeping This list may not describe all possible side effects. Call your doctor for medical advice about side effects. You may report side effects to FDA at 1-800-FDA-1088. Where should I keep my medicine? Keep out of the reach of children. Store at room temperature between 59 and 86 degrees F (15 and 30 degrees C). Protect from light. Keep container tightly closed. Throw away any unused medicine after the expiration date. NOTE: This sheet is a summary. It may not cover all possible information. If you have questions about this medicine, talk to your doctor, pharmacist, or health care provider.  2018 Elsevier/Gold Standard (2012-02-26 11:40:58)

## 2018-01-24 NOTE — Progress Notes (Signed)
Cardiology Office Note:    Date:  01/24/2018   ID:  Robert Ramos, DOB 1951-01-10, MRN [id number]  PCP:  [name], MD  Cardiologist:  Jenne Campus, MD    Referring MD: [name], MD   Chief Complaint  Patient presents with  . Follow up on Stress test  And still have a chest pain  History of Present Illness:    Robert Ramos is a 67 y.o. male with complex past medical history but here ago he ended up having angioplasty because of accelerated symptoms he responded beautifully to the therapy was doing very well until spring of this year when he started having problems again.  He was brought to the cardiac Cath Lab and stent was implanted.  I saw him a week after that he started complaining again about typical exertional angina pectoris.  He was brought back to the cardiac cath laboratory and cardiac catheterization however did not show any new findings.  I try to manage him medically and actually put him on all possible medications, which include ranolazine, long-acting nitrates, beta-blocker, today he accepted offer of calcium channel blocker.  I put him on 5 mg Norvasc.  He also had a stress test which show fixed defect which is partially reversible involving inferior lateral wall interestingly that will have normal wall motion on gated imaging and echocardiogram did not show any old myocardial infarction.  I think we reached the point that we need to ask him to interventional cardiologist to look at this scenario including looking at all his testing to see if there is anything that we can do to improve his symptomatology.  In spite of all these maneuvers we did so far he is very symptomatic he said he is to be very active used to work all day now he cannot do it he gets angina very easily in the matter-of-fact when he gets stressed that he developed chest pain while walking on the treadmill.  Past Medical History:  Diagnosis Date  . Arthritis    "hands, back"  (11/20/2016)  . Coronary artery disease    a. MI in 2002 s/p stenting to mRCA b.cath 11/2016 s/p DES to mLAD c. Cath 08/2017- patent LAD stent, 40% instent restenosis of mRCA, 99% posterolateral artery s/p PTCA & DES.  [name] Heart attack ([place]) 2002  . History of gout   . History of kidney stones   . Hyperlipidemia   . Hypertension     Past Surgical History:  Procedure Laterality Date  . APPENDECTOMY  1974  . BACK SURGERY    . CORONARY ANGIOPLASTY WITH STENT PLACEMENT  2002; 11/20/2016   "@ [place]; @ [facility]"  . CORONARY STENT INTERVENTION N/A 11/20/2016   Procedure: CORONARY STENT INTERVENTION;  Surgeon: Nelva Bush, MD;  Location: [facility];  Service: Cardiovascular;  Laterality: N/A;  . CORONARY STENT INTERVENTION N/A 09/09/2017   Procedure: CORONARY STENT INTERVENTION;  Surgeon: Burnell Blanks, MD;  Location: [facility];  Service: Cardiovascular;  Laterality: N/A;  . HEMI-MICRODISCECTOMY LUMBAR LAMINECTOMY LEVEL 1 Left 2008   L4  . INTRAVASCULAR PRESSURE WIRE/FFR STUDY N/A 09/16/2017   Procedure: INTRAVASCULAR PRESSURE WIRE/FFR STUDY;  Surgeon: Belva Crome, MD;  Location: [facility];  Service: Cardiovascular;  Laterality: N/A;  . INTRAVASCULAR ULTRASOUND/IVUS N/A 11/20/2016   Procedure: Intravascular Ultrasound/IVUS;  Surgeon: Nelva Bush, MD;  Location: [facility];  Service: Cardiovascular;  Laterality: N/A;  . LEFT HEART  CATH AND CORONARY ANGIOGRAPHY N/A 11/20/2016   Procedure: LEFT HEART CATH AND CORONARY ANGIOGRAPHY;  Surgeon: Nelva Bush, MD;  Location: [facility];  Service: Cardiovascular;  Laterality: N/A;  . LEFT HEART CATH AND CORONARY ANGIOGRAPHY N/A 09/09/2017   Procedure: LEFT HEART CATH AND CORONARY ANGIOGRAPHY;  Surgeon: Burnell Blanks, MD;  Location: [facility];  Service: Cardiovascular;  Laterality: N/A;  . LEFT HEART CATH AND CORONARY ANGIOGRAPHY N/A 09/16/2017   Procedure: LEFT HEART CATH  AND CORONARY ANGIOGRAPHY;  Surgeon: Belva Crome, MD;  Location: [facility];  Service: Cardiovascular;  Laterality: N/A;  . TONSILLECTOMY      Current Medications: Current Meds  Medication Sig  . aspirin EC 81 MG tablet Take 1 tablet (81 mg total) by mouth daily.  . clopidogrel (PLAVIX) 75 MG tablet Take 1 tablet (75 mg total) by mouth daily with breakfast.  . colchicine 0.6 MG tablet Take 1 table three times today and then twice a day from tomorrow until seen by PCP next week  . Icosapent Ethyl (VASCEPA) 1 g CAPS Take 2 capsules (2 g total) by mouth 2 times daily at 12 noon and 4 pm.  . isosorbide mononitrate (IMDUR) 120 MG 24 hr tablet Take 1 tablet (120 mg total) by mouth daily.  . lansoprazole (PREVACID) 30 MG capsule Take 1 capsule (30 mg total) by mouth daily at 12 noon.  [name] lisinopril (PRINIVIL,ZESTRIL) 2.5 MG tablet Take 1 tablet (2.5 mg total) by mouth daily.  . metoprolol tartrate (LOPRESSOR) 50 MG tablet Take 1 tablet (50 mg total) by mouth 2 (two) times daily.  . nitroGLYCERIN (NITROSTAT) 0.4 MG SL tablet Place 1 tablet (0.4 mg total) under the tongue every 5 (five) minutes as needed for chest pain.  . ranolazine (RANEXA) 500 MG 12 hr tablet Take 1 tablet (500 mg total) by mouth 2 (two) times daily.  . rosuvastatin (CRESTOR) 40 MG tablet Take 1 tablet (40 mg total) by mouth daily. (Patient taking differently: Take 40 mg by mouth at bedtime. )  . Vitamin D, Ergocalciferol, (DRISDOL) 50000 units CAPS capsule Take 50,000 Units by mouth once a week.     Allergies:   Penicillin g and Zolpidem   Social History   Socioeconomic History  . Marital status: Married    Spouse name: Not on file  . Number of children: Not on file  . Years of education: Not on file  . Highest education level: Not on file  Occupational History  . Not on file  Social Needs  . Financial resource strain: Not on file  . Food insecurity:    Worry: Not on file    Inability: Not on file  .  Transportation needs:    Medical: Not on file    Non-medical: Not on file  Tobacco Use  . Smoking status: Former Smoker    Packs/day: 1.00    Years: 35.00    Pack years: 35.00    Types: Cigarettes    Last attempt to quit: 02/06/2001    Years since quitting: 16.9  . Smokeless tobacco: Never Used  Substance and Sexual Activity  . Alcohol use: Yes    Alcohol/week: 8.0 standard drinks    Types: 8 Cans of beer per week  . Drug use: No  . Sexual activity: Not on file  Lifestyle  . Physical activity:    Days per week: Not on file    Minutes per session: Not on file  .  Stress: Not on file  Relationships  . Social connections:    Talks on phone: Not on file    Gets together: Not on file    Attends religious service: Not on file    Active member of club or organization: Not on file    Attends meetings of clubs or organizations: Not on file    Relationship status: Not on file  Other Topics Concern  . Not on file  Social History Narrative  . Not on file     Family History: The patient's family history includes Cancer in his father. ROS:   Please see the history of present illness.    All 14 point review of systems negative except as described per history of present illness  EKGs/Labs/Other Studies Reviewed:      Recent Labs: 04/30/2017: ALT 21 09/08/2017: Magnesium 2.0; TSH 1.305 09/17/2017: BUN 11; Creatinine, Ser 1.06; Hemoglobin 11.6; Platelets 205; Potassium 4.2; Sodium 138  Recent Lipid Panel    Component Value Date/Time   CHOL 129 09/09/2017 0352   CHOL 116 04/30/2017 0820   TRIG 373 (H) 09/09/2017 0352   HDL 30 (L) 09/09/2017 0352   HDL 33 (L) 04/30/2017 0820   CHOLHDL 4.3 09/09/2017 0352   VLDL 75 (H) 09/09/2017 0352   LDLCALC 24 09/09/2017 0352   LDLCALC 54 04/30/2017 0820    Physical Exam:    VS:  BP 110/62   Pulse 85   Ht 5\' 5"  (1.651 m)   Wt 201 lb 12.8 oz (91.5 kg)   SpO2 96%   BMI 33.58 kg/m     Wt Readings from Last 3 Encounters:  01/24/18  201 lb 12.8 oz (91.5 kg)  01/11/18 200 lb (90.7 kg)  12/21/17 200 lb (90.7 kg)     GEN:  Well nourished, well developed in no acute distress HEENT: Normal NECK: No JVD; No carotid bruits LYMPHATICS: No lymphadenopathy CARDIAC: RRR, no murmurs, no rubs, no gallops RESPIRATORY:  Clear to auscultation without rales, wheezing or rhonchi  ABDOMEN: Soft, non-tender, non-distended MUSCULOSKELETAL:  No edema; No deformity  SKIN: Warm and dry LOWER EXTREMITIES: no swelling NEUROLOGIC:  Alert and oriented x 3 PSYCHIATRIC:  Normal affect   ASSESSMENT:    1. Angina pectoris ([place])   2. Coronary artery disease involving native coronary artery of native heart without angina pectoris   3. Obstructive sleep apnea   4. Dyslipidemia    PLAN:    In order of problems listed above:  1. Angina pectoris: Plan as outlined above we will schedule him to see him for intervention to see if there is any thing can be approached from interventions point review. 2. Coronary artery disease with details as described above. 3. Obstructive sleep apnea on CPAP. 4. Dyslipidemia on high intensity statin.   Medication Adjustments/Labs and Tests Ordered: Current medicines are reviewed at length with the patient today.  Concerns regarding medicines are outlined above.  No orders of the defined types were placed in this encounter.  Medication changes:  Meds ordered this encounter  Medications  . amLODipine (NORVASC) 5 MG tablet    Sig: Take 1 tablet (5 mg total) by mouth daily.    Dispense:  90 tablet    Refill:  3    Signed, Park Liter, MD, [facility] 01/24/2018 3:59 PM    [facility]

## 2018-02-02 ENCOUNTER — Telehealth: Payer: Self-pay | Admitting: Cardiology

## 2018-02-02 DIAGNOSIS — I200 Unstable angina: Secondary | ICD-10-CM

## 2018-02-02 NOTE — Telephone Encounter (Signed)
Referral has been placed. Left message for patient to return call

## 2018-02-02 NOTE — Telephone Encounter (Signed)
Patient called ans states that we are supposed to be scheduling him an appt with Dr Burt Knack??  I see no referral in the system, lese call patient.

## 2018-02-03 NOTE — Telephone Encounter (Signed)
Patient informed. 

## 2018-02-09 ENCOUNTER — Telehealth: Payer: Self-pay | Admitting: Cardiology

## 2018-02-09 NOTE — Telephone Encounter (Signed)
Is supposed to be getting an appt with Dr Burt Knack but was told the first available with him is in March and they want sooner than that

## 2018-02-10 ENCOUNTER — Encounter

## 2018-02-10 ENCOUNTER — Encounter: Payer: Self-pay | Admitting: Cardiovascular Disease

## 2018-02-10 ENCOUNTER — Ambulatory Visit: Payer: PPO | Admitting: Cardiovascular Disease

## 2018-02-10 ENCOUNTER — Other Ambulatory Visit: Payer: Self-pay | Admitting: Cardiovascular Disease

## 2018-02-10 VITALS — BP 126/74 | HR 66 | Ht 65.0 in | Wt 201.1 lb

## 2018-02-10 DIAGNOSIS — I25119 Atherosclerotic heart disease of native coronary artery with unspecified angina pectoris: Secondary | ICD-10-CM

## 2018-02-10 DIAGNOSIS — I209 Angina pectoris, unspecified: Secondary | ICD-10-CM

## 2018-02-10 DIAGNOSIS — Z7689 Persons encountering health services in other specified circumstances: Secondary | ICD-10-CM | POA: Diagnosis not present

## 2018-02-10 LAB — CBC WITH DIFFERENTIAL/PLATELET
BASOS: 0 %
Basophils Absolute: 0 10*3/uL (ref 0.0–0.2)
EOS (ABSOLUTE): 0.3 10*3/uL (ref 0.0–0.4)
EOS: 4 %
HEMATOCRIT: 39.3 % (ref 37.5–51.0)
HEMOGLOBIN: 12.7 g/dL — AB (ref 13.0–17.7)
IMMATURE GRANS (ABS): 0 10*3/uL (ref 0.0–0.1)
Immature Granulocytes: 0 %
LYMPHS: 18 %
Lymphocytes Absolute: 1.3 10*3/uL (ref 0.7–3.1)
MCH: 29.1 pg (ref 26.6–33.0)
MCHC: 32.3 g/dL (ref 31.5–35.7)
MCV: 90 fL (ref 79–97)
Monocytes Absolute: 0.5 10*3/uL (ref 0.1–0.9)
Monocytes: 6 %
NEUTROS PCT: 72 %
Neutrophils Absolute: 5.3 10*3/uL (ref 1.4–7.0)
Platelets: 232 10*3/uL (ref 150–450)
RBC: 4.37 x10E6/uL (ref 4.14–5.80)
RDW: 13.6 % (ref 12.3–15.4)
WBC: 7.4 10*3/uL (ref 3.4–10.8)

## 2018-02-10 LAB — BASIC METABOLIC PANEL
BUN/Creatinine Ratio: 10 (ref 10–24)
BUN: 12 mg/dL (ref 8–27)
CALCIUM: 9.4 mg/dL (ref 8.6–10.2)
CO2: 21 mmol/L (ref 20–29)
CREATININE: 1.17 mg/dL (ref 0.76–1.27)
Chloride: 100 mmol/L (ref 96–106)
GFR calc Af Amer: 75 mL/min/{1.73_m2} (ref >59)
GFR, EST NON AFRICAN AMERICAN: 65 mL/min/{1.73_m2} (ref >59)
Glucose: 129 mg/dL — ABNORMAL HIGH (ref 65–99)
Potassium: 4.4 mmol/L (ref 3.5–5.2)
SODIUM: 137 mmol/L (ref 134–144)

## 2018-02-10 NOTE — Telephone Encounter (Signed)
Dr. Agustin Cree will reach out to Dr. Burt Knack regarding this. Left voicemail to inform patient of this

## 2018-02-10 NOTE — Progress Notes (Signed)
Cardiology Office Note:    Date:  02/10/2018   ID:  Robert Ramos, DOB 1950/06/26, MRN [id number]  PCP:  [name], MD  Cardiologist:  Jenne Campus, MD  Electrophysiologist:  None   Referring MD: [name], MD   Chief Complaint  Patient presents with  . Chest Pain    History of Present Illness:    Robert Ramos is a 67 y.o. male with a hx of coronary artery disease and chronic exertional angina, presenting for evaluation of further treatment options for continued angina.  The patient underwent stenting of the LAD in 2018 when he presented with crescendo angina.  This was performed under guidance of intravascular ultrasound.  He returned in May 2019 with worsening anginal symptoms and underwent repeat cardiac catheterization demonstrating progressive and severe stenosis of the posterior lateral branch of the RCA.  He underwent uncomplicated PCI but did not appreciate any improvement in his symptoms of exertional angina.  Relook cardiac catheterization the following week demonstrated stable findings and pressure wire analysis of the LAD was performed.  This was negative.  Ongoing medical therapy was recommended.  The patient has continued to struggle with exertional chest discomfort and shortness of breath at low level activity.  He is on multidrug antianginal therapy on a combination of ranolazine, amlodipine, metoprolol, and high-dose isosorbide.  He describes central chest tightness just with walking to his mailbox.  There is associated shortness of breath.  Symptoms resolved after several minutes of rest.  He states that he is not even able to walk at the [employer] grocery store because of chest discomfort.  He has remained on dual antiplatelet therapy with aspirin and clopidogrel.  He has had modest improvement in anginal symptoms since starting amlodipine but continues to exhibit CCS class III symptoms.  Past Medical History:  Diagnosis Date  .  Arthritis    "hands, back" (11/20/2016)  . Coronary artery disease    a. MI in 2002 s/p stenting to mRCA b.cath 11/2016 s/p DES to mLAD c. Cath 08/2017- patent LAD stent, 40% instent restenosis of mRCA, 99% posterolateral artery s/p PTCA & DES.  [name] Heart attack ([place]) 2002  . History of gout   . History of kidney stones   . Hyperlipidemia   . Hypertension     Past Surgical History:  Procedure Laterality Date  . APPENDECTOMY  1974  . BACK SURGERY    . CORONARY ANGIOPLASTY WITH STENT PLACEMENT  2002; 11/20/2016   "@ [place]; @ [facility]"  . CORONARY STENT INTERVENTION N/A 11/20/2016   Procedure: CORONARY STENT INTERVENTION;  Surgeon: Nelva Bush, MD;  Location: [place] [facility];  Service: Cardiovascular;  Laterality: N/A;  . CORONARY STENT INTERVENTION N/A 09/09/2017   Procedure: CORONARY STENT INTERVENTION;  Surgeon: Burnell Blanks, MD;  Location: [place] [facility];  Service: Cardiovascular;  Laterality: N/A;  . HEMI-MICRODISCECTOMY LUMBAR LAMINECTOMY LEVEL 1 Left 2008   L4  . INTRAVASCULAR PRESSURE WIRE/FFR STUDY N/A 09/16/2017   Procedure: INTRAVASCULAR PRESSURE WIRE/FFR STUDY;  Surgeon: Belva Crome, MD;  Location: [place] [facility];  Service: Cardiovascular;  Laterality: N/A;  . INTRAVASCULAR ULTRASOUND/IVUS N/A 11/20/2016   Procedure: Intravascular Ultrasound/IVUS;  Surgeon: Nelva Bush, MD;  Location: [place] [facility];  Service: Cardiovascular;  Laterality: N/A;  . LEFT HEART CATH AND CORONARY ANGIOGRAPHY N/A 11/20/2016   Procedure: LEFT HEART CATH AND CORONARY ANGIOGRAPHY;  Surgeon: Nelva Bush, MD;  Location: [place] [facility];  Service:  Cardiovascular;  Laterality: N/A;  . LEFT HEART CATH AND CORONARY ANGIOGRAPHY N/A 09/09/2017   Procedure: LEFT HEART CATH AND CORONARY ANGIOGRAPHY;  Surgeon: Burnell Blanks, MD;  Location: [place] [facility];  Service: Cardiovascular;  Laterality: N/A;  . LEFT HEART CATH AND CORONARY ANGIOGRAPHY N/A 09/16/2017    Procedure: LEFT HEART CATH AND CORONARY ANGIOGRAPHY;  Surgeon: Belva Crome, MD;  Location: [place] [facility];  Service: Cardiovascular;  Laterality: N/A;  . TONSILLECTOMY      Current Medications: Current Meds  Medication Sig  . allopurinol (ZYLOPRIM) 100 MG tablet Take 1 tablet by mouth as needed for other.  [name] amLODipine (NORVASC) 5 MG tablet Take 1 tablet (5 mg total) by mouth daily.  [name] aspirin EC 81 MG tablet Take 1 tablet (81 mg total) by mouth daily.  . clopidogrel (PLAVIX) 75 MG tablet Take 1 tablet (75 mg total) by mouth daily with breakfast.  . Icosapent Ethyl (VASCEPA) 1 g CAPS Take 2 capsules (2 g total) by mouth 2 times daily at 12 noon and 4 pm.  . isosorbide mononitrate (IMDUR) 120 MG 24 hr tablet Take 1 tablet (120 mg total) by mouth daily.  [name] lisinopril (PRINIVIL,ZESTRIL) 2.5 MG tablet Take 1 tablet (2.5 mg total) by mouth daily.  . metoprolol tartrate (LOPRESSOR) 50 MG tablet Take 1 tablet (50 mg total) by mouth 2 (two) times daily.  . nitroGLYCERIN (NITROSTAT) 0.4 MG SL tablet Place 1 tablet (0.4 mg total) under the tongue every 5 (five) minutes as needed for chest pain.  . ranolazine (RANEXA) 500 MG 12 hr tablet Take 1 tablet (500 mg total) by mouth 2 (two) times daily.  . rosuvastatin (CRESTOR) 40 MG tablet Take 40 mg by mouth daily.  . Vitamin D, Ergocalciferol, (DRISDOL) 50000 units CAPS capsule Take 50,000 Units by mouth once a week.     Allergies:   Penicillin g and Zolpidem   Social History   Socioeconomic History  . Marital status: Married    Spouse name: Not on file  . Number of children: Not on file  . Years of education: Not on file  . Highest education level: Not on file  Occupational History  . Not on file  Social Needs  . Financial resource strain: Not on file  . Food insecurity:    Worry: Not on file    Inability: Not on file  . Transportation needs:    Medical: Not on file    Non-medical: Not on file  Tobacco Use  . Smoking status: Former  Smoker    Packs/day: 1.00    Years: 35.00    Pack years: 35.00    Types: Cigarettes    Last attempt to quit: 02/06/2001    Years since quitting: 17.0  . Smokeless tobacco: Never Used  Substance and Sexual Activity  . Alcohol use: Yes    Alcohol/week: 8.0 standard drinks    Types: 8 Cans of beer per week  . Drug use: No  . Sexual activity: Not on file  Lifestyle  . Physical activity:    Days per week: Not on file    Minutes per session: Not on file  . Stress: Not on file  Relationships  . Social connections:    Talks on phone: Not on file    Gets together: Not on file    Attends religious service: Not on file    Active member of club or organization: Not on file    Attends meetings of  clubs or organizations: Not on file    Relationship status: Not on file  Other Topics Concern  . Not on file  Social History Narrative  . Not on file     Family History: The patient's family history includes Cancer in his father.  ROS:   Please see the history of present illness.    Positive for back pain. All other systems reviewed and are negative.  EKGs/Labs/Other Studies Reviewed:    The following studies were reviewed today: Cardiac catheterization data was reviewed.  Images were reviewed with the patient and his wife today from his last 2 heart catheterizations.  Recent nuclear stress test is reviewed.  Nuclear stress test 01-11-2018: Study Highlights    Nuclear stress EF: 54%.  Blood pressure demonstrated a normal response to exercise.  Horizontal ST segment depression ST segment depression was noted during stress in the II, III, aVF, V5 and V6 leads, beginning at 2 minutes of stress, ending at 8 minutes of stress.  Defect 1: There is a large defect of severe severity present in the basal inferior, basal inferolateral, mid inferior and mid inferolateral location.  Defect 2: There is a medium defect of moderate severity present in the apical inferior location.  This is an  intermediate risk study.  The left ventricular ejection fraction is mildly decreased (45-54%).   Summary Defect 1:  There is a large defect of severe severity present in the basal inferior, basal inferolateral, mid inferior and mid inferolateral location. The defect is non-reversible.  Defect 2:  There is a medium defect of moderate severity present in the apical inferior location. The defect is partially reversible.     EKG:  EKG is ordered today.  The ekg ordered today demonstrates normal sinus rhythm 66 bpm, inferior infarct age undetermined.  Recent Labs: 04/30/2017: ALT 21 09/08/2017: Magnesium 2.0; TSH 1.305 09/17/2017: BUN 11; Creatinine, Ser 1.06; Hemoglobin 11.6; Platelets 205; Potassium 4.2; Sodium 138  Recent Lipid Panel    Component Value Date/Time   CHOL 129 09/09/2017 0352   CHOL 116 04/30/2017 0820   TRIG 373 (H) 09/09/2017 0352   HDL 30 (L) 09/09/2017 0352   HDL 33 (L) 04/30/2017 0820   CHOLHDL 4.3 09/09/2017 0352   VLDL 75 (H) 09/09/2017 0352   LDLCALC 24 09/09/2017 0352   LDLCALC 54 04/30/2017 0820    Physical Exam:    VS:  BP 126/74   Pulse 66   Ht 5\' 5"  (1.651 m)   Wt 201 lb 1.9 oz (91.2 kg)   SpO2 95%   BMI 33.47 kg/m     Wt Readings from Last 3 Encounters:  02/10/18 201 lb 1.9 oz (91.2 kg)  01/24/18 201 lb 12.8 oz (91.5 kg)  01/11/18 200 lb (90.7 kg)     GEN:  Well nourished, well developed in no acute distress HEENT: Normal NECK: No JVD; No carotid bruits LYMPHATICS: No lymphadenopathy CARDIAC: RRR, no murmurs, rubs, gallops RESPIRATORY:  Clear to auscultation without rales, wheezing or rhonchi  ABDOMEN: Soft, non-tender, non-distended MUSCULOSKELETAL:  No edema; No deformity  SKIN: Warm and dry NEUROLOGIC:  Alert and oriented x 3 PSYCHIATRIC:  Normal affect   ASSESSMENT:    1. Coronary artery disease involving native coronary artery of native heart with angina pectoris ([place])    PLAN:    In order of problems listed above:  1. I  had extensive discussion with the patient and his wife today.  He is clearly on a maximal medical program with  for antianginal drugs as well as dual antiplatelet therapy and a high intensity statin drug.  In reviewing his angiograms, he last was found to have continued patency of the stented segments of the LAD and the right posterolateral branch.  There is small vessel disease involving the obtuse marginal branches of the circumflex but they are too small for PCI or coronary bypass.  The LAD was interrogated with pressure wire analysis and did not demonstrate findings consistent with hemodynamic significance.  The patient does have a moderate lesion in the distal RCA that I would estimate at 50 to 60%.  If his anatomy is stable, it would be reasonable to perform flow wire analysis of this lesion and treated if it is hemodynamically significant.  It is also possible that he has developed progressive disease based on the results of his recent stress test which demonstrated significant EKG changes at low level activity associated with chest pain and a moderate fixed defect in the inferior wall with some reversibility.  I have reviewed the risks, indications, and alternatives to cardiac catheterization, possible angioplasty, and stenting with the patient. Risks include but are not limited to bleeding, infection, vascular injury, stroke, myocardial infection, arrhythmia, kidney injury, radiation-related injury in the case of prolonged fluoroscopy use, emergency cardiac surgery, and death. The patient understands the risks of serious complication is 1-2 in 0630 with diagnostic cardiac cath and 1-2% or less with angioplasty/stenting.    Medication Adjustments/Labs and Tests Ordered: Current medicines are reviewed at length with the patient today.  Concerns regarding medicines are outlined above.  No orders of the defined types were placed in this encounter.  No orders of the defined types were placed in this  encounter.   Patient Instructions  Medication Instructions:  Your provider recommends that you continue on your current medications as directed. Please refer to the Current Medication list given to you today.    Labwork: None  Testing/Procedures: Your physician has requested that you have a cardiac catheterization. Cardiac catheterization is used to diagnose and/or treat various heart conditions. Doctors may recommend this procedure for a number of different reasons. The most common reason is to evaluate chest pain. Chest pain can be a symptom of coronary artery disease (CAD), and cardiac catheterization can show whether plaque is narrowing or blocking your heart's arteries. This procedure is also used to evaluate the valves, as well as measure the blood flow and oxygen levels in different parts of your heart. For further information please visit [email or web address]. Please follow instruction sheet, as given.  Follow-Up: Please call when you are ready to proceed with your catheterization!  Any Other Special Instructions Will Be Listed Below (If Applicable).  Coronary Angiogram With Stent Coronary angiogram with stent placement is a procedure to widen or open a narrow blood vessel of the heart (coronary artery). Arteries may become blocked by cholesterol buildup (plaques) in the lining of the wall. When a coronary artery becomes partially blocked, blood flow to that area decreases. This may lead to chest pain or a heart attack (myocardial infarction). A stent is a small piece of metal that looks like mesh or a spring. Stent placement may be done as treatment for a heart attack or right after a coronary angiogram in which a blocked artery is found. Let your health care provider know about:  Any allergies you have.  All medicines you are taking, including vitamins, herbs, eye drops, creams, and over-the-counter medicines.  Any problems you or family members have had with  anesthetic  medicines.  Any blood disorders you have.  Any surgeries you have had.  Any medical conditions you have.  Whether you are pregnant or may be pregnant. What are the risks? Generally, this is a safe procedure. However, problems may occur, including:  Damage to the heart or its blood vessels.  A return of blockage.  Bleeding, infection, or bruising at the insertion site.  A collection of blood under the skin (hematoma) at the insertion site.  A blood clot in another part of the body.  Kidney injury.  Allergic reaction to the dye or contrast that is used.  Bleeding into the abdomen (retroperitoneal bleeding).  What happens before the procedure? Staying hydrated Follow instructions from your health care provider about hydration, which may include:  Up to 2 hours before the procedure - you may continue to drink clear liquids, such as water, clear fruit juice, black coffee, and plain tea.  Eating and drinking restrictions Follow instructions from your health care provider about eating and drinking, which may include:  8 hours before the procedure - stop eating heavy meals or foods such as meat, fried foods, or fatty foods.  6 hours before the procedure - stop eating light meals or foods, such as toast or cereal.  2 hours before the procedure - stop drinking clear liquids.  Ask your health care provider about:  Changing or stopping your regular medicines. This is especially important if you are taking diabetes medicines or blood thinners.  Taking medicines such as ibuprofen. These medicines can thin your blood. Do not take these medicines before your procedure if your health care provider instructs you not to. Generally, aspirin is recommended before a procedure of passing a small, thin tube (catheter) through a blood vessel and into the heart (cardiac catheterization).  What happens during the procedure?  An IV tube will be inserted into one of your veins.  You will be  given one or more of the following: ? A medicine to help you relax (sedative). ? A medicine to numb the area where the catheter will be inserted into an artery (local anesthetic).  To reduce your risk of infection: ? Your health care team will wash or sanitize their hands. ? Your skin will be washed with soap. ? Hair may be removed from the area where the catheter will be inserted.  Using a guide wire, the catheter will be inserted into an artery. The location may be in your groin, in your wrist, or in the fold of your arm (near your elbow).  A type of X-ray (fluoroscopy) will be used to help guide the catheter to the opening of the arteries in the heart.  A dye will be injected into the catheter, and X-rays will be taken. The dye will help to show where any narrowing or blockages are located in the arteries.  A tiny wire will be guided to the blocked spot, and a balloon will be inflated to make the artery wider.  The stent will be expanded and will crush the plaques into the wall of the vessel. The stent will hold the area open and improve the blood flow. Most stents have a drug coating to reduce the risk of the stent narrowing over time.  The artery may be made wider using a drill, laser, or other tools to remove plaques.  When the blood flow is better, the catheter will be removed. The lining of the artery will grow over the stent, which stays where it  was placed. This procedure may vary among health care providers and hospitals. What happens after the procedure?  If the procedure is done through the leg, you will be kept in bed lying flat for about 6 hours. You will be instructed to not bend and not cross your legs.  The insertion site will be checked frequently.  The pulse in your foot or wrist will be checked frequently.  You may have additional blood tests, X-rays, and a test that records the electrical activity of your heart (electrocardiogram, or ECG). This information is not  intended to replace advice given to you by your health care provider. Make sure you discuss any questions you have with your health care provider. Document Released: 10/04/2002 Document Revised: 11/28/2015 Document Reviewed: 11/03/2015 Elsevier Interactive Patient Education  2018 [place], Sherren Mocha, MD  02/10/2018 9:45 AM    [place]

## 2018-02-10 NOTE — H&P (View-Only) (Signed)
Cardiology Office Note:    Date:  02/10/2018   ID:  Robert Ramos, DOB Dec 23, 1950, MRN [id number]  PCP:  [name], MD  Cardiologist:  Jenne Campus, MD  Electrophysiologist:  None   Referring MD: [name], MD   Chief Complaint  Patient presents with  . Chest Pain    History of Present Illness:    Robert Ramos is a 67 y.o. male with a hx of coronary artery disease and chronic exertional angina, presenting for evaluation of further treatment options for continued angina.  The patient underwent stenting of the LAD in 2018 when he presented with crescendo angina.  This was performed under guidance of intravascular ultrasound.  He returned in May 2019 with worsening anginal symptoms and underwent repeat cardiac catheterization demonstrating progressive and severe stenosis of the posterior lateral branch of the RCA.  He underwent uncomplicated PCI but did not appreciate any improvement in his symptoms of exertional angina.  Relook cardiac catheterization the following week demonstrated stable findings and pressure wire analysis of the LAD was performed.  This was negative.  Ongoing medical therapy was recommended.  The patient has continued to struggle with exertional chest discomfort and shortness of breath at low level activity.  He is on multidrug antianginal therapy on a combination of ranolazine, amlodipine, metoprolol, and high-dose isosorbide.  He describes central chest tightness just with walking to his mailbox.  There is associated shortness of breath.  Symptoms resolved after several minutes of rest.  He states that he is not even able to walk at the [employer] grocery store because of chest discomfort.  He has remained on dual antiplatelet therapy with aspirin and clopidogrel.  He has had modest improvement in anginal symptoms since starting amlodipine but continues to exhibit CCS class III symptoms.  Past Medical History:  Diagnosis Date  .  Arthritis    "hands, back" (11/20/2016)  . Coronary artery disease    a. MI in 2002 s/p stenting to mRCA b.cath 11/2016 s/p DES to mLAD c. Cath 08/2017- patent LAD stent, 40% instent restenosis of mRCA, 99% posterolateral artery s/p PTCA & DES.  [name] Heart attack ([place]) 2002  . History of gout   . History of kidney stones   . Hyperlipidemia   . Hypertension     Past Surgical History:  Procedure Laterality Date  . APPENDECTOMY  1974  . BACK SURGERY    . CORONARY ANGIOPLASTY WITH STENT PLACEMENT  2002; 11/20/2016   "@ [place]; @ [facility]"  . CORONARY STENT INTERVENTION N/A 11/20/2016   Procedure: CORONARY STENT INTERVENTION;  Surgeon: Nelva Bush, MD;  Location: [facility];  Service: Cardiovascular;  Laterality: N/A;  . CORONARY STENT INTERVENTION N/A 09/09/2017   Procedure: CORONARY STENT INTERVENTION;  Surgeon: Burnell Blanks, MD;  Location: [place];  Service: Cardiovascular;  Laterality: N/A;  . HEMI-MICRODISCECTOMY LUMBAR LAMINECTOMY LEVEL 1 Left 2008   L4  . INTRAVASCULAR PRESSURE WIRE/FFR STUDY N/A 09/16/2017   Procedure: INTRAVASCULAR PRESSURE WIRE/FFR STUDY;  Surgeon: Belva Crome, MD;  Location: [place] [facility];  Service: Cardiovascular;  Laterality: N/A;  . INTRAVASCULAR ULTRASOUND/IVUS N/A 11/20/2016   Procedure: Intravascular Ultrasound/IVUS;  Surgeon: Nelva Bush, MD;  Location: [facility] [facility];  Service: Cardiovascular;  Laterality: N/A;  . LEFT HEART CATH AND CORONARY ANGIOGRAPHY N/A 11/20/2016   Procedure: LEFT HEART CATH AND CORONARY ANGIOGRAPHY;  Surgeon: Nelva Bush, MD;  Location: [facility];  Service:  Cardiovascular;  Laterality: N/A;  . LEFT HEART CATH AND CORONARY ANGIOGRAPHY N/A 09/09/2017   Procedure: LEFT HEART CATH AND CORONARY ANGIOGRAPHY;  Surgeon: Burnell Blanks, MD;  Location: [place] [facility];  Service: Cardiovascular;  Laterality: N/A;  . LEFT HEART CATH AND CORONARY ANGIOGRAPHY N/A 09/16/2017    Procedure: LEFT HEART CATH AND CORONARY ANGIOGRAPHY;  Surgeon: Belva Crome, MD;  Location: [facility];  Service: Cardiovascular;  Laterality: N/A;  . TONSILLECTOMY      Current Medications: Current Meds  Medication Sig  . allopurinol (ZYLOPRIM) 100 MG tablet Take 1 tablet by mouth as needed for other.  [name] amLODipine (NORVASC) 5 MG tablet Take 1 tablet (5 mg total) by mouth daily.  [name] aspirin EC 81 MG tablet Take 1 tablet (81 mg total) by mouth daily.  . clopidogrel (PLAVIX) 75 MG tablet Take 1 tablet (75 mg total) by mouth daily with breakfast.  . Icosapent Ethyl (VASCEPA) 1 g CAPS Take 2 capsules (2 g total) by mouth 2 times daily at 12 noon and 4 pm.  . isosorbide mononitrate (IMDUR) 120 MG 24 hr tablet Take 1 tablet (120 mg total) by mouth daily.  [name] lisinopril (PRINIVIL,ZESTRIL) 2.5 MG tablet Take 1 tablet (2.5 mg total) by mouth daily.  . metoprolol tartrate (LOPRESSOR) 50 MG tablet Take 1 tablet (50 mg total) by mouth 2 (two) times daily.  . nitroGLYCERIN (NITROSTAT) 0.4 MG SL tablet Place 1 tablet (0.4 mg total) under the tongue every 5 (five) minutes as needed for chest pain.  . ranolazine (RANEXA) 500 MG 12 hr tablet Take 1 tablet (500 mg total) by mouth 2 (two) times daily.  . rosuvastatin (CRESTOR) 40 MG tablet Take 40 mg by mouth daily.  . Vitamin D, Ergocalciferol, (DRISDOL) 50000 units CAPS capsule Take 50,000 Units by mouth once a week.     Allergies:   Penicillin g and Zolpidem   Social History   Socioeconomic History  . Marital status: Married    Spouse name: Not on file  . Number of children: Not on file  . Years of education: Not on file  . Highest education level: Not on file  Occupational History  . Not on file  Social Needs  . Financial resource strain: Not on file  . Food insecurity:    Worry: Not on file    Inability: Not on file  . Transportation needs:    Medical: Not on file    Non-medical: Not on file  Tobacco Use  . Smoking status: Former  Smoker    Packs/day: 1.00    Years: 35.00    Pack years: 35.00    Types: Cigarettes    Last attempt to quit: 02/06/2001    Years since quitting: 17.0  . Smokeless tobacco: Never Used  Substance and Sexual Activity  . Alcohol use: Yes    Alcohol/week: 8.0 standard drinks    Types: 8 Cans of beer per week  . Drug use: No  . Sexual activity: Not on file  Lifestyle  . Physical activity:    Days per week: Not on file    Minutes per session: Not on file  . Stress: Not on file  Relationships  . Social connections:    Talks on phone: Not on file    Gets together: Not on file    Attends religious service: Not on file    Active member of club or organization: Not on file    Attends meetings of  clubs or organizations: Not on file    Relationship status: Not on file  Other Topics Concern  . Not on file  Social History Narrative  . Not on file     Family History: The patient's family history includes Cancer in his father.  ROS:   Please see the history of present illness.    Positive for back pain. All other systems reviewed and are negative.  EKGs/Labs/Other Studies Reviewed:    The following studies were reviewed today: Cardiac catheterization data was reviewed.  Images were reviewed with the patient and his wife today from his last 2 heart catheterizations.  Recent nuclear stress test is reviewed.  Nuclear stress test 01-11-2018: Study Highlights    Nuclear stress EF: 54%.  Blood pressure demonstrated a normal response to exercise.  Horizontal ST segment depression ST segment depression was noted during stress in the II, III, aVF, V5 and V6 leads, beginning at 2 minutes of stress, ending at 8 minutes of stress.  Defect 1: There is a large defect of severe severity present in the basal inferior, basal inferolateral, mid inferior and mid inferolateral location.  Defect 2: There is a medium defect of moderate severity present in the apical inferior location.  This is an  intermediate risk study.  The left ventricular ejection fraction is mildly decreased (45-54%).   Summary Defect 1:  There is a large defect of severe severity present in the basal inferior, basal inferolateral, mid inferior and mid inferolateral location. The defect is non-reversible.  Defect 2:  There is a medium defect of moderate severity present in the apical inferior location. The defect is partially reversible.     EKG:  EKG is ordered today.  The ekg ordered today demonstrates normal sinus rhythm 66 bpm, inferior infarct age undetermined.  Recent Labs: 04/30/2017: ALT 21 09/08/2017: Magnesium 2.0; TSH 1.305 09/17/2017: BUN 11; Creatinine, Ser 1.06; Hemoglobin 11.6; Platelets 205; Potassium 4.2; Sodium 138  Recent Lipid Panel    Component Value Date/Time   CHOL 129 09/09/2017 0352   CHOL 116 04/30/2017 0820   TRIG 373 (H) 09/09/2017 0352   HDL 30 (L) 09/09/2017 0352   HDL 33 (L) 04/30/2017 0820   CHOLHDL 4.3 09/09/2017 0352   VLDL 75 (H) 09/09/2017 0352   LDLCALC 24 09/09/2017 0352   LDLCALC 54 04/30/2017 0820    Physical Exam:    VS:  BP 126/74   Pulse 66   Ht 5\' 5"  (1.651 m)   Wt 201 lb 1.9 oz (91.2 kg)   SpO2 95%   BMI 33.47 kg/m     Wt Readings from Last 3 Encounters:  02/10/18 201 lb 1.9 oz (91.2 kg)  01/24/18 201 lb 12.8 oz (91.5 kg)  01/11/18 200 lb (90.7 kg)     GEN:  Well nourished, well developed in no acute distress HEENT: Normal NECK: No JVD; No carotid bruits LYMPHATICS: No lymphadenopathy CARDIAC: RRR, no murmurs, rubs, gallops RESPIRATORY:  Clear to auscultation without rales, wheezing or rhonchi  ABDOMEN: Soft, non-tender, non-distended MUSCULOSKELETAL:  No edema; No deformity  SKIN: Warm and dry NEUROLOGIC:  Alert and oriented x 3 PSYCHIATRIC:  Normal affect   ASSESSMENT:    1. Coronary artery disease involving native coronary artery of native heart with angina pectoris ([place])    PLAN:    In order of problems listed above:  1. I  had extensive discussion with the patient and his wife today.  He is clearly on a maximal medical program with  for antianginal drugs as well as dual antiplatelet therapy and a high intensity statin drug.  In reviewing his angiograms, he last was found to have continued patency of the stented segments of the LAD and the right posterolateral branch.  There is small vessel disease involving the obtuse marginal branches of the circumflex but they are too small for PCI or coronary bypass.  The LAD was interrogated with pressure wire analysis and did not demonstrate findings consistent with hemodynamic significance.  The patient does have a moderate lesion in the distal RCA that I would estimate at 50 to 60%.  If his anatomy is stable, it would be reasonable to perform flow wire analysis of this lesion and treated if it is hemodynamically significant.  It is also possible that he has developed progressive disease based on the results of his recent stress test which demonstrated significant EKG changes at low level activity associated with chest pain and a moderate fixed defect in the inferior wall with some reversibility.  I have reviewed the risks, indications, and alternatives to cardiac catheterization, possible angioplasty, and stenting with the patient. Risks include but are not limited to bleeding, infection, vascular injury, stroke, myocardial infection, arrhythmia, kidney injury, radiation-related injury in the case of prolonged fluoroscopy use, emergency cardiac surgery, and death. The patient understands the risks of serious complication is 1-2 in 4627 with diagnostic cardiac cath and 1-2% or less with angioplasty/stenting.    Medication Adjustments/Labs and Tests Ordered: Current medicines are reviewed at length with the patient today.  Concerns regarding medicines are outlined above.  No orders of the defined types were placed in this encounter.  No orders of the defined types were placed in this  encounter.   Patient Instructions  Medication Instructions:  Your provider recommends that you continue on your current medications as directed. Please refer to the Current Medication list given to you today.    Labwork: None  Testing/Procedures: Your physician has requested that you have a cardiac catheterization. Cardiac catheterization is used to diagnose and/or treat various heart conditions. Doctors may recommend this procedure for a number of different reasons. The most common reason is to evaluate chest pain. Chest pain can be a symptom of coronary artery disease (CAD), and cardiac catheterization can show whether plaque is narrowing or blocking your heart's arteries. This procedure is also used to evaluate the valves, as well as measure the blood flow and oxygen levels in different parts of your heart. For further information please visit [email or web address]. Please follow instruction sheet, as given.  Follow-Up: Please call when you are ready to proceed with your catheterization!  Any Other Special Instructions Will Be Listed Below (If Applicable).  Coronary Angiogram With Stent Coronary angiogram with stent placement is a procedure to widen or open a narrow blood vessel of the heart (coronary artery). Arteries may become blocked by cholesterol buildup (plaques) in the lining of the wall. When a coronary artery becomes partially blocked, blood flow to that area decreases. This may lead to chest pain or a heart attack (myocardial infarction). A stent is a small piece of metal that looks like mesh or a spring. Stent placement may be done as treatment for a heart attack or right after a coronary angiogram in which a blocked artery is found. Let your health care provider know about:  Any allergies you have.  All medicines you are taking, including vitamins, herbs, eye drops, creams, and over-the-counter medicines.  Any problems you or family members have had with  anesthetic  medicines.  Any blood disorders you have.  Any surgeries you have had.  Any medical conditions you have.  Whether you are pregnant or may be pregnant. What are the risks? Generally, this is a safe procedure. However, problems may occur, including:  Damage to the heart or its blood vessels.  A return of blockage.  Bleeding, infection, or bruising at the insertion site.  A collection of blood under the skin (hematoma) at the insertion site.  A blood clot in another part of the body.  Kidney injury.  Allergic reaction to the dye or contrast that is used.  Bleeding into the abdomen (retroperitoneal bleeding).  What happens before the procedure? Staying hydrated Follow instructions from your health care provider about hydration, which may include:  Up to 2 hours before the procedure - you may continue to drink clear liquids, such as water, clear fruit juice, black coffee, and plain tea.  Eating and drinking restrictions Follow instructions from your health care provider about eating and drinking, which may include:  8 hours before the procedure - stop eating heavy meals or foods such as meat, fried foods, or fatty foods.  6 hours before the procedure - stop eating light meals or foods, such as toast or cereal.  2 hours before the procedure - stop drinking clear liquids.  Ask your health care provider about:  Changing or stopping your regular medicines. This is especially important if you are taking diabetes medicines or blood thinners.  Taking medicines such as ibuprofen. These medicines can thin your blood. Do not take these medicines before your procedure if your health care provider instructs you not to. Generally, aspirin is recommended before a procedure of passing a small, thin tube (catheter) through a blood vessel and into the heart (cardiac catheterization).  What happens during the procedure?  An IV tube will be inserted into one of your veins.  You will be  given one or more of the following: ? A medicine to help you relax (sedative). ? A medicine to numb the area where the catheter will be inserted into an artery (local anesthetic).  To reduce your risk of infection: ? Your health care team will wash or sanitize their hands. ? Your skin will be washed with soap. ? Hair may be removed from the area where the catheter will be inserted.  Using a guide wire, the catheter will be inserted into an artery. The location may be in your groin, in your wrist, or in the fold of your arm (near your elbow).  A type of X-ray (fluoroscopy) will be used to help guide the catheter to the opening of the arteries in the heart.  A dye will be injected into the catheter, and X-rays will be taken. The dye will help to show where any narrowing or blockages are located in the arteries.  A tiny wire will be guided to the blocked spot, and a balloon will be inflated to make the artery wider.  The stent will be expanded and will crush the plaques into the wall of the vessel. The stent will hold the area open and improve the blood flow. Most stents have a drug coating to reduce the risk of the stent narrowing over time.  The artery may be made wider using a drill, laser, or other tools to remove plaques.  When the blood flow is better, the catheter will be removed. The lining of the artery will grow over the stent, which stays where it  was placed. This procedure may vary among health care providers and hospitals. What happens after the procedure?  If the procedure is done through the leg, you will be kept in bed lying flat for about 6 hours. You will be instructed to not bend and not cross your legs.  The insertion site will be checked frequently.  The pulse in your foot or wrist will be checked frequently.  You may have additional blood tests, X-rays, and a test that records the electrical activity of your heart (electrocardiogram, or ECG). This information is not  intended to replace advice given to you by your health care provider. Make sure you discuss any questions you have with your health care provider. Document Released: 10/04/2002 Document Revised: 11/28/2015 Document Reviewed: 11/03/2015 Elsevier Interactive Patient Education  2018 Harrison, Sherren Mocha, MD  02/10/2018 9:45 AM    [place]

## 2018-02-10 NOTE — H&P (View-Only) (Signed)
Cardiology Office Note:    Date:  02/10/2018   ID:  Robert Ramos, DOB 06-Jan-1951, MRN [id number]  PCP:  [name], MD  Cardiologist:  Jenne Campus, MD  Electrophysiologist:  None   Referring MD: [name], MD   Chief Complaint  Patient presents with  . Chest Pain    History of Present Illness:    Robert Ramos is a 67 y.o. male with a hx of coronary artery disease and chronic exertional angina, presenting for evaluation of further treatment options for continued angina.  The patient underwent stenting of the LAD in 2018 when he presented with crescendo angina.  This was performed under guidance of intravascular ultrasound.  He returned in May 2019 with worsening anginal symptoms and underwent repeat cardiac catheterization demonstrating progressive and severe stenosis of the posterior lateral branch of the RCA.  He underwent uncomplicated PCI but did not appreciate any improvement in his symptoms of exertional angina.  Relook cardiac catheterization the following week demonstrated stable findings and pressure wire analysis of the LAD was performed.  This was negative.  Ongoing medical therapy was recommended.  The patient has continued to struggle with exertional chest discomfort and shortness of breath at low level activity.  He is on multidrug antianginal therapy on a combination of ranolazine, amlodipine, metoprolol, and high-dose isosorbide.  He describes central chest tightness just with walking to his mailbox.  There is associated shortness of breath.  Symptoms resolved after several minutes of rest.  He states that he is not even able to walk at the [employer] grocery store because of chest discomfort.  He has remained on dual antiplatelet therapy with aspirin and clopidogrel.  He has had modest improvement in anginal symptoms since starting amlodipine but continues to exhibit CCS class III symptoms.  Past Medical History:  Diagnosis Date  .  Arthritis    "hands, back" (11/20/2016)  . Coronary artery disease    a. MI in 2002 s/p stenting to mRCA b.cath 11/2016 s/p DES to mLAD c. Cath 08/2017- patent LAD stent, 40% instent restenosis of mRCA, 99% posterolateral artery s/p PTCA & DES.  [name] Heart attack ([place]) 2002  . History of gout   . History of kidney stones   . Hyperlipidemia   . Hypertension     Past Surgical History:  Procedure Laterality Date  . APPENDECTOMY  1974  . BACK SURGERY    . CORONARY ANGIOPLASTY WITH STENT PLACEMENT  2002; 11/20/2016   "@ [place]; @ [facility]"  . CORONARY STENT INTERVENTION N/A 11/20/2016   Procedure: CORONARY STENT INTERVENTION;  Surgeon: Nelva Bush, MD;  Location: [facility];  Service: Cardiovascular;  Laterality: N/A;  . CORONARY STENT INTERVENTION N/A 09/09/2017   Procedure: CORONARY STENT INTERVENTION;  Surgeon: Burnell Blanks, MD;  Location: [place] [facility];  Service: Cardiovascular;  Laterality: N/A;  . HEMI-MICRODISCECTOMY LUMBAR LAMINECTOMY LEVEL 1 Left 2008   L4  . INTRAVASCULAR PRESSURE WIRE/FFR STUDY N/A 09/16/2017   Procedure: INTRAVASCULAR PRESSURE WIRE/FFR STUDY;  Surgeon: Belva Crome, MD;  Location: [facility] [place];  Service: Cardiovascular;  Laterality: N/A;  . INTRAVASCULAR ULTRASOUND/IVUS N/A 11/20/2016   Procedure: Intravascular Ultrasound/IVUS;  Surgeon: Nelva Bush, MD;  Location: [place] [facility];  Service: Cardiovascular;  Laterality: N/A;  . LEFT HEART CATH AND CORONARY ANGIOGRAPHY N/A 11/20/2016   Procedure: LEFT HEART CATH AND CORONARY ANGIOGRAPHY;  Surgeon: Nelva Bush, MD;  Location: [facility];  Service:  Cardiovascular;  Laterality: N/A;  . LEFT HEART CATH AND CORONARY ANGIOGRAPHY N/A 09/09/2017   Procedure: LEFT HEART CATH AND CORONARY ANGIOGRAPHY;  Surgeon: Burnell Blanks, MD;  Location: [facility];  Service: Cardiovascular;  Laterality: N/A;  . LEFT HEART CATH AND CORONARY ANGIOGRAPHY N/A 09/16/2017    Procedure: LEFT HEART CATH AND CORONARY ANGIOGRAPHY;  Surgeon: Belva Crome, MD;  Location: [facility];  Service: Cardiovascular;  Laterality: N/A;  . TONSILLECTOMY      Current Medications: Current Meds  Medication Sig  . allopurinol (ZYLOPRIM) 100 MG tablet Take 1 tablet by mouth as needed for other.  [name] amLODipine (NORVASC) 5 MG tablet Take 1 tablet (5 mg total) by mouth daily.  [name] aspirin EC 81 MG tablet Take 1 tablet (81 mg total) by mouth daily.  . clopidogrel (PLAVIX) 75 MG tablet Take 1 tablet (75 mg total) by mouth daily with breakfast.  . Icosapent Ethyl (VASCEPA) 1 g CAPS Take 2 capsules (2 g total) by mouth 2 times daily at 12 noon and 4 pm.  . isosorbide mononitrate (IMDUR) 120 MG 24 hr tablet Take 1 tablet (120 mg total) by mouth daily.  [name] lisinopril (PRINIVIL,ZESTRIL) 2.5 MG tablet Take 1 tablet (2.5 mg total) by mouth daily.  . metoprolol tartrate (LOPRESSOR) 50 MG tablet Take 1 tablet (50 mg total) by mouth 2 (two) times daily.  . nitroGLYCERIN (NITROSTAT) 0.4 MG SL tablet Place 1 tablet (0.4 mg total) under the tongue every 5 (five) minutes as needed for chest pain.  . ranolazine (RANEXA) 500 MG 12 hr tablet Take 1 tablet (500 mg total) by mouth 2 (two) times daily.  . rosuvastatin (CRESTOR) 40 MG tablet Take 40 mg by mouth daily.  . Vitamin D, Ergocalciferol, (DRISDOL) 50000 units CAPS capsule Take 50,000 Units by mouth once a week.     Allergies:   Penicillin g and Zolpidem   Social History   Socioeconomic History  . Marital status: Married    Spouse name: Not on file  . Number of children: Not on file  . Years of education: Not on file  . Highest education level: Not on file  Occupational History  . Not on file  Social Needs  . Financial resource strain: Not on file  . Food insecurity:    Worry: Not on file    Inability: Not on file  . Transportation needs:    Medical: Not on file    Non-medical: Not on file  Tobacco Use  . Smoking status: Former  Smoker    Packs/day: 1.00    Years: 35.00    Pack years: 35.00    Types: Cigarettes    Last attempt to quit: 02/06/2001    Years since quitting: 17.0  . Smokeless tobacco: Never Used  Substance and Sexual Activity  . Alcohol use: Yes    Alcohol/week: 8.0 standard drinks    Types: 8 Cans of beer per week  . Drug use: No  . Sexual activity: Not on file  Lifestyle  . Physical activity:    Days per week: Not on file    Minutes per session: Not on file  . Stress: Not on file  Relationships  . Social connections:    Talks on phone: Not on file    Gets together: Not on file    Attends religious service: Not on file    Active member of club or organization: Not on file    Attends meetings of  clubs or organizations: Not on file    Relationship status: Not on file  Other Topics Concern  . Not on file  Social History Narrative  . Not on file     Family History: The patient's family history includes Cancer in his father.  ROS:   Please see the history of present illness.    Positive for back pain. All other systems reviewed and are negative.  EKGs/Labs/Other Studies Reviewed:    The following studies were reviewed today: Cardiac catheterization data was reviewed.  Images were reviewed with the patient and his wife today from his last 2 heart catheterizations.  Recent nuclear stress test is reviewed.  Nuclear stress test 01-11-2018: Study Highlights    Nuclear stress EF: 54%.  Blood pressure demonstrated a normal response to exercise.  Horizontal ST segment depression ST segment depression was noted during stress in the II, III, aVF, V5 and V6 leads, beginning at 2 minutes of stress, ending at 8 minutes of stress.  Defect 1: There is a large defect of severe severity present in the basal inferior, basal inferolateral, mid inferior and mid inferolateral location.  Defect 2: There is a medium defect of moderate severity present in the apical inferior location.  This is an  intermediate risk study.  The left ventricular ejection fraction is mildly decreased (45-54%).   Summary Defect 1:  There is a large defect of severe severity present in the basal inferior, basal inferolateral, mid inferior and mid inferolateral location. The defect is non-reversible.  Defect 2:  There is a medium defect of moderate severity present in the apical inferior location. The defect is partially reversible.     EKG:  EKG is ordered today.  The ekg ordered today demonstrates normal sinus rhythm 66 bpm, inferior infarct age undetermined.  Recent Labs: 04/30/2017: ALT 21 09/08/2017: Magnesium 2.0; TSH 1.305 09/17/2017: BUN 11; Creatinine, Ser 1.06; Hemoglobin 11.6; Platelets 205; Potassium 4.2; Sodium 138  Recent Lipid Panel    Component Value Date/Time   CHOL 129 09/09/2017 0352   CHOL 116 04/30/2017 0820   TRIG 373 (H) 09/09/2017 0352   HDL 30 (L) 09/09/2017 0352   HDL 33 (L) 04/30/2017 0820   CHOLHDL 4.3 09/09/2017 0352   VLDL 75 (H) 09/09/2017 0352   LDLCALC 24 09/09/2017 0352   LDLCALC 54 04/30/2017 0820    Physical Exam:    VS:  BP 126/74   Pulse 66   Ht 5\' 5"  (1.651 m)   Wt 201 lb 1.9 oz (91.2 kg)   SpO2 95%   BMI 33.47 kg/m     Wt Readings from Last 3 Encounters:  02/10/18 201 lb 1.9 oz (91.2 kg)  01/24/18 201 lb 12.8 oz (91.5 kg)  01/11/18 200 lb (90.7 kg)     GEN:  Well nourished, well developed in no acute distress HEENT: Normal NECK: No JVD; No carotid bruits LYMPHATICS: No lymphadenopathy CARDIAC: RRR, no murmurs, rubs, gallops RESPIRATORY:  Clear to auscultation without rales, wheezing or rhonchi  ABDOMEN: Soft, non-tender, non-distended MUSCULOSKELETAL:  No edema; No deformity  SKIN: Warm and dry NEUROLOGIC:  Alert and oriented x 3 PSYCHIATRIC:  Normal affect   ASSESSMENT:    1. Coronary artery disease involving native coronary artery of native heart with angina pectoris ([place])    PLAN:    In order of problems listed above:  1. I  had extensive discussion with the patient and his wife today.  He is clearly on a maximal medical program with  for antianginal drugs as well as dual antiplatelet therapy and a high intensity statin drug.  In reviewing his angiograms, he last was found to have continued patency of the stented segments of the LAD and the right posterolateral branch.  There is small vessel disease involving the obtuse marginal branches of the circumflex but they are too small for PCI or coronary bypass.  The LAD was interrogated with pressure wire analysis and did not demonstrate findings consistent with hemodynamic significance.  The patient does have a moderate lesion in the distal RCA that I would estimate at 50 to 60%.  If his anatomy is stable, it would be reasonable to perform flow wire analysis of this lesion and treated if it is hemodynamically significant.  It is also possible that he has developed progressive disease based on the results of his recent stress test which demonstrated significant EKG changes at low level activity associated with chest pain and a moderate fixed defect in the inferior wall with some reversibility.  I have reviewed the risks, indications, and alternatives to cardiac catheterization, possible angioplasty, and stenting with the patient. Risks include but are not limited to bleeding, infection, vascular injury, stroke, myocardial infection, arrhythmia, kidney injury, radiation-related injury in the case of prolonged fluoroscopy use, emergency cardiac surgery, and death. The patient understands the risks of serious complication is 1-2 in 7616 with diagnostic cardiac cath and 1-2% or less with angioplasty/stenting.    Medication Adjustments/Labs and Tests Ordered: Current medicines are reviewed at length with the patient today.  Concerns regarding medicines are outlined above.  No orders of the defined types were placed in this encounter.  No orders of the defined types were placed in this  encounter.   Patient Instructions  Medication Instructions:  Your provider recommends that you continue on your current medications as directed. Please refer to the Current Medication list given to you today.    Labwork: None  Testing/Procedures: Your physician has requested that you have a cardiac catheterization. Cardiac catheterization is used to diagnose and/or treat various heart conditions. Doctors may recommend this procedure for a number of different reasons. The most common reason is to evaluate chest pain. Chest pain can be a symptom of coronary artery disease (CAD), and cardiac catheterization can show whether plaque is narrowing or blocking your heart's arteries. This procedure is also used to evaluate the valves, as well as measure the blood flow and oxygen levels in different parts of your heart. For further information please visit [facility][email or web address]. Please follow instruction sheet, as given.  Follow-Up: Please call when you are ready to proceed with your catheterization!  Any Other Special Instructions Will Be Listed Below (If Applicable).  Coronary Angiogram With Stent Coronary angiogram with stent placement is a procedure to widen or open a narrow blood vessel of the heart (coronary artery). Arteries may become blocked by cholesterol buildup (plaques) in the lining of the wall. When a coronary artery becomes partially blocked, blood flow to that area decreases. This may lead to chest pain or a heart attack (myocardial infarction). A stent is a small piece of metal that looks like mesh or a spring. Stent placement may be done as treatment for a heart attack or right after a coronary angiogram in which a blocked artery is found. Let your health care provider know about:  Any allergies you have.  All medicines you are taking, including vitamins, herbs, eye drops, creams, and over-the-counter medicines.  Any problems you or family members have had with  anesthetic  medicines.  Any blood disorders you have.  Any surgeries you have had.  Any medical conditions you have.  Whether you are pregnant or may be pregnant. What are the risks? Generally, this is a safe procedure. However, problems may occur, including:  Damage to the heart or its blood vessels.  A return of blockage.  Bleeding, infection, or bruising at the insertion site.  A collection of blood under the skin (hematoma) at the insertion site.  A blood clot in another part of the body.  Kidney injury.  Allergic reaction to the dye or contrast that is used.  Bleeding into the abdomen (retroperitoneal bleeding).  What happens before the procedure? Staying hydrated Follow instructions from your health care provider about hydration, which may include:  Up to 2 hours before the procedure - you may continue to drink clear liquids, such as water, clear fruit juice, black coffee, and plain tea.  Eating and drinking restrictions Follow instructions from your health care provider about eating and drinking, which may include:  8 hours before the procedure - stop eating heavy meals or foods such as meat, fried foods, or fatty foods.  6 hours before the procedure - stop eating light meals or foods, such as toast or cereal.  2 hours before the procedure - stop drinking clear liquids.  Ask your health care provider about:  Changing or stopping your regular medicines. This is especially important if you are taking diabetes medicines or blood thinners.  Taking medicines such as ibuprofen. These medicines can thin your blood. Do not take these medicines before your procedure if your health care provider instructs you not to. Generally, aspirin is recommended before a procedure of passing a small, thin tube (catheter) through a blood vessel and into the heart (cardiac catheterization).  What happens during the procedure?  An IV tube will be inserted into one of your veins.  You will be  given one or more of the following: ? A medicine to help you relax (sedative). ? A medicine to numb the area where the catheter will be inserted into an artery (local anesthetic).  To reduce your risk of infection: ? Your health care team will wash or sanitize their hands. ? Your skin will be washed with soap. ? Hair may be removed from the area where the catheter will be inserted.  Using a guide wire, the catheter will be inserted into an artery. The location may be in your groin, in your wrist, or in the fold of your arm (near your elbow).  A type of X-ray (fluoroscopy) will be used to help guide the catheter to the opening of the arteries in the heart.  A dye will be injected into the catheter, and X-rays will be taken. The dye will help to show where any narrowing or blockages are located in the arteries.  A tiny wire will be guided to the blocked spot, and a balloon will be inflated to make the artery wider.  The stent will be expanded and will crush the plaques into the wall of the vessel. The stent will hold the area open and improve the blood flow. Most stents have a drug coating to reduce the risk of the stent narrowing over time.  The artery may be made wider using a drill, laser, or other tools to remove plaques.  When the blood flow is better, the catheter will be removed. The lining of the artery will grow over the stent, which stays where it  was placed. This procedure may vary among health care providers and hospitals. What happens after the procedure?  If the procedure is done through the leg, you will be kept in bed lying flat for about 6 hours. You will be instructed to not bend and not cross your legs.  The insertion site will be checked frequently.  The pulse in your foot or wrist will be checked frequently.  You may have additional blood tests, X-rays, and a test that records the electrical activity of your heart (electrocardiogram, or ECG). This information is not  intended to replace advice given to you by your health care provider. Make sure you discuss any questions you have with your health care provider. Document Released: 10/04/2002 Document Revised: 11/28/2015 Document Reviewed: 11/03/2015 Elsevier Interactive Patient Education  2018 [place], Sherren Mocha, MD  02/10/2018 9:45 AM    [place]

## 2018-02-10 NOTE — Patient Instructions (Signed)
Medication Instructions:  Your provider recommends that you continue on your current medications as directed. Please refer to the Current Medication list given to you today.    Labwork: None  Testing/Procedures: Your physician has requested that you have a cardiac catheterization. Cardiac catheterization is used to diagnose and/or treat various heart conditions. Doctors may recommend this procedure for a number of different reasons. The most common reason is to evaluate chest pain. Chest pain can be a symptom of coronary artery disease (CAD), and cardiac catheterization can show whether plaque is narrowing or blocking your heart's arteries. This procedure is also used to evaluate the valves, as well as measure the blood flow and oxygen levels in different parts of your heart. For further information please visit [facility]iest[facility][email or web address]. Please follow instruction sheet, as given.  Follow-Up: Please call when you are ready to proceed with your catheterization!  Any Other Special Instructions Will Be Listed Below (If Applicable).  Coronary Angiogram With Stent Coronary angiogram with stent placement is a procedure to widen or open a narrow blood vessel of the heart (coronary artery). Arteries may become blocked by cholesterol buildup (plaques) in the lining of the wall. When a coronary artery becomes partially blocked, blood flow to that area decreases. This may lead to chest pain or a heart attack (myocardial infarction). A stent is a small piece of metal that looks like mesh or a spring. Stent placement may be done as treatment for a heart attack or right after a coronary angiogram in which a blocked artery is found. Let your health care provider know about:  Any allergies you have.  All medicines you are taking, including vitamins, herbs, eye drops, creams, and over-the-counter medicines.  Any problems you or family members have had with anesthetic medicines.  Any blood disorders you  have.  Any surgeries you have had.  Any medical conditions you have.  Whether you are pregnant or may be pregnant. What are the risks? Generally, this is a safe procedure. However, problems may occur, including:  Damage to the heart or its blood vessels.  A return of blockage.  Bleeding, infection, or bruising at the insertion site.  A collection of blood under the skin (hematoma) at the insertion site.  A blood clot in another part of the body.  Kidney injury.  Allergic reaction to the dye or contrast that is used.  Bleeding into the abdomen (retroperitoneal bleeding).  What happens before the procedure? Staying hydrated Follow instructions from your health care provider about hydration, which may include:  Up to 2 hours before the procedure - you may continue to drink clear liquids, such as water, clear fruit juice, black coffee, and plain tea.  Eating and drinking restrictions Follow instructions from your health care provider about eating and drinking, which may include:  8 hours before the procedure - stop eating heavy meals or foods such as meat, fried foods, or fatty foods.  6 hours before the procedure - stop eating light meals or foods, such as toast or cereal.  2 hours before the procedure - stop drinking clear liquids.  Ask your health care provider about:  Changing or stopping your regular medicines. This is especially important if you are taking diabetes medicines or blood thinners.  Taking medicines such as ibuprofen. These medicines can thin your blood. Do not take these medicines before your procedure if your health care provider instructs you not to. Generally, aspirin is recommended before a procedure of passing a small, thin tube (catheter) through  a blood vessel and into the heart (cardiac catheterization).  What happens during the procedure?  An IV tube will be inserted into one of your veins.  You will be given one or more of the  following: ? A medicine to help you relax (sedative). ? A medicine to numb the area where the catheter will be inserted into an artery (local anesthetic).  To reduce your risk of infection: ? Your health care team will wash or sanitize their hands. ? Your skin will be washed with soap. ? Hair may be removed from the area where the catheter will be inserted.  Using a guide wire, the catheter will be inserted into an artery. The location may be in your groin, in your wrist, or in the fold of your arm (near your elbow).  A type of X-ray (fluoroscopy) will be used to help guide the catheter to the opening of the arteries in the heart.  A dye will be injected into the catheter, and X-rays will be taken. The dye will help to show where any narrowing or blockages are located in the arteries.  A tiny wire will be guided to the blocked spot, and a balloon will be inflated to make the artery wider.  The stent will be expanded and will crush the plaques into the wall of the vessel. The stent will hold the area open and improve the blood flow. Most stents have a drug coating to reduce the risk of the stent narrowing over time.  The artery may be made wider using a drill, laser, or other tools to remove plaques.  When the blood flow is better, the catheter will be removed. The lining of the artery will grow over the stent, which stays where it was placed. This procedure may vary among health care providers and hospitals. What happens after the procedure?  If the procedure is done through the leg, you will be kept in bed lying flat for about 6 hours. You will be instructed to not bend and not cross your legs.  The insertion site will be checked frequently.  The pulse in your foot or wrist will be checked frequently.  You may have additional blood tests, X-rays, and a test that records the electrical activity of your heart (electrocardiogram, or ECG). This information is not intended to replace  advice given to you by your health care provider. Make sure you discuss any questions you have with your health care provider. Document Released: 10/04/2002 Document Revised: 11/28/2015 Document Reviewed: 11/03/2015 Elsevier Interactive Patient Education  [employer].

## 2018-02-10 NOTE — Telephone Encounter (Signed)
Patient saw Dr. Burt Knack today per Dr. Agustin Cree.

## 2018-02-14 ENCOUNTER — Other Ambulatory Visit: Payer: Self-pay | Admitting: Physician Assistant

## 2018-02-14 NOTE — Addendum Note (Signed)
Addended by: Harland German A on: 02/14/2018 11:12 AM   Modules accepted: Orders

## 2018-02-17 ENCOUNTER — Telehealth: Payer: Self-pay | Admitting: *Deleted

## 2018-02-17 NOTE — Telephone Encounter (Addendum)
Pt contacted pre-catheterization scheduled at [facility] for: Monday February 21, 2018 7:30 AM Verified arrival time and place: [place] Entrance A at: 5:30 AM  No solid food after midnight prior to cath, clear liquids until 5 AM day of procedure. Contrast allergy: no Verify no diabetes medications.  AM meds can be  taken pre-cath with sip of water including: ASA 81 mg Clopidogrel 75 mg  Confirm patient has responsible person to drive home post procedure and for 24 hours after you arrive home: yes  LMTCB to review instructions with patient.

## 2018-02-17 NOTE — Telephone Encounter (Signed)
Discussed instructions with patient, he verbalized understanding, thanked me for call.

## 2018-02-21 ENCOUNTER — Other Ambulatory Visit: Payer: Self-pay

## 2018-02-21 ENCOUNTER — Encounter (HOSPITAL_COMMUNITY): Payer: Self-pay | Admitting: Cardiovascular Disease

## 2018-02-21 ENCOUNTER — Ambulatory Visit (HOSPITAL_COMMUNITY)
Admission: RE | Admit: 2018-02-21 | Discharge: 2018-02-21 | Disposition: A | Discharge status: Home / Self Care | Payer: PPO | Source: Ambulatory Visit | Attending: Cardiovascular Disease | Admitting: Cardiovascular Disease

## 2018-02-21 ENCOUNTER — Encounter (HOSPITAL_COMMUNITY)
Admission: RE | Disposition: A | Discharge status: Home / Self Care | Payer: Self-pay | Source: Ambulatory Visit | Attending: Cardiovascular Disease

## 2018-02-21 DIAGNOSIS — M109 Gout, unspecified: Secondary | ICD-10-CM | POA: Diagnosis not present

## 2018-02-21 DIAGNOSIS — Z888 Allergy status to other drugs, medicaments and biological substances status: Secondary | ICD-10-CM | POA: Diagnosis not present

## 2018-02-21 DIAGNOSIS — Z981 Arthrodesis status: Secondary | ICD-10-CM | POA: Insufficient documentation

## 2018-02-21 DIAGNOSIS — Z87891 Personal history of nicotine dependence: Secondary | ICD-10-CM | POA: Diagnosis not present

## 2018-02-21 DIAGNOSIS — Z7982 Long term (current) use of aspirin: Secondary | ICD-10-CM | POA: Insufficient documentation

## 2018-02-21 DIAGNOSIS — Z9889 Other specified postprocedural states: Secondary | ICD-10-CM | POA: Diagnosis not present

## 2018-02-21 DIAGNOSIS — M1990 Unspecified osteoarthritis, unspecified site: Secondary | ICD-10-CM | POA: Diagnosis not present

## 2018-02-21 DIAGNOSIS — I252 Old myocardial infarction: Secondary | ICD-10-CM | POA: Diagnosis not present

## 2018-02-21 DIAGNOSIS — Z955 Presence of coronary angioplasty implant and graft: Secondary | ICD-10-CM | POA: Insufficient documentation

## 2018-02-21 DIAGNOSIS — Z880 Allergy status to penicillin: Secondary | ICD-10-CM | POA: Insufficient documentation

## 2018-02-21 DIAGNOSIS — Z87442 Personal history of urinary calculi: Secondary | ICD-10-CM | POA: Insufficient documentation

## 2018-02-21 DIAGNOSIS — I25118 Atherosclerotic heart disease of native coronary artery with other forms of angina pectoris: Secondary | ICD-10-CM | POA: Diagnosis not present

## 2018-02-21 DIAGNOSIS — Z79899 Other long term (current) drug therapy: Secondary | ICD-10-CM | POA: Insufficient documentation

## 2018-02-21 DIAGNOSIS — I10 Essential (primary) hypertension: Secondary | ICD-10-CM | POA: Insufficient documentation

## 2018-02-21 DIAGNOSIS — Z7902 Long term (current) use of antithrombotics/antiplatelets: Secondary | ICD-10-CM | POA: Insufficient documentation

## 2018-02-21 DIAGNOSIS — E785 Hyperlipidemia, unspecified: Secondary | ICD-10-CM | POA: Insufficient documentation

## 2018-02-21 DIAGNOSIS — I209 Angina pectoris, unspecified: Secondary | ICD-10-CM | POA: Diagnosis present

## 2018-02-21 HISTORY — PX: LEFT HEART CATH AND CORONARY ANGIOGRAPHY: CATH118249

## 2018-02-21 SURGERY — LEFT HEART CATH AND CORONARY ANGIOGRAPHY
Anesthesia: LOCAL

## 2018-02-21 MED ORDER — MIDAZOLAM HCL 2 MG/2ML IJ SOLN
INTRAMUSCULAR | Status: AC
  Filled 2018-02-21: qty 2

## 2018-02-21 MED ORDER — SODIUM CHLORIDE 0.9 % WEIGHT BASED INFUSION
3.0000 mL/kg/h | INTRAVENOUS | Status: AC
  Administered 2018-02-21: 3 mL/kg/h via INTRAVENOUS

## 2018-02-21 MED ORDER — SODIUM CHLORIDE 0.9% FLUSH: 3.0000 mL | INTRAVENOUS | Status: DC | PRN

## 2018-02-21 MED ORDER — HEPARIN (PORCINE) IN NACL 1000-0.9 UT/500ML-% IV SOLN
INTRAVENOUS | Status: AC
  Filled 2018-02-21: qty 1000

## 2018-02-21 MED ORDER — VERAPAMIL HCL 2.5 MG/ML IV SOLN
INTRAVENOUS | Status: DC | PRN
  Administered 2018-02-21: 10 mL via INTRA_ARTERIAL

## 2018-02-21 MED ORDER — LIDOCAINE HCL (PF) 1 % IJ SOLN
INTRAMUSCULAR | Status: AC
  Filled 2018-02-21: qty 30

## 2018-02-21 MED ORDER — FENTANYL CITRATE (PF) 100 MCG/2ML IJ SOLN
INTRAMUSCULAR | Status: AC
  Filled 2018-02-21: qty 2

## 2018-02-21 MED ORDER — SODIUM CHLORIDE 0.9 % IV SOLN: 250.0000 mL | INTRAVENOUS | Status: DC | PRN

## 2018-02-21 MED ORDER — HEPARIN SODIUM (PORCINE) 1000 UNIT/ML IJ SOLN
INTRAMUSCULAR | Status: DC | PRN
  Administered 2018-02-21: 8000 [IU] via INTRAVENOUS

## 2018-02-21 MED ORDER — IOHEXOL 350 MG/ML SOLN
INTRAVENOUS | Status: DC | PRN
  Administered 2018-02-21: 60 mL via INTRA_ARTERIAL

## 2018-02-21 MED ORDER — ASPIRIN 81 MG PO CHEW: 81.0000 mg | CHEWABLE_TABLET | ORAL | Status: AC

## 2018-02-21 MED ORDER — LIDOCAINE HCL (PF) 1 % IJ SOLN
INTRAMUSCULAR | Status: DC | PRN
  Administered 2018-02-21: 2 mL via INTRADERMAL

## 2018-02-21 MED ORDER — MIDAZOLAM HCL 2 MG/2ML IJ SOLN
INTRAMUSCULAR | Status: DC | PRN
  Administered 2018-02-21: 1 mg via INTRAVENOUS

## 2018-02-21 MED ORDER — CLOPIDOGREL BISULFATE 75 MG PO TABS: 75.0000 mg | ORAL_TABLET | ORAL | Status: AC

## 2018-02-21 MED ORDER — HEPARIN (PORCINE) IN NACL 1000-0.9 UT/500ML-% IV SOLN
INTRAVENOUS | Status: DC | PRN
  Administered 2018-02-21 (×2): 500 mL

## 2018-02-21 MED ORDER — FENTANYL CITRATE (PF) 100 MCG/2ML IJ SOLN
INTRAMUSCULAR | Status: DC | PRN
  Administered 2018-02-21: 25 ug via INTRAVENOUS

## 2018-02-21 MED ORDER — VERAPAMIL HCL 2.5 MG/ML IV SOLN
INTRAVENOUS | Status: AC
  Filled 2018-02-21: qty 2

## 2018-02-21 MED ORDER — SODIUM CHLORIDE 0.9 % WEIGHT BASED INFUSION
1.0000 mL/kg/h | INTRAVENOUS | Status: DC
  Administered 2018-02-21: 10.954 mL via INTRAVENOUS

## 2018-02-21 MED ORDER — SODIUM CHLORIDE 0.9% FLUSH: 3.0000 mL | Freq: Two times a day (BID) | INTRAVENOUS | Status: DC

## 2018-02-21 SURGICAL SUPPLY — 9 items
CATH 5FR JL3.5 JR4 ANG PIG MP (CATHETERS) ×2 IMPLANT
DEVICE RAD COMP TR BAND LRG (VASCULAR PRODUCTS) ×2 IMPLANT
GLIDESHEATH SLEND SS 6F .021 (SHEATH) ×2 IMPLANT
GUIDEWIRE INQWIRE 1.5J.035X260 (WIRE) ×1 IMPLANT
INQWIRE 1.5J .035X260CM (WIRE) ×2
KIT HEART LEFT (KITS) ×2 IMPLANT
PACK CARDIAC CATHETERIZATION (CUSTOM PROCEDURE TRAY) ×2 IMPLANT
TRANSDUCER W/STOPCOCK (MISCELLANEOUS) ×2 IMPLANT
TUBING CIL FLEX 10 FLL-RA (TUBING) ×2 IMPLANT

## 2018-02-21 NOTE — Discharge Instructions (Signed)
PCI/CTO  Come to Admitting on November 20th at 6:30am for procedure at 8:30.  Nothing to eat or drink after midnight on Tuesday night.  Take all morning home meds the morning of procedure.  You will spend the night usually go home the next day.      Radial Site Care Refer to this sheet in the next few weeks. These instructions provide you with information about caring for yourself after your procedure. Your health care provider may also give you more specific instructions. Your treatment has been planned according to current medical practices, but problems sometimes occur. Call your health care provider if you have any problems or questions after your procedure. What can I expect after the procedure? After your procedure, it is typical to have the following:  Bruising at the radial site that usually fades within 1-2 weeks.  Blood collecting in the tissue (hematoma) that may be painful to the touch. It should usually decrease in size and tenderness within 1-2 weeks.  Follow these instructions at home:  Take medicines only as directed by your health care provider.  You may shower 24-48 hours after the procedure or as directed by your health care provider. Remove the bandage (dressing) and gently wash the site with plain soap and water. Pat the area dry with a clean towel. Do not rub the site, because this may cause bleeding.  Do not take baths, swim, or use a hot tub until your health care provider approves.  Check your insertion site every day for redness, swelling, or drainage.  Do not apply powder or lotion to the site.  Do not flex or bend the affected arm for 24 hours or as directed by your health care provider.  Do not push or pull heavy objects with the affected arm for 24 hours or as directed by your health care provider.  Do not lift over 10 lb (4.5 kg) for 5 days after your procedure or as directed by your health care provider.  Ask your health care provider when it is okay  to: ? Return to work or school. ? Resume usual physical activities or sports. ? Resume sexual activity.  Do not drive home if you are discharged the same day as the procedure. Have someone else drive you.  You may drive 24 hours after the procedure unless otherwise instructed by your health care provider.  Do not operate machinery or power tools for 24 hours after the procedure.  If your procedure was done as an outpatient procedure, which means that you went home the same day as your procedure, a responsible adult should be with you for the first 24 hours after you arrive home.  Keep all follow-up visits as directed by your health care provider. This is important. Contact a health care provider if:  You have a fever.  You have chills.  You have increased bleeding from the radial site. Hold pressure on the site. Get help right away if:  You have unusual pain at the radial site.  You have redness, warmth, or swelling at the radial site.  You have drainage (other than a small amount of blood on the dressing) from the radial site.  The radial site is bleeding, and the bleeding does not stop after 30 minutes of holding steady pressure on the site.  Your arm or hand becomes pale, cool, tingly, or numb. This information is not intended to replace advice given to you by your health care provider. Make sure you discuss  any questions you have with your health care provider. Document Released: 05/02/2010 Document Revised: 09/05/2015 Document Reviewed: 10/16/2013 Elsevier Interactive Patient Education  2018 [employer].

## 2018-02-21 NOTE — Interval H&P Note (Signed)
Cath Lab Visit (complete for each Cath Lab visit)  Clinical Evaluation Leading to the Procedure:   ACS: No.  Non-ACS:    Anginal Classification: CCS III  Anti-ischemic medical therapy: Maximal Therapy (2 or more classes of medications)  Non-Invasive Test Results: Intermediate-risk stress test findings: cardiac mortality 1-3%/year  Prior CABG: No previous CABG      History and Physical Interval Note:  02/21/2018 7:31 AM  Robert Ramos  has presented today for surgery, with the diagnosis of cad - angina  The various methods of treatment have been discussed with the patient and family. After consideration of risks, benefits and other options for treatment, the patient has consented to  Procedure(s): LEFT HEART CATH AND CORONARY ANGIOGRAPHY (N/A) as a surgical intervention .  The patient's history has been reviewed, patient examined, no change in status, stable for surgery.  I have reviewed the patient's chart and labs.  Questions were answered to the patient's satisfaction.     Sherren Mocha

## 2018-02-21 NOTE — Progress Notes (Signed)
Discussed with patient about pre procedure instructions that were added to discharge paperwork

## 2018-02-24 ENCOUNTER — Telehealth: Payer: Self-pay | Admitting: *Deleted

## 2018-02-24 DIAGNOSIS — Z01812 Encounter for preprocedural laboratory examination: Secondary | ICD-10-CM

## 2018-02-24 DIAGNOSIS — I25119 Atherosclerotic heart disease of native coronary artery with unspecified angina pectoris: Secondary | ICD-10-CM

## 2018-02-24 NOTE — Telephone Encounter (Signed)
Pt contacted pre-procedure  scheduled at [facility] for: Wednesday March 02, 2018 8:30 AM Verified arrival time and place: [place] Entrance A at: 6:30 AM  No solid food after midnight prior to cath, clear liquids until 5 AM day of procedure. Contrast allergy: no Verified no diabetes medications: no  AM meds can be  taken pre-cath with sip of water including: ASA 81 mg Clopidogrel 75 mg  Confirm patient has responsible person to drive home post procedure and for 24 hours after you arrive home   I spoke with patient and asked him to come to [facility] Monday February 28, 2018 for pre-procedure BMP/CBC. I will plan to follow-up with patient prior to procedure.

## 2018-02-28 ENCOUNTER — Encounter (INDEPENDENT_AMBULATORY_CARE_PROVIDER_SITE_OTHER): Payer: Self-pay

## 2018-02-28 ENCOUNTER — Other Ambulatory Visit: Payer: Self-pay | Admitting: Cardiology

## 2018-02-28 ENCOUNTER — Telehealth: Payer: Self-pay

## 2018-02-28 ENCOUNTER — Other Ambulatory Visit: Payer: PPO | Admitting: *Deleted

## 2018-02-28 DIAGNOSIS — Z01812 Encounter for preprocedural laboratory examination: Secondary | ICD-10-CM

## 2018-02-28 DIAGNOSIS — I25119 Atherosclerotic heart disease of native coronary artery with unspecified angina pectoris: Secondary | ICD-10-CM

## 2018-02-28 LAB — BASIC METABOLIC PANEL
BUN / CREAT RATIO: 8 — AB (ref 10–24)
BUN: 10 mg/dL (ref 8–27)
CALCIUM: 9.7 mg/dL (ref 8.6–10.2)
CO2: 24 mmol/L (ref 20–29)
CREATININE: 1.29 mg/dL — AB (ref 0.76–1.27)
Chloride: 99 mmol/L (ref 96–106)
GFR calc non Af Amer: 57 mL/min/{1.73_m2} — ABNORMAL LOW (ref >59)
GFR, EST AFRICAN AMERICAN: 66 mL/min/{1.73_m2} (ref >59)
Glucose: 110 mg/dL — ABNORMAL HIGH (ref 65–99)
Potassium: 4.7 mmol/L (ref 3.5–5.2)
Sodium: 139 mmol/L (ref 134–144)

## 2018-02-28 LAB — CBC
HEMATOCRIT: 39 % (ref 37.5–51.0)
Hemoglobin: 12.6 g/dL — ABNORMAL LOW (ref 13.0–17.7)
MCH: 29.2 pg (ref 26.6–33.0)
MCHC: 32.3 g/dL (ref 31.5–35.7)
MCV: 90 fL (ref 79–97)
Platelets: 271 10*3/uL (ref 150–450)
RBC: 4.32 x10E6/uL (ref 4.14–5.80)
RDW: 13.3 % (ref 12.3–15.4)
WBC: 6.9 10*3/uL (ref 3.4–10.8)

## 2018-02-28 NOTE — Telephone Encounter (Signed)
Pt came in for labs today and I gave him his pre-procedure info that was noted by Desiree Lucy Rn.. Pt verbalized understanding of his instructions.

## 2018-03-01 NOTE — Telephone Encounter (Signed)
I discussed instructions with patient, asked him to hold lisinopril AM of procedure, drink plenty of water today to hydrate pre-procedure, he verbalized understanding, thanked me for call.

## 2018-03-02 ENCOUNTER — Ambulatory Visit (HOSPITAL_COMMUNITY)
Admission: RE | Admit: 2018-03-02 | Discharge: 2018-03-03 | Disposition: A | Discharge status: Home / Self Care | Payer: PPO | Source: Ambulatory Visit | Attending: Cardiology | Admitting: Cardiology

## 2018-03-02 ENCOUNTER — Other Ambulatory Visit: Payer: Self-pay

## 2018-03-02 ENCOUNTER — Ambulatory Visit (HOSPITAL_COMMUNITY)
Admission: RE | Disposition: A | Discharge status: Home / Self Care | Payer: Self-pay | Source: Ambulatory Visit | Attending: Cardiology

## 2018-03-02 ENCOUNTER — Encounter (HOSPITAL_COMMUNITY): Payer: Self-pay | Admitting: Cardiology

## 2018-03-02 DIAGNOSIS — Z23 Encounter for immunization: Secondary | ICD-10-CM | POA: Diagnosis not present

## 2018-03-02 DIAGNOSIS — I252 Old myocardial infarction: Secondary | ICD-10-CM | POA: Insufficient documentation

## 2018-03-02 DIAGNOSIS — Z955 Presence of coronary angioplasty implant and graft: Secondary | ICD-10-CM | POA: Diagnosis not present

## 2018-03-02 DIAGNOSIS — Z981 Arthrodesis status: Secondary | ICD-10-CM | POA: Diagnosis not present

## 2018-03-02 DIAGNOSIS — I10 Essential (primary) hypertension: Secondary | ICD-10-CM | POA: Insufficient documentation

## 2018-03-02 DIAGNOSIS — Z9889 Other specified postprocedural states: Secondary | ICD-10-CM | POA: Diagnosis not present

## 2018-03-02 DIAGNOSIS — Z7902 Long term (current) use of antithrombotics/antiplatelets: Secondary | ICD-10-CM | POA: Diagnosis not present

## 2018-03-02 DIAGNOSIS — Z87891 Personal history of nicotine dependence: Secondary | ICD-10-CM | POA: Diagnosis not present

## 2018-03-02 DIAGNOSIS — I209 Angina pectoris, unspecified: Secondary | ICD-10-CM | POA: Diagnosis present

## 2018-03-02 DIAGNOSIS — I25119 Atherosclerotic heart disease of native coronary artery with unspecified angina pectoris: Secondary | ICD-10-CM | POA: Insufficient documentation

## 2018-03-02 DIAGNOSIS — I2582 Chronic total occlusion of coronary artery: Secondary | ICD-10-CM

## 2018-03-02 DIAGNOSIS — M109 Gout, unspecified: Secondary | ICD-10-CM | POA: Diagnosis not present

## 2018-03-02 DIAGNOSIS — Z880 Allergy status to penicillin: Secondary | ICD-10-CM | POA: Insufficient documentation

## 2018-03-02 DIAGNOSIS — Z79899 Other long term (current) drug therapy: Secondary | ICD-10-CM | POA: Diagnosis not present

## 2018-03-02 DIAGNOSIS — E785 Hyperlipidemia, unspecified: Secondary | ICD-10-CM | POA: Diagnosis present

## 2018-03-02 DIAGNOSIS — Z888 Allergy status to other drugs, medicaments and biological substances status: Secondary | ICD-10-CM | POA: Diagnosis not present

## 2018-03-02 DIAGNOSIS — Z87442 Personal history of urinary calculi: Secondary | ICD-10-CM | POA: Diagnosis not present

## 2018-03-02 DIAGNOSIS — M1990 Unspecified osteoarthritis, unspecified site: Secondary | ICD-10-CM | POA: Insufficient documentation

## 2018-03-02 DIAGNOSIS — E782 Mixed hyperlipidemia: Secondary | ICD-10-CM | POA: Diagnosis present

## 2018-03-02 DIAGNOSIS — Z7982 Long term (current) use of aspirin: Secondary | ICD-10-CM | POA: Diagnosis not present

## 2018-03-02 DIAGNOSIS — I2510 Atherosclerotic heart disease of native coronary artery without angina pectoris: Secondary | ICD-10-CM | POA: Diagnosis present

## 2018-03-02 HISTORY — DX: Gout, unspecified: M10.9

## 2018-03-02 HISTORY — PX: ULTRASOUND GUIDANCE FOR VASCULAR ACCESS: SHX6516

## 2018-03-02 HISTORY — DX: Other chronic pain: G89.29

## 2018-03-02 HISTORY — PX: CORONARY CTO INTERVENTION: CATH118236

## 2018-03-02 HISTORY — DX: Radiculopathy, lumbar region: M54.16

## 2018-03-02 LAB — POCT ACTIVATED CLOTTING TIME
ACTIVATED CLOTTING TIME: 268 s
ACTIVATED CLOTTING TIME: 279 s
ACTIVATED CLOTTING TIME: 335 s
Activated Clotting Time: 268 seconds
Activated Clotting Time: 285 seconds
Activated Clotting Time: 296 seconds
Activated Clotting Time: 301 seconds
Activated Clotting Time: 384 seconds
Activated Clotting Time: 224 s
Activated Clotting Time: 180 seconds
Activated Clotting Time: 153 seconds

## 2018-03-02 SURGERY — CORONARY CTO INTERVENTION
Anesthesia: LOCAL

## 2018-03-02 MED ORDER — MIDAZOLAM HCL 2 MG/2ML IJ SOLN
INTRAMUSCULAR | Status: AC
  Filled 2018-03-02: qty 2

## 2018-03-02 MED ORDER — VERAPAMIL HCL 2.5 MG/ML IV SOLN
INTRAVENOUS | Status: AC
  Filled 2018-03-02: qty 2

## 2018-03-02 MED ORDER — FENTANYL CITRATE (PF) 100 MCG/2ML IJ SOLN
INTRAMUSCULAR | Status: AC
  Filled 2018-03-02: qty 2

## 2018-03-02 MED ORDER — LISINOPRIL 5 MG PO TABS
2.5000 mg | ORAL_TABLET | Freq: Every day | ORAL | Status: DC
  Administered 2018-03-03: 2.5 mg via ORAL
  Filled 2018-03-02: qty 1

## 2018-03-02 MED ORDER — ASPIRIN 81 MG PO CHEW: 81.0000 mg | CHEWABLE_TABLET | ORAL | Status: DC

## 2018-03-02 MED ORDER — LIDOCAINE HCL (PF) 1 % IJ SOLN
INTRAMUSCULAR | Status: AC
  Filled 2018-03-02: qty 30

## 2018-03-02 MED ORDER — AMLODIPINE BESYLATE 5 MG PO TABS
5.0000 mg | ORAL_TABLET | Freq: Every day | ORAL | Status: DC
  Administered 2018-03-03: 5 mg via ORAL
  Filled 2018-03-02: qty 1

## 2018-03-02 MED ORDER — SODIUM CHLORIDE 0.9% FLUSH: 3.0000 mL | INTRAVENOUS | Status: DC | PRN

## 2018-03-02 MED ORDER — CLOPIDOGREL BISULFATE 75 MG PO TABS: 75.0000 mg | ORAL_TABLET | ORAL | Status: DC

## 2018-03-02 MED ORDER — ALLOPURINOL 100 MG PO TABS
100.0000 mg | ORAL_TABLET | ORAL | Status: DC
  Administered 2018-03-03: 100 mg via ORAL
  Filled 2018-03-02: qty 1

## 2018-03-02 MED ORDER — MIDAZOLAM HCL 2 MG/2ML IJ SOLN
INTRAMUSCULAR | Status: DC | PRN
  Administered 2018-03-02: 1 mg via INTRAVENOUS
  Administered 2018-03-02 (×2): 2 mg via INTRAVENOUS
  Administered 2018-03-02 (×2): 1 mg via INTRAVENOUS

## 2018-03-02 MED ORDER — OMEGA-3-ACID ETHYL ESTERS 1 G PO CAPS
2.0000 g | ORAL_CAPSULE | Freq: Two times a day (BID) | ORAL | Status: DC
  Administered 2018-03-03: 11:00:00 2 g via ORAL
  Filled 2018-03-02: qty 2

## 2018-03-02 MED ORDER — IOHEXOL 350 MG/ML SOLN
INTRAVENOUS | Status: DC | PRN
  Administered 2018-03-02: 190 mL via INTRA_ARTERIAL

## 2018-03-02 MED ORDER — FENTANYL CITRATE (PF) 100 MCG/2ML IJ SOLN
INTRAMUSCULAR | Status: DC | PRN
  Administered 2018-03-02 (×2): 25 ug via INTRAVENOUS
  Administered 2018-03-02 (×2): 50 ug via INTRAVENOUS

## 2018-03-02 MED ORDER — SODIUM CHLORIDE 0.9 % IV SOLN: 250.0000 mL | INTRAVENOUS | Status: DC | PRN

## 2018-03-02 MED ORDER — HEPARIN (PORCINE) IN NACL 1000-0.9 UT/500ML-% IV SOLN
INTRAVENOUS | Status: DC | PRN
  Administered 2018-03-02 (×4): 500 mL

## 2018-03-02 MED ORDER — CLOPIDOGREL BISULFATE 75 MG PO TABS
75.0000 mg | ORAL_TABLET | Freq: Every day | ORAL | Status: DC
  Administered 2018-03-03: 75 mg via ORAL
  Filled 2018-03-02: qty 1

## 2018-03-02 MED ORDER — HEPARIN (PORCINE) IN NACL 1000-0.9 UT/500ML-% IV SOLN
INTRAVENOUS | Status: AC
  Filled 2018-03-02: qty 1000

## 2018-03-02 MED ORDER — ISOSORBIDE MONONITRATE ER 60 MG PO TB24
120.0000 mg | ORAL_TABLET | Freq: Every day | ORAL | Status: DC
  Administered 2018-03-03: 120 mg via ORAL
  Filled 2018-03-02: qty 2

## 2018-03-02 MED ORDER — SODIUM CHLORIDE 0.9% FLUSH: 3.0000 mL | Freq: Two times a day (BID) | INTRAVENOUS | Status: DC

## 2018-03-02 MED ORDER — METOPROLOL TARTRATE 25 MG PO TABS
50.0000 mg | ORAL_TABLET | Freq: Two times a day (BID) | ORAL | Status: DC
  Administered 2018-03-02 – 2018-03-03 (×2): 50 mg via ORAL
  Filled 2018-03-02 (×2): qty 2

## 2018-03-02 MED ORDER — NITROGLYCERIN 0.4 MG SL SUBL: 0.4000 mg | SUBLINGUAL_TABLET | SUBLINGUAL | Status: DC | PRN

## 2018-03-02 MED ORDER — OXYCODONE HCL 5 MG PO TABS: 5.0000 mg | ORAL_TABLET | Freq: Every day | ORAL | Status: DC | PRN

## 2018-03-02 MED ORDER — ONDANSETRON HCL 4 MG/2ML IJ SOLN
4.0000 mg | Freq: Four times a day (QID) | INTRAMUSCULAR | Status: DC | PRN
  Administered 2018-03-02: 17:00:00 4 mg via INTRAVENOUS
  Filled 2018-03-02: qty 2

## 2018-03-02 MED ORDER — HEPARIN SODIUM (PORCINE) 1000 UNIT/ML IJ SOLN
INTRAMUSCULAR | Status: AC
  Filled 2018-03-02: qty 1

## 2018-03-02 MED ORDER — SODIUM CHLORIDE 0.9 % IV SOLN
INTRAVENOUS | Status: AC
  Administered 2018-03-02: 14:00:00 via INTRAVENOUS

## 2018-03-02 MED ORDER — HEPARIN SODIUM (PORCINE) 1000 UNIT/ML IJ SOLN
INTRAMUSCULAR | Status: DC | PRN
  Administered 2018-03-02: 4000 [IU] via INTRAVENOUS
  Administered 2018-03-02: 9000 [IU] via INTRAVENOUS
  Administered 2018-03-02: 4000 [IU] via INTRAVENOUS
  Administered 2018-03-02: 6500 [IU] via INTRAVENOUS
  Administered 2018-03-02: 3000 [IU] via INTRAVENOUS
  Administered 2018-03-02: 4000 [IU] via INTRAVENOUS

## 2018-03-02 MED ORDER — INFLUENZA VAC SPLIT HIGH-DOSE 0.5 ML IM SUSY
0.5000 mL | PREFILLED_SYRINGE | INTRAMUSCULAR | Status: AC
  Administered 2018-03-03: 11:00:00 0.5 mL via INTRAMUSCULAR
  Filled 2018-03-02: qty 0.5

## 2018-03-02 MED ORDER — SODIUM CHLORIDE 0.9 % WEIGHT BASED INFUSION: 1.0000 mL/kg/h | INTRAVENOUS | Status: DC

## 2018-03-02 MED ORDER — OXYCODONE HCL 5 MG PO TABS
5.0000 mg | ORAL_TABLET | Freq: Four times a day (QID) | ORAL | Status: DC | PRN
  Administered 2018-03-02: 23:00:00 5 mg via ORAL
  Filled 2018-03-02: qty 1

## 2018-03-02 MED ORDER — ROSUVASTATIN CALCIUM 20 MG PO TABS
40.0000 mg | ORAL_TABLET | Freq: Every day | ORAL | Status: DC
  Administered 2018-03-03: 40 mg via ORAL
  Filled 2018-03-02: qty 2

## 2018-03-02 MED ORDER — SODIUM CHLORIDE 0.9 % WEIGHT BASED INFUSION
3.0000 mL/kg/h | INTRAVENOUS | Status: DC
  Administered 2018-03-02: 3 mL/kg/h via INTRAVENOUS

## 2018-03-02 MED ORDER — PNEUMOCOCCAL VAC POLYVALENT 25 MCG/0.5ML IJ INJ
0.5000 mL | INJECTION | INTRAMUSCULAR | Status: AC
  Administered 2018-03-03: 11:00:00 0.5 mL via INTRAMUSCULAR
  Filled 2018-03-02: qty 0.5

## 2018-03-02 MED ORDER — ASPIRIN EC 81 MG PO TBEC
81.0000 mg | DELAYED_RELEASE_TABLET | Freq: Every day | ORAL | Status: DC
  Administered 2018-03-03: 81 mg via ORAL
  Filled 2018-03-02: qty 1

## 2018-03-02 MED ORDER — NITROGLYCERIN 1 MG/10 ML FOR IR/CATH LAB
INTRA_ARTERIAL | Status: AC
  Filled 2018-03-02: qty 10

## 2018-03-02 MED ORDER — LIDOCAINE HCL (PF) 1 % IJ SOLN
INTRAMUSCULAR | Status: DC | PRN
  Administered 2018-03-02: 11 mL
  Administered 2018-03-02: 14 mL
  Administered 2018-03-02: 12 mL

## 2018-03-02 MED ORDER — RANOLAZINE ER 500 MG PO TB12
500.0000 mg | ORAL_TABLET | Freq: Two times a day (BID) | ORAL | Status: DC
  Administered 2018-03-02: 500 mg via ORAL
  Filled 2018-03-02: qty 1

## 2018-03-02 MED ORDER — ACETAMINOPHEN 325 MG PO TABS
650.0000 mg | ORAL_TABLET | ORAL | Status: DC | PRN
  Administered 2018-03-03: 650 mg via ORAL
  Filled 2018-03-02: qty 2

## 2018-03-02 MED ORDER — ICOSAPENT ETHYL 1 G PO CAPS: 2.0000 | ORAL_CAPSULE | Freq: Two times a day (BID) | ORAL | Status: DC

## 2018-03-02 SURGICAL SUPPLY — 29 items
BALLN EMERGE MR 2.0X12 (BALLOONS) ×3
BALLOON EMERGE MR 2.0X12 (BALLOONS) ×2 IMPLANT
CATH 7FR TRAPLINER (CATHETERS) ×3 IMPLANT
CATH GUIDELINER 8F (CATHETERS) ×3 IMPLANT
CATH INFINITI 5FR JL4 (CATHETERS) ×3 IMPLANT
CATH MACH1 8F CLS4 (CATHETERS) ×3 IMPLANT
CATH MACH1 8FR AL.75  90CM (CATHETERS) ×1
CATH MACH1 8FR AL.75 90CM (CATHETERS) ×2 IMPLANT
CATH TRAPPER 6-8F (CATHETERS) ×3 IMPLANT
CATH TURNPIKE 150CMT (MICROCATHETER) ×3 IMPLANT
ELECT DEFIB PAD ADLT CADENCE (PAD) ×3 IMPLANT
KIT ENCORE 26 ADVANTAGE (KITS) ×3 IMPLANT
KIT HEART LEFT (KITS) ×6 IMPLANT
KIT HEMO VALVE WATCHDOG (MISCELLANEOUS) ×6 IMPLANT
KIT MICROPUNCTURE NIT STIFF (SHEATH) ×6 IMPLANT
PACK CARDIAC CATHETERIZATION (CUSTOM PROCEDURE TRAY) ×3 IMPLANT
SHEATH BRITE TIP 8FR 35CM (SHEATH) ×3 IMPLANT
SHEATH PINNACLE 5F 10CM (SHEATH) ×3 IMPLANT
SHEATH PINNACLE 8F 10CM (SHEATH) ×3 IMPLANT
SHEATH PROBE COVER 6X72 (BAG) ×3 IMPLANT
TRANSDUCER W/STOPCOCK (MISCELLANEOUS) ×6 IMPLANT
TUBING CIL FLEX 10 FLL-RA (TUBING) ×6 IMPLANT
WIRE ASAHI PROWATER 180CM (WIRE) ×3 IMPLANT
WIRE ASAHI SION 190X3X12 .014 (WIRE) ×3 IMPLANT
WIRE EMERALD 3MM-J .035X150CM (WIRE) ×3 IMPLANT
WIRE FIGHTER CROSSING 190CM (WIRE) ×3 IMPLANT
WIRE HI TORQ PILOT-200 300CM (WIRE) ×3 IMPLANT
WIRE HORNET 14 STR TIP 19CM (WIRE) ×3 IMPLANT
WIRE MARVEL STR TIP 190CM (WIRE) ×3 IMPLANT

## 2018-03-02 NOTE — Interval H&P Note (Signed)
History and Physical Interval Note:  03/02/2018 8:42 AM  Robert Ramos  has presented today for surgery, with the diagnosis of cad  The various methods of treatment have been discussed with the patient and family. After consideration of risks, benefits and other options for treatment, the patient has consented to  Procedure(s): CORONARY CTO INTERVENTION - Right (N/A) as a surgical intervention .  The patient's history has been reviewed, patient examined, no change in status, stable for surgery.  I have reviewed the patient's chart and labs.  Questions were answered to the patient's satisfaction.   Cath Lab Visit (complete for each Cath Lab visit)  Clinical Evaluation Leading to the Procedure:   ACS: No.  Non-ACS:    Anginal Classification: CCS III  Anti-ischemic medical therapy: Maximal Therapy (2 or more classes of medications)  Non-Invasive Test Results: Intermediate-risk stress test findings: cardiac mortality 1-3%/year  Prior CABG: No previous CABG        Robert Ramos [facility] 03/02/2018 8:42 AM

## 2018-03-02 NOTE — Progress Notes (Signed)
Site area: Left groin a 8 french sheath was removed  Site Prior to Removal:  Level 0  Pressure Applied For 20 MINUTES    Bedrest Beginning at 1830p  Manual:   Yes.    Patient Status During Pull:  stable  Post Pull Groin Site:  Level 0  Post Pull Instructions Given:  Yes.    Post Pull Pulses Present:  Yes.    Dressing Applied:  Yes.    Comments:  VS remain stable

## 2018-03-02 NOTE — Progress Notes (Addendum)
Site area: Right groin a 8 french long  arterial sheath was removed  Site Prior to Removal:  Level 1  Pressure Applied For 20 MINUTES    Bedrest Beginning at 1830pm  Manual:   Yes.    Patient Status During Pull:  stable  Post Pull Groin Site:  Level 0  Post Pull Instructions Given:  Yes.    Post Pull Pulses Present:  Yes.    Dressing Applied:  Yes.    Comments:  VS remain stable

## 2018-03-03 ENCOUNTER — Telehealth: Payer: Self-pay | Admitting: Physician Assistant

## 2018-03-03 DIAGNOSIS — I25119 Atherosclerotic heart disease of native coronary artery with unspecified angina pectoris: Secondary | ICD-10-CM | POA: Diagnosis not present

## 2018-03-03 DIAGNOSIS — M1990 Unspecified osteoarthritis, unspecified site: Secondary | ICD-10-CM | POA: Diagnosis not present

## 2018-03-03 DIAGNOSIS — I209 Angina pectoris, unspecified: Secondary | ICD-10-CM | POA: Diagnosis not present

## 2018-03-03 DIAGNOSIS — I10 Essential (primary) hypertension: Secondary | ICD-10-CM | POA: Diagnosis not present

## 2018-03-03 DIAGNOSIS — Z7902 Long term (current) use of antithrombotics/antiplatelets: Secondary | ICD-10-CM | POA: Diagnosis not present

## 2018-03-03 DIAGNOSIS — Z880 Allergy status to penicillin: Secondary | ICD-10-CM | POA: Diagnosis not present

## 2018-03-03 DIAGNOSIS — I252 Old myocardial infarction: Secondary | ICD-10-CM | POA: Diagnosis not present

## 2018-03-03 DIAGNOSIS — Z79899 Other long term (current) drug therapy: Secondary | ICD-10-CM | POA: Diagnosis not present

## 2018-03-03 DIAGNOSIS — Z955 Presence of coronary angioplasty implant and graft: Secondary | ICD-10-CM | POA: Diagnosis not present

## 2018-03-03 DIAGNOSIS — I25118 Atherosclerotic heart disease of native coronary artery with other forms of angina pectoris: Secondary | ICD-10-CM

## 2018-03-03 DIAGNOSIS — M109 Gout, unspecified: Secondary | ICD-10-CM | POA: Diagnosis not present

## 2018-03-03 DIAGNOSIS — Z7982 Long term (current) use of aspirin: Secondary | ICD-10-CM | POA: Diagnosis not present

## 2018-03-03 DIAGNOSIS — Z87891 Personal history of nicotine dependence: Secondary | ICD-10-CM | POA: Diagnosis not present

## 2018-03-03 DIAGNOSIS — Z23 Encounter for immunization: Secondary | ICD-10-CM | POA: Diagnosis not present

## 2018-03-03 LAB — CBC
HCT: 33.5 % — ABNORMAL LOW (ref 39.0–52.0)
Hemoglobin: 10.2 g/dL — ABNORMAL LOW (ref 13.0–17.0)
MCH: 27.9 pg (ref 26.0–34.0)
MCHC: 30.4 g/dL (ref 30.0–36.0)
MCV: 91.8 fL (ref 80.0–100.0)
NRBC: 0 % (ref 0.0–0.2)
PLATELETS: 211 10*3/uL (ref 150–400)
RBC: 3.65 MIL/uL — AB (ref 4.22–5.81)
RDW: 13 % (ref 11.5–15.5)
WBC: 7 10*3/uL (ref 4.0–10.5)

## 2018-03-03 LAB — BASIC METABOLIC PANEL
ANION GAP: 6 (ref 5–15)
BUN: 12 mg/dL (ref 8–23)
CALCIUM: 8.6 mg/dL — AB (ref 8.9–10.3)
CO2: 26 mmol/L (ref 22–32)
Chloride: 103 mmol/L (ref 98–111)
Creatinine, Ser: 1.25 mg/dL — ABNORMAL HIGH (ref 0.61–1.24)
GFR calc Af Amer: >60 mL/min (ref >60)
GFR, EST NON AFRICAN AMERICAN: 58 mL/min — AB (ref >60)
Glucose, Bld: 131 mg/dL — ABNORMAL HIGH (ref 70–99)
POTASSIUM: 4.2 mmol/L (ref 3.5–5.1)
SODIUM: 135 mmol/L (ref 135–145)

## 2018-03-03 MED ORDER — RANOLAZINE ER 1000 MG PO TB12: 1000.0000 mg | ORAL_TABLET | Freq: Two times a day (BID) | ORAL | 4 refills | Status: DC

## 2018-03-03 MED ORDER — RANOLAZINE ER 500 MG PO TB12
1000.0000 mg | ORAL_TABLET | Freq: Two times a day (BID) | ORAL | Status: DC
  Administered 2018-03-03: 11:00:00 1000 mg via ORAL
  Filled 2018-03-03: qty 2

## 2018-03-03 NOTE — Telephone Encounter (Signed)
New Message   Patient has a TOC appt scheduled 12/06 with Richardson Dopp.

## 2018-03-03 NOTE — Telephone Encounter (Signed)
Pt being d/c this morning will try pt on 03/04/18 for TOC call.

## 2018-03-03 NOTE — Discharge Summary (Signed)
Discharge Summary    Patient ID: Robert Ramos MRN: [id number]; DOB: [birth year]  Admit date: 03/02/2018 Discharge date: 03/03/2018  Primary Care Provider: [name], MD  Primary Cardiologist: Jenne Campus, MD   Discharge Diagnoses    Principal Problem:   Angina pectoris [facility]) Active Problems:   Coronary artery disease   Dyslipidemia   Mixed hyperlipidemia  Allergies Allergies  Allergen Reactions  . Penicillin G Anaphylaxis, Swelling and Rash    Has patient had a PCN reaction causing immediate rash, facial/tongue/throat swelling, SOB or lightheadedness with hypotension:Yes Has patient had a PCN reaction causing severe rash involving mucus membranes or skin necrosis:Yes--all over body Has patient had a PCN reaction that required hospitalization:No--treated in ER Has patient had a PCN reaction occurring within the last 10 years:No If all of the above answers are "NO", then may proceed with Cephalosporin use.   [name] Zolpidem Other (See Comments)    dizziness   Diagnostic Studies/Procedures    Cardiac catheterization 03/02/18:   Non-stenotic Post Atrio lesion was previously treated.  Mid RCA-2 lesion is 100% stenosed.  Mid RCA to Dist RCA lesion is 100% stenosed.  Post PCI there remains 100% occlusion   1. Unsuccessful CTO PCI of the RCA. Unable to cross lesion with a wire.   Plan: would continue medical therapy. I don't feel further attempts at CTO PCI would be advisable. Will observe overnight for complications and plan DC in am.  Recommend Aspirin 81mg  daily for moderate CAD.  History of Present Illness     Robert Ramos is a 67 y.o. male with a hx of coronary artery disease and chronic exertional angina, who presented to the office for further evaluation of the treatment options for continued angina.  The patient underwent stenting of the LAD in 2018 when he presented with crescendo angina. This was performed under guidance of intravascular  ultrasound.  He returned in May 2019 with worsening anginal symptoms and underwent repeat cardiac catheterization demonstrating progressive and severe stenosis of the posterior lateral branch of the RCA.  He underwent uncomplicated PCI but did not appreciate any improvement in his symptoms of exertional angina.  Relook cardiac catheterization the following week demonstrated stable findings and pressure wire analysis of the LAD was performed.  This was negative.  Ongoing medical therapy was recommended.  The patient reported continued exertional chest discomfort and shortness of breath at low level activity with last office visit with Dr. Burt Knack.  He was noted to be on multidrug antianginal therapy on a combination of ranolazine, amlodipine, metoprolol, and high-dose isosorbide.  He described central chest tightness just with walking to his mailbox. There was associated shortness of breath.  His symptoms resolved after several minutes of rest. He stated that he is not even able to walk at the [employer] grocery store because of chest discomfort.  He has remained on dual antiplatelet therapy with aspirin and clopidogrel.  He reported modest improvement in anginal symptoms since starting amlodipine but continues to exhibit CCS class III symptoms.  Given the above, the patient was scheduled for cardiac catheterization on 03/02/18.   Hospital Course   He was taken to the cath lab on 03/02/18 which revealed non-stenotic post atrio lesion was previously treated, mid RCA-2 lesion which was 100% stenosed, mid RCA to dist RCA lesion which was 100% stenosed with post PCI there remains 100% occlusion. An unsuccessful CTO PCI of the RCA. Unable to cross lesion with a wire. Recommendations were for continued medical therapy.  Per cath report, further attempts at CTO PCI would be advisable.Recommend Aspirin 81mg  daily for moderate CAD.  At day of discharge, creatinine was noted to be 1.25 with a baseline of 1.1.  Will have this rechecked at follow up appointment. Will have patient ambulate prior to discharge. We have increased his Ranexa to 1000mg  twice daily. Consider stopping ACE and increasing amlodipine in the OP setting if BP tolerates.    General: Well developed, well nourished, NAD Skin: Warm, dry, intact  Head: Normocephalic, atraumatic, clear, moist mucus membranes. Neck: Negative for carotid bruits. No JVD Lungs:Clear to ausculation bilaterally. No wheezes, rales, or rhonchi. Breathing is unlabored. Cardiovascular: RRR with S1 S2. No murmurs, rubs, gallops, or LV heave appreciated. Abdomen: Soft, non-tender, non-distended with normoactive bowel sounds. No obvious abdominal masses. MSK: Strength and tone appear normal for age. 5/5 in all extremities Extremities: No edema. No clubbing or cyanosis. DP/PT pulses 2+ bilaterally. Left groin with mild ecchymosis, level 1, right groin level 0 Neuro: Alert and oriented. No focal deficits. No facial asymmetry. MAE spontaneously. Psych: Responds to questions appropriately with normal affect.    Consultants: None    The patient has been seen and examined by Dr. Ellyn Hack who feels that he is stable and ready for discharge today, 03/03/18. Left groin, level 1 with ecchymosis and right groin level 0. VSS. Will follow with close [facility] appointment. Post cath precautions reviewed.   Discharge Vitals Blood pressure 113/69, pulse 85, temperature 98.3 F (36.8 C), temperature source Oral, resp. rate 15, height 5\' 5"  (1.651 m), weight 91 kg, SpO2 95 %.  Filed Weights   03/02/18 0622 03/03/18 0621  Weight: 90.7 kg 91 kg   Labs & Radiologic Studies    CBC Recent Labs    02/28/18 1002 03/03/18 0332  WBC 6.9 7.0  HGB 12.6* 10.2*  HCT 39.0 33.5*  MCV 90 91.8  PLT 271 956   Basic Metabolic Panel Recent Labs    02/28/18 1002 03/03/18 0332  NA 139 135  K 4.7 4.2  CL 99 103  CO2 24 26  GLUCOSE 110* 131*  BUN 10 12  CREATININE 1.29* 1.25*  CALCIUM  9.7 8.6*   Disposition   Pt is being discharged home today in good condition.  Follow-up Plans & Appointments    Follow-up Information    Liliane Shi, PA-C Follow up on 03/18/2018.   Specialties:  Cardiology, Physician Assistant Why:  Your follow up appointment will be on 03/18/18 at 0845am. Please arrive at 0830am.  Contact information: [address] [place] [address] [phone]          Discharge Instructions    Call MD for:  difficulty breathing, headache or visual disturbances   Complete by:  As directed    Call MD for:  extreme fatigue   Complete by:  As directed    Call MD for:  persistant dizziness or light-headedness   Complete by:  As directed    Call MD for:  persistant nausea and vomiting   Complete by:  As directed    Call MD for:  redness, tenderness, or signs of infection (pain, swelling, redness, odor or green/yellow discharge around incision site)   Complete by:  As directed    Call MD for:  severe uncontrolled pain   Complete by:  As directed    Call MD for:  temperature >100.4   Complete by:  As directed    Diet - low sodium heart healthy  Complete by:  As directed    Discharge instructions   Complete by:  As directed    No driving for 3 days. No lifting over 5 lbs for 1 week. No sexual activity for 1 week. Keep procedure site clean & dry. If you notice increased pain, swelling, bleeding or pus, call/return!  You may shower, but no soaking baths/hot tubs/pools for 1 week.   PLEASE DO NOT MISS ANY DOSES OF YOUR PLAVIX!!!!! Also keep a log of you blood pressures and bring back to your follow up appt. Please call the office with any questions.   Patients taking blood thinners should generally stay away from medicines like ibuprofen, Advil, Motrin, naproxen, and Aleve due to risk of stomach bleeding. You may take Tylenol as directed or talk to your primary doctor about alternatives.  Some studies suggest Prilosec/Omeprazole  interacts with Plavix. If you have reflux symptoms, please take Protonix for less chance of interaction.   Increase activity slowly   Complete by:  As directed      Discharge Medications   Allergies as of 03/03/2018      Reactions   Penicillin G Anaphylaxis, Swelling, Rash   Has patient had a PCN reaction causing immediate rash, facial/tongue/throat swelling, SOB or lightheadedness with hypotension:Yes Has patient had a PCN reaction causing severe rash involving mucus membranes or skin necrosis:Yes--all over body Has patient had a PCN reaction that required hospitalization:No--treated in ER Has patient had a PCN reaction occurring within the last 10 years:No If all of the above answers are "NO", then may proceed with Cephalosporin use.   Zolpidem Other (See Comments)   dizziness      Medication List    TAKE these medications   allopurinol 100 MG tablet Commonly known as:  ZYLOPRIM Take 100 mg by mouth every other day.   amLODipine 5 MG tablet Commonly known as:  NORVASC Take 1 tablet (5 mg total) by mouth daily.   aspirin EC 81 MG tablet Take 1 tablet (81 mg total) by mouth daily.   clopidogrel 75 MG tablet Commonly known as:  PLAVIX Take 1 tablet (75 mg total) by mouth daily with breakfast.   Icosapent Ethyl 1 g Caps Take 2 capsules (2 g total) by mouth 2 times daily at 12 noon and 4 pm.   isosorbide mononitrate 120 MG 24 hr tablet Commonly known as:  IMDUR Take 1 tablet (120 mg total) by mouth daily.   lisinopril 2.5 MG tablet Commonly known as:  PRINIVIL,ZESTRIL Take 1 tablet (2.5 mg total) by mouth daily.   metoprolol tartrate 50 MG tablet Commonly known as:  LOPRESSOR Take 1 tablet (50 mg total) by mouth 2 (two) times daily.   nitroGLYCERIN 0.4 MG SL tablet Commonly known as:  NITROSTAT Place 1 tablet (0.4 mg total) under the tongue every 5 (five) minutes as needed for chest pain.   oxyCODONE 5 MG immediate release tablet Commonly known as:  Oxy  IR/ROXICODONE Take 5 mg by mouth daily as needed (for severe back pain.).   ranolazine 1000 MG SR tablet Commonly known as:  RANEXA Take 1 tablet (1,000 mg total) by mouth 2 (two) times daily. What changed:    medication strength  how much to take   rosuvastatin 40 MG tablet Commonly known as:  CRESTOR Take 40 mg by mouth daily.   TYLENOL 8 HOUR 650 MG CR tablet Generic drug:  acetaminophen Take 650 mg by mouth every 8 (eight) hours as needed for pain.  Vitamin D (Ergocalciferol) 1.25 MG (50000 UT) Caps capsule Commonly known as:  DRISDOL Take 50,000 Units by mouth every Sunday.        Acute coronary syndrome (MI, NSTEMI, STEMI, etc) this admission?: No.    Outstanding Labs/Studies   Will need BMET at follow up appointment   Duration of Discharge Encounter   Greater than 30 minutes including physician time.  Signed, Kathyrn Drown, NP 03/03/2018, 8:11 AM

## 2018-03-04 LAB — POCT ACTIVATED CLOTTING TIME: Activated Clotting Time: 296 seconds

## 2018-03-04 MED FILL — Nitroglycerin IV Soln 100 MCG/ML in D5W: INTRA_ARTERIAL | Qty: 10 | Status: AC

## 2018-03-04 NOTE — Telephone Encounter (Signed)
**Note De-Identified Vernessa Likes Obfuscation** Patient contacted regarding discharge from [facility] on 03/03/18.  Patient understands to follow up with provider Richardson Dopp, PA-c on 03/18/18 at 8:45 at [facility] in [place]. Patient understands discharge instructions? Yes Patient understands medications and regiment? Yes Patient understands to bring all medications to this visit? Yes

## 2018-03-18 ENCOUNTER — Encounter: Payer: Self-pay | Admitting: Physician Assistant

## 2018-03-18 ENCOUNTER — Ambulatory Visit (INDEPENDENT_AMBULATORY_CARE_PROVIDER_SITE_OTHER): Payer: PPO | Admitting: Physician Assistant

## 2018-03-18 VITALS — BP 106/80 | HR 67 | Ht 65.0 in | Wt 199.8 lb

## 2018-03-18 DIAGNOSIS — I25118 Atherosclerotic heart disease of native coronary artery with other forms of angina pectoris: Secondary | ICD-10-CM

## 2018-03-18 DIAGNOSIS — R002 Palpitations: Secondary | ICD-10-CM | POA: Diagnosis not present

## 2018-03-18 DIAGNOSIS — M549 Dorsalgia, unspecified: Secondary | ICD-10-CM | POA: Diagnosis not present

## 2018-03-18 DIAGNOSIS — E782 Mixed hyperlipidemia: Secondary | ICD-10-CM | POA: Diagnosis not present

## 2018-03-18 MED ORDER — METOPROLOL TARTRATE 50 MG PO TABS: 75.0000 mg | ORAL_TABLET | Freq: Two times a day (BID) | ORAL | 3 refills | Status: DC

## 2018-03-18 NOTE — Patient Instructions (Signed)
Medication Instructions:  Your physician has recommended you make the following change in your medication:  1. INCREASE METOPROLOL 75 MG TWICE DAILY.  If you need a refill on your cardiac medications before your next appointment, please call your pharmacy.   Lab work: NONE If you have labs (blood work) drawn today and your tests are completely normal, you will receive your results only by: [name] MyChart Message (if you have MyChart) OR . A paper copy in the mail If you have any lab test that is abnormal or we need to change your treatment, we will call you to review the results.  Testing/Procedures: Your physician has recommended that you wear a 30 DAY holter monitor. Holter monitors are medical devices that record the heart's electrical activity. Doctors most often use these monitors to diagnose arrhythmias. Arrhythmias are problems with the speed or rhythm of the heartbeat. The monitor is a small, portable device. You can wear one while you do your normal daily activities. This is usually used to diagnose what is causing palpitations/syncope (passing out).    Follow-Up: At [facility], you and your health needs are our priority.  As part of our continuing mission to provide you with exceptional heart care, we have created designated Provider Care Teams.  These Care Teams include your primary Cardiologist (physician) and Advanced Practice Providers (APPs -  Physician Assistants and Nurse Practitioners) who all work together to provide you with the care you need, when you need it. .  You will need a follow up appointment in 6-8 weeks.  Please call our office 2 months in advance to schedule this appointment.  You may see Jenne Campus, MDor another member of our [place] Provider Team in [place]: Shirlee More, MD . Jyl Heinz, MD  Any Other Special Instructions Will Be Listed Below (If Applicable).

## 2018-03-18 NOTE — Progress Notes (Signed)
Cardiology Office Note:    Date:  03/18/2018   ID:  Robert Ramos, DOB Feb 12, 1951, MRN [id number]  PCP:  Robert Ramos., MD  Cardiologist:  Robert Campus, MD  Electrophysiologist:  None   Referring MD: Robert Ramos., MD   Chief Complaint  Patient presents with  . Hospitalization Follow-up    s/p cardiac cath    History of Present Illness:    Robert Ramos is a 67 y.o. male with coronary artery disease status post PCI of the LAD in 2018, status post PCI of the posterior lateral branch of the RCA in May 2019, hypertension, hyperlipidemia.  He has had continued symptoms of exertional chest discomfort and shortness of breath at low level activity despite treatment with multidrug antianginal therapy.  Recent stress testing was abnormal.  He was referred to Dr. Burt Ramos for further evaluation.  Cardiac catheterization demonstrated moderate LAD stenosis unchanged from previous study, patent LCx with small vessel disease involving the OM branches not amenable to PCI and interval occlusion of the mid RCA with left-to-right collaterals.  Patient was referred to Dr. Martinique for possible CTO PCI of the RCA.  He underwent attempted CTO PCI of the RCA but this was unsuccessful.  Continued medical therapy was recommended.  He was observed overnight without complications.  Ranolazine dose was increased.  It was felt that consideration could be given to stopping his ACE inhibitor and increasing his amlodipine if needed.    Robert Ramos returns for follow-up.  He is here with his wife.  He continues to have episodes of exertional angina and shortness of breath.  He also has occasional episodes of awakening with chest pain and rapid palpitations.  Overall, since starting on antianginal therapy, the frequency and intensity of his symptoms have improved.  He has chronic back issues.  He is interested in getting epidural steroid injections.  He has not had any significant pain at his bilateral femoral  arteriotomy sites.  Prior CV studies:   The following studies were reviewed today:  Attempted CTO PCI 03/02/2018 1. Unsuccessful CTO PCI of the RCA. Unable to cross lesion with a wire.  Plan: would continue medical therapy. I don't feel further attempts at CTO PCI would be advisable. Will observe overnight for complications and plan DC in am. Recommend Aspirin 81mg  daily for moderate CAD     Cardiac catheterization 02/21/2018 LAD proximal stent patent, mid 13; first septal branch ostial 54 RI ostial 50 LCx proximal 30, mid 30; OM 290 RCA mid 40 then stent 100 ISR; RPAVB stent patent RPDA filled by collaterals from first and second septal perforators    Nuclear stress test 01/11/2018 Myocardial perfusion is abnormal. Large fixed defect of the infero and infero lateral wall with minimal reversibility. Normal wall motion on gated imaging. This is an intermediate risk study. Overall left ventricular systolic function was normal. LV cavity size is normal. Nuclear stress EF: 54%. The left ventricular ejection fraction is mildly decreased (45-54%). There is no prior study for comparison.  Echo 09/10/2017 Inferobasal hypokinesis, EF 55, normal wall motion, normal diastolic function, mild AI, PASP 31  Past Medical History:  Diagnosis Date  . Arthritis    "hands, back" (03/02/2018)  . Chronic radicular low back pain   . Coronary artery disease    a. MI in 2002 s/p stenting to mRCA b.cath 11/2016 s/p DES to mLAD c. Cath 08/2017- patent LAD stent, 40% instent restenosis of mRCA, 99% posterolateral artery s/p PTCA & DES.  [name]  Gout    "on RX qod" (03/02/2018)  . Heart attack ([place]) 2002  . History of kidney stones   . Hyperlipidemia   . Hypertension    Surgical Hx: The patient  has a past surgical history that includes Back surgery; LEFT HEART CATH AND CORONARY ANGIOGRAPHY (N/A, 11/20/2016); CORONARY STENT INTERVENTION (N/A, 11/20/2016); Intravascular Ultrasound/IVUS (N/A, 11/20/2016);  Tonsillectomy; Hemi-microdiscectomy lumbar laminectomy level 1 (Left, 2008); Coronary angioplasty with stent (2002; 11/20/2016); Appendectomy (1974); LEFT HEART CATH AND CORONARY ANGIOGRAPHY (N/A, 09/09/2017); CORONARY STENT INTERVENTION (N/A, 09/09/2017); LEFT HEART CATH AND CORONARY ANGIOGRAPHY (N/A, 09/16/2017); INTRAVASCULAR PRESSURE WIRE/FFR STUDY (N/A, 09/16/2017); LEFT HEART CATH AND CORONARY ANGIOGRAPHY (N/A, 02/21/2018); CORONARY CTO INTERVENTION (N/A, 03/02/2018); Ultrasound guidance for vascular access (03/02/2018); and Knee arthroscopy (Left, 1983).   Current Medications: Current Meds  Medication Sig  . acetaminophen (TYLENOL 8 HOUR) 650 MG CR tablet Take 650 mg by mouth every 8 (eight) hours as needed for pain.  [name] allopurinol (ZYLOPRIM) 100 MG tablet Take 100 mg by mouth every other day.   [name] amLODipine (NORVASC) 5 MG tablet Take 1 tablet (5 mg total) by mouth daily.  [name] aspirin EC 81 MG tablet Take 1 tablet (81 mg total) by mouth daily.  . clopidogrel (PLAVIX) 75 MG tablet Take 1 tablet (75 mg total) by mouth daily with breakfast.  . Icosapent Ethyl (VASCEPA) 1 g CAPS Take 2 capsules (2 g total) by mouth 2 times daily at 12 noon and 4 pm.  . isosorbide mononitrate (IMDUR) 120 MG 24 hr tablet Take 1 tablet (120 mg total) by mouth daily.  [name] lisinopril (PRINIVIL,ZESTRIL) 2.5 MG tablet Take 1 tablet (2.5 mg total) by mouth daily.  [name] oxyCODONE (OXY IR/ROXICODONE) 5 MG immediate release tablet Take 5 mg by mouth daily as needed (for severe back pain.).  [name] ranolazine (RANEXA) 1000 MG SR tablet Take 1 tablet (1,000 mg total) by mouth 2 (two) times daily.  . rosuvastatin (CRESTOR) 40 MG tablet Take 40 mg by mouth daily.  . Vitamin D, Ergocalciferol, (DRISDOL) 50000 units CAPS capsule Take 50,000 Units by mouth every Sunday.   . [DISCONTINUED] metoprolol tartrate (LOPRESSOR) 50 MG tablet Take 1 tablet (50 mg total) by mouth 2 (two) times daily.     Allergies:   Penicillin g and Zolpidem   Social  History   Tobacco Use  . Smoking status: Former Smoker    Packs/day: 1.00    Years: 35.00    Pack years: 35.00    Types: Cigarettes    Last attempt to quit: 02/06/2001    Years since quitting: 17.1  . Smokeless tobacco: Never Used  Substance Use Topics  . Alcohol use: Yes    Alcohol/week: 8.0 standard drinks    Types: 8 Cans of beer per week  . Drug use: Never     Family Hx: The patient's family history includes Cancer in his father.  ROS:   Please see the history of present illness.    ROS All other systems reviewed and are negative.   EKGs/Labs/Other Test Reviewed:    EKG:  EKG is  ordered today.  The ekg ordered today demonstrates normal sinus rhythm, rate 67, normal axis, QTC 441  Recent Labs: 04/30/2017: ALT 21 09/08/2017: Magnesium 2.0; TSH 1.305 03/03/2018: BUN 12; Creatinine, Ser 1.25; Hemoglobin 10.2; Platelets 211; Potassium 4.2; Sodium 135   Recent Lipid Panel Lab Results  Component Value Date/Time   CHOL 129 09/09/2017 03:52 AM   CHOL 116 04/30/2017 08:20 AM  TRIG 373 (H) 09/09/2017 03:52 AM   HDL 30 (L) 09/09/2017 03:52 AM   HDL 33 (L) 04/30/2017 08:20 AM   CHOLHDL 4.3 09/09/2017 03:52 AM   LDLCALC 24 09/09/2017 03:52 AM   LDLCALC 54 04/30/2017 08:20 AM    Physical Exam:    VS:  BP 106/80   Pulse 67   Ht 5\' 5"  (1.651 m)   Wt 199 lb 12.8 oz (90.6 kg)   BMI 33.25 kg/m     Wt Readings from Last 3 Encounters:  03/18/18 199 lb 12.8 oz (90.6 kg)  03/03/18 200 lb 9.9 oz (91 kg)  02/21/18 201 lb (91.2 kg)     Physical Exam  Constitutional: He is oriented to person, place, and time. He appears well-developed and well-nourished. No distress.  HENT:  Head: Normocephalic and atraumatic.  Eyes: No scleral icterus.  Neck: No JVD present. No thyromegaly present.  Cardiovascular: Normal rate and regular rhythm.  No murmur heard. Pulmonary/Chest: Effort normal. He has no rales.  Abdominal: Soft. He exhibits no distension.  Musculoskeletal: He  exhibits no edema.  bilat FA sites without hematoma or bruit  Lymphadenopathy:    He has no cervical adenopathy.  Neurological: He is alert and oriented to person, place, and time.  Skin: Skin is warm and dry.    ASSESSMENT & PLAN:    Coronary artery disease of native artery of native heart with stable angina pectoris ([place])  Status post prior PCI of the LAD and posterior lateral branch.  He has a chronically occluded RCA.  He has chronic angina.  Recent CTO PCI attempt was unsuccessful.  He continues to have anginal symptoms with exertion.  He also has episodes of rapid palpitations awakening him from sleep.  This also exacerbates his angina.  I have recommended increasing his beta-blocker therapy for now.  If this is not helpful, we could discontinue his lisinopril and increase his amlodipine.  -Continue current dose of amlodipine, aspirin, clopidogrel, isosorbide, ranolazine, rosuvastatin  -Increase metoprolol tartrate to 75 mg twice daily  Mixed hyperlipidemia Continue statin therapy  Palpitations He has occasional episodes of rapid palpitations that awaken him from sleep.  This exacerbates his anginal symptoms.  I will increase his metoprolol as noted.  Arrange 30-day event monitor.  Arrange follow-up with Dr. Agustin Cree in [place] in 6-8 weeks.  Back pain He wants to get ESI.  He would have to come off of ASA and Plavix.  His last PCI was in 08/2017.  I will review this with Dr. Burt Ramos and Dr. Agustin Cree.   Dispo:  Return in about 8 weeks (around 05/13/2018) for Follow up after testing w/ Dr. Agustin Cree.   Medication Adjustments/Labs and Tests Ordered: Current medicines are reviewed at length with the patient today.  Concerns regarding medicines are outlined above.  Tests Ordered: Orders Placed This Encounter  Procedures  . CARDIAC EVENT MONITOR  . EKG 12-Lead   Medication Changes: Meds ordered this encounter  Medications  . DISCONTD: metoprolol tartrate (LOPRESSOR) 50 MG  tablet    Sig: Take 1.5 tablets (75 mg total) by mouth 2 (two) times daily.    Dispense:  180 tablet    Refill:  3  . metoprolol tartrate (LOPRESSOR) 50 MG tablet    Sig: Take 1.5 tablets (75 mg total) by mouth 2 (two) times daily.    Dispense:  270 tablet    Refill:  3    Signed, Richardson Dopp, PA-C  03/18/2018 11:36 AM    [facility] [place], [place], [place]  [address] Phone: [phone]; Fax: [phone]

## 2018-03-21 ENCOUNTER — Telehealth: Payer: Self-pay | Admitting: Physician Assistant

## 2018-03-21 NOTE — Progress Notes (Signed)
That should be fine Scott. thanks

## 2018-03-21 NOTE — Telephone Encounter (Signed)
Please tell patient I reviewed his case with Dr. Burt Knack.  He felt that it is ok to hold ASA and Plavix for an epidural steroid injection. Once he gets the procedure scheduled, he will hold ASA and Plavix for 7 days prior and resume it ASAP after the procedure.  The surgeon will let him know when he can resume it. Richardson Dopp, PA-C    03/21/2018 5:19 PM

## 2018-03-21 NOTE — Telephone Encounter (Signed)
-----   Message from Sherren Mocha, MD sent at 03/21/2018  1:15 PM EST -----   ----- Message ----- From: Sharmon Revere Sent: 03/18/2018   9:16 AM EST To: Sherren Mocha, MD  Dellis Anes - He wants to get [facility] for his back.  Last PCI in 5/19.  [facility] noted Plavix could be stopped after 3 mos if needed for back surgery.  I told him I would check with you to see if he can hold both ASA and Plavix now for ESI. Thanks [employer]

## 2018-03-22 NOTE — Telephone Encounter (Signed)
LDM for pt (DPR on file) about recommendations per Richardson Dopp, PA-C.

## 2018-03-24 DIAGNOSIS — S90122A Contusion of left lesser toe(s) without damage to nail, initial encounter: Secondary | ICD-10-CM | POA: Diagnosis not present

## 2018-03-29 ENCOUNTER — Ambulatory Visit (INDEPENDENT_AMBULATORY_CARE_PROVIDER_SITE_OTHER): Payer: PPO

## 2018-03-29 DIAGNOSIS — R002 Palpitations: Secondary | ICD-10-CM

## 2018-03-30 ENCOUNTER — Other Ambulatory Visit: Payer: Self-pay | Admitting: Cardiology

## 2018-05-04 ENCOUNTER — Telehealth: Payer: Self-pay | Admitting: Emergency Medicine

## 2018-05-04 NOTE — Telephone Encounter (Signed)
Left message for patient to return call regarding monitor results.  

## 2018-05-06 NOTE — Telephone Encounter (Signed)
Left results of monitor on patient's voicemail per dp. Advised patient to call back with any questions

## 2018-05-17 ENCOUNTER — Encounter: Payer: Self-pay | Admitting: Cardiology

## 2018-05-17 ENCOUNTER — Ambulatory Visit (INDEPENDENT_AMBULATORY_CARE_PROVIDER_SITE_OTHER): Payer: PPO | Admitting: Cardiology

## 2018-05-17 VITALS — BP 110/74 | HR 68 | Wt 200.8 lb

## 2018-05-17 DIAGNOSIS — I25118 Atherosclerotic heart disease of native coronary artery with other forms of angina pectoris: Secondary | ICD-10-CM | POA: Diagnosis not present

## 2018-05-17 DIAGNOSIS — E782 Mixed hyperlipidemia: Secondary | ICD-10-CM | POA: Diagnosis not present

## 2018-05-17 DIAGNOSIS — R072 Precordial pain: Secondary | ICD-10-CM | POA: Diagnosis not present

## 2018-05-17 LAB — LIPID PANEL
CHOL/HDL RATIO: 3.2 ratio (ref 0.0–5.0)
Cholesterol, Total: 131 mg/dL (ref 100–199)
HDL: 41 mg/dL (ref >39)
LDL Calculated: 33 mg/dL (ref 0–99)
TRIGLYCERIDES: 285 mg/dL — AB (ref 0–149)
VLDL Cholesterol Cal: 57 mg/dL — ABNORMAL HIGH (ref 5–40)

## 2018-05-17 LAB — HEPATIC FUNCTION PANEL
ALT: 17 IU/L (ref 0–44)
AST: 17 IU/L (ref 0–40)
Albumin: 4.3 g/dL (ref 3.8–4.8)
Alkaline Phosphatase: 69 IU/L (ref 39–117)
Bilirubin Total: 0.3 mg/dL (ref 0.0–1.2)
Bilirubin, Direct: 0.12 mg/dL (ref 0.00–0.40)
Total Protein: 7.2 g/dL (ref 6.0–8.5)

## 2018-05-17 NOTE — Progress Notes (Signed)
Cardiology Office Note:    Date:  05/17/2018   ID:  Audrie Lia Holzmann, DOB [birth year], MRN [id number]  PCP:  [name], MD  Cardiologist:  Jenne Campus, MD    Referring MD: [name], MD   No chief complaint on file. Doing well   History of Present Illness:    Arun Herrod Risko is a 68 y.o. male   with coronary artery disease status post PCI of the LAD in 2018, status post PCI of the posterior lateral branch of the RCA in May 2019, hypertension, hyperlipidemia.  He has had continued symptoms of exertional chest discomfort and shortness of breath at low level activity despite treatment with multidrug antianginal therapy.  Recent stress testing was abnormal.  He was referred to Dr. Burt Knack for further evaluation.  Cardiac catheterization demonstrated moderate LAD stenosis unchanged from previous study, patent LCx with small vessel disease involving the OM branches not amenable to PCI and interval occlusion of the mid RCA with left-to-right collaterals.  Patient was referred to Dr. Martinique for possible CTO PCI of the RCA.  He underwent attempted CTO PCI of the RCA but this was unsuccessful.  Continued medical therapy was recommended.  He was observed overnight without complications.  Ranolazine dose was increased.  It was felt that consideration could be given to stopping his ACE inhibitor and increasing his amlodipine if needed.    He comes today for follow-up doing very well actually he said he is feeling better he is able to do more and walks with a dog on a regular basis he described 2 episodes of pain within last 2 months one episode was when he was carrying heavy load of wood today.  He had to take nitroglycerin with good relief and very pleased where he feels I think which the point then does not much mechanically we can do about his right coronary artery but he can exercise it better and make the situation better and that is what he does.  Prior CV studies:   The following  studies were reviewed today:  Attempted CTO PCI 03/02/2018 1. Unsuccessful CTO PCI of the RCA. Unable to cross lesion with a wire.  Plan: would continue medical therapy. I don't feel further attempts at CTO PCI would be advisable. Will observe overnight for complications and plan DC in am. Recommend Aspirin 81mg  daily for moderate CAD     Cardiac catheterization 02/21/2018 LAD proximal stent patent, mid 51; first septal branch ostial 52 RI ostial 50 LCx proximal 30, mid 30; OM 290 RCA mid 40 then stent 100 ISR; RPAVB stent patent RPDA filled by collaterals from first and second septal perforators    Nuclear stress test 01/11/2018 Myocardial perfusion is abnormal. Large fixed defect of the infero and infero lateral wall with minimal reversibility. Normal wall motion on gated imaging. This is an intermediate risk study. Overall left ventricular systolic function was normal. LV cavity size is normal. Nuclear stress EF: 54%. The left ventricular ejection fraction is mildly decreased (45-54%). There is no prior study for comparison.  Echo 09/10/2017 Inferobasal hypokinesis, EF 55, normal wall motion, normal diastolic function, mild AI, PASP 31   Past Medical History:  Diagnosis Date  . Arthritis    "hands, back" (03/02/2018)  . Chronic radicular low back pain   . Coronary artery disease    a. MI in 2002 s/p stenting to mRCA b.cath 11/2016 s/p DES to mLAD c. Cath 08/2017- patent LAD stent, 40% instent restenosis of mRCA,  99% posterolateral artery s/p PTCA & DES.  . Gout    "on RX qod" (03/02/2018)  . Heart attack ([place]) 2002  . History of kidney stones   . Hyperlipidemia   . Hypertension     Past Surgical History:  Procedure Laterality Date  . APPENDECTOMY  1974  . BACK SURGERY    . CORONARY ANGIOPLASTY WITH STENT PLACEMENT  2002; 11/20/2016   "@ [place]; @ [facility]"  . CORONARY CTO INTERVENTION N/A 03/02/2018   Procedure: CORONARY CTO INTERVENTION - Right;  Surgeon:  Martinique, Peter M, MD;  Location: [place] [facility];  Service: Cardiovascular;  Laterality: N/A;  . CORONARY STENT INTERVENTION N/A 11/20/2016   Procedure: CORONARY STENT INTERVENTION;  Surgeon: Nelva Bush, MD;  Location: [facility] [facility];  Service: Cardiovascular;  Laterality: N/A;  . CORONARY STENT INTERVENTION N/A 09/09/2017   Procedure: CORONARY STENT INTERVENTION;  Surgeon: Burnell Blanks, MD;  Location: [place] [facility];  Service: Cardiovascular;  Laterality: N/A;  . HEMI-MICRODISCECTOMY LUMBAR LAMINECTOMY LEVEL 1 Left 2008   L4  . INTRAVASCULAR PRESSURE WIRE/FFR STUDY N/A 09/16/2017   Procedure: INTRAVASCULAR PRESSURE WIRE/FFR STUDY;  Surgeon: Belva Crome, MD;  Location: [place] [facility];  Service: Cardiovascular;  Laterality: N/A;  . INTRAVASCULAR ULTRASOUND/IVUS N/A 11/20/2016   Procedure: Intravascular Ultrasound/IVUS;  Surgeon: Nelva Bush, MD;  Location: [place] [facility];  Service: Cardiovascular;  Laterality: N/A;  . KNEE ARTHROSCOPY Left 1983  . LEFT HEART CATH AND CORONARY ANGIOGRAPHY N/A 11/20/2016   Procedure: LEFT HEART CATH AND CORONARY ANGIOGRAPHY;  Surgeon: Nelva Bush, MD;  Location: [place] [facility];  Service: Cardiovascular;  Laterality: N/A;  . LEFT HEART CATH AND CORONARY ANGIOGRAPHY N/A 09/09/2017   Procedure: LEFT HEART CATH AND CORONARY ANGIOGRAPHY;  Surgeon: Burnell Blanks, MD;  Location: [place] [facility];  Service: Cardiovascular;  Laterality: N/A;  . LEFT HEART CATH AND CORONARY ANGIOGRAPHY N/A 09/16/2017   Procedure: LEFT HEART CATH AND CORONARY ANGIOGRAPHY;  Surgeon: Belva Crome, MD;  Location: [place] [facility];  Service: Cardiovascular;  Laterality: N/A;  . LEFT HEART CATH AND CORONARY ANGIOGRAPHY N/A 02/21/2018   Procedure: LEFT HEART CATH AND CORONARY ANGIOGRAPHY;  Surgeon: Sherren Mocha, MD;  Location: [place] [facility];  Service: Cardiovascular;  Laterality: N/A;  . TONSILLECTOMY    . ULTRASOUND GUIDANCE FOR  VASCULAR ACCESS  03/02/2018   Procedure: Ultrasound Guidance For Vascular Access;  Surgeon: Martinique, Peter M, MD;  Location: [place] [facility];  Service: Cardiovascular;;    Current Medications: No outpatient medications have been marked as taking for the 05/17/18 encounter (Appointment) with Park Liter, MD.     Allergies:   Penicillin g and Zolpidem   Social History   Socioeconomic History  . Marital status: Married    Spouse name: Not on file  . Number of children: Not on file  . Years of education: Not on file  . Highest education level: Not on file  Occupational History  . Not on file  Social Needs  . Financial resource strain: Not on file  . Food insecurity:    Worry: Not on file    Inability: Not on file  . Transportation needs:    Medical: Not on file    Non-medical: Not on file  Tobacco Use  . Smoking status: Former Smoker    Packs/day: 1.00    Years: 35.00    Pack years: 35.00    Types: Cigarettes  Last attempt to quit: 02/06/2001    Years since quitting: 17.2  . Smokeless tobacco: Never Used  Substance and Sexual Activity  . Alcohol use: Yes    Alcohol/week: 8.0 standard drinks    Types: 8 Cans of beer per week  . Drug use: Never  . Sexual activity: Not Currently  Lifestyle  . Physical activity:    Days per week: Not on file    Minutes per session: Not on file  . Stress: Not on file  Relationships  . Social connections:    Talks on phone: Not on file    Gets together: Not on file    Attends religious service: Not on file    Active member of club or organization: Not on file    Attends meetings of clubs or organizations: Not on file    Relationship status: Not on file  Other Topics Concern  . Not on file  Social History Narrative  . Not on file     Family History: The patient's family history includes Cancer in his father. ROS:   Please see the history of present illness.    All 14 point review of systems negative except as described  per history of present illness  EKGs/Labs/Other Studies Reviewed:      Recent Labs: 09/08/2017: Magnesium 2.0; TSH 1.305 03/03/2018: BUN 12; Creatinine, Ser 1.25; Hemoglobin 10.2; Platelets 211; Potassium 4.2; Sodium 135  Recent Lipid Panel    Component Value Date/Time   CHOL 129 09/09/2017 0352   CHOL 116 04/30/2017 0820   TRIG 373 (H) 09/09/2017 0352   HDL 30 (L) 09/09/2017 0352   HDL 33 (L) 04/30/2017 0820   CHOLHDL 4.3 09/09/2017 0352   VLDL 75 (H) 09/09/2017 0352   LDLCALC 24 09/09/2017 0352   LDLCALC 54 04/30/2017 0820    Physical Exam:    VS:  There were no vitals taken for this visit.    Wt Readings from Last 3 Encounters:  03/18/18 199 lb 12.8 oz (90.6 kg)  03/03/18 200 lb 9.9 oz (91 kg)  02/21/18 201 lb (91.2 kg)     GEN:  Well nourished, well developed in no acute distress HEENT: Normal NECK: No JVD; No carotid bruits LYMPHATICS: No lymphadenopathy CARDIAC: RRR, no murmurs, no rubs, no gallops RESPIRATORY:  Clear to auscultation without rales, wheezing or rhonchi  ABDOMEN: Soft, non-tender, non-distended MUSCULOSKELETAL:  No edema; No deformity  SKIN: Warm and dry LOWER EXTREMITIES: no swelling NEUROLOGIC:  Alert and oriented x 3 PSYCHIATRIC:  Normal affect   ASSESSMENT:    1. Coronary artery disease of native artery of native heart with stable angina pectoris ([place])   2. Precordial chest pain   3. Mixed hyperlipidemia    PLAN:    In order of problems listed above:  1. Coronary artery disease improved on appropriate medications which I will continue. 2. Dyslipidemia we will check his fasting lipid profile today. 3. Palpitations denies having any he wear Holter monitor which did not show any significant arrhythmia.  He did have a sleep study which was done in Dr. Daiva Huge office however there was some mixup he was given wrong results of the test he was told that he needs CPAP mask but he does not use it.  We talked about potentially redoing sleep  study he is not interested at the same time he is talking about losing weight which should help and hopefully by doing that his sleep apnea if he truly have 1 will improve.  Denies having any palpitations now.  Overall he is improving and I am pleased with that I see him back in about 4 months.   Medication Adjustments/Labs and Tests Ordered: Current medicines are reviewed at length with the patient today.  Concerns regarding medicines are outlined above.  No orders of the defined types were placed in this encounter.  Medication changes: No orders of the defined types were placed in this encounter.   Signed, Park Liter, MD, [facility] 05/17/2018 9:20 AM    [place]

## 2018-05-17 NOTE — Patient Instructions (Signed)
Medication Instructions:  Your physician recommends that you continue on your current medications as directed. Please refer to the Current Medication list given to you today.  If you need a refill on your cardiac medications before your next appointment, please call your pharmacy.   Lab work: Your physician recommends that you return for lab work today: Lft, lipids  If you have labs (blood work) drawn today and your tests are completely normal, you will receive your results only by: [name] MyChart Message (if you have MyChart) OR . A paper copy in the mail If you have any lab test that is abnormal or we need to change your treatment, we will call you to review the results.  Testing/Procedures: None.   Follow-Up: At [facility], you and your health needs are our priority.  As part of our continuing mission to provide you with exceptional heart care, we have created designated Provider Care Teams.  These Care Teams include your primary Cardiologist (physician) and Advanced Practice Providers (APPs -  Physician Assistants and Nurse Practitioners) who all work together to provide you with the care you need, when you need it. You will need a follow up appointment in 4 months.  Please call our office 2 months in advance to schedule this appointment.  You may see Jenne Campus, MD or another member of our [place] Provider Team in [place]: Shirlee More, MD . Jyl Heinz, MD  Any Other Special Instructions Will Be Listed Below (If Applicable).

## 2018-06-13 ENCOUNTER — Ambulatory Visit: Payer: PPO | Admitting: Cardiovascular Disease

## 2018-06-14 ENCOUNTER — Telehealth: Payer: Self-pay

## 2018-06-14 NOTE — Telephone Encounter (Signed)
Spoke with the Nurse of Dr. Maryjean Ka about patient's upcoming procedure and advised that he needed to wait to have it unless this is urgent. I discussed this with Dr. Agustin Cree and he stated that the patient needed to wait at least 6 months after his Cardiac Cath which was February 21, 2018. Dr. Maryjean Ka' nurse agreed that it would be better to reschedule to May to be able to hold the patient's Plavix and at that time we would be able to decide to clear him.

## 2018-06-15 ENCOUNTER — Other Ambulatory Visit: Payer: Self-pay | Admitting: Cardiology

## 2018-06-17 ENCOUNTER — Other Ambulatory Visit: Payer: Self-pay | Admitting: Cardiology

## 2018-07-04 DIAGNOSIS — I2510 Atherosclerotic heart disease of native coronary artery without angina pectoris: Secondary | ICD-10-CM | POA: Diagnosis not present

## 2018-07-04 DIAGNOSIS — I10 Essential (primary) hypertension: Secondary | ICD-10-CM | POA: Diagnosis not present

## 2018-07-04 DIAGNOSIS — J3089 Other allergic rhinitis: Secondary | ICD-10-CM

## 2018-07-04 DIAGNOSIS — J029 Acute pharyngitis, unspecified: Secondary | ICD-10-CM | POA: Diagnosis not present

## 2018-07-04 HISTORY — DX: Other allergic rhinitis: J30.89

## 2018-08-26 ENCOUNTER — Other Ambulatory Visit: Payer: Self-pay | Admitting: Cardiology

## 2018-09-12 ENCOUNTER — Other Ambulatory Visit: Payer: Self-pay | Admitting: Cardiology

## 2018-09-30 ENCOUNTER — Other Ambulatory Visit: Payer: Self-pay | Admitting: Cardiology

## 2018-10-05 ENCOUNTER — Encounter: Payer: Self-pay | Admitting: Cardiology

## 2018-10-05 ENCOUNTER — Other Ambulatory Visit: Payer: Self-pay

## 2018-10-05 ENCOUNTER — Ambulatory Visit (INDEPENDENT_AMBULATORY_CARE_PROVIDER_SITE_OTHER): Payer: PPO | Admitting: Cardiology

## 2018-10-05 VITALS — BP 130/64 | HR 80 | Ht 65.0 in | Wt 203.0 lb

## 2018-10-05 DIAGNOSIS — E785 Hyperlipidemia, unspecified: Secondary | ICD-10-CM | POA: Diagnosis not present

## 2018-10-05 DIAGNOSIS — I25118 Atherosclerotic heart disease of native coronary artery with other forms of angina pectoris: Secondary | ICD-10-CM | POA: Diagnosis not present

## 2018-10-05 DIAGNOSIS — G4733 Obstructive sleep apnea (adult) (pediatric): Secondary | ICD-10-CM

## 2018-10-05 DIAGNOSIS — R072 Precordial pain: Secondary | ICD-10-CM

## 2018-10-05 NOTE — Patient Instructions (Signed)
Medication Instructions:  .isntcur  If you need a refill on your cardiac medications before your next appointment, please call your pharmacy.   Lab work: Your physician recommends that you return for lab work In 1 weeK: fasting lipid profile   If you have labs (blood work) drawn today and your tests are completely normal, you will receive your results only by: [name] MyChart Message (if you have MyChart) OR . A paper copy in the mail If you have any lab test that is abnormal or we need to change your treatment, we will call you to review the results.  Testing/Procedures: None.   Follow-Up: At [facility], you and your health needs are our priority.  As part of our continuing mission to provide you with exceptional heart care, we have created designated Provider Care Teams.  These Care Teams include your primary Cardiologist (physician) and Advanced Practice Providers (APPs -  Physician Assistants and Nurse Practitioners) who all work together to provide you with the care you need, when you need it. You will need a follow up appointment in 4 months.  Please call our office 2 months in advance to schedule this appointment.  You may see Jenne Campus, MD or another member of our [place] Provider Team in [place]: Shirlee More, MD . Jyl Heinz, MD  Any Other Special Instructions Will Be Listed Below (If Applicable).

## 2018-10-05 NOTE — Progress Notes (Signed)
Cardiology Office Note:    Date:  10/05/2018   ID:  Robert Ramos, DOB 1950/08/22, MRN [id number]  PCP:  [name], MD  Cardiologist:  Jenne Campus, MD    Referring MD: [name], MD   Chief Complaint  Patient presents with  . Dizziness  doing well  History of Present Illness:    Robert Ramos is a 68 y.o. male  Robert Ramos is a 68 y.o. male  with coronary artery disease status post PCI of the LAD in 2018, status post PCI of the posterior lateral branch of the RCA in May 2019, hypertension, hyperlipidemia. He has had continued symptoms of exertional chest discomfort and shortness of breath at low level activitydespite treatment with multidrug antianginal therapy. Recent stress testing was abnormal. He was referred to Dr. Burt Knack for further evaluation. Cardiac catheterization demonstrated moderate LAD stenosis unchanged from previous study, patent LCx with small vessel disease involving the OM branches not amenable to PCI and interval occlusion of the mid RCA with left-to-right collaterals. Patient was referred to Dr. Martinique for possible CTO PCI of the RCA. He underwent attempted CTO PCI of the RCA but this was unsuccessful. Continued medical therapy was recommended. He comes to our office for follow-up today overall doing well described to have rare episode of chest pain and shortness of breath but otherwise seems to be doing well he is able to perform activities of daily living, he is getting aggressive using right on mover.  Overall seems to be happy and satisfied with his life.  Past Medical History:  Diagnosis Date  . Arthritis    "hands, back" (03/02/2018)  . Chronic radicular low back pain   . Coronary artery disease    a. MI in 2002 s/p stenting to mRCA b.cath 11/2016 s/p DES to mLAD c. Cath 08/2017- patent LAD stent, 40% instent restenosis of mRCA, 99% posterolateral artery s/p PTCA & DES.  . Gout    "on RX qod" (03/02/2018)  . Heart attack  ([place]) 2002  . History of kidney stones   . Hyperlipidemia   . Hypertension     Past Surgical History:  Procedure Laterality Date  . APPENDECTOMY  1974  . BACK SURGERY    . CORONARY ANGIOPLASTY WITH STENT PLACEMENT  2002; 11/20/2016   "@ [place]; @ [facility]"  . CORONARY CTO INTERVENTION N/A 03/02/2018   Procedure: CORONARY CTO INTERVENTION - Right;  Surgeon: Martinique, Peter M, MD;  Location: [place] [facility];  Service: Cardiovascular;  Laterality: N/A;  . CORONARY STENT INTERVENTION N/A 11/20/2016   Procedure: CORONARY STENT INTERVENTION;  Surgeon: Nelva Bush, MD;  Location: [place] [facility];  Service: Cardiovascular;  Laterality: N/A;  . CORONARY STENT INTERVENTION N/A 09/09/2017   Procedure: CORONARY STENT INTERVENTION;  Surgeon: Burnell Blanks, MD;  Location: [place] [facility];  Service: Cardiovascular;  Laterality: N/A;  . HEMI-MICRODISCECTOMY LUMBAR LAMINECTOMY LEVEL 1 Left 2008   L4  . INTRAVASCULAR PRESSURE WIRE/FFR STUDY N/A 09/16/2017   Procedure: INTRAVASCULAR PRESSURE WIRE/FFR STUDY;  Surgeon: Belva Crome, MD;  Location: [place] [facility];  Service: Cardiovascular;  Laterality: N/A;  . INTRAVASCULAR ULTRASOUND/IVUS N/A 11/20/2016   Procedure: Intravascular Ultrasound/IVUS;  Surgeon: Nelva Bush, MD;  Location: [place] [facility];  Service: Cardiovascular;  Laterality: N/A;  . KNEE ARTHROSCOPY Left 1983  . LEFT HEART CATH AND CORONARY ANGIOGRAPHY N/A 11/20/2016   Procedure: LEFT HEART CATH AND CORONARY ANGIOGRAPHY;  Surgeon: Nelva Bush, MD;  Location: [place] [facility];  Service: Cardiovascular;  Laterality: N/A;  . LEFT HEART CATH AND CORONARY ANGIOGRAPHY N/A 09/09/2017   Procedure: LEFT HEART CATH AND CORONARY ANGIOGRAPHY;  Surgeon: Burnell Blanks, MD;  Location: [place] [facility];  Service: Cardiovascular;  Laterality: N/A;  . LEFT HEART CATH AND CORONARY ANGIOGRAPHY N/A 09/16/2017   Procedure: LEFT HEART CATH AND CORONARY ANGIOGRAPHY;   Surgeon: Belva Crome, MD;  Location: [place] [facility];  Service: Cardiovascular;  Laterality: N/A;  . LEFT HEART CATH AND CORONARY ANGIOGRAPHY N/A 02/21/2018   Procedure: LEFT HEART CATH AND CORONARY ANGIOGRAPHY;  Surgeon: Sherren Mocha, MD;  Location: [place] [facility];  Service: Cardiovascular;  Laterality: N/A;  . TONSILLECTOMY    . ULTRASOUND GUIDANCE FOR VASCULAR ACCESS  03/02/2018   Procedure: Ultrasound Guidance For Vascular Access;  Surgeon: Martinique, Peter M, MD;  Location: [place] [facility];  Service: Cardiovascular;;    Current Medications: Current Meds  Medication Sig  . acetaminophen (TYLENOL 8 HOUR) 650 MG CR tablet Take 650 mg by mouth every 8 (eight) hours as needed for pain.  [name] allopurinol (ZYLOPRIM) 100 MG tablet Take 100 mg by mouth every other day.   [name] amLODipine (NORVASC) 5 MG tablet Take 1 tablet (5 mg total) by mouth daily.  [name] aspirin EC 81 MG tablet Take 1 tablet (81 mg total) by mouth daily.  . clopidogrel (PLAVIX) 75 MG tablet TAKE ONE TABLET BY MOUTH ONCE DAILY WITH BREAKFAST  . Icosapent Ethyl (VASCEPA) 1 g CAPS Take 2 capsules (2 g total) by mouth 2 times daily at 12 noon and 4 pm.  . isosorbide mononitrate (IMDUR) 120 MG 24 hr tablet Take 1 tablet (120 mg total) by mouth daily.  [name] lisinopril (PRINIVIL,ZESTRIL) 2.5 MG tablet Take 1 tablet (2.5 mg total) by mouth daily.  . metoprolol tartrate (LOPRESSOR) 50 MG tablet Take 1.5 tablets (75 mg total) by mouth 2 (two) times daily.  . nitroGLYCERIN (NITROSTAT) 0.4 MG SL tablet DISSOLVE 1 TABLET UNDER THE TONGUE EVERY 5 MINUTES AS NEEDED FOR CHEST PAIN. DO NOT EXCEED A TOTAL OF 3 DOSES IN 15 MINUTES.  . ranolazine (RANEXA) 1000 MG SR tablet Take 1 tablet (1,000 mg total) by mouth 2 (two) times daily.  . rosuvastatin (CRESTOR) 40 MG tablet TAKE ONE TABLET BY MOUTH ONCE DAILY  . Vitamin D, Ergocalciferol, (DRISDOL) 50000 units CAPS capsule Take 50,000 Units by mouth every Sunday.      Allergies:   Penicillin g  and Zolpidem   Social History   Socioeconomic History  . Marital status: Married    Spouse name: Not on file  . Number of children: Not on file  . Years of education: Not on file  . Highest education level: Not on file  Occupational History  . Not on file  Social Needs  . Financial resource strain: Not on file  . Food insecurity    Worry: Not on file    Inability: Not on file  . Transportation needs    Medical: Not on file    Non-medical: Not on file  Tobacco Use  . Smoking status: Former Smoker    Packs/day: 1.00    Years: 35.00    Pack years: 35.00    Types: Cigarettes    Quit date: 02/06/2001    Years since quitting: 17.6  . Smokeless tobacco: Never Used  Substance and Sexual Activity  . Alcohol use: Yes    Alcohol/week: 8.0 standard drinks  Types: 8 Cans of beer per week  . Drug use: Never  . Sexual activity: Not Currently  Lifestyle  . Physical activity    Days per week: Not on file    Minutes per session: Not on file  . Stress: Not on file  Relationships  . Social Herbalist on phone: Not on file    Gets together: Not on file    Attends religious service: Not on file    Active member of club or organization: Not on file    Attends meetings of clubs or organizations: Not on file    Relationship status: Not on file  Other Topics Concern  . Not on file  Social History Narrative  . Not on file     Family History: The patient's family history includes Cancer in his father. ROS:   Please see the history of present illness.    All 14 point review of systems negative except as described per history of present illness  EKGs/Labs/Other Studies Reviewed:     Attempted CTO PCI 03/02/2018 1. Unsuccessful CTO PCI of the RCA. Unable to cross lesion with a wire.  Plan: would continue medical therapy. I don't feel further attempts at CTO PCI would be advisable. Will observe overnight for complications and plan DC in am. Recommend Aspirin 81mg  daily  for moderate CAD    Cardiac catheterization 02/21/2018 LAD proximal stent patent, mid 55; first septal branch ostial 38 RI ostial 50 LCx proximal 30, mid 30; OM 290 RCA mid 40 then stent 100 ISR; RPAVB stent patent RPDA filled by collaterals from first and second septal perforators   Nuclear stress test 01/11/2018 Myocardial perfusion is abnormal. Large fixed defect of the infero and infero lateral wall with minimal reversibility. Normal wall motion on gated imaging. This is an intermediate risk study. Overall left ventricular systolic function was normal. LV cavity size is normal. Nuclear stress EF: 54%. The left ventricular ejection fraction is mildly decreased (45-54%). There is no prior study for comparison.   Recent Labs: 03/03/2018: BUN 12; Creatinine, Ser 1.25; Hemoglobin 10.2; Platelets 211; Potassium 4.2; Sodium 135 05/17/2018: ALT 17  Recent Lipid Panel    Component Value Date/Time   CHOL 131 05/17/2018 1002   TRIG 285 (H) 05/17/2018 1002   HDL 41 05/17/2018 1002   CHOLHDL 3.2 05/17/2018 1002   CHOLHDL 4.3 09/09/2017 0352   VLDL 75 (H) 09/09/2017 0352   LDLCALC 33 05/17/2018 1002    Physical Exam:    VS:  Wt 203 lb (92.1 kg)   BMI 33.78 kg/m     Wt Readings from Last 3 Encounters:  10/05/18 203 lb (92.1 kg)  05/17/18 200 lb 12.8 oz (91.1 kg)  03/18/18 199 lb 12.8 oz (90.6 kg)     GEN:  Well nourished, well developed in no acute distress HEENT: Normal NECK: No JVD; No carotid bruits LYMPHATICS: No lymphadenopathy CARDIAC: RRR, no murmurs, no rubs, no gallops RESPIRATORY:  Clear to auscultation without rales, wheezing or rhonchi  ABDOMEN: Soft, non-tender, non-distended MUSCULOSKELETAL:  No edema; No deformity  SKIN: Warm and dry LOWER EXTREMITIES: no swelling NEUROLOGIC:  Alert and oriented x 3 PSYCHIATRIC:  Normal affect   ASSESSMENT:    1. Coronary artery disease of native artery of native heart with stable angina pectoris ([place])   2.  Obstructive sleep apnea   3. Dyslipidemia   4. Precordial chest pain    PLAN:    In order of problems listed above:  1. Coronary artery disease which is quite complicated and advanced.  Fairly asymptomatic with rare episode of chest pain.  We will continue present management including dual antiplatelets therapy 2. Obstructive sleep apnea followed by internal medicine team 3. Dyslipidemia on high intensity statin which I will continue I will schedule him to have fasting lipid 4. Precordial chest pain: Rare episodes controlled with medications which I will continue  Overall Mike stabilized.  Overall doing well we will continue present management   Medication Adjustments/Labs and Tests Ordered: Current medicines are reviewed at length with the patient today.  Concerns regarding medicines are outlined above.  No orders of the defined types were placed in this encounter.  Medication changes: No orders of the defined types were placed in this encounter.   Signed, Park Liter, MD, [facility] 10/05/2018 2:18 PM    [place]

## 2018-10-19 ENCOUNTER — Other Ambulatory Visit: Payer: Self-pay | Admitting: Cardiology

## 2018-12-16 ENCOUNTER — Other Ambulatory Visit: Payer: Self-pay | Admitting: Cardiology

## 2019-01-02 ENCOUNTER — Telehealth: Payer: Self-pay | Admitting: Cardiology

## 2019-01-02 NOTE — Telephone Encounter (Signed)
Called patient's wife back. She is wanting to know what she can do if her husband ever did have a heart attack. I advised her the best thing to do if she ever suspect this is to call 911 or get him t the emergency department. She was also asking if she should take a CPR class, I informed her that is her decision if she would like. I will consult with Dr. Agustin Cree and make sure he doesn't have any other recommendations.

## 2019-01-02 NOTE — Telephone Encounter (Signed)
Called patient's wife and informed her of Dr. Wendy Poet recommendation.

## 2019-01-02 NOTE — Telephone Encounter (Signed)
PATIENTS WIFE called and wants to know what to do if he had a heartattack and how she could help him.

## 2019-01-02 NOTE — Telephone Encounter (Signed)
It is always good to know CPR  And yes, anytime she suspect her husband has MI needs to call 911

## 2019-01-16 ENCOUNTER — Other Ambulatory Visit: Payer: Self-pay | Admitting: Cardiology

## 2019-01-16 ENCOUNTER — Other Ambulatory Visit: Payer: Self-pay | Admitting: Physician Assistant

## 2019-02-06 ENCOUNTER — Encounter: Payer: Self-pay | Admitting: Cardiology

## 2019-02-06 ENCOUNTER — Ambulatory Visit (INDEPENDENT_AMBULATORY_CARE_PROVIDER_SITE_OTHER): Payer: PPO | Admitting: Cardiology

## 2019-02-06 ENCOUNTER — Other Ambulatory Visit: Payer: Self-pay

## 2019-02-06 VITALS — BP 126/70 | HR 83 | Ht 65.0 in | Wt 201.4 lb

## 2019-02-06 DIAGNOSIS — G4733 Obstructive sleep apnea (adult) (pediatric): Secondary | ICD-10-CM

## 2019-02-06 DIAGNOSIS — R072 Precordial pain: Secondary | ICD-10-CM | POA: Diagnosis not present

## 2019-02-06 DIAGNOSIS — I25118 Atherosclerotic heart disease of native coronary artery with other forms of angina pectoris: Secondary | ICD-10-CM | POA: Diagnosis not present

## 2019-02-06 DIAGNOSIS — E782 Mixed hyperlipidemia: Secondary | ICD-10-CM | POA: Diagnosis not present

## 2019-02-06 MED ORDER — AMLODIPINE BESYLATE 10 MG PO TABS: 10.0000 mg | ORAL_TABLET | Freq: Every day | ORAL | 1 refills | Status: DC

## 2019-02-06 NOTE — Patient Instructions (Signed)
Medication Instructions:  Your physician has recommended you make the following change in your medication:  Increase Amlodipine to 10 mg daily  *If you need a refill on your cardiac medications before your next appointment, please call your pharmacy*  Lab Work: None.  If you have labs (blood work) drawn today and your tests are completely normal, you will receive your results only by: [name] MyChart Message (if you have MyChart) OR . A paper copy in the mail If you have any lab test that is abnormal or we need to change your treatment, we will call you to review the results.  Testing/Procedures: None.   Follow-Up: At [facility], you and your health needs are our priority.  As part of our continuing mission to provide you with exceptional heart care, we have created designated Provider Care Teams.  These Care Teams include your primary Cardiologist (physician) and Advanced Practice Providers (APPs -  Physician Assistants and Nurse Practitioners) who all work together to provide you with the care you need, when you need it.  Your next appointment:   5 months  The format for your next appointment:   In Person  Provider:   Jenne Campus, MD  Other Instructions

## 2019-02-06 NOTE — Progress Notes (Signed)
Cardiology Office Note:    Date:  02/06/2019   ID:  Audrie Lia Maish, DOB Aug 31, 1950, MRN [id number]  PCP:  [name], MD  Cardiologist:  Jenne Campus, MD    Referring MD: [name], MD   Chief Complaint  Patient presents with  . Follow-up  Doing well  History of Present Illness:    Karas Machnik Laur is a 68 y.o. male  with coronary artery disease status post PCI of the LAD in 2018, status post PCI of the posterior lateral branch of the RCA in May 2019, hypertension, hyperlipidemia. He has had continued symptoms of exertional chest discomfort and shortness of breath at low level activitydespite treatment with multidrug antianginal therapy. Recent stress testing was abnormal. He was referred to Dr. Burt Knack for further evaluation. Cardiac catheterization demonstrated moderate LAD stenosis unchanged from previous study, patent LCx with small vessel disease involving the OM branches not amenable to PCI and interval occlusion of the mid RCA with left-to-right collaterals. Patient was referred to Dr. Martinique for possible CTO PCI of the RCA. He underwent attempted CTO PCI of the RCA but this was unsuccessful. Continued medical therapy was recommended. Since that time has been doing fair.  He slows down overall but he described 3 episode of chest pain that were significant since I have seen him last time however he complained of having some exertional shortness of breath and sometimes tightness in the chest.  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  Past Medical History:  Diagnosis Date  . Arthritis    "hands, back" (03/02/2018)  . Chronic radicular low back pain   . Coronary artery disease    a. MI in 2002 s/p stenting to mRCA b.cath 11/2016 s/p DES to mLAD c. Cath 08/2017- patent LAD stent, 40% instent restenosis of mRCA, 99% posterolateral artery s/p PTCA & DES.  . Gout    "on RX qod" (03/02/2018)  .  Heart attack ([place]) 2002  . History of kidney stones   . Hyperlipidemia   . Hypertension     Past Surgical History:  Procedure Laterality Date  . APPENDECTOMY  1974  . BACK SURGERY    . CORONARY ANGIOPLASTY WITH STENT PLACEMENT  2002; 11/20/2016   "@ [place]; @ [facility]"  . CORONARY CTO INTERVENTION N/A 03/02/2018   Procedure: CORONARY CTO INTERVENTION - Right;  Surgeon: Martinique, Peter M, MD;  Location: [place] [facility];  Service: Cardiovascular;  Laterality: N/A;  . CORONARY STENT INTERVENTION N/A 11/20/2016   Procedure: CORONARY STENT INTERVENTION;  Surgeon: Nelva Bush, MD;  Location: [place] [facility];  Service: Cardiovascular;  Laterality: N/A;  . CORONARY STENT INTERVENTION N/A 09/09/2017   Procedure: CORONARY STENT INTERVENTION;  Surgeon: Burnell Blanks, MD;  Location: [place] [facility];  Service: Cardiovascular;  Laterality: N/A;  . HEMI-MICRODISCECTOMY LUMBAR LAMINECTOMY LEVEL 1 Left 2008   L4  . INTRAVASCULAR PRESSURE WIRE/FFR STUDY N/A 09/16/2017   Procedure: INTRAVASCULAR PRESSURE WIRE/FFR STUDY;  Surgeon: Belva Crome, MD;  Location: [place] [facility];  Service: Cardiovascular;  Laterality: N/A;  . INTRAVASCULAR ULTRASOUND/IVUS N/A 11/20/2016   Procedure: Intravascular Ultrasound/IVUS;  Surgeon: Nelva Bush, MD;  Location: [place] [facility];  Service: Cardiovascular;  Laterality: N/A;  . KNEE ARTHROSCOPY Left 1983  . LEFT HEART CATH AND CORONARY ANGIOGRAPHY N/A 11/20/2016   Procedure: LEFT HEART CATH AND CORONARY ANGIOGRAPHY;  Surgeon: Nelva Bush, MD;  Location: [place] [facility];  Service: Cardiovascular;  Laterality: N/A;  . LEFT HEART CATH AND CORONARY ANGIOGRAPHY N/A 09/09/2017   Procedure: LEFT HEART CATH AND CORONARY ANGIOGRAPHY;  Surgeon: Burnell Blanks, MD;  Location: [place] [facility];  Service: Cardiovascular;  Laterality: N/A;  . LEFT HEART CATH AND CORONARY ANGIOGRAPHY N/A 09/16/2017   Procedure: LEFT HEART CATH AND CORONARY  ANGIOGRAPHY;  Surgeon: Belva Crome, MD;  Location: [place] [facility];  Service: Cardiovascular;  Laterality: N/A;  . LEFT HEART CATH AND CORONARY ANGIOGRAPHY N/A 02/21/2018   Procedure: LEFT HEART CATH AND CORONARY ANGIOGRAPHY;  Surgeon: Sherren Mocha, MD;  Location: [place] [facility];  Service: Cardiovascular;  Laterality: N/A;  . TONSILLECTOMY    . ULTRASOUND GUIDANCE FOR VASCULAR ACCESS  03/02/2018   Procedure: Ultrasound Guidance For Vascular Access;  Surgeon: Martinique, Peter M, MD;  Location: [place] [facility];  Service: Cardiovascular;;    Current Medications: Current Meds  Medication Sig  . acetaminophen (TYLENOL 8 HOUR) 650 MG CR tablet Take 650 mg by mouth every 8 (eight) hours as needed for pain.  [name] allopurinol (ZYLOPRIM) 100 MG tablet Take 100 mg by mouth every other day.   [name] amLODipine (NORVASC) 5 MG tablet TAKE ONE TABLET BY MOUTH ONCE DAILY  . aspirin EC 81 MG tablet Take 1 tablet (81 mg total) by mouth daily.  . clopidogrel (PLAVIX) 75 MG tablet TAKE ONE TABLET BY MOUTH ONCE DAILY WITH BREAKFAST  . Icosapent Ethyl (VASCEPA) 1 g CAPS Take 2 capsules (2 g total) by mouth 2 times daily at 12 noon and 4 pm.  . isosorbide mononitrate (IMDUR) 120 MG 24 hr tablet Take 1 tablet (120 mg total) by mouth daily.  [name] lisinopril (PRINIVIL,ZESTRIL) 2.5 MG tablet Take 1 tablet (2.5 mg total) by mouth daily.  . metoprolol tartrate (LOPRESSOR) 50 MG tablet TAKE 1 AND 1/2 TABLETS BY MOUTH TWICE DAILY  . nitroGLYCERIN (NITROSTAT) 0.4 MG SL tablet DISSOLVE 1 TABLET UNDER THE TONGUE EVERY 5 MINUTES AS NEEDED FOR CHEST PAIN. DO NOT EXCEED A TOTAL OF 3 DOSES IN 15 MINUTES.  . ranolazine (RANEXA) 1000 MG SR tablet Take 1 tablet (1,000 mg total) by mouth 2 (two) times daily.  . rosuvastatin (CRESTOR) 40 MG tablet TAKE ONE TABLET BY MOUTH ONCE DAILY  . Vitamin D, Ergocalciferol, (DRISDOL) 50000 units CAPS capsule Take 50,000 Units by mouth every Sunday.      Allergies:   Penicillin g and Zolpidem    Social History   Socioeconomic History  . Marital status: Married    Spouse name: Not on file  . Number of children: Not on file  . Years of education: Not on file  . Highest education level: Not on file  Occupational History  . Not on file  Social Needs  . Financial resource strain: Not on file  . Food insecurity    Worry: Not on file    Inability: Not on file  . Transportation needs    Medical: Not on file    Non-medical: Not on file  Tobacco Use  . Smoking status: Former Smoker    Packs/day: 1.00    Years: 35.00    Pack years: 35.00    Types: Cigarettes    Quit date: 02/06/2001    Years since quitting: 18.0  . Smokeless tobacco: Never Used  Substance and Sexual Activity  . Alcohol use: Yes    Alcohol/week: 8.0 standard drinks  Types: 8 Cans of beer per week  . Drug use: Never  . Sexual activity: Not Currently  Lifestyle  . Physical activity    Days per week: Not on file    Minutes per session: Not on file  . Stress: Not on file  Relationships  . Social Herbalist on phone: Not on file    Gets together: Not on file    Attends religious service: Not on file    Active member of club or organization: Not on file    Attends meetings of clubs or organizations: Not on file    Relationship status: Not on file  Other Topics Concern  . Not on file  Social History Narrative  . Not on file     Family History: The patient's family history includes Cancer in his father. ROS:   Please see the history of present illness.    All 14 point review of systems negative except as described per history of present illness  EKGs/Labs/Other Studies Reviewed:      Recent Labs: 03/03/2018: BUN 12; Creatinine, Ser 1.25; Hemoglobin 10.2; Platelets 211; Potassium 4.2; Sodium 135 05/17/2018: ALT 17  Recent Lipid Panel    Component Value Date/Time   CHOL 131 05/17/2018 1002   TRIG 285 (H) 05/17/2018 1002   HDL 41 05/17/2018 1002   CHOLHDL 3.2 05/17/2018 1002    CHOLHDL 4.3 09/09/2017 0352   VLDL 75 (H) 09/09/2017 0352   LDLCALC 33 05/17/2018 1002    Physical Exam:    VS:  Wt 201 lb 6.4 oz (91.4 kg)   BMI 33.51 kg/m     Wt Readings from Last 3 Encounters:  02/06/19 201 lb 6.4 oz (91.4 kg)  10/05/18 203 lb (92.1 kg)  05/17/18 200 lb 12.8 oz (91.1 kg)     GEN:  Well nourished, well developed in no acute distress HEENT: Normal NECK: No JVD; No carotid bruits LYMPHATICS: No lymphadenopathy CARDIAC: RRR, no murmurs, no rubs, no gallops RESPIRATORY:  Clear to auscultation without rales, wheezing or rhonchi  ABDOMEN: Soft, non-tender, non-distended MUSCULOSKELETAL:  No edema; No deformity  SKIN: Warm and dry LOWER EXTREMITIES: no swelling NEUROLOGIC:  Alert and oriented x 3 PSYCHIATRIC:  Normal affect   ASSESSMENT:    1. Coronary artery disease of native artery of native heart with stable angina pectoris ([place])   2. Mixed hyperlipidemia   3. Precordial chest pain   4. Obstructive sleep apnea    PLAN:    In order of problems listed above:  1. Coronary artery disease diffuse advanced managed medically.  Still some recurrences of his chest pain.  Overall is a stable pattern ccs class II/III I will increase dose of amlodipine from 5 to 10 mg daily 2. Mixed dyslipidemia on appropriate medication which I will continue.  Fasting lipid profile will be checked today 3. Precordial chest pain as described above 4. Obstructive sleep apnea followed by internal medicine team   Medication Adjustments/Labs and Tests Ordered: Current medicines are reviewed at length with the patient today.  Concerns regarding medicines are outlined above.  No orders of the defined types were placed in this encounter.  Medication changes: No orders of the defined types were placed in this encounter.   Signed, Park Liter, MD, [facility] 02/06/2019 2:27 PM    [facility]

## 2019-02-06 NOTE — Addendum Note (Signed)
Addended by: Ashok Norris on: 02/06/2019 02:47 PM   Modules accepted: Orders

## 2019-02-07 LAB — LIPID PANEL
Chol/HDL Ratio: 4.6 ratio (ref 0.0–5.0)
Cholesterol, Total: 158 mg/dL (ref 100–199)
HDL: 34 mg/dL — ABNORMAL LOW (ref >39)
LDL Chol Calc (NIH): 81 mg/dL (ref 0–99)
Triglycerides: 263 mg/dL — ABNORMAL HIGH (ref 0–149)
VLDL Cholesterol Cal: 43 mg/dL — ABNORMAL HIGH (ref 5–40)

## 2019-02-10 ENCOUNTER — Telehealth: Payer: Self-pay | Admitting: Emergency Medicine

## 2019-02-10 DIAGNOSIS — E785 Hyperlipidemia, unspecified: Secondary | ICD-10-CM

## 2019-02-10 NOTE — Telephone Encounter (Signed)
Left message for patient to return call regarding lab results.  

## 2019-02-15 NOTE — Telephone Encounter (Signed)
Called patient informed him of lipid results and Dr. Wendy Poet recommendation to see the lipid clinic, patient verbally understood, referral place and patient informed to contact us within 1 week if he has not heard from them with an appointment. Patient verbally understood, no further questions.

## 2019-02-15 NOTE — Addendum Note (Signed)
Addended by: Ashok Norris on: 02/15/2019 03:57 PM   Modules accepted: Orders

## 2019-03-15 DIAGNOSIS — I10 Essential (primary) hypertension: Secondary | ICD-10-CM | POA: Insufficient documentation

## 2019-03-15 DIAGNOSIS — M5416 Radiculopathy, lumbar region: Secondary | ICD-10-CM

## 2019-03-15 DIAGNOSIS — G8929 Other chronic pain: Secondary | ICD-10-CM | POA: Insufficient documentation

## 2019-03-15 DIAGNOSIS — Z6834 Body mass index (BMI) 34.0-34.9, adult: Secondary | ICD-10-CM

## 2019-03-15 HISTORY — DX: Body mass index (BMI) 34.0-34.9, adult: Z68.34

## 2019-03-15 HISTORY — DX: Radiculopathy, lumbar region: M54.16

## 2019-03-15 HISTORY — DX: Essential (primary) hypertension: I10

## 2019-03-16 ENCOUNTER — Other Ambulatory Visit: Payer: Self-pay | Admitting: Cardiology

## 2019-03-16 NOTE — Telephone Encounter (Signed)
This is a [place] pt °

## 2019-03-27 DIAGNOSIS — Z23 Encounter for immunization: Secondary | ICD-10-CM | POA: Diagnosis not present

## 2019-03-27 DIAGNOSIS — R5381 Other malaise: Secondary | ICD-10-CM | POA: Diagnosis not present

## 2019-03-27 DIAGNOSIS — R5383 Other fatigue: Secondary | ICD-10-CM | POA: Diagnosis not present

## 2019-03-27 DIAGNOSIS — E782 Mixed hyperlipidemia: Secondary | ICD-10-CM | POA: Diagnosis not present

## 2019-03-27 DIAGNOSIS — I1310 Hypertensive heart and chronic kidney disease without heart failure, with stage 1 through stage 4 chronic kidney disease, or unspecified chronic kidney disease: Secondary | ICD-10-CM | POA: Diagnosis not present

## 2019-03-27 DIAGNOSIS — Z7902 Long term (current) use of antithrombotics/antiplatelets: Secondary | ICD-10-CM | POA: Diagnosis not present

## 2019-03-27 DIAGNOSIS — N1831 Chronic kidney disease, stage 3a: Secondary | ICD-10-CM | POA: Diagnosis not present

## 2019-03-27 DIAGNOSIS — M1A09X0 Idiopathic chronic gout, multiple sites, without tophus (tophi): Secondary | ICD-10-CM | POA: Diagnosis not present

## 2019-03-27 DIAGNOSIS — Z79899 Other long term (current) drug therapy: Secondary | ICD-10-CM | POA: Diagnosis not present

## 2019-03-27 DIAGNOSIS — I25119 Atherosclerotic heart disease of native coronary artery with unspecified angina pectoris: Secondary | ICD-10-CM | POA: Diagnosis not present

## 2019-03-27 DIAGNOSIS — J3089 Other allergic rhinitis: Secondary | ICD-10-CM | POA: Diagnosis not present

## 2019-03-27 DIAGNOSIS — G4733 Obstructive sleep apnea (adult) (pediatric): Secondary | ICD-10-CM | POA: Diagnosis not present

## 2019-03-27 DIAGNOSIS — R7303 Prediabetes: Secondary | ICD-10-CM | POA: Diagnosis not present

## 2019-04-10 ENCOUNTER — Other Ambulatory Visit: Payer: Self-pay | Admitting: Cardiology

## 2019-04-26 ENCOUNTER — Other Ambulatory Visit: Payer: Self-pay

## 2019-04-26 ENCOUNTER — Ambulatory Visit (INDEPENDENT_AMBULATORY_CARE_PROVIDER_SITE_OTHER): Payer: PPO | Admitting: Internal Medicine

## 2019-04-26 ENCOUNTER — Encounter: Payer: Self-pay | Admitting: Internal Medicine

## 2019-04-26 VITALS — BP 120/72 | HR 76 | Ht 65.0 in | Wt 204.2 lb

## 2019-04-26 DIAGNOSIS — E782 Mixed hyperlipidemia: Secondary | ICD-10-CM | POA: Diagnosis not present

## 2019-04-26 DIAGNOSIS — I25118 Atherosclerotic heart disease of native coronary artery with other forms of angina pectoris: Secondary | ICD-10-CM

## 2019-04-26 MED ORDER — ICOSAPENT ETHYL 1 G PO CAPS: 2.0000 g | ORAL_CAPSULE | Freq: Two times a day (BID) | ORAL | 3 refills | Status: DC

## 2019-04-26 NOTE — Progress Notes (Signed)
LIPID CLINIC CONSULT NOTE  Chief Complaint:  Manage dyslipidemia  Primary Care Physician: [name], MD  Primary Cardiologist:  Jenne Campus, MD  HPI:  Robert Ramos is a 69 y.o. male who is being seen today for the evaluation of dyslipidemia at the request of Park Liter, MD.  This is a pleasant 69 year old male with a extensive history of coronary disease with multiple stents and a chronic total occlusion which recently was intervened upon however unsuccessfully.  He has had recurrent anginal symptoms and other cardiac risk factors including hyperlipidemia, hypertension, prior MI and chronic low back pain.  He has been on rosuvastatin 40 mg and recently had a lipid profile which showed total cholesterol 131, triglycerides 285, HDL 41 and LDL 33.  He was referred for evaluation and management of his dyslipidemia, probably due to elevated triglycerides.  Review of his chart indicates he was also on Vascepa, however he says after being given explanation about the medicine being a capsule that you take by mouth twice daily, that he is not currently taking that medicine.  He is unclear whether he had that prescription filled or not whether prior authorization was necessary or obtained.  PMHx:  Past Medical History:  Diagnosis Date  . Arthritis    "hands, back" (03/02/2018)  . Chronic radicular low back pain   . Coronary artery disease    a. MI in 2002 s/p stenting to mRCA b.cath 11/2016 s/p DES to mLAD c. Cath 08/2017- patent LAD stent, 40% instent restenosis of mRCA, 99% posterolateral artery s/p PTCA & DES.  . Gout    "on RX qod" (03/02/2018)  . Heart attack ([place]) 2002  . History of kidney stones   . Hyperlipidemia   . Hypertension     Past Surgical History:  Procedure Laterality Date  . APPENDECTOMY  1974  . BACK SURGERY    . CORONARY ANGIOPLASTY WITH STENT PLACEMENT  2002; 11/20/2016   "@ [place]; @ [facility]"  . CORONARY CTO INTERVENTION N/A  03/02/2018   Procedure: CORONARY CTO INTERVENTION - Right;  Surgeon: Martinique, Peter M, MD;  Location: [place] [facility];  Service: Cardiovascular;  Laterality: N/A;  . CORONARY STENT INTERVENTION N/A 11/20/2016   Procedure: CORONARY STENT INTERVENTION;  Surgeon: Nelva Bush, MD;  Location: [place] [facility];  Service: Cardiovascular;  Laterality: N/A;  . CORONARY STENT INTERVENTION N/A 09/09/2017   Procedure: CORONARY STENT INTERVENTION;  Surgeon: Burnell Blanks, MD;  Location: [place] [facility];  Service: Cardiovascular;  Laterality: N/A;  . HEMI-MICRODISCECTOMY LUMBAR LAMINECTOMY LEVEL 1 Left 2008   L4  . INTRAVASCULAR PRESSURE WIRE/FFR STUDY N/A 09/16/2017   Procedure: INTRAVASCULAR PRESSURE WIRE/FFR STUDY;  Surgeon: Belva Crome, MD;  Location: [place] [facility];  Service: Cardiovascular;  Laterality: N/A;  . INTRAVASCULAR ULTRASOUND/IVUS N/A 11/20/2016   Procedure: Intravascular Ultrasound/IVUS;  Surgeon: Nelva Bush, MD;  Location: [place] [facility];  Service: Cardiovascular;  Laterality: N/A;  . KNEE ARTHROSCOPY Left 1983  . LEFT HEART CATH AND CORONARY ANGIOGRAPHY N/A 11/20/2016   Procedure: LEFT HEART CATH AND CORONARY ANGIOGRAPHY;  Surgeon: Nelva Bush, MD;  Location: [place] [facility];  Service: Cardiovascular;  Laterality: N/A;  . LEFT HEART CATH AND CORONARY ANGIOGRAPHY N/A 09/09/2017   Procedure: LEFT HEART CATH AND CORONARY ANGIOGRAPHY;  Surgeon: Burnell Blanks, MD;  Location: [place] [facility];  Service: Cardiovascular;  Laterality: N/A;  . LEFT HEART CATH AND CORONARY ANGIOGRAPHY N/A 09/16/2017  Procedure: LEFT HEART CATH AND CORONARY ANGIOGRAPHY;  Surgeon: Belva Crome, MD;  Location: [place] [facility];  Service: Cardiovascular;  Laterality: N/A;  . LEFT HEART CATH AND CORONARY ANGIOGRAPHY N/A 02/21/2018   Procedure: LEFT HEART CATH AND CORONARY ANGIOGRAPHY;  Surgeon: Sherren Mocha, MD;  Location: [place] [facility];  Service:  Cardiovascular;  Laterality: N/A;  . TONSILLECTOMY    . ULTRASOUND GUIDANCE FOR VASCULAR ACCESS  03/02/2018   Procedure: Ultrasound Guidance For Vascular Access;  Surgeon: Martinique, Peter M, MD;  Location: [place] [facility];  Service: Cardiovascular;;    FAMHx:  Family History  Problem Relation Age of Onset  . Cancer Father     SOCHx:   reports that he quit smoking about 18 years ago. His smoking use included cigarettes. He has a 35.00 pack-year smoking history. He has never used smokeless tobacco. He reports current alcohol use of about 8.0 standard drinks of alcohol per week. He reports that he does not use drugs.  ALLERGIES:  Allergies  Allergen Reactions  . Penicillin G Anaphylaxis, Swelling and Rash    Has patient had a PCN reaction causing immediate rash, facial/tongue/throat swelling, SOB or lightheadedness with hypotension:Yes Has patient had a PCN reaction causing severe rash involving mucus membranes or skin necrosis:Yes--all over body Has patient had a PCN reaction that required hospitalization:No--treated in ER Has patient had a PCN reaction occurring within the last 10 years:No If all of the above answers are "NO", then may proceed with Cephalosporin use.   [name] Zolpidem Other (See Comments)    dizziness    ROS: Pertinent items noted in HPI and remainder of comprehensive ROS otherwise negative.  HOME MEDS: Current Outpatient Medications on File Prior to Visit  Medication Sig Dispense Refill  . acetaminophen (TYLENOL 8 HOUR) 650 MG CR tablet Take 650 mg by mouth every 8 (eight) hours as needed for pain.    [name] allopurinol (ZYLOPRIM) 100 MG tablet Take 100 mg by mouth every other day.   3  . amLODipine (NORVASC) 10 MG tablet Take 1 tablet (10 mg total) by mouth daily. 90 tablet 1  . aspirin EC 81 MG tablet Take 1 tablet (81 mg total) by mouth daily. 90 tablet 3  . clopidogrel (PLAVIX) 75 MG tablet TAKE ONE TABLET BY MOUTH ONCE DAILY WITH BREAKFAST 90 tablet 2  .  isosorbide mononitrate (IMDUR) 120 MG 24 hr tablet Take 1 tablet (120 mg total) by mouth daily. 180 tablet 1  . lisinopril (PRINIVIL,ZESTRIL) 2.5 MG tablet Take 1 tablet (2.5 mg total) by mouth daily. 30 tablet 11  . metoprolol tartrate (LOPRESSOR) 50 MG tablet TAKE 1 AND 1/2 TABLETS BY MOUTH TWICE DAILY 270 tablet 1  . nitroGLYCERIN (NITROSTAT) 0.4 MG SL tablet DISSOLVE 1 TABLET UNDER THE TONGUE EVERY 5 MINUTES AS NEEDED FOR CHEST PAIN. DO NOT EXCEED A TOTAL OF 3 DOSES IN 15 MINUTES. 25 tablet 1  . ranolazine (RANEXA) 1000 MG SR tablet TAKE ONE TABLET BY MOUTH TWICE DAILY 180 tablet 1  . rosuvastatin (CRESTOR) 40 MG tablet TAKE ONE TABLET BY MOUTH ONCE DAILY 90 tablet 1  . Vitamin D, Ergocalciferol, (DRISDOL) 50000 units CAPS capsule Take 50,000 Units by mouth every Sunday.   3   No current facility-administered medications on file prior to visit.    LABS/IMAGING: No results found for this or any previous visit (from the past 48 hour(s)). No results found.  LIPID PANEL:    Component Value Date/Time   CHOL  158 02/06/2019 1629   TRIG 263 (H) 02/06/2019 1629   HDL 34 (L) 02/06/2019 1629   CHOLHDL 4.6 02/06/2019 1629   CHOLHDL 4.3 09/09/2017 0352   VLDL 75 (H) 09/09/2017 0352   LDLCALC 81 02/06/2019 1629    WEIGHTS: Wt Readings from Last 3 Encounters:  04/26/19 204 lb 3.2 oz (92.6 kg)  02/06/19 201 lb 6.4 oz (91.4 kg)  10/05/18 203 lb (92.1 kg)    VITALS: BP 120/72   Pulse 76   Ht 5\' 5"  (1.651 m)   Wt 204 lb 3.2 oz (92.6 kg)   SpO2 96%   BMI 33.98 kg/m   EXAM: General appearance: alert and no distress Neck: no carotid bruit, no JVD and thyroid not enlarged, symmetric, no tenderness/mass/nodules Lungs: clear to auscultation bilaterally Heart: regular rate and rhythm Abdomen: soft, non-tender; bowel sounds normal; no masses,  no organomegaly Extremities: extremities normal, atraumatic, no cyanosis or edema Pulses: 2+ and symmetric Skin: Skin color, texture, turgor  normal. No rashes or lesions Neurologic: Grossly normal Psych: Pleasant  EKG: Deferred  ASSESSMENT: 1. Mixed dyslipidemia, goal LDL less than 70 2. Coronary artery disease with history of MI and prior PCI's  PLAN: 1.   Mr. Hiser has a mixed dyslipidemia and is at target LDL less than 70 however triglycerides remain elevated.  Although he has Vascepa on his med list, is not clear that he is taking the medication.  In fact he does not remember taking 2 capsules twice daily which is the prescribed dosage.  We will go ahead and submit the medication to his pharmacy and advised him to start taking that.  I expect his lipids will improve.  It is not necessary to achieve a "normal" triglyceride level to achieve benefit on the medication.  We will plan virtual follow-up in about 3 months.  Thanks again for the kind referral.  Pixie Casino, MD, FACC, [place] Director of the Advanced Lipid Disorders &  Cardiovascular Risk Reduction Clinic Diplomate of the American Board of Clinical Lipidology Attending Cardiologist  Direct Dial: [phone]  Fax: [phone]  Website:  [email or web address] 04/26/2019, 3:45 PM

## 2019-04-26 NOTE — Patient Instructions (Signed)
Medication Instructions:  BEGIN TAKING VASCEPA (ICOSAPENT ETHYL) 1 G DAILY. = 2 CAPSULES TWICE DAILY *If you need a refill on your cardiac medications before your next appointment, please call your pharmacy*  Lab Work: FASTING LIPID PANEL AND DIRECT LDL IN 3 MONTHS If you have labs (blood work) drawn today and your tests are completely normal, you will receive your results only by: [name] MyChart Message (if you have MyChart) OR . A paper copy in the mail If you have any lab test that is abnormal or we need to change your treatment, we will call you to review the results.    Follow-Up: At [facility], you and your health needs are our priority.  As part of our continuing mission to provide you with exceptional heart care, we have created designated Provider Care Teams.  These Care Teams include your primary Cardiologist (physician) and Advanced Practice Providers (APPs -  Physician Assistants and Nurse Practitioners) who all work together to provide you with the care you need, when you need it.  Your next appointment:   3 month(s)  The format for your next appointment:   Virtual Visit   Provider:   K. Mali Hilty, MD

## 2019-05-10 DIAGNOSIS — M5126 Other intervertebral disc displacement, lumbar region: Secondary | ICD-10-CM | POA: Insufficient documentation

## 2019-05-10 HISTORY — DX: Other intervertebral disc displacement, lumbar region: M51.26

## 2019-06-03 ENCOUNTER — Other Ambulatory Visit: Payer: Self-pay | Admitting: Cardiology

## 2019-06-08 DIAGNOSIS — M961 Postlaminectomy syndrome, not elsewhere classified: Secondary | ICD-10-CM | POA: Insufficient documentation

## 2019-06-08 HISTORY — DX: Postlaminectomy syndrome, not elsewhere classified: M96.1

## 2019-06-13 ENCOUNTER — Telehealth: Payer: Self-pay | Admitting: Cardiology

## 2019-06-13 NOTE — Telephone Encounter (Signed)
New message      [facility] Pre-operative Risk Assessment    Request for surgical clearance:  1. What type of surgery is being performed? Lumbar Spine Transforaminal Bilateral L3 L4   2. When is this surgery scheduled?08/24/19  3. What type of clearance is required (medical clearance vs. Pharmacy clearance to hold med vs. Both)?medical  4. Are there any medications that need to be held prior to surgery and how long?Plavix needs to be stopped seven days prior to procedure   5. Practice name and name of physician performing surgery? [facility], Dr. Clydell Hakim  6. What is your office phone number [phone] ext [phone]   7.   What is your office fax number [phone]  8.   Anesthesia type (None, local, MAC, general) ? Local    Robert Ramos 06/13/2019, 3:46 PM  _________________________________________________________________   (provider comments below)

## 2019-06-13 NOTE — Telephone Encounter (Signed)
Dr. Agustin Cree, please review plavix. Last PCI was in 2019. Ok with you to hold plavix for 7 days prior to lumbar surgery?  Please send your response to P [facility] DIV PREOP

## 2019-06-14 NOTE — Telephone Encounter (Signed)
Yes, he must hold plavix 7 days before surgery, that is not optimal, but no choice here

## 2019-06-14 NOTE — Telephone Encounter (Signed)
Left a message for the patient to call back and speak to the on-call preop APP

## 2019-06-16 NOTE — Telephone Encounter (Signed)
[facility] was calling to check on the status of the pre-op clearance. The surgeon may be able to get him in sooner if the patient is cleared.

## 2019-06-16 NOTE — Telephone Encounter (Signed)
   Primary Cardiologist: Jenne Campus, MD  Chart reviewed as part of pre-operative protocol coverage. Patient was contacted 06/16/2019 in reference to pre-operative risk assessment for pending surgery as outlined below.  Robert Ramos was last seen on 02/06/2019 by Dr. Agustin Cree.  Since that day, Robert Ramos has continued to have intermittent chest pain, racing heart beats with associated diaphoresis waking him from sleep, and progressive DOE.  Due to new or worsening symptoms, Robert Ramos will require a follow-up visit for further pre-operative risk assessment.  Pre-op covering staff: - Please schedule appointment and call patient to inform them. - Please contact requesting surgeon's office via preferred method (i.e, phone, fax) to inform them of need for appointment prior to surgery.  Abigail Butts, PA-C 06/16/2019, 10:12 AM

## 2019-06-16 NOTE — Telephone Encounter (Signed)
Spoke with patient who is agreeable to see Dr Agustin Cree on 07/25/19 at 3:15 pm. Pt thanked me for the call. I will fax refill to requesting surgeon's office of recommendations.

## 2019-07-19 ENCOUNTER — Other Ambulatory Visit: Payer: Self-pay | Admitting: Cardiology

## 2019-07-25 ENCOUNTER — Ambulatory Visit: Payer: PPO | Admitting: Cardiology

## 2019-07-25 ENCOUNTER — Encounter: Payer: Self-pay | Admitting: Cardiology

## 2019-07-25 ENCOUNTER — Other Ambulatory Visit: Payer: Self-pay

## 2019-07-25 VITALS — BP 132/74 | HR 80 | Temp 97.5°F | Ht 65.0 in | Wt 196.6 lb

## 2019-07-25 DIAGNOSIS — I2510 Atherosclerotic heart disease of native coronary artery without angina pectoris: Secondary | ICD-10-CM

## 2019-07-25 DIAGNOSIS — R7303 Prediabetes: Secondary | ICD-10-CM | POA: Diagnosis not present

## 2019-07-25 DIAGNOSIS — I25118 Atherosclerotic heart disease of native coronary artery with other forms of angina pectoris: Secondary | ICD-10-CM | POA: Diagnosis not present

## 2019-07-25 DIAGNOSIS — I10 Essential (primary) hypertension: Secondary | ICD-10-CM

## 2019-07-25 DIAGNOSIS — R002 Palpitations: Secondary | ICD-10-CM

## 2019-07-25 DIAGNOSIS — E782 Mixed hyperlipidemia: Secondary | ICD-10-CM | POA: Diagnosis not present

## 2019-07-25 NOTE — Progress Notes (Signed)
Cardiology Office Note:    Date:  07/25/2019   ID:  Robert Ramos, DOB 1951/03/30, MRN [id number]  PCP:  [name], MD  Cardiologist:  Jenne Campus, MD    Referring MD: [name], MD   No chief complaint on file. I need back surgery  History of Present Illness:    Robert Ramos is a 69 y.o. male    with coronary artery disease status post PCI of the LAD in 2018, status post PCI of the posterior lateral branch of the RCA in May 2019, hypertension, hyperlipidemia. He has had continued symptoms of exertional chest discomfort and shortness of breath at low level activitydespite treatment with multidrug antianginal therapy. Recent stress testing was abnormal. He was referred to Dr. Burt Knack for further evaluation. Cardiac catheterization demonstrated moderate LAD stenosis unchanged from previous study, patent LCx with small vessel disease involving the OM branches not amenable to PCI and interval occlusion of the mid RCA with left-to-right collaterals. Patient was referred to Dr. Martinique for possible CTO PCI of the RCA. He underwent attempted CTO PCI of the RCA but this was unsuccessful. Continued medical therapy was recommended. Since that time has been doing fair. He came today to me because he is contemplating back surgery.  His ability to exercise is very limited he cannot go from back to front of [employer] without stopping multiple times because of back problem therefore it is impossible to assess his exercise ability.  He is also have some doubt if he will want to pursue surgical intervention on his back.  Client shared his concerns.  I told him if he decided to pursue it to me to do stress testing.  He also describes episode that he wakes up in the middle of the night with his heart pounding.  Will attempt again to put monitor him this time for 2 weeks hopefully will be able to identify what appropriate for him is being experiencing.  Past Medical History:    Diagnosis Date  . Arthritis    "hands, back" (03/02/2018)  . ASHD (arteriosclerotic heart disease) 08/27/2015  . Chronic radicular low back pain   . Coronary artery disease    a. MI in 2002 s/p stenting to mRCA b.cath 11/2016 s/p DES to mLAD c. Cath 08/2017- patent LAD stent, 40% instent restenosis of mRCA, 99% posterolateral artery s/p PTCA & DES.  [name] Dyslipidemia 11/17/2016  . Essential hypertension 08/27/2015  . Gout    "on RX qod" (03/02/2018)  . Heart attack ([place]) 2002  . History of kidney stones   . Hyperlipidemia   . Hypertension   . 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 Kidney stone 08/27/2015  . Long-term use of high-risk medication 08/27/2015  . Malaise and fatigue 08/27/2015  . Mixed hyperlipidemia 08/27/2015  . Non-seasonal allergic rhinitis 07/04/2018  . Obstructive sleep apnea 11/17/2016  . Precordial chest pain 11/24/2017  . Prediabetes 08/27/2015  . Shortness of breath 11/17/2016  . Stage 3 chronic kidney disease 08/27/2015    Past Surgical History:  Procedure Laterality Date  . APPENDECTOMY  1974  . BACK SURGERY    . CORONARY ANGIOPLASTY WITH STENT PLACEMENT  2002; 11/20/2016   "@ [place]; @ [facility]"  . CORONARY CTO INTERVENTION N/A 03/02/2018   Procedure: CORONARY CTO INTERVENTION - Right;  Surgeon: Martinique, Peter M, MD;  Location: [place] [facility];  Service: Cardiovascular;  Laterality: N/A;  . CORONARY STENT INTERVENTION N/A 11/20/2016   Procedure: CORONARY STENT INTERVENTION;  Surgeon: Nelva Bush, MD;  Location: [place] [facility];  Service: Cardiovascular;  Laterality: N/A;  . CORONARY STENT INTERVENTION N/A 09/09/2017   Procedure: CORONARY STENT INTERVENTION;  Surgeon: Burnell Blanks, MD;  Location: [place] [facility];  Service: Cardiovascular;  Laterality: N/A;  . HEMI-MICRODISCECTOMY LUMBAR LAMINECTOMY LEVEL 1 Left 2008   L4  . INTRAVASCULAR PRESSURE WIRE/FFR STUDY N/A 09/16/2017   Procedure: INTRAVASCULAR PRESSURE WIRE/FFR STUDY;  Surgeon: Belva Crome, MD;  Location: [place] [facility];  Service: Cardiovascular;  Laterality: N/A;  . INTRAVASCULAR ULTRASOUND/IVUS N/A 11/20/2016   Procedure: Intravascular Ultrasound/IVUS;  Surgeon: Nelva Bush, MD;  Location: [place] [facility];  Service: Cardiovascular;  Laterality: N/A;  . KNEE ARTHROSCOPY Left 1983  . LEFT HEART CATH AND CORONARY ANGIOGRAPHY N/A 11/20/2016   Procedure: LEFT HEART CATH AND CORONARY ANGIOGRAPHY;  Surgeon: Nelva Bush, MD;  Location: [place] [facility];  Service: Cardiovascular;  Laterality: N/A;  . LEFT HEART CATH AND CORONARY ANGIOGRAPHY N/A 09/09/2017   Procedure: LEFT HEART CATH AND CORONARY ANGIOGRAPHY;  Surgeon: Burnell Blanks, MD;  Location: [place] [facility];  Service: Cardiovascular;  Laterality: N/A;  . LEFT HEART CATH AND CORONARY ANGIOGRAPHY N/A 09/16/2017   Procedure: LEFT HEART CATH AND CORONARY ANGIOGRAPHY;  Surgeon: Belva Crome, MD;  Location: [place] [facility];  Service: Cardiovascular;  Laterality: N/A;  . LEFT HEART CATH AND CORONARY ANGIOGRAPHY N/A 02/21/2018   Procedure: LEFT HEART CATH AND CORONARY ANGIOGRAPHY;  Surgeon: Sherren Mocha, MD;  Location: [place] [facility];  Service: Cardiovascular;  Laterality: N/A;  . TONSILLECTOMY    . ULTRASOUND GUIDANCE FOR VASCULAR ACCESS  03/02/2018   Procedure: Ultrasound Guidance For Vascular Access;  Surgeon: Martinique, Peter M, MD;  Location: [place] [facility];  Service: Cardiovascular;;    Current Medications: Current Meds  Medication Sig  . acetaminophen (TYLENOL 8 HOUR) 650 MG CR tablet Take 650 mg by mouth every 8 (eight) hours as needed for pain.  [name] allopurinol (ZYLOPRIM) 100 MG tablet Take 100 mg by mouth every other day.   [name] amLODipine (NORVASC) 10 MG tablet Take 1 tablet (10 mg total) by mouth daily.  [name] aspirin EC 81 MG tablet Take 1 tablet (81 mg total) by mouth daily.  . clopidogrel (PLAVIX) 75 MG tablet TAKE ONE TABLET BY MOUTH ONCE DAILY WITH BREAKFAST  . icosapent Ethyl  (VASCEPA) 1 g capsule Take 2 capsules (2 g total) by mouth 2 times daily at 12 noon and 4 pm.  . isosorbide mononitrate (IMDUR) 120 MG 24 hr tablet Take 1 tablet (120 mg total) by mouth daily.  . metoprolol tartrate (LOPRESSOR) 50 MG tablet TAKE 1 AND 1/2 TABLETS BY MOUTH TWICE DAILY  . nitroGLYCERIN (NITROSTAT) 0.4 MG SL tablet DISSOLVE 1 TABLET UNDER THE TONGUE EVERY 5 MINUTES AS NEEDED FOR CHEST PAIN. DO NOT EXCEED A TOTAL OF 3 DOSES IN 15 MINUTES.  . ranolazine (RANEXA) 1000 MG SR tablet TAKE ONE TABLET BY MOUTH TWICE DAILY  . rosuvastatin (CRESTOR) 40 MG tablet TAKE ONE TABLET BY MOUTH ONCE DAILY  . Vitamin D, Ergocalciferol, (DRISDOL) 50000 units CAPS capsule Take 50,000 Units by mouth every Sunday.      Allergies:   Penicillin g and Zolpidem   Social History   Socioeconomic History  . Marital status: Married    Spouse name: Not on file  . Number of children: Not on file  . Years of education: Not on file  . Highest  education level: Not on file  Occupational History  . Not on file  Tobacco Use  . Smoking status: Former Smoker    Packs/day: 1.00    Years: 35.00    Pack years: 35.00    Types: Cigarettes    Quit date: 02/06/2001    Years since quitting: 18.4  . Smokeless tobacco: Never Used  Substance and Sexual Activity  . Alcohol use: Yes    Alcohol/week: 8.0 standard drinks    Types: 8 Cans of beer per week  . Drug use: Never  . Sexual activity: Not Currently  Other Topics Concern  . Not on file  Social History Narrative  . Not on file   Social Determinants of Health   Financial Resource Strain:   . Difficulty of Paying Living Expenses:   Food Insecurity:   . Worried About Charity fundraiser in the Last Year:   . Arboriculturist in the Last Year:   Transportation Needs:   . Film/video editor (Medical):   [name] Lack of Transportation (Non-Medical):   Physical Activity:   . Days of Exercise per Week:   . Minutes of Exercise per Session:   Stress:   .  Feeling of Stress :   Social Connections:   . Frequency of Communication with Friends and Family:   . Frequency of Social Gatherings with Friends and Family:   . Attends Religious Services:   . Active Member of Clubs or Organizations:   . Attends Archivist Meetings:   [name] Marital Status:      Family History: The patient's family history includes Cancer in his father. ROS:   Please see the history of present illness.    All 14 point review of systems negative except as described per history of present illness  EKGs/Labs/Other Studies Reviewed:      Recent Labs: No results found for requested labs within last 8760 hours.  Recent Lipid Panel    Component Value Date/Time   CHOL 158 02/06/2019 1629   TRIG 263 (H) 02/06/2019 1629   HDL 34 (L) 02/06/2019 1629   CHOLHDL 4.6 02/06/2019 1629   CHOLHDL 4.3 09/09/2017 0352   VLDL 75 (H) 09/09/2017 0352   LDLCALC 81 02/06/2019 1629    Physical Exam:    VS:  BP 132/74   Pulse 80   Temp (!) 97.5 F (36.4 C)   Ht 5\' 5"  (1.651 m)   Wt 196 lb 9.6 oz (89.2 kg)   SpO2 98%   BMI 32.72 kg/m     Wt Readings from Last 3 Encounters:  07/25/19 196 lb 9.6 oz (89.2 kg)  04/26/19 204 lb 3.2 oz (92.6 kg)  02/06/19 201 lb 6.4 oz (91.4 kg)     GEN:  Well nourished, well developed in no acute distress HEENT: Normal NECK: No JVD; No carotid bruits LYMPHATICS: No lymphadenopathy CARDIAC: RRR, no murmurs, no rubs, no gallops RESPIRATORY:  Clear to auscultation without rales, wheezing or rhonchi  ABDOMEN: Soft, non-tender, non-distended MUSCULOSKELETAL:  No edema; No deformity  SKIN: Warm and dry LOWER EXTREMITIES: no swelling NEUROLOGIC:  Alert and oriented x 3 PSYCHIATRIC:  Normal affect   ASSESSMENT:    1. ASHD (arteriosclerotic heart disease)   2. Coronary artery disease of native artery of native heart with stable angina pectoris ([place])   3. Essential hypertension   4. Prediabetes    PLAN:    In order of problems  listed above:  1. Coronary artery disease  advanced doing fair from that point review we will continue conservative approach. 2. Preop evaluation for potential back surgery.  I expressed my concern about his surgery he is also have some doubt in this for now he will want to stay back without surgery. 3. Essential hypertension blood pressure well controlled. 4. Prediabetes complementary medicine team. 5.    Medication Adjustments/Labs and Tests Ordered: Current medicines are reviewed at length with the patient today.  Concerns regarding medicines are outlined above.  No orders of the defined types were placed in this encounter.  Medication changes: No orders of the defined types were placed in this encounter.   Signed, Park Liter, MD, [facility] 07/25/2019 3:51 PM    [facility]

## 2019-07-25 NOTE — Patient Instructions (Signed)
Medication Instructions:  Your physician recommends that you continue on your current medications as directed. Please refer to the Current Medication list given to you today.  *If you need a refill on your cardiac medications before your next appointment, please call your pharmacy*   Lab Work: None.  If you have labs (blood work) drawn today and your tests are completely normal, you will receive your results only by: [name] MyChart Message (if you have MyChart) OR . A paper copy in the mail If you have any lab test that is abnormal or we need to change your treatment, we will call you to review the results.   Testing/Procedures: Your physician has requested that you have an echocardiogram. Echocardiography is a painless test that uses sound waves to create images of your heart. It provides your doctor with information about the size and shape of your heart and how well your heart's chambers and valves are working. This procedure takes approximately one hour. There are no restrictions for this procedure.  A zio monitor was ordered today. It will remain on for 14 days. You will then return monitor and event diary in provided box. It takes 1-2 weeks for report to be downloaded and returned to [place]. We will call you with the results. If monitor falls off or has orange flashing light, please call Zio for further instructions.      Follow-Up: At [facility], you and your health needs are our priority.  As part of our continuing mission to provide you with exceptional heart care, we have created designated Provider Care Teams.  These Care Teams include your primary Cardiologist (physician) and Advanced Practice Providers (APPs -  Physician Assistants and Nurse Practitioners) who all work together to provide you with the care you need, when you need it.  We recommend signing up for the patient portal called "MyChart".  Sign up information is provided on this After Visit Summary.  MyChart is used to  connect with patients for Virtual Visits (Telemedicine).  Patients are able to view lab/test results, encounter notes, upcoming appointments, etc.  Non-urgent messages can be sent to your provider as well.   To learn more about what you can do with MyChart, go to [email or web address].    Your next appointment:   5 month(s)  The format for your next appointment:   In Person  Provider:   Jenne Campus, MD   Other Instructions    Echocardiogram An echocardiogram is a procedure that uses painless sound waves (ultrasound) to produce an image of the heart. Images from an echocardiogram can provide important information about:  Signs of coronary artery disease (CAD).  Aneurysm detection. An aneurysm is a weak or damaged part of an artery wall that bulges out from the normal force of blood pumping through the body.  Heart size and shape. Changes in the size or shape of the heart can be associated with certain conditions, including heart failure, aneurysm, and CAD.  Heart muscle function.  Heart valve function.  Signs of a past heart attack.  Fluid buildup around the heart.  Thickening of the heart muscle.  A tumor or infectious growth around the heart valves. Tell a health care provider about:  Any allergies you have.  All medicines you are taking, including vitamins, herbs, eye drops, creams, and over-the-counter medicines.  Any blood disorders you have.  Any surgeries you have had.  Any medical conditions you have.  Whether you are pregnant or may be pregnant. What are the  this is a safe procedure. However, problems may occur, including:  Allergic reaction to dye (contrast) that may be used during the procedure. What happens before the procedure? No specific preparation is needed. You may eat and drink normally. What happens during the procedure?   An IV tube may be inserted into one of your veins.  You may receive contrast through this  tube. A contrast is an injection that improves the quality of the pictures from your heart.  A gel will be applied to your chest.  A wand-like tool (transducer) will be moved over your chest. The gel will help to transmit the sound waves from the transducer.  The sound waves will harmlessly bounce off of your heart to allow the heart images to be captured in real-time motion. The images will be recorded on a computer. The procedure may vary among health care providers and hospitals. What happens after the procedure?  You may return to your normal, everyday life, including diet, activities, and medicines, unless your health care provider tells you not to do that. Summary  An echocardiogram is a procedure that uses painless sound waves (ultrasound) to produce an image of the heart.  Images from an echocardiogram can provide important information about the size and shape of your heart, heart muscle function, heart valve function, and fluid buildup around your heart.  You do not need to do anything to prepare before this procedure. You may eat and drink normally.  After the echocardiogram is completed, you may return to your normal, everyday life, unless your health care provider tells you not to do that. This information is not intended to replace advice given to you by your health care provider. Make sure you discuss any questions you have with your health care provider. Document Revised: 07/21/2018 Document Reviewed: 05/02/2016 Elsevier Patient Education  2020 Elsevier Inc.   

## 2019-07-26 LAB — LDL CHOLESTEROL, DIRECT: LDL Direct: 45 mg/dL (ref 0–99)

## 2019-07-26 LAB — LIPID PANEL
Chol/HDL Ratio: 3.3 ratio (ref 0.0–5.0)
Cholesterol, Total: 116 mg/dL (ref 100–199)
HDL: 35 mg/dL — ABNORMAL LOW (ref >39)
LDL Chol Calc (NIH): 43 mg/dL (ref 0–99)
Triglycerides: 239 mg/dL — ABNORMAL HIGH (ref 0–149)
VLDL Cholesterol Cal: 38 mg/dL (ref 5–40)

## 2019-07-31 ENCOUNTER — Encounter: Payer: Self-pay | Admitting: Internal Medicine

## 2019-07-31 ENCOUNTER — Ambulatory Visit (INDEPENDENT_AMBULATORY_CARE_PROVIDER_SITE_OTHER): Payer: PPO | Admitting: Internal Medicine

## 2019-07-31 ENCOUNTER — Other Ambulatory Visit: Payer: Self-pay

## 2019-07-31 VITALS — BP 132/82 | HR 88 | Temp 97.3°F | Ht 65.0 in | Wt 199.0 lb

## 2019-07-31 DIAGNOSIS — E782 Mixed hyperlipidemia: Secondary | ICD-10-CM

## 2019-07-31 DIAGNOSIS — I2510 Atherosclerotic heart disease of native coronary artery without angina pectoris: Secondary | ICD-10-CM

## 2019-07-31 NOTE — Patient Instructions (Signed)
Medication Instructions:  TAKE vascepa 2 capsules twice daily Continue other current medications  *If you need a refill on your cardiac medications before your next appointment, please call your pharmacy*   Lab Work: FASTING lab work in 6 months to check cholesterol   If you have labs (blood work) drawn today and your tests are completely normal, you will receive your results only by: [name] MyChart Message (if you have MyChart) OR . A paper copy in the mail If you have any lab test that is abnormal or we need to change your treatment, we will call you to review the results.   Testing/Procedures: NONE   Follow-Up: At [facility], you and your health needs are our priority.  As part of our continuing mission to provide you with exceptional heart care, we have created designated Provider Care Teams.  These Care Teams include your primary Cardiologist (physician) and Advanced Practice Providers (APPs -  Physician Assistants and Nurse Practitioners) who all work together to provide you with the care you need, when you need it.  We recommend signing up for the patient portal called "MyChart".  Sign up information is provided on this After Visit Summary.  MyChart is used to connect with patients for Virtual Visits (Telemedicine).  Patients are able to view lab/test results, encounter notes, upcoming appointments, etc.  Non-urgent messages can be sent to your provider as well.   To learn more about what you can do with MyChart, go to [email or web address].    Your next appointment:   6 month(s) - lipid clinic  The format for your next appointment:   Either In Person or Virtual  Provider:   Raliegh Ip Mali Hilty, MD   Other Instructions  Co-pay assistance grant - [email or web address] >> disease funds >> hypercholesterolemia

## 2019-07-31 NOTE — Progress Notes (Signed)
LIPID CLINIC CONSULT NOTE  Chief Complaint:  Follow-up dyslipidemia  Primary Care Physician: [name], MD  Primary Cardiologist:  Jenne Campus, MD  HPI:  Robert Ramos is a 69 y.o. male who is being seen today for the evaluation of dyslipidemia at the request of [name], MD.  This is a pleasant 69 year old male with a extensive history of coronary disease with multiple stents and a chronic total occlusion which recently was intervened upon however unsuccessfully.  He has had recurrent anginal symptoms and other cardiac risk factors including hyperlipidemia, hypertension, prior MI and chronic low back pain.  He has been on rosuvastatin 40 mg and recently had a lipid profile which showed total cholesterol 131, triglycerides 285, HDL 41 and LDL 33.  He was referred for evaluation and management of his dyslipidemia, probably due to elevated triglycerides.  Review of his chart indicates he was also on Vascepa, however he says after being given explanation about the medicine being a capsule that you take by mouth twice daily, that he is not currently taking that medicine.  He is unclear whether he had that prescription filled or not whether prior authorization was necessary or obtained.  07/31/19  Mr. Riegler returns today for follow-up.  He is contemplating some upcoming surgery however was thought to be at increased risk for that.  He scheduled to have an upcoming echocardiogram.  He had recent repeat lipids which do show an improvement in LDL cholesterol from 81-43 however triglycerides remain elevated at 239.  He reports compliance with Vascepa however after further questioning it seems like he is only taking 2 capsules once daily.  PMHx:  Past Medical History:  Diagnosis Date  . Arthritis    "hands, back" (03/02/2018)  . ASHD (arteriosclerotic heart disease) 08/27/2015  . Chronic radicular low back pain   . Coronary artery disease    a. MI in 2002 s/p stenting to  mRCA b.cath 11/2016 s/p DES to mLAD c. Cath 08/2017- patent LAD stent, 40% instent restenosis of mRCA, 99% posterolateral artery s/p PTCA & DES.  [name] Dyslipidemia 11/17/2016  . Essential hypertension 08/27/2015  . Gout    "on RX qod" (03/02/2018)  . Heart attack ([place]) 2002  . History of kidney stones   . Hyperlipidemia   . Hypertension   . 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 Kidney stone 08/27/2015  . Long-term use of high-risk medication 08/27/2015  . Malaise and fatigue 08/27/2015  . Mixed hyperlipidemia 08/27/2015  . Non-seasonal allergic rhinitis 07/04/2018  . Obstructive sleep apnea 11/17/2016  . Precordial chest pain 11/24/2017  . Prediabetes 08/27/2015  . Shortness of breath 11/17/2016  . Stage 3 chronic kidney disease 08/27/2015    Past Surgical History:  Procedure Laterality Date  . APPENDECTOMY  1974  . BACK SURGERY    . CORONARY ANGIOPLASTY WITH STENT PLACEMENT  2002; 11/20/2016   "@ [place]; @ [facility]"  . CORONARY CTO INTERVENTION N/A 03/02/2018   Procedure: CORONARY CTO INTERVENTION - Right;  Surgeon: Martinique, Peter M, MD;  Location: [place] [facility];  Service: Cardiovascular;  Laterality: N/A;  . CORONARY STENT INTERVENTION N/A 11/20/2016   Procedure: CORONARY STENT INTERVENTION;  Surgeon: Nelva Bush, MD;  Location: [place] [facility];  Service: Cardiovascular;  Laterality: N/A;  . CORONARY STENT INTERVENTION N/A 09/09/2017   Procedure: CORONARY STENT INTERVENTION;  Surgeon: Burnell Blanks, MD;  Location: [place] [facility];  Service: Cardiovascular;  Laterality: N/A;  . HEMI-MICRODISCECTOMY  LUMBAR LAMINECTOMY LEVEL 1 Left 2008   L4  . INTRAVASCULAR PRESSURE WIRE/FFR STUDY N/A 09/16/2017   Procedure: INTRAVASCULAR PRESSURE WIRE/FFR STUDY;  Surgeon: Belva Crome, MD;  Location: [place] [facility];  Service: Cardiovascular;  Laterality: N/A;  . INTRAVASCULAR ULTRASOUND/IVUS N/A 11/20/2016   Procedure: Intravascular Ultrasound/IVUS;  Surgeon: Nelva Bush, MD;   Location: [place] [facility];  Service: Cardiovascular;  Laterality: N/A;  . KNEE ARTHROSCOPY Left 1983  . LEFT HEART CATH AND CORONARY ANGIOGRAPHY N/A 11/20/2016   Procedure: LEFT HEART CATH AND CORONARY ANGIOGRAPHY;  Surgeon: Nelva Bush, MD;  Location: [place] [facility];  Service: Cardiovascular;  Laterality: N/A;  . LEFT HEART CATH AND CORONARY ANGIOGRAPHY N/A 09/09/2017   Procedure: LEFT HEART CATH AND CORONARY ANGIOGRAPHY;  Surgeon: Burnell Blanks, MD;  Location: [place] [facility];  Service: Cardiovascular;  Laterality: N/A;  . LEFT HEART CATH AND CORONARY ANGIOGRAPHY N/A 09/16/2017   Procedure: LEFT HEART CATH AND CORONARY ANGIOGRAPHY;  Surgeon: Belva Crome, MD;  Location: [place] [facility];  Service: Cardiovascular;  Laterality: N/A;  . LEFT HEART CATH AND CORONARY ANGIOGRAPHY N/A 02/21/2018   Procedure: LEFT HEART CATH AND CORONARY ANGIOGRAPHY;  Surgeon: Sherren Mocha, MD;  Location: [place] [facility];  Service: Cardiovascular;  Laterality: N/A;  . TONSILLECTOMY    . ULTRASOUND GUIDANCE FOR VASCULAR ACCESS  03/02/2018   Procedure: Ultrasound Guidance For Vascular Access;  Surgeon: Martinique, Peter M, MD;  Location: [place] [facility];  Service: Cardiovascular;;    FAMHx:  Family History  Problem Relation Age of Onset  . Cancer Father     SOCHx:   reports that he quit smoking about 18 years ago. His smoking use included cigarettes. He has a 35.00 pack-year smoking history. He has never used smokeless tobacco. He reports current alcohol use of about 8.0 standard drinks of alcohol per week. He reports that he does not use drugs.  ALLERGIES:  Allergies  Allergen Reactions  . Penicillin G Anaphylaxis, Swelling and Rash    Has patient had a PCN reaction causing immediate rash, facial/tongue/throat swelling, SOB or lightheadedness with hypotension:Yes Has patient had a PCN reaction causing severe rash involving mucus membranes or skin necrosis:Yes--all over body Has  patient had a PCN reaction that required hospitalization:No--treated in ER Has patient had a PCN reaction occurring within the last 10 years:No If all of the above answers are "NO", then may proceed with Cephalosporin use.   [name] Zolpidem Other (See Comments)    dizziness    ROS: Pertinent items noted in HPI and remainder of comprehensive ROS otherwise negative.  HOME MEDS: Current Outpatient Medications on File Prior to Visit  Medication Sig Dispense Refill  . acetaminophen (TYLENOL 8 HOUR) 650 MG CR tablet Take 650 mg by mouth every 8 (eight) hours as needed for pain.    [name] allopurinol (ZYLOPRIM) 100 MG tablet Take 100 mg by mouth every other day.   3  . amLODipine (NORVASC) 10 MG tablet Take 1 tablet (10 mg total) by mouth daily. 90 tablet 1  . aspirin EC 81 MG tablet Take 1 tablet (81 mg total) by mouth daily. 90 tablet 3  . clopidogrel (PLAVIX) 75 MG tablet TAKE ONE TABLET BY MOUTH ONCE DAILY WITH BREAKFAST 90 tablet 2  . icosapent Ethyl (VASCEPA) 1 g capsule Take 2 capsules (2 g total) by mouth 2 times daily at 12 noon and 4 pm. 120 capsule 3  . isosorbide mononitrate (IMDUR) 120 MG  24 hr tablet Take 1 tablet (120 mg total) by mouth daily. 180 tablet 1  . metoprolol tartrate (LOPRESSOR) 50 MG tablet TAKE 1 AND 1/2 TABLETS BY MOUTH TWICE DAILY 270 tablet 1  . nitroGLYCERIN (NITROSTAT) 0.4 MG SL tablet DISSOLVE 1 TABLET UNDER THE TONGUE EVERY 5 MINUTES AS NEEDED FOR CHEST PAIN. DO NOT EXCEED A TOTAL OF 3 DOSES IN 15 MINUTES. 25 tablet 1  . ranolazine (RANEXA) 1000 MG SR tablet TAKE ONE TABLET BY MOUTH TWICE DAILY 180 tablet 1  . rosuvastatin (CRESTOR) 40 MG tablet TAKE ONE TABLET BY MOUTH ONCE DAILY 90 tablet 1  . Vitamin D, Ergocalciferol, (DRISDOL) 50000 units CAPS capsule Take 50,000 Units by mouth every Sunday.   3  . lisinopril (PRINIVIL,ZESTRIL) 2.5 MG tablet Take 1 tablet (2.5 mg total) by mouth daily. 30 tablet 11   No current facility-administered medications on file prior to  visit.    LABS/IMAGING: No results found for this or any previous visit (from the past 48 hour(s)). No results found.  LIPID PANEL:    Component Value Date/Time   CHOL 116 07/25/2019 0813   TRIG 239 (H) 07/25/2019 0813   HDL 35 (L) 07/25/2019 0813   CHOLHDL 3.3 07/25/2019 0813   CHOLHDL 4.3 09/09/2017 0352   VLDL 75 (H) 09/09/2017 0352   LDLCALC 43 07/25/2019 0813   LDLDIRECT 45 07/25/2019 0813    WEIGHTS: Wt Readings from Last 3 Encounters:  07/31/19 199 lb (90.3 kg)  07/25/19 196 lb 9.6 oz (89.2 kg)  04/26/19 204 lb 3.2 oz (92.6 kg)    VITALS: BP 132/82   Pulse 88   Temp (!) 97.3 F (36.3 C)   Ht 5\' 5"  (1.651 m)   Wt 199 lb (90.3 kg)   SpO2 96%   BMI 33.12 kg/m   EXAM: General appearance: alert and no distress Neck: no carotid bruit, no JVD and thyroid not enlarged, symmetric, no tenderness/mass/nodules Lungs: clear to auscultation bilaterally Heart: regular rate and rhythm Abdomen: soft, non-tender; bowel sounds normal; no masses,  no organomegaly Extremities: extremities normal, atraumatic, no cyanosis or edema Pulses: 2+ and symmetric Skin: Skin color, texture, turgor normal. No rashes or lesions Neurologic: Grossly normal Psych: Pleasant  EKG: Deferred  ASSESSMENT: 1. Mixed dyslipidemia, goal LDL less than 70 2. Coronary artery disease with history of MI and prior PCI's  PLAN: 1.   Mr. Gaspari has had some improvement in LDL cholesterol, likely due to weight loss and dietary changes.  His triglycerides remain elevated but he has not been compliant with the full dose of Vascepa.  I advised him to increase from 2 to 4 mg total daily and should have repeat lipids in about 3 months.  Follow-up with me in 6 months.  Pixie Casino, MD, [facility], [place] Director of the Advanced Lipid Disorders &  Cardiovascular Risk Reduction Clinic Diplomate of the American Board of Clinical Lipidology Attending Cardiologist    Direct Dial: [phone]  Fax: [phone]  Website:  [email or web address] Niva Murren 07/31/2019, 9:35 AM

## 2019-08-01 ENCOUNTER — Encounter: Payer: Self-pay | Admitting: Internal Medicine

## 2019-08-02 ENCOUNTER — Other Ambulatory Visit: Payer: Self-pay

## 2019-08-02 ENCOUNTER — Ambulatory Visit (INDEPENDENT_AMBULATORY_CARE_PROVIDER_SITE_OTHER): Payer: PPO

## 2019-08-02 DIAGNOSIS — I10 Essential (primary) hypertension: Secondary | ICD-10-CM

## 2019-08-02 DIAGNOSIS — R002 Palpitations: Secondary | ICD-10-CM | POA: Diagnosis not present

## 2019-08-02 DIAGNOSIS — I25118 Atherosclerotic heart disease of native coronary artery with other forms of angina pectoris: Secondary | ICD-10-CM | POA: Diagnosis not present

## 2019-08-07 ENCOUNTER — Telehealth: Payer: Self-pay | Admitting: Emergency Medicine

## 2019-08-07 NOTE — Telephone Encounter (Signed)
Left message for patient to return call regarding results  

## 2019-08-07 NOTE — Telephone Encounter (Signed)
Patient is returning call. Call transferred to [facility].

## 2019-09-08 DIAGNOSIS — K635 Polyp of colon: Secondary | ICD-10-CM | POA: Diagnosis not present

## 2019-09-08 DIAGNOSIS — Z86010 Personal history of colonic polyps: Secondary | ICD-10-CM | POA: Diagnosis not present

## 2019-09-08 DIAGNOSIS — K645 Perianal venous thrombosis: Secondary | ICD-10-CM | POA: Diagnosis not present

## 2019-09-08 DIAGNOSIS — D122 Benign neoplasm of ascending colon: Secondary | ICD-10-CM | POA: Diagnosis not present

## 2019-09-08 DIAGNOSIS — K5730 Diverticulosis of large intestine without perforation or abscess without bleeding: Secondary | ICD-10-CM | POA: Diagnosis not present

## 2019-09-08 DIAGNOSIS — D126 Benign neoplasm of colon, unspecified: Secondary | ICD-10-CM | POA: Diagnosis not present

## 2019-09-08 DIAGNOSIS — Z1211 Encounter for screening for malignant neoplasm of colon: Secondary | ICD-10-CM | POA: Diagnosis not present

## 2019-09-12 DIAGNOSIS — K6289 Other specified diseases of anus and rectum: Secondary | ICD-10-CM | POA: Diagnosis not present

## 2019-09-13 ENCOUNTER — Telehealth: Payer: Self-pay

## 2019-09-13 NOTE — Telephone Encounter (Signed)
    [facility] Pre-operative Risk Assessment    HEARTCARE STAFF: - Please ensure there is not already an duplicate clearance open for this procedure. - Under Visit Info/Reason for Call, type in Other and utilize the format Clearance MM/DD/YY or Clearance TBD. Do not use dashes or single digits. - If request is for dental extraction, please clarify the # of teeth to be extracted.  Request for surgical clearance:  1. What type of surgery is being performed? Excision of an anal papilla  2. When is this surgery scheduled? 09/25/19  3. What type of clearance is required (medical clearance vs. Pharmacy clearance to hold med vs. Both)? Medical   4. Are there any medications that need to be held prior to surgery and how long? None indicated   5. Practice name and name of physician performing surgery? [facility], Dr. Lilia Pro   6. What is the office phone number? [phone]   7.   What is the office fax number? [phone]  8.   Anesthesia type (None, local, MAC, general)? General    Tejal Monroy L Trinetta Alemu 09/13/2019, 4:43 PM  _________________________________________________________________   (provider comments below)

## 2019-09-14 NOTE — Telephone Encounter (Signed)
Left message for the patient to call back and speak to the preop APP. 

## 2019-09-15 NOTE — Telephone Encounter (Signed)
Very good assessment, the surgery is really very low risk for what put him at risk is generalized anesthesia.  In his situation I propose to do stress test first

## 2019-09-15 NOTE — Telephone Encounter (Signed)
See Dr. Wendy Poet comment, please arrange outpatient myoview given the need for general anesthesia for the surgery which place the patient at higher risk. Surgery is on 6/14.

## 2019-09-15 NOTE — Telephone Encounter (Signed)
Follow up     Pt is returning call  Call transferred to [facility]

## 2019-09-15 NOTE — Telephone Encounter (Signed)
I left a message for the patient to return my call.

## 2019-09-15 NOTE — Telephone Encounter (Signed)
Dr. Agustin Cree to review. Recent note from Apr 2021 during last office mentions patient may need back surgery and requires a stress test if so. However the requested surgery this time is excision of anal papilla. Per patient the size of papilla is about 1/4 inches by 1/4 inches. However the surgery will be done under general anesthesia.   Talking with the patient, he has dyspnea doing laundry and cannot accomplish 4 METS of activity. Otherwise, he denies any chest pain or any noticeable changes since last visit.   Since the type of surgery is very low risk, the only thing that increases his surgical risk is the general anesthesia.  Will forward to Dr. Agustin Cree to review if ok to proceed without further workup.  Please send your response to [facility] PREOP

## 2019-09-18 DIAGNOSIS — Z1159 Encounter for screening for other viral diseases: Secondary | ICD-10-CM | POA: Diagnosis not present

## 2019-09-18 DIAGNOSIS — Z1152 Encounter for screening for COVID-19: Secondary | ICD-10-CM | POA: Diagnosis not present

## 2019-09-18 NOTE — Telephone Encounter (Signed)
Hi Dr. Shanda Bumps,   I saw your message with Robert Ramos last week about recommending a stress test for upcoming low risk procedure given that general anesthesia will be used. Patient states he had a colonoscopy last week under sedation without any complications. Therefore, he wanted to know whether a stress test was still necessary? I tried to call patient to clarify what type of sedation was used during colonoscopy but was unable to reach him.  Please route response back to [facility] PREOP.  Thank you!

## 2019-09-18 NOTE — Telephone Encounter (Signed)
Patient returned phone call. °

## 2019-09-18 NOTE — Telephone Encounter (Signed)
I called and spoke with patient, he states that he was under sedation for colonoscopy last week without any complications. He is questioning the stress test since he did not have any issues with colonoscopy.

## 2019-09-19 NOTE — Telephone Encounter (Signed)
Good, no need for stress test until I see him in the office.

## 2019-09-20 NOTE — Telephone Encounter (Signed)
   Primary Cardiologist: Jenne Campus, MD  Chart reviewed as part of pre-operative protocol coverage. Patient contacted by Almyra Deforest, PA-C on 09/15/2019 at which time patient denied any chest pain but did report dyspnea with minimal activity such as doing the laundry. No noticeably changes since last office visit but did not sounds like he could accomplish 4 METS of activity. Procedure is a very low risk procedure; however, the fact that it will be done under general anesthesia increases that risk. Patient was discussed with Dr. Agustin Cree and initial plan was for stress test. However, patient said he had a colonoscopy last week under general anesthesia and tolerated it well with no complications. Therefore, Dr. Agustin Cree feels like it is ok to hold off on stress test at this time and OK to proceed with surgery. I relayed this message to the patient.   I will route this recommendation to the requesting party via Epic fax function and remove from pre-op pool.  Please call with questions.  Darreld Mclean, PA-C 09/20/2019, 2:27 PM

## 2019-09-25 DIAGNOSIS — K219 Gastro-esophageal reflux disease without esophagitis: Secondary | ICD-10-CM | POA: Diagnosis not present

## 2019-09-25 DIAGNOSIS — Z87891 Personal history of nicotine dependence: Secondary | ICD-10-CM | POA: Diagnosis not present

## 2019-09-25 DIAGNOSIS — I2510 Atherosclerotic heart disease of native coronary artery without angina pectoris: Secondary | ICD-10-CM | POA: Diagnosis not present

## 2019-09-25 DIAGNOSIS — K6289 Other specified diseases of anus and rectum: Secondary | ICD-10-CM | POA: Diagnosis not present

## 2019-09-25 DIAGNOSIS — I10 Essential (primary) hypertension: Secondary | ICD-10-CM | POA: Diagnosis not present

## 2019-09-25 DIAGNOSIS — E785 Hyperlipidemia, unspecified: Secondary | ICD-10-CM | POA: Diagnosis not present

## 2019-09-25 DIAGNOSIS — I252 Old myocardial infarction: Secondary | ICD-10-CM | POA: Diagnosis not present

## 2019-09-25 DIAGNOSIS — E669 Obesity, unspecified: Secondary | ICD-10-CM | POA: Diagnosis not present

## 2019-09-25 DIAGNOSIS — L728 Other follicular cysts of the skin and subcutaneous tissue: Secondary | ICD-10-CM | POA: Diagnosis not present

## 2019-09-25 DIAGNOSIS — Z955 Presence of coronary angioplasty implant and graft: Secondary | ICD-10-CM | POA: Diagnosis not present

## 2019-09-25 DIAGNOSIS — K602 Anal fissure, unspecified: Secondary | ICD-10-CM | POA: Diagnosis not present

## 2019-09-28 DIAGNOSIS — R5383 Other fatigue: Secondary | ICD-10-CM | POA: Diagnosis not present

## 2019-09-28 DIAGNOSIS — J3089 Other allergic rhinitis: Secondary | ICD-10-CM | POA: Diagnosis not present

## 2019-09-28 DIAGNOSIS — G8929 Other chronic pain: Secondary | ICD-10-CM

## 2019-09-28 DIAGNOSIS — I2510 Atherosclerotic heart disease of native coronary artery without angina pectoris: Secondary | ICD-10-CM | POA: Diagnosis not present

## 2019-09-28 DIAGNOSIS — G4733 Obstructive sleep apnea (adult) (pediatric): Secondary | ICD-10-CM | POA: Diagnosis not present

## 2019-09-28 DIAGNOSIS — R7303 Prediabetes: Secondary | ICD-10-CM | POA: Diagnosis not present

## 2019-09-28 DIAGNOSIS — R5381 Other malaise: Secondary | ICD-10-CM | POA: Diagnosis not present

## 2019-09-28 DIAGNOSIS — D649 Anemia, unspecified: Secondary | ICD-10-CM

## 2019-09-28 DIAGNOSIS — N1831 Chronic kidney disease, stage 3a: Secondary | ICD-10-CM | POA: Diagnosis not present

## 2019-09-28 DIAGNOSIS — Z79899 Other long term (current) drug therapy: Secondary | ICD-10-CM | POA: Diagnosis not present

## 2019-09-28 DIAGNOSIS — I129 Hypertensive chronic kidney disease with stage 1 through stage 4 chronic kidney disease, or unspecified chronic kidney disease: Secondary | ICD-10-CM | POA: Diagnosis not present

## 2019-09-28 DIAGNOSIS — E782 Mixed hyperlipidemia: Secondary | ICD-10-CM | POA: Diagnosis not present

## 2019-09-28 DIAGNOSIS — M1A09X0 Idiopathic chronic gout, multiple sites, without tophus (tophi): Secondary | ICD-10-CM | POA: Diagnosis not present

## 2019-09-28 DIAGNOSIS — N200 Calculus of kidney: Secondary | ICD-10-CM | POA: Diagnosis not present

## 2019-09-28 DIAGNOSIS — M25562 Pain in left knee: Secondary | ICD-10-CM | POA: Insufficient documentation

## 2019-09-28 HISTORY — DX: Other chronic pain: G89.29

## 2019-09-28 HISTORY — DX: Anemia, unspecified: D64.9

## 2019-09-29 ENCOUNTER — Other Ambulatory Visit: Payer: Self-pay | Admitting: Cardiology

## 2019-09-29 DIAGNOSIS — Z09 Encounter for follow-up examination after completed treatment for conditions other than malignant neoplasm: Secondary | ICD-10-CM | POA: Diagnosis not present

## 2019-10-25 DIAGNOSIS — M25562 Pain in left knee: Secondary | ICD-10-CM | POA: Diagnosis not present

## 2019-10-25 DIAGNOSIS — M1712 Unilateral primary osteoarthritis, left knee: Secondary | ICD-10-CM | POA: Diagnosis not present

## 2019-12-25 ENCOUNTER — Other Ambulatory Visit: Payer: Self-pay | Admitting: Internal Medicine

## 2020-01-22 DIAGNOSIS — G894 Chronic pain syndrome: Secondary | ICD-10-CM | POA: Insufficient documentation

## 2020-01-22 DIAGNOSIS — G8929 Other chronic pain: Secondary | ICD-10-CM

## 2020-01-22 DIAGNOSIS — M5441 Lumbago with sciatica, right side: Secondary | ICD-10-CM

## 2020-01-22 HISTORY — DX: Chronic pain syndrome: G89.4

## 2020-01-22 HISTORY — DX: Other chronic pain: G89.29

## 2020-01-22 HISTORY — DX: Lumbago with sciatica, right side: M54.41

## 2020-01-29 ENCOUNTER — Encounter: Payer: Self-pay | Admitting: Cardiology

## 2020-01-29 ENCOUNTER — Ambulatory Visit (INDEPENDENT_AMBULATORY_CARE_PROVIDER_SITE_OTHER): Payer: PPO | Admitting: Cardiology

## 2020-01-29 ENCOUNTER — Other Ambulatory Visit: Payer: Self-pay

## 2020-01-29 ENCOUNTER — Other Ambulatory Visit: Payer: Self-pay | Admitting: Cardiology

## 2020-01-29 VITALS — BP 104/72 | HR 60 | Ht 65.0 in | Wt 201.4 lb

## 2020-01-29 DIAGNOSIS — R7303 Prediabetes: Secondary | ICD-10-CM | POA: Diagnosis not present

## 2020-01-29 DIAGNOSIS — I10 Essential (primary) hypertension: Secondary | ICD-10-CM

## 2020-01-29 DIAGNOSIS — R0602 Shortness of breath: Secondary | ICD-10-CM

## 2020-01-29 DIAGNOSIS — R072 Precordial pain: Secondary | ICD-10-CM | POA: Diagnosis not present

## 2020-01-29 DIAGNOSIS — E782 Mixed hyperlipidemia: Secondary | ICD-10-CM

## 2020-01-29 DIAGNOSIS — I2510 Atherosclerotic heart disease of native coronary artery without angina pectoris: Secondary | ICD-10-CM | POA: Diagnosis not present

## 2020-01-29 NOTE — Progress Notes (Signed)
Cardiology Office Note:    Date:  01/29/2020   ID:  Robert Ramos, DOB 05/21/1950, MRN [id number]  PCP:  [name], MD  Cardiologist:  Jenne Campus, MD    Referring MD: [name], MD   No chief complaint on file. I am doing fine back back problem  History of Present Illness:    Robert Ramos is a 69 y.o. male  with coronary artery disease status post PCI of the LAD in 2018, status post PCI of the posterior lateral branch of the RCA in May 2019, hypertension, hyperlipidemia. He has had continued symptoms of exertional chest discomfort and shortness of breath at low level activitydespite treatment with multidrug antianginal therapy. Recent stress testing was abnormal. He was referred to Dr. Burt Knack for further evaluation. Cardiac catheterization demonstrated moderate LAD stenosis unchanged from previous study, patent LCx with small vessel disease involving the OM branches not amenable to PCI and interval occlusion of the mid RCA with left-to-right collaterals. Patient was referred to Dr. Martinique for possible CTO PCI of the RCA. He underwent attempted CTO PCI of the RCA but this was unsuccessful. Continued medical therapy was recommended. Comes today 2 months for follow-up overall he is doing well cardiac wise.  Denies have any chest pain, tightness, squeezing, burning in the chest, the biggest complaint like always is a chronic back pain and that prevent him from doing more.  He did have some injection however the last injection was unsuccessful he did have a lot of pain after that.  Is getting ready to see his back doctor again to talk about that.  Complain of having some shortness of breath and is difficult to tell if this is related to his heart or simply deconditioning  Past Medical History:  Diagnosis Date  . Arthritis    "hands, back" (03/02/2018)  . ASHD (arteriosclerotic heart disease) 08/27/2015  . Chronic radicular low back pain   . Coronary artery  disease    a. MI in 2002 s/p stenting to mRCA b.cath 11/2016 s/p DES to mLAD c. Cath 08/2017- patent LAD stent, 40% instent restenosis of mRCA, 99% posterolateral artery s/p PTCA & DES.  [name] Dyslipidemia 11/17/2016  . Essential hypertension 08/27/2015  . Gout    "on RX qod" (03/02/2018)  . Heart attack ([place]) 2002  . History of kidney stones   . Hyperlipidemia   . Hypertension   . 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 Kidney stone 08/27/2015  . Long-term use of high-risk medication 08/27/2015  . Malaise and fatigue 08/27/2015  . Mixed hyperlipidemia 08/27/2015  . Non-seasonal allergic rhinitis 07/04/2018  . Obstructive sleep apnea 11/17/2016  . Precordial chest pain 11/24/2017  . Prediabetes 08/27/2015  . Shortness of breath 11/17/2016  . Stage 3 chronic kidney disease 08/27/2015    Past Surgical History:  Procedure Laterality Date  . APPENDECTOMY  1974  . BACK SURGERY    . CORONARY ANGIOPLASTY WITH STENT PLACEMENT  2002; 11/20/2016   "@ [place]; @ [facility]"  . CORONARY CTO INTERVENTION N/A 03/02/2018   Procedure: CORONARY CTO INTERVENTION - Right;  Surgeon: Martinique, Peter M, MD;  Location: [place] [facility];  Service: Cardiovascular;  Laterality: N/A;  . CORONARY STENT INTERVENTION N/A 11/20/2016   Procedure: CORONARY STENT INTERVENTION;  Surgeon: Nelva Bush, MD;  Location: [place] [facility];  Service: Cardiovascular;  Laterality: N/A;  . CORONARY STENT INTERVENTION N/A 09/09/2017   Procedure: CORONARY STENT INTERVENTION;  Surgeon: Burnell Blanks, MD;  Location: [place] [facility];  Service: Cardiovascular;  Laterality: N/A;  . HEMI-MICRODISCECTOMY LUMBAR LAMINECTOMY LEVEL 1 Left 2008   L4  . INTRAVASCULAR PRESSURE WIRE/FFR STUDY N/A 09/16/2017   Procedure: INTRAVASCULAR PRESSURE WIRE/FFR STUDY;  Surgeon: Belva Crome, MD;  Location: [place] [facility];  Service: Cardiovascular;  Laterality: N/A;  . INTRAVASCULAR ULTRASOUND/IVUS N/A 11/20/2016   Procedure: Intravascular  Ultrasound/IVUS;  Surgeon: Nelva Bush, MD;  Location: [place] [facility];  Service: Cardiovascular;  Laterality: N/A;  . KNEE ARTHROSCOPY Left 1983  . LEFT HEART CATH AND CORONARY ANGIOGRAPHY N/A 11/20/2016   Procedure: LEFT HEART CATH AND CORONARY ANGIOGRAPHY;  Surgeon: Nelva Bush, MD;  Location: [place] [facility];  Service: Cardiovascular;  Laterality: N/A;  . LEFT HEART CATH AND CORONARY ANGIOGRAPHY N/A 09/09/2017   Procedure: LEFT HEART CATH AND CORONARY ANGIOGRAPHY;  Surgeon: Burnell Blanks, MD;  Location: [place] [facility];  Service: Cardiovascular;  Laterality: N/A;  . LEFT HEART CATH AND CORONARY ANGIOGRAPHY N/A 09/16/2017   Procedure: LEFT HEART CATH AND CORONARY ANGIOGRAPHY;  Surgeon: Belva Crome, MD;  Location: [place] [facility];  Service: Cardiovascular;  Laterality: N/A;  . LEFT HEART CATH AND CORONARY ANGIOGRAPHY N/A 02/21/2018   Procedure: LEFT HEART CATH AND CORONARY ANGIOGRAPHY;  Surgeon: Sherren Mocha, MD;  Location: [place] [facility];  Service: Cardiovascular;  Laterality: N/A;  . TONSILLECTOMY    . ULTRASOUND GUIDANCE FOR VASCULAR ACCESS  03/02/2018   Procedure: Ultrasound Guidance For Vascular Access;  Surgeon: Martinique, Peter M, MD;  Location: [place] [facility];  Service: Cardiovascular;;    Current Medications: Current Meds  Medication Sig  . acetaminophen (TYLENOL 8 HOUR) 650 MG CR tablet Take 650 mg by mouth every 8 (eight) hours as needed for pain.  [name] allopurinol (ZYLOPRIM) 100 MG tablet Take 100 mg by mouth every other day.   [name] amLODipine (NORVASC) 10 MG tablet Take 1 tablet (10 mg total) by mouth daily.  [name] aspirin EC 81 MG tablet Take 1 tablet (81 mg total) by mouth daily.  . clopidogrel (PLAVIX) 75 MG tablet TAKE ONE TABLET BY MOUTH ONCE DAILY WITH BREAKFAST  . gabapentin (NEURONTIN) 300 MG capsule Take 300 mg by mouth 3 (three) times daily.  [name] icosapent Ethyl (VASCEPA) 1 g capsule Take 2 capsules (2 g total) by mouth 2 times daily at 12  noon and 4 pm.  . isosorbide mononitrate (IMDUR) 120 MG 24 hr tablet Take 1 tablet (120 mg total) by mouth daily.  . metoprolol tartrate (LOPRESSOR) 50 MG tablet TAKE 1 AND 1/2 TABLETS BY MOUTH TWICE DAILY  . nitroGLYCERIN (NITROSTAT) 0.4 MG SL tablet DISSOLVE 1 TABLET UNDER THE TONGUE EVERY 5 MINUTES AS NEEDED FOR CHEST PAIN. DO NOT EXCEED A TOTAL OF 3 DOSES IN 15 MINUTES.  . ranolazine (RANEXA) 1000 MG SR tablet TAKE ONE TABLET BY MOUTH TWICE DAILY  . rosuvastatin (CRESTOR) 40 MG tablet TAKE ONE TABLET BY MOUTH ONCE DAILY  . SUTAB [id number] MG TABS as needed.  . Vitamin D, Ergocalciferol, (DRISDOL) 50000 units CAPS capsule Take 50,000 Units by mouth every Sunday.      Allergies:   Penicillin g and Zolpidem   Social History   Socioeconomic History  . Marital status: Married    Spouse name: Not on file  . Number of children: Not on file  . Years of education: Not on file  . Highest education level: Not on file  Occupational History  . Not on  file  Tobacco Use  . Smoking status: Former Smoker    Packs/day: 1.00    Years: 35.00    Pack years: 35.00    Types: Cigarettes    Quit date: 02/06/2001    Years since quitting: 18.9  . Smokeless tobacco: Never Used  Vaping Use  . Vaping Use: Never used  Substance and Sexual Activity  . Alcohol use: Yes    Alcohol/week: 8.0 standard drinks    Types: 8 Cans of beer per week  . Drug use: Never  . Sexual activity: Not Currently  Other Topics Concern  . Not on file  Social History Narrative  . Not on file   Social Determinants of Health   Financial Resource Strain:   . Difficulty of Paying Living Expenses: Not on file  Food Insecurity:   . Worried About Charity fundraiser in the Last Year: Not on file  . Ran Out of Food in the Last Year: Not on file  Transportation Needs:   . Lack of Transportation (Medical): Not on file  . Lack of Transportation (Non-Medical): Not on file  Physical Activity:   . Days of Exercise per Week:  Not on file  . Minutes of Exercise per Session: Not on file  Stress:   . Feeling of Stress : Not on file  Social Connections:   . Frequency of Communication with Friends and Family: Not on file  . Frequency of Social Gatherings with Friends and Family: Not on file  . Attends Religious Services: Not on file  . Active Member of Clubs or Organizations: Not on file  . Attends Archivist Meetings: Not on file  . Marital Status: Not on file     Family History: The patient's family history includes Cancer in his father. ROS:   Please see the history of present illness.    All 14 point review of systems negative except as described per history of present illness  EKGs/Labs/Other Studies Reviewed:      Recent Labs: No results found for requested labs within last 8760 hours.  Recent Lipid Panel    Component Value Date/Time   CHOL 116 07/25/2019 0813   TRIG 239 (H) 07/25/2019 0813   HDL 35 (L) 07/25/2019 0813   CHOLHDL 3.3 07/25/2019 0813   CHOLHDL 4.3 09/09/2017 0352   VLDL 75 (H) 09/09/2017 0352   LDLCALC 43 07/25/2019 0813   LDLDIRECT 45 07/25/2019 0813    Physical Exam:    VS:  There were no vitals taken for this visit.    Wt Readings from Last 3 Encounters:  07/31/19 199 lb (90.3 kg)  07/25/19 196 lb 9.6 oz (89.2 kg)  04/26/19 204 lb 3.2 oz (92.6 kg)     GEN:  Well nourished, well developed in no acute distress HEENT: Normal NECK: No JVD; No carotid bruits LYMPHATICS: No lymphadenopathy CARDIAC: RRR, no murmurs, no rubs, no gallops RESPIRATORY:  Clear to auscultation without rales, wheezing or rhonchi  ABDOMEN: Soft, non-tender, non-distended MUSCULOSKELETAL:  No edema; No deformity  SKIN: Warm and dry LOWER EXTREMITIES: no swelling NEUROLOGIC:  Alert and oriented x 3 PSYCHIATRIC:  Normal affect   ASSESSMENT:    1. ASHD (arteriosclerotic heart disease)   2. Essential hypertension   3. Mixed hyperlipidemia   4. Precordial chest pain   5.  Prediabetes    PLAN:    In order of problems listed above:  1. Atherosclerotic heart disease with details as described above.  He is asymptomatic  but at the same time he got very low level of exercise because of chronic back problem.  Denies having any recent episode of angina.  On appropriate antianginal therapy. 2. Essential hypertension blood pressure well controlled continue present management. 3. Dyslipidemia I did review his fasting lipid profile done by primary care physician his LDL is 47 which is acceptable.  His triglycerides is always elevated he also got elevated hemoglobin A1c.  He is trying to work with this with diet.  He has been follow-up by lipid clinic. 4. Possible preop cardiovascular evaluation he told me today again that he does not want to consider surgery.  I told him if he decided to pursue surgery will most likely have to do a stress test to make sure that his anatomy is unchanged.  He does have occluded artery which we know about already. 5. Dyspnea on exertion probably multifactorial, deconditioning play significant role here.  I will schedule him to have echocardiogram to assess left ventricle ejection fraction.   Medication Adjustments/Labs and Tests Ordered: Current medicines are reviewed at length with the patient today.  Concerns regarding medicines are outlined above.  No orders of the defined types were placed in this encounter.  Medication changes: No orders of the defined types were placed in this encounter.   Signed, Park Liter, MD, [facility] 01/29/2020 8:12 AM    [place]

## 2020-01-29 NOTE — Addendum Note (Signed)
Addended by: Senaida Ores on: 01/29/2020 08:31 AM   Modules accepted: Orders

## 2020-01-29 NOTE — Patient Instructions (Addendum)
Medication Instructions:  °Your physician recommends that you continue on your current medications as directed. Please refer to the Current Medication list given to you today. ° °*If you need a refill on your cardiac medications before your next appointment, please call your pharmacy* ° ° °Lab Work: °None. ° °If you have labs (blood work) drawn today and your tests are completely normal, you will receive your results only by: °• MyChart Message (if you have MyChart) OR °• A paper copy in the mail °If you have any lab test that is abnormal or we need to change your treatment, we will call you to review the results. ° ° °Testing/Procedures: °Your physician has requested that you have an echocardiogram. Echocardiography is a painless test that uses sound waves to create images of your heart. It provides your doctor with information about the size and shape of your heart and how well your heart’s chambers and valves are working. This procedure takes approximately one hour. There are no restrictions for this procedure. ° ° ° ° °Follow-Up: °At [facility], you and your health needs are our priority.  As part of our continuing mission to provide you with exceptional heart care, we have created designated Provider Care Teams.  These Care Teams include your primary Cardiologist (physician) and Advanced Practice Providers (APPs -  Physician Assistants and Nurse Practitioners) who all work together to provide you with the care you need, when you need it. ° °We recommend signing up for the patient portal called "MyChart".  Sign up information is provided on this After Visit Summary.  MyChart is used to connect with patients for Virtual Visits (Telemedicine).  Patients are able to view lab/test results, encounter notes, upcoming appointments, etc.  Non-urgent messages can be sent to your provider as well.   °To learn more about what you can do with MyChart, go to [email or web address].   ° °Your next appointment:   °6  month(s) ° °The format for your next appointment:   °In Person ° °Provider:   °Robert Krasowski, MD ° ° °Other Instructions ° ° °Echocardiogram °An echocardiogram is a procedure that uses painless sound waves (ultrasound) to produce an image of the heart. Images from an echocardiogram can provide important information about: °· Signs of coronary artery disease (CAD). °· Aneurysm detection. An aneurysm is a weak or damaged part of an artery wall that bulges out from the normal force of blood pumping through the body. °· Heart size and shape. Changes in the size or shape of the heart can be associated with certain conditions, including heart failure, aneurysm, and CAD. °· Heart muscle function. °· Heart valve function. °· Signs of a past heart attack. °· Fluid buildup around the heart. °· Thickening of the heart muscle. °· A tumor or infectious growth around the heart valves. °Tell a health care provider about: °· Any allergies you have. °· All medicines you are taking, including vitamins, herbs, eye drops, creams, and over-the-counter medicines. °· Any blood disorders you have. °· Any surgeries you have had. °· Any medical conditions you have. °· Whether you are pregnant or may be pregnant. °What are the risks? °Generally, this is a safe procedure. However, problems may occur, including: °· Allergic reaction to dye (contrast) that may be used during the procedure. °What happens before the procedure? °No specific preparation is needed. You may eat and drink normally. °What happens during the procedure? ° °· An IV tube may be inserted into one of your veins. °· You may   receive contrast through this tube. A contrast is an injection that improves the quality of the pictures from your heart. °· A gel will be applied to your chest. °· A wand-like tool (transducer) will be moved over your chest. The gel will help to transmit the sound waves from the transducer. °· The sound waves will harmlessly bounce off of your heart to  allow the heart images to be captured in real-time motion. The images will be recorded on a computer. °The procedure may vary among health care providers and hospitals. °What happens after the procedure? °· You may return to your normal, everyday life, including diet, activities, and medicines, unless your health care provider tells you not to do that. °Summary °· An echocardiogram is a procedure that uses painless sound waves (ultrasound) to produce an image of the heart. °· Images from an echocardiogram can provide important information about the size and shape of your heart, heart muscle function, heart valve function, and fluid buildup around your heart. °· You do not need to do anything to prepare before this procedure. You may eat and drink normally. °· After the echocardiogram is completed, you may return to your normal, everyday life, unless your health care provider tells you not to do that. °This information is not intended to replace advice given to you by your health care provider. Make sure you discuss any questions you have with your health care provider. °Document Revised: 07/21/2018 Document Reviewed: 05/02/2016 °Elsevier Patient Education © 2020 [employer] Inc[employer] ° ° °

## 2020-01-30 NOTE — Telephone Encounter (Signed)
Refill sent to pharmacy.   

## 2020-01-31 ENCOUNTER — Telehealth: Payer: Self-pay | Admitting: Emergency Medicine

## 2020-01-31 DIAGNOSIS — I2510 Atherosclerotic heart disease of native coronary artery without angina pectoris: Secondary | ICD-10-CM

## 2020-01-31 DIAGNOSIS — Z01818 Encounter for other preprocedural examination: Secondary | ICD-10-CM

## 2020-01-31 NOTE — Telephone Encounter (Signed)
   Primary Cardiologist: Jenne Campus, MD  Chart reviewed as part of pre-operative protocol coverage. Patient was evaluated by Dr. Agustin Cree 01/29/20 and preoperative status was discussed, should the patient decide to pursue surgical options for his chronic back pain. Per Dr. Agustin Cree, patient would likely need a stress test to ensure that his anatomy is unchanged. He also scheduled him for an echocardiogram at that time to further evaluation his dyspnea on exertion.  Will route to Dr. Agustin Cree to address further and will remove from the preop pool at this time.   Pre-op covering staff: - Please contact requesting surgeon's office via preferred method (i.e, phone, fax) to inform them of need for additional work-up prior to providing surgical clearance.  Abigail Butts, PA-C 01/31/2020, 4:56 PM

## 2020-01-31 NOTE — Telephone Encounter (Signed)
   [facility] Pre-operative Risk Assessment    HEARTCARE STAFF: - Please ensure there is not already an duplicate clearance open for this procedure. - Under Visit Info/Reason for Call, type in Other and utilize the format Clearance MM/DD/YY or Clearance TBD. Do not use dashes or single digits. - If request is for dental extraction, please clarify the # of teeth to be extracted.  Request for surgical clearance:  1. What type of surgery is being performed? Right L4 Transforamiral    2. When is this surgery scheduled? 03/12/2020   3. What type of clearance is required (medical clearance vs. Pharmacy clearance to hold med vs. Both)? Both  4. Are there any medications that need to be held prior to surgery and how long?Plavix for 7 days    5. Practice name and name of physician performing surgery? [facility]  Dr. Davy Pique   6. What is the office phone number? [phone]   7.   What is the office fax number? [phone]  8.   Anesthesia type (None, local, MAC, general) ? None specified    Robert Ramos 01/31/2020, 4:25 PM  _________________________________________________________________   (provider comments below)

## 2020-02-01 ENCOUNTER — Telehealth: Payer: Self-pay | Admitting: Cardiology

## 2020-02-01 NOTE — Telephone Encounter (Signed)
Lexiscan 02/08/20

## 2020-02-01 NOTE — Telephone Encounter (Signed)
I have placed an order for lexiscan. I have asked for the [place] office to assist in scheduling.

## 2020-02-01 NOTE — Telephone Encounter (Signed)
Called patient. Informed him he needs to have a stress test informed him of appointment date and time. Advised him of instructions and to hold metoprolol the morning of testing. He verbally understood nor further questions.

## 2020-02-01 NOTE — Telephone Encounter (Signed)
Please schedule him for [employer]

## 2020-02-01 NOTE — Addendum Note (Signed)
Addended by: Michae Kava on: 02/01/2020 12:11 PM   Modules accepted: Orders

## 2020-02-01 NOTE — Telephone Encounter (Signed)
Called patient to advise of Lexiscan that has been scheduled. Patient did not answer/left message for patient to call back.

## 2020-02-02 ENCOUNTER — Telehealth: Payer: Self-pay | Admitting: Cardiology

## 2020-02-02 NOTE — Telephone Encounter (Signed)
See notes below where pt called today and cancelled the Myoview.   02/02/2020 10:20 AM By: By: Erskine Squibb, Langston Masker   Cancel Rsn: Patient (pt said will no longer have surgery and doesnt need the stress test anymore)   I will let pre op team know surgery has been cancelled. Will send FYI notes to requesting office for surgery. Will remove from the pre op call back pool.

## 2020-02-02 NOTE — Telephone Encounter (Signed)
° °  [facility] Pre-operative Risk Assessment    HEARTCARE STAFF: - Please ensure there is not already an duplicate clearance open for this procedure. - Under Visit Info/Reason for Call, type in Other and utilize the format Clearance MM/DD/YY or Clearance TBD. Do not use dashes or single digits. - If request is for dental extraction, please clarify the # of teeth to be extracted.  Request for surgical clearance:  1. What type of surgery is being performed? Lumbar Spine Injection, transforaminal L4 on the right  2. When is this surgery scheduled? 03/12/20  3. What type of clearance is required (medical clearance vs. Pharmacy clearance to hold med vs. Both)? both  4. Are there any medications that need to be held prior to surgery and how long? Plavix 7 days prior to injection  5. Practice name and name of physician performing surgery? [facility], Dr. Lenord Carbo  6. What is the office phone number? [phone] ext [phone]   7.   What is the office fax number? [phone]  8.   Anesthesia type (None, local, MAC, general) ? local   Leah Newnam 02/02/2020, 2:50 PM  _________________________________________________________________   (provider comments below)

## 2020-02-02 NOTE — Telephone Encounter (Signed)
Pre-op covering staff, we received a message earlier this morning that patient was no longer having back procedure? Can we clarify with patient whether or not he is truly having spinal injection.   Thank you!

## 2020-02-02 NOTE — Telephone Encounter (Signed)
Robert Ramos from [facility] states the patient is not having surgery. She states he is only having the spine injection.

## 2020-02-02 NOTE — Telephone Encounter (Signed)
Patient no longer having surgery. Will remove from pre-op pool.

## 2020-02-02 NOTE — Telephone Encounter (Signed)
Left message for requesting office to please call back and confirm if pt is still having procedure. Left message as of this morning @ 10:20 the pt called and cancelled his stress test that is required for his pre op assessment, per pt call he stated to the operator that he is no longer having his procedure for the injection and there he does not need the stress test.   Left message to please call our office and confirm as we did receive another request. If the pt is still scheduled he is going to need to reschedule his Myoview.

## 2020-02-05 ENCOUNTER — Telehealth (HOSPITAL_COMMUNITY): Payer: Self-pay | Admitting: Cardiology

## 2020-02-05 NOTE — Telephone Encounter (Signed)
Patient cancelled Myoview due to no longer needing surgery. Order will be removed from the WQ. Thank you.

## 2020-02-05 NOTE — Telephone Encounter (Signed)
Hi Dr. Agustin Cree,  I saw that you previously recommended stress test prior to patient's back procedure. He called our office on Friday saying surgery got canceled and canceled his stress test. We then received another pre-op clearance form for a spinal injection under local anesthesia. Would you still like for him to have stress test before the injection?   Please route response to P CV DIV PREOP.  Thank you! Pranay Hilbun

## 2020-02-06 NOTE — Telephone Encounter (Signed)
Not for spinal injection,  No need to do stress test

## 2020-02-06 NOTE — Telephone Encounter (Signed)
In regards to cardiac eval for spinal injection the office is also asking if it is okay to hold Plavix 7 days prior to procedure??  He has a history of CAD s/p PCI in 2018 and PCI RCA in 2019. Cath in 02/2018 showed moderate LAD disease (unchaged), patent LCx with small vessel dz of OM branches not amenable o PCO and interval occlusion of mRCA with L-R collaterals. He underwent attempted CTO PCI of RCA but this was unsuccessful.   Please send back to CV DIV PREOP   Thank you,  Kanijah Groseclose Kathlen Mody, PA-C

## 2020-02-07 NOTE — Telephone Encounter (Signed)
Jarrett Soho from [facility] calling back to follow up on clearance.

## 2020-02-16 NOTE — Telephone Encounter (Signed)
Jarrett Soho from [facility] calling to follow up on clearance.

## 2020-02-16 NOTE — Telephone Encounter (Signed)
Should be fine to hold his Plavix for 5 days before spinal injection

## 2020-02-19 ENCOUNTER — Ambulatory Visit (INDEPENDENT_AMBULATORY_CARE_PROVIDER_SITE_OTHER): Payer: PPO

## 2020-02-19 ENCOUNTER — Other Ambulatory Visit: Payer: Self-pay

## 2020-02-19 DIAGNOSIS — I10 Essential (primary) hypertension: Secondary | ICD-10-CM

## 2020-02-19 DIAGNOSIS — R0602 Shortness of breath: Secondary | ICD-10-CM | POA: Diagnosis not present

## 2020-02-19 LAB — ECHOCARDIOGRAM COMPLETE
Area-P 1/2: 3.53 cm2
S' Lateral: 3.1 cm

## 2020-02-19 NOTE — Telephone Encounter (Signed)
   Primary Cardiologist: Jenne Campus, MD  Left a voicemail on cell and home phone asking patient to call back for ongoing preoperative assessment.   Abigail Butts, PA-C 02/19/2020, 12:24 PM

## 2020-02-19 NOTE — Telephone Encounter (Signed)
Leave message for pt to call back 

## 2020-02-19 NOTE — Telephone Encounter (Signed)
Pt returning call

## 2020-02-19 NOTE — Progress Notes (Signed)
Complete echocardiogram has been performed.  Jimmy Yani Lal RDCS, RVT 

## 2020-02-19 NOTE — Telephone Encounter (Signed)
   Primary Cardiologist: Jenne Campus, MD  Chart reviewed as part of pre-operative protocol coverage. Patient was contacted 02/19/2020 in reference to pre-operative risk assessment for pending surgery as outlined below.  Robert Ramos was last seen on 01/29/20 by Dr. Agustin Cree.  Since that day, Robert Ramos has done fine from a cardiac standpoint. He has chronic DOE which is unchanged. Predominate limiting factor in activity is back pain. He can still complete 4 METs without anginal complaints  Therefore, based on ACC/AHA guidelines, the patient would be at acceptable risk for the planned procedure without further cardiovascular testing.   Per Dr. Agustin Cree, patient can hold plavix 5 days prior to his upcoming procedure and should restart plavix when cleared to do so by his surgeon.   The patient was advised that if he develops new symptoms prior to surgery to contact our office to arrange for a follow-up visit, and he verbalized understanding.  I will route this recommendation to the requesting party via Epic fax function and remove from pre-op pool. Please call with questions.  Abigail Butts, PA-C 02/19/2020, 2:17 PM

## 2020-03-25 ENCOUNTER — Other Ambulatory Visit: Payer: Self-pay | Admitting: Cardiology

## 2020-03-25 NOTE — Telephone Encounter (Signed)
Rx refill sent to pharmacy. 

## 2020-04-02 ENCOUNTER — Other Ambulatory Visit: Payer: Self-pay | Admitting: Cardiology

## 2020-04-02 NOTE — Telephone Encounter (Signed)
Rx refill sent to pharmacy. 

## 2020-05-30 DIAGNOSIS — Z79899 Other long term (current) drug therapy: Secondary | ICD-10-CM | POA: Diagnosis not present

## 2020-05-30 DIAGNOSIS — R5383 Other fatigue: Secondary | ICD-10-CM | POA: Diagnosis not present

## 2020-05-30 DIAGNOSIS — I129 Hypertensive chronic kidney disease with stage 1 through stage 4 chronic kidney disease, or unspecified chronic kidney disease: Secondary | ICD-10-CM | POA: Diagnosis not present

## 2020-05-30 DIAGNOSIS — E782 Mixed hyperlipidemia: Secondary | ICD-10-CM | POA: Diagnosis not present

## 2020-05-30 DIAGNOSIS — R7303 Prediabetes: Secondary | ICD-10-CM | POA: Diagnosis not present

## 2020-05-30 DIAGNOSIS — M5116 Intervertebral disc disorders with radiculopathy, lumbar region: Secondary | ICD-10-CM | POA: Diagnosis not present

## 2020-05-30 DIAGNOSIS — Z125 Encounter for screening for malignant neoplasm of prostate: Secondary | ICD-10-CM | POA: Diagnosis not present

## 2020-05-30 DIAGNOSIS — G8929 Other chronic pain: Secondary | ICD-10-CM | POA: Diagnosis not present

## 2020-05-30 DIAGNOSIS — Z7982 Long term (current) use of aspirin: Secondary | ICD-10-CM | POA: Diagnosis not present

## 2020-05-30 DIAGNOSIS — Z809 Family history of malignant neoplasm, unspecified: Secondary | ICD-10-CM | POA: Diagnosis not present

## 2020-05-30 DIAGNOSIS — Z0001 Encounter for general adult medical examination with abnormal findings: Secondary | ICD-10-CM | POA: Diagnosis not present

## 2020-05-30 DIAGNOSIS — R5381 Other malaise: Secondary | ICD-10-CM | POA: Diagnosis not present

## 2020-05-30 DIAGNOSIS — I2510 Atherosclerotic heart disease of native coronary artery without angina pectoris: Secondary | ICD-10-CM | POA: Diagnosis not present

## 2020-05-30 DIAGNOSIS — M5030 Other cervical disc degeneration, unspecified cervical region: Secondary | ICD-10-CM | POA: Diagnosis not present

## 2020-05-30 DIAGNOSIS — Z87891 Personal history of nicotine dependence: Secondary | ICD-10-CM | POA: Diagnosis not present

## 2020-05-30 DIAGNOSIS — R251 Tremor, unspecified: Secondary | ICD-10-CM

## 2020-05-30 DIAGNOSIS — Z7902 Long term (current) use of antithrombotics/antiplatelets: Secondary | ICD-10-CM | POA: Diagnosis not present

## 2020-05-30 DIAGNOSIS — N1831 Chronic kidney disease, stage 3a: Secondary | ICD-10-CM | POA: Diagnosis not present

## 2020-05-30 DIAGNOSIS — Z0000 Encounter for general adult medical examination without abnormal findings: Secondary | ICD-10-CM | POA: Diagnosis not present

## 2020-05-30 DIAGNOSIS — N200 Calculus of kidney: Secondary | ICD-10-CM | POA: Diagnosis not present

## 2020-05-30 DIAGNOSIS — M25562 Pain in left knee: Secondary | ICD-10-CM | POA: Diagnosis not present

## 2020-05-30 DIAGNOSIS — G4733 Obstructive sleep apnea (adult) (pediatric): Secondary | ICD-10-CM | POA: Diagnosis not present

## 2020-05-30 DIAGNOSIS — Z20822 Contact with and (suspected) exposure to covid-19: Secondary | ICD-10-CM | POA: Diagnosis not present

## 2020-05-30 DIAGNOSIS — J3089 Other allergic rhinitis: Secondary | ICD-10-CM | POA: Diagnosis not present

## 2020-05-30 DIAGNOSIS — Z9889 Other specified postprocedural states: Secondary | ICD-10-CM | POA: Diagnosis not present

## 2020-05-30 DIAGNOSIS — D649 Anemia, unspecified: Secondary | ICD-10-CM | POA: Diagnosis not present

## 2020-05-30 HISTORY — DX: Tremor, unspecified: R25.1

## 2020-06-20 ENCOUNTER — Other Ambulatory Visit: Payer: Self-pay | Admitting: Cardiology

## 2020-06-27 DIAGNOSIS — M25561 Pain in right knee: Secondary | ICD-10-CM | POA: Diagnosis not present

## 2020-07-16 DIAGNOSIS — Z20822 Contact with and (suspected) exposure to covid-19: Secondary | ICD-10-CM | POA: Diagnosis not present

## 2020-07-16 DIAGNOSIS — J1000 Influenza due to other identified influenza virus with unspecified type of pneumonia: Secondary | ICD-10-CM | POA: Diagnosis not present

## 2020-07-16 DIAGNOSIS — J1100 Influenza due to unidentified influenza virus with unspecified type of pneumonia: Secondary | ICD-10-CM | POA: Diagnosis not present

## 2020-07-22 DIAGNOSIS — I898 Other specified noninfective disorders of lymphatic vessels and lymph nodes: Secondary | ICD-10-CM | POA: Diagnosis not present

## 2020-07-22 DIAGNOSIS — R053 Chronic cough: Secondary | ICD-10-CM | POA: Diagnosis not present

## 2020-07-22 DIAGNOSIS — Z8701 Personal history of pneumonia (recurrent): Secondary | ICD-10-CM | POA: Insufficient documentation

## 2020-07-22 HISTORY — DX: Personal history of pneumonia (recurrent): Z87.01

## 2020-07-27 ENCOUNTER — Other Ambulatory Visit: Payer: Self-pay | Admitting: Cardiology

## 2020-07-27 DIAGNOSIS — R079 Chest pain, unspecified: Secondary | ICD-10-CM | POA: Diagnosis not present

## 2020-07-27 DIAGNOSIS — S20211A Contusion of right front wall of thorax, initial encounter: Secondary | ICD-10-CM | POA: Diagnosis not present

## 2020-07-27 DIAGNOSIS — K5730 Diverticulosis of large intestine without perforation or abscess without bleeding: Secondary | ICD-10-CM | POA: Diagnosis not present

## 2020-07-27 DIAGNOSIS — R0789 Other chest pain: Secondary | ICD-10-CM | POA: Diagnosis not present

## 2020-07-27 DIAGNOSIS — J189 Pneumonia, unspecified organism: Secondary | ICD-10-CM | POA: Diagnosis not present

## 2020-07-27 DIAGNOSIS — R109 Unspecified abdominal pain: Secondary | ICD-10-CM | POA: Diagnosis not present

## 2020-07-27 DIAGNOSIS — J9811 Atelectasis: Secondary | ICD-10-CM | POA: Diagnosis not present

## 2020-07-27 DIAGNOSIS — S20301A Unspecified superficial injuries of right front wall of thorax, initial encounter: Secondary | ICD-10-CM | POA: Diagnosis not present

## 2020-07-27 DIAGNOSIS — N200 Calculus of kidney: Secondary | ICD-10-CM | POA: Diagnosis not present

## 2020-07-27 DIAGNOSIS — I2510 Atherosclerotic heart disease of native coronary artery without angina pectoris: Secondary | ICD-10-CM | POA: Diagnosis not present

## 2020-07-27 DIAGNOSIS — N281 Cyst of kidney, acquired: Secondary | ICD-10-CM | POA: Diagnosis not present

## 2020-07-27 DIAGNOSIS — R1110 Vomiting, unspecified: Secondary | ICD-10-CM | POA: Diagnosis not present

## 2020-07-27 DIAGNOSIS — I10 Essential (primary) hypertension: Secondary | ICD-10-CM | POA: Diagnosis not present

## 2020-07-27 DIAGNOSIS — I252 Old myocardial infarction: Secondary | ICD-10-CM | POA: Diagnosis not present

## 2020-07-29 NOTE — Telephone Encounter (Signed)
Metoprolol Tartrate 50 mg , Isosorbide Mononitrate 120 mg , Rosuvastatin 40 mg tablet refills to pharmacy

## 2020-07-30 ENCOUNTER — Other Ambulatory Visit: Payer: Self-pay

## 2020-07-30 DIAGNOSIS — E785 Hyperlipidemia, unspecified: Secondary | ICD-10-CM | POA: Insufficient documentation

## 2020-07-30 DIAGNOSIS — M1990 Unspecified osteoarthritis, unspecified site: Secondary | ICD-10-CM | POA: Insufficient documentation

## 2020-07-30 DIAGNOSIS — M109 Gout, unspecified: Secondary | ICD-10-CM | POA: Insufficient documentation

## 2020-07-30 DIAGNOSIS — Z87442 Personal history of urinary calculi: Secondary | ICD-10-CM | POA: Insufficient documentation

## 2020-08-01 ENCOUNTER — Encounter: Payer: Self-pay | Admitting: Cardiology

## 2020-08-01 ENCOUNTER — Ambulatory Visit: Payer: PPO | Admitting: Cardiology

## 2020-08-01 ENCOUNTER — Other Ambulatory Visit: Payer: Self-pay

## 2020-08-01 VITALS — BP 100/60 | HR 63 | Ht 65.0 in | Wt 193.0 lb

## 2020-08-01 DIAGNOSIS — I25118 Atherosclerotic heart disease of native coronary artery with other forms of angina pectoris: Secondary | ICD-10-CM | POA: Diagnosis not present

## 2020-08-01 DIAGNOSIS — M48062 Spinal stenosis, lumbar region with neurogenic claudication: Secondary | ICD-10-CM

## 2020-08-01 DIAGNOSIS — I10 Essential (primary) hypertension: Secondary | ICD-10-CM | POA: Diagnosis not present

## 2020-08-01 DIAGNOSIS — E782 Mixed hyperlipidemia: Secondary | ICD-10-CM | POA: Diagnosis not present

## 2020-08-01 DIAGNOSIS — Z01818 Encounter for other preprocedural examination: Secondary | ICD-10-CM | POA: Diagnosis not present

## 2020-08-01 NOTE — Progress Notes (Signed)
Cardiology Office Note:    Date:  08/01/2020   ID:  Robert Ramos, DOB [birth year], MRN [id number]  PCP:  [name], MD  Cardiologist:  Jenne Campus, MD    Referring MD: [name], MD   Chief Complaint  Patient presents with  . Medical Clearance  I need back surgery  History of Present Illness:    Robert Ramos is a 70 y.o. male  with coronary artery disease status post PCI of the LAD in 2018, status post PCI of the posterior lateral branch of the RCA in May 2019, hypertension, hyperlipidemia. He has had continued symptoms of exertional chest discomfort and shortness of breath at low level activitydespite treatment with multidrug antianginal therapy. Recent stress testing was abnormal. He was referred to Dr. Burt Knack for further evaluation. Cardiac catheterization demonstrated moderate LAD stenosis unchanged from previous study, patent LCx with small vessel disease involving the OM branches not amenable to PCI and interval occlusion of the mid RCA with left-to-right collaterals. Patient was referred to Dr. Martinique for possible CTO PCI of the RCA. He underwent attempted CTO PCI of the RCA but this was unsuccessful. Continued medical therapy was recommended. He comes today to our office today because he does have chronic back problem which really affects significantly his quality of life he cannot do anything he even cannot go to the restroom he had to sit on the toilet to urinate.  He cannot drive the car because it gave him a lot of pain.  He really wants to have his back surgery he would like to be evaluated before the surgery.  Denies have any chest pain tightness squeezing pressure burning chest but does not do practically anything because of horrible back pain.  Past Medical History:  Diagnosis Date  . Anemia 09/28/2019  . Arthritis    "hands, back" (03/02/2018)  . ASHD (arteriosclerotic heart disease) 08/27/2015  . Body mass index (BMI) 34.0-34.9, adult  03/15/2019  . Chronic bilateral low back pain with bilateral sciatica 01/22/2020  . Chronic pain of left knee 09/28/2019  . Chronic pain syndrome 01/22/2020  . Chronic radicular low back pain   . Coronary artery disease    a. MI in 2002 s/p stenting to mRCA b.cath 11/2016 s/p DES to mLAD c. Cath 08/2017- patent LAD stent, 40% instent restenosis of mRCA, 99% posterolateral artery s/p PTCA & DES.  [name] Disc displacement, lumbar 05/10/2019  . Dyslipidemia 11/17/2016  . Essential (primary) hypertension 03/15/2019  . Essential hypertension 08/27/2015  . Gout    "on RX qod" (03/02/2018)  . Heart attack ([place]) 2002  . History of bacterial pneumonia 07/22/2020  . History of kidney stones   . Hyperlipidemia   . Hypertension   . 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 Kidney stone 08/27/2015  . Long-term use of high-risk medication 08/27/2015  . Lumbago with sciatica, right side 01/22/2020  . Lumbar post-laminectomy syndrome 06/08/2019  . Malaise and fatigue 08/27/2015  . Mixed hyperlipidemia 08/27/2015  . Non-seasonal allergic rhinitis 07/04/2018  . Obstructive sleep apnea 11/17/2016  . Precordial chest pain 11/24/2017  . Prediabetes 08/27/2015  . Shortness of breath 11/17/2016  . Spinal stenosis, 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  . Stage 3 chronic kidney disease ([place]) 08/27/2015  . Tremor 05/30/2020    Past Surgical History:  Procedure Laterality Date  . APPENDECTOMY  1974  . BACK SURGERY    . CORONARY ANGIOPLASTY WITH STENT PLACEMENT  2002; 11/20/2016   "@ [facility]; @ [facility]"  . CORONARY CTO INTERVENTION N/A 03/02/2018   Procedure: CORONARY CTO INTERVENTION - Right;  Surgeon: Martinique, Peter M, MD;  Location: [place] [facility];  Service: Cardiovascular;  Laterality: N/A;  . CORONARY STENT INTERVENTION N/A 11/20/2016   Procedure: CORONARY STENT INTERVENTION;  Surgeon: Nelva Bush, MD;  Location: [place] [facility];  Service: Cardiovascular;  Laterality: N/A;  . CORONARY STENT  INTERVENTION N/A 09/09/2017   Procedure: CORONARY STENT INTERVENTION;  Surgeon: Burnell Blanks, MD;  Location: [place] [facility];  Service: Cardiovascular;  Laterality: N/A;  . HEMI-MICRODISCECTOMY LUMBAR LAMINECTOMY LEVEL 1 Left 2008   L4  . INTRAVASCULAR PRESSURE WIRE/FFR STUDY N/A 09/16/2017   Procedure: INTRAVASCULAR PRESSURE WIRE/FFR STUDY;  Surgeon: Belva Crome, MD;  Location: [place] [facility];  Service: Cardiovascular;  Laterality: N/A;  . INTRAVASCULAR ULTRASOUND/IVUS N/A 11/20/2016   Procedure: Intravascular Ultrasound/IVUS;  Surgeon: Nelva Bush, MD;  Location: [place] [facility];  Service: Cardiovascular;  Laterality: N/A;  . KNEE ARTHROSCOPY Left 1983  . LEFT HEART CATH AND CORONARY ANGIOGRAPHY N/A 11/20/2016   Procedure: LEFT HEART CATH AND CORONARY ANGIOGRAPHY;  Surgeon: Nelva Bush, MD;  Location: [place] [facility];  Service: Cardiovascular;  Laterality: N/A;  . LEFT HEART CATH AND CORONARY ANGIOGRAPHY N/A 09/09/2017   Procedure: LEFT HEART CATH AND CORONARY ANGIOGRAPHY;  Surgeon: Burnell Blanks, MD;  Location: [place] [facility];  Service: Cardiovascular;  Laterality: N/A;  . LEFT HEART CATH AND CORONARY ANGIOGRAPHY N/A 09/16/2017   Procedure: LEFT HEART CATH AND CORONARY ANGIOGRAPHY;  Surgeon: Belva Crome, MD;  Location: [place] [facility];  Service: Cardiovascular;  Laterality: N/A;  . LEFT HEART CATH AND CORONARY ANGIOGRAPHY N/A 02/21/2018   Procedure: LEFT HEART CATH AND CORONARY ANGIOGRAPHY;  Surgeon: Sherren Mocha, MD;  Location: [place] [facility];  Service: Cardiovascular;  Laterality: N/A;  . TONSILLECTOMY    . ULTRASOUND GUIDANCE FOR VASCULAR ACCESS  03/02/2018   Procedure: Ultrasound Guidance For Vascular Access;  Surgeon: Martinique, Peter M, MD;  Location: [place] [facility];  Service: Cardiovascular;;    Current Medications: Current Meds  Medication Sig  . acetaminophen (TYLENOL) 650 MG CR tablet Take 650 mg by mouth every 8 (eight)  hours as needed for pain.  [name] albuterol (VENTOLIN HFA) 108 (90 Base) MCG/ACT inhaler Inhale 2 puffs into the lungs every 4 (four) hours as needed for wheezing or shortness of breath.  . allopurinol (ZYLOPRIM) 100 MG tablet Take 100 mg by mouth every other day.  [name] amLODipine (NORVASC) 10 MG tablet Take 1 tablet (10 mg total) by mouth daily.  [name] aspirin EC 81 MG tablet Take 1 tablet (81 mg total) by mouth daily.  . benzonatate (TESSALON) 200 MG capsule Take 200 mg by mouth 3 (three) times daily as needed for cough.  . clopidogrel (PLAVIX) 75 MG tablet TAKE ONE TABLET BY MOUTH ONCE DAILY WITH BREAKFAST (Patient taking differently: Take 75 mg by mouth daily.)  . gabapentin (NEURONTIN) 300 MG capsule Take 300 mg by mouth 3 (three) times daily.  [name] icosapent Ethyl (VASCEPA) 1 g capsule Take 2 capsules (2 g total) by mouth 2 times daily at 12 noon and 4 pm.  . isosorbide mononitrate (IMDUR) 120 MG 24 hr tablet Take 1 tablet (120 mg total) by mouth daily.  [name] lisinopril (PRINIVIL,ZESTRIL) 2.5 MG tablet Take 1 tablet (2.5 mg total) by mouth daily.  . metoprolol tartrate (LOPRESSOR) 50 MG tablet TAKE 1 AND 1/2  TABLETS BY MOUTH TWICE DAILY (Patient taking differently: Take 100 mg by mouth 2 (two) times daily.)  . nitroGLYCERIN (NITROSTAT) 0.4 MG SL tablet DISSOLVE 1 TABLET UNDER THE TONGUE EVERY 5 MINUTES AS NEEDED FOR CHEST PAIN. DO NOT EXCEED A TOTAL OF 3 DOSES IN 15 MINUTES. (Patient taking differently: Place 0.4 mg under the tongue every 5 (five) minutes as needed for chest pain.)  . primidone (MYSOLINE) 50 MG tablet Take 50 mg by mouth at bedtime.  . ranolazine (RANEXA) 1000 MG SR tablet TAKE ONE TABLET BY MOUTH TWICE DAILY (Patient taking differently: Take 1,000 mg by mouth 2 (two) times daily.)  . rosuvastatin (CRESTOR) 40 MG tablet TAKE ONE TABLET BY MOUTH ONCE DAILY (Patient taking differently: Take 40 mg by mouth daily.)  . Vitamin D, Ergocalciferol, (DRISDOL) 50000 units CAPS capsule Take 50,000 Units by  mouth every Sunday.   . [DISCONTINUED] metoprolol succinate (TOPROL-XL) 50 MG 24 hr tablet Take 1 tablet by mouth daily.     Allergies:   Penicillin g, Penicillins, and Zolpidem   Social History   Socioeconomic History  . Marital status: Married    Spouse name: Not on file  . Number of children: Not on file  . Years of education: Not on file  . Highest education level: Not on file  Occupational History  . Not on file  Tobacco Use  . Smoking status: Former Smoker    Packs/day: 1.00    Years: 35.00    Pack years: 35.00    Types: Cigarettes    Quit date: 02/06/2001    Years since quitting: 19.4  . Smokeless tobacco: Never Used  Vaping Use  . Vaping Use: Never used  Substance and Sexual Activity  . Alcohol use: Yes    Alcohol/week: 8.0 standard drinks    Types: 8 Cans of beer per week  . Drug use: Never  . Sexual activity: Not Currently  Other Topics Concern  . Not on file  Social History Narrative  . Not on file   Social Determinants of Health   Financial Resource Strain: Not on file  Food Insecurity: Not on file  Transportation Needs: Not on file  Physical Activity: Not on file  Stress: Not on file  Social Connections: Not on file     Family History: The patient's family history includes Cancer in his father. ROS:   Please see the history of present illness.    All 14 point review of systems negative except as described per history of present illness  EKGs/Labs/Other Studies Reviewed:      Recent Labs: No results found for requested labs within last 8760 hours.  Recent Lipid Panel    Component Value Date/Time   CHOL 116 07/25/2019 0813   TRIG 239 (H) 07/25/2019 0813   HDL 35 (L) 07/25/2019 0813   CHOLHDL 3.3 07/25/2019 0813   CHOLHDL 4.3 09/09/2017 0352   VLDL 75 (H) 09/09/2017 0352   LDLCALC 43 07/25/2019 0813   LDLDIRECT 45 07/25/2019 0813    Physical Exam:    VS:  BP 100/60 (BP Location: Left Arm, Patient Position: Sitting)   Pulse 63   Ht  5\' 5"  (1.651 m)   Wt 193 lb (87.5 kg)   SpO2 97%   BMI 32.12 kg/m     Wt Readings from Last 3 Encounters:  08/01/20 193 lb (87.5 kg)  01/29/20 201 lb 6.4 oz (91.4 kg)  07/31/19 199 lb (90.3 kg)     GEN:  Well nourished,  well developed in no acute distress HEENT: Normal NECK: No JVD; No carotid bruits LYMPHATICS: No lymphadenopathy CARDIAC: RRR, no murmurs, no rubs, no gallops RESPIRATORY:  Clear to auscultation without rales, wheezing or rhonchi  ABDOMEN: Soft, non-tender, non-distended MUSCULOSKELETAL:  No edema; No deformity  SKIN: Warm and dry LOWER EXTREMITIES: no swelling NEUROLOGIC:  Alert and oriented x 3 PSYCHIATRIC:  Normal affect   ASSESSMENT:    1. Coronary artery disease of native artery of native heart with stable angina pectoris ([place])   2. Spinal stenosis, lumbar region with neurogenic claudication   3. Mixed hyperlipidemia   4. Essential hypertension    PLAN:    In order of problems listed above:  1. Coronary disease with stable angina pectoris.  Denies having any recent episodes.  I think there is some value in doing stress testing to assess him before that surgery.  We know already about completely occluded right coronary artery but I will make sure that there is no other areas of ischemia.  Overall surgery would be somewhat risky because of not revascularized right coronary artery bad but I think he does.  He is at the point in the matter with outcome from cardiac standpoint reviewed his he would like to have a back surgery because he simply cannot function like that.  Therefore we will do Lexiscan trying to determine area of ischemia. 2. Spinal stenosis which is very symptomatic and his quality of life is very low.  Hopefully after surgery he will feel better. 3. Mixed dyslipidemia, he is on statin, I do have his last fasting lipid profile from 8 April of last year LDL 45 HDL 35 we will continue present management. 4. Cardiovascular preop evaluation we will  schedule him to have Lexiscan to assess his risk overall he is going to be at least intermediate risk because of not revascularized right coronary artery and also inability to assess his exercise capacity because of chronic back problem.   Medication Adjustments/Labs and Tests Ordered: Current medicines are reviewed at length with the patient today.  Concerns regarding medicines are outlined above.  No orders of the defined types were placed in this encounter.  Medication changes: No orders of the defined types were placed in this encounter.   Signed, Park Liter, MD, [facility] 08/01/2020 4:47 PM    [place]

## 2020-08-01 NOTE — Patient Instructions (Signed)
Medication Instructions:  Your physician recommends that you continue on your current medications as directed. Please refer to the Current Medication list given to you today.  *If you need a refill on your cardiac medications before your next appointment, please call your pharmacy*   Lab Work: None If you have labs (blood work) drawn today and your tests are completely normal, you will receive your results only by: MyChart Message (if you have MyChart) OR A paper copy in the mail If you have any lab test that is abnormal or we need to change your treatment, we will call you to review the results.   Testing/Procedures:   [facility] [address] [place],  [address] Phone:  [phone]    Please arrive 15 minutes prior to your appointment time for registration and insurance purposes.  The test will take approximately 3 to 4 hours to complete; you may bring reading material.  If someone comes with you to your appointment, they will need to remain in the main lobby due to limited space in the testing area. **If you are pregnant or breastfeeding, please notify the nuclear lab prior to your appointment**  How to prepare for your Myocardial Perfusion Test: Do not eat or drink 3 hours prior to your test, except you may have water. Do not consume products containing caffeine (regular or decaffeinated) 12 hours prior to your test. (ex: coffee, chocolate, sodas, tea). Do bring a list of your current medications with you.  If not listed below, you may take your medications as normal. Do wear comfortable clothes (no dresses or overalls) and walking shoes, tennis shoes preferred (No heels or open toe shoes are allowed). Do NOT wear cologne, perfume, aftershave, or lotions (deodorant is allowed). If these instructions are not followed, your test will have to be rescheduled.  Please report to [address] for your test.  If you have questions or  concerns about your appointment, you can call the [facility] at [phone].  If you cannot keep your appointment, please provide 24 hours notification to the Nuclear Lab, to avoid a possible $50 charge to your account.    Follow-Up: At [facility], you and your health needs are our priority.  As part of our continuing mission to provide you with exceptional heart care, we have created designated Provider Care Teams.  These Care Teams include your primary Cardiologist (physician) and Advanced Practice Providers (APPs -  Physician Assistants and Nurse Practitioners) who all work together to provide you with the care you need, when you need it.  We recommend signing up for the patient portal called "MyChart".  Sign up information is provided on this After Visit Summary.  MyChart is used to connect with patients for Virtual Visits (Telemedicine).  Patients are able to view lab/test results, encounter notes, upcoming appointments, etc.  Non-urgent messages can be sent to your provider as well.   To learn more about what you can do with MyChart, go to [email or web address].com.    Your next appointment:   5 month(s)  The format for your next appointment:   In Person  Provider:   Robert Krasowski, MD   Other Instructions   

## 2020-08-01 NOTE — Addendum Note (Signed)
Addended by: Senaida Ores on: 08/01/2020 04:55 PM   Modules accepted: Orders

## 2020-08-05 ENCOUNTER — Telehealth (HOSPITAL_COMMUNITY): Payer: Self-pay | Admitting: *Deleted

## 2020-08-05 NOTE — Telephone Encounter (Signed)
Patient given detailed instructions per Myocardial Perfusion Study Information Sheet for the test on 08/06/20. Patient notified to arrive 15 minutes early and that it is imperative to arrive on time for appointment to keep from having the test rescheduled.  If you need to cancel or reschedule your appointment, please call the office within 24 hours of your appointment. . Patient verbalized understanding. Kirstie Peri

## 2020-08-06 ENCOUNTER — Other Ambulatory Visit: Payer: Self-pay

## 2020-08-06 ENCOUNTER — Ambulatory Visit (INDEPENDENT_AMBULATORY_CARE_PROVIDER_SITE_OTHER): Payer: PPO

## 2020-08-06 DIAGNOSIS — I25118 Atherosclerotic heart disease of native coronary artery with other forms of angina pectoris: Secondary | ICD-10-CM | POA: Diagnosis not present

## 2020-08-06 DIAGNOSIS — E782 Mixed hyperlipidemia: Secondary | ICD-10-CM

## 2020-08-06 DIAGNOSIS — Z01818 Encounter for other preprocedural examination: Secondary | ICD-10-CM

## 2020-08-06 DIAGNOSIS — I10 Essential (primary) hypertension: Secondary | ICD-10-CM | POA: Diagnosis not present

## 2020-08-06 LAB — MYOCARDIAL PERFUSION IMAGING
LV dias vol: 78 mL (ref 62–150)
LV sys vol: 30 mL
Peak HR: 82 {beats}/min
Rest HR: 64 {beats}/min
SDS: 5
SRS: 1
SSS: 6
TID: 1.01

## 2020-08-06 MED ORDER — TECHNETIUM TC 99M TETROFOSMIN IV KIT
32.3000 | PACK | Freq: Once | INTRAVENOUS | Status: AC | PRN
  Administered 2020-08-06: 32.3 via INTRAVENOUS

## 2020-08-06 MED ORDER — TECHNETIUM TC 99M TETROFOSMIN IV KIT
10.9000 | PACK | Freq: Once | INTRAVENOUS | Status: AC | PRN
  Administered 2020-08-06: 10.9 via INTRAVENOUS

## 2020-08-06 MED ORDER — REGADENOSON 0.4 MG/5ML IV SOLN
0.4000 mg | Freq: Once | INTRAVENOUS | Status: AC
  Administered 2020-08-06: 0.4 mg via INTRAVENOUS

## 2020-08-06 NOTE — Addendum Note (Signed)
Addended by: Jenne Campus on: 08/06/2020 08:06 AM   Modules accepted: Orders

## 2020-08-22 ENCOUNTER — Other Ambulatory Visit: Payer: Self-pay | Admitting: Cardiology

## 2020-09-16 ENCOUNTER — Telehealth: Payer: Self-pay | Admitting: Cardiology

## 2020-09-16 NOTE — Telephone Encounter (Signed)
Left VM that records have been faxed to Dr. Vertell Limber as requested.

## 2020-09-16 NOTE — Telephone Encounter (Signed)
Butch Penny is calling wanting to know if Dr. Vertell Limber from [facility] has contacted the office and requested Dawsyn's most recent stress test results. She states he is needing them before an upcoming appointment. Please advise.

## 2020-09-20 ENCOUNTER — Telehealth: Payer: Self-pay | Admitting: Emergency Medicine

## 2020-09-20 NOTE — Telephone Encounter (Signed)
   Primary Cardiologist: Jenne Campus, MD  Chart reviewed as part of pre-operative protocol coverage. Given past medical history and time since last visit, based on ACC/AHA guidelines, Robert Ramos would be at acceptable risk for the planned procedure without further cardiovascular testing.   His RCRI is a class II risk, 0.9% risk of major cardiac event.  Per Dr. Wendy Poet aspirin and Plavix may be held for 7-10 days prior to surgery.  Please resume as soon as hemostasis is achieved.  I will route this recommendation to the requesting party via Epic fax function and remove from pre-op pool.  Please call with questions.  Jossie Ng. Anasha Perfecto NP-C    09/20/2020, 1:51 PM [facility] [place] [address] Office [phone] Fax [phone]

## 2020-09-20 NOTE — Telephone Encounter (Signed)
   [facility] Pre-operative Risk Assessment    Patient Name: Robert Ramos  DOB: Mar 29, 1951  MRN: [id number]   HEARTCARE STAFF: - Please ensure there is not already an duplicate clearance open for this procedure. - Under Visit Info/Reason for Call, type in Other and utilize the format Clearance MM/DD/YY or Clearance TBD. Do not use dashes or single digits. - If request is for dental extraction, please clarify the # of teeth to be extracted. - If the patient is currently at the dentist's office, call Pre-Op APP to address. If the patient is not currently in the dentist office, please route to the Pre-Op pool  Request for surgical clearance:  What type of surgery is being performed? L4 L5 S1 Posterior lumbar interbody fusion    When is this surgery scheduled? TBD   What type of clearance is required (medical clearance vs. Pharmacy clearance to hold med vs. Both)? BOTH  Are there any medications that need to be held prior to surgery and how long? Aspirin, plavix, brilinta, effient, gilostazol, zonitivity, eliquis, xarelto, pradaxa,savaysa, warfarin and how many days before surgery?   "Patient is on plavix and aspirin and will need to hold prior to surgery"   Practice name and name of physician performing surgery? [facility]   What is the office phone number? [phone]   7.   What is the office fax number? [phone]  8.   Anesthesia type (None, local, MAC, general) ? General    Senaida Ores 09/20/2020, 7:58 AM  _________________________________________________________________   (provider comments below)

## 2020-09-20 NOTE — Telephone Encounter (Signed)
Robert Ramos 70 year old male is requesting L4-5 S1 posterior lumbar interbody fusion.  He was last seen in the clinic on 08/01/2020.  During that time his main complaint was with his impaired mobility related to his back.  A nuclear stress test was ordered and showed an EF of 62%, no ST segment deviation, a medium defect of mild severity in the mid inferior, apical inferior and apical lateral location.  It was considered an intermediate risk study.  Felt he would be an intermediate risk for back surgery.  His PMH also includes cardiac catheterization with PCI of his LAD in 2018, PCI of posterior lateral branch of RCA 5/19, HTN, HLD.  He also underwent cardiac catheterization by Dr. Martinique which showed CTO of his RCA.  Intervention was unsuccessful.  Medical management was recommended.  May his aspirin and Plavix be held prior to his surgery?  Thank you for your help.  Please direct response to CV DIV preop pool.  Jossie Ng. [name] NP-C    09/20/2020, 8:29 AM [place] [address] Office [phone] Fax [phone]

## 2020-10-08 ENCOUNTER — Other Ambulatory Visit: Payer: Self-pay | Admitting: Internal Medicine

## 2020-10-23 ENCOUNTER — Other Ambulatory Visit: Payer: Self-pay | Admitting: Neurosurgery

## 2020-11-22 ENCOUNTER — Other Ambulatory Visit: Payer: Self-pay | Admitting: Cardiology

## 2020-11-25 DIAGNOSIS — M7551 Bursitis of right shoulder: Secondary | ICD-10-CM | POA: Diagnosis not present

## 2020-11-25 DIAGNOSIS — M25511 Pain in right shoulder: Secondary | ICD-10-CM | POA: Diagnosis not present

## 2020-11-25 DIAGNOSIS — M25512 Pain in left shoulder: Secondary | ICD-10-CM | POA: Diagnosis not present

## 2020-11-26 NOTE — H&P (Signed)
Patient ID:   [id number] Patient: Robert Ramos  Date of Birth: 1950-06-12 Visit Type: Office Visit   Date: 09/18/2020 08:45 AM Provider: Marchia Meiers. Vertell Limber MD   This 70 year old male presents for back pain.  HISTORY OF PRESENT ILLNESS: 1.  back pain  Patient returns for review of MRI.  [For purposes of this visit, note [employer] entry is for [employer].  Misspelled name will be corrected by Murphy-Wainer/[facility]opy]  The patient returns today and says that his pain is worse.  It is currently 8/10 in severity.  His MRI demonstrates progressive worsening at the L4-5 and L5-S1 levels with a combination of disc herniations, caudally migrated disc material at the L4-5 level and bilateral foraminal stenosis at the L4-5 and L5-S1 levels.  While worse than his previous MRIs these are not substantially different and there are no additional levels that are involved.  The patient has been trying to manage his pain with conservative management including medications and injections but this is no longer working.  He has gone through a stress test but we are waiting for cardiology clearance to proceed with surgery  The patient wishes to go ahead with surgery given his poor level of function and inability to get relief of his pain.  He continues to demonstrate weakness in his right leg particularly with dorsiflexion and extensor hallucis longus and has a positive straight leg raise on the right > on the left      Medical/Surgical/Interim History Reviewed, no change.     PAST MEDICAL HISTORY, SURGICAL HISTORY, FAMILY HISTORY, SOCIAL HISTORY AND REVIEW OF SYSTEMS I have reviewed the patient's past medical, surgical, family and social history as well as the comprehensive review of systems as included on the [facility] history form dated 01/22/2020, which I have signed.  Family History: Reviewed, no changes.    Social History: Reviewed, no changes. Last detailed  document date: 11/04/2016.    MEDICATIONS: (added, continued or stopped this visit) Started Medication Directions Instruction Stopped 06/17/2018 allopurinol 100 mg tablet    07/27/2018 amlodipine 5 mg tablet     aspirin 81 mg tablet,delayed release take 1 tablet by oral route  every day   02/12/2020 gabapentin 300 mg capsule take 1 capsule by oral route 3 times every day   09/12/2018 isosorbide mononitrate ER 120 mg tablet,extended release 24 hr    11/24/2017 lansoprazole 30 mg capsule,delayed release     lisinopril 2.5 mg tablet take 1 tablet by oral route  every day    metoprolol succinate ER 50 mg tablet,extended release 24 hr take 1 tablet by oral route  every day   08/26/2020 morphine 15 mg immediate release tablet take 1 tablet by oral route  q 6-8 hours as needed for severe LBP   08/26/2018 nitroglycerin 0.4 mg sublingual tablet     Plavix 75 mg tablet take 1 tablet by oral route  every day   08/01/2018 ranolazine ER 1,000 mg tablet,extended release,12 hr     rosuvastatin 40 mg tablet take 1 tablet by oral route  every day    simvastatin 80 mg tablet take 1 tablet by oral route  every day    Vascepa take 1 capsule by oral route 2 times every day with food swallowing whole. Do not chew, open, dissolve and/or crush.      ALLERGIES: Ingredient Reaction Medication Name Comment PENICILLINS Anaphylaxis    Reviewed, no changes.    PHYSICAL EXAM:  Vitals Date Temp F  BP Pulse Ht In Wt Lb BMI BSA Pain Score 09/18/2020  106/71 68 65 189 31.45  8/10     IMPRESSION:  I have recommended that the patient undergo redo decompression with fusion at the L4-5 and L5-S1 levels pending cardiology clearance.  I met with his nurse case manager and the patient and his wife and discussed the situation and my recommendations in great detail.  Patient is on an anti-coagulant, anti-inflammatory or supplement  that may increase bleeding time. Patient advised to stop medicine prior to surgery.  PLAN: Proceed with L4-5 and L5-S1 redo decompression with fusion pending cardiology clearance.  Nurse education was performed today.  Orders: Instruction(s)/Education: Assessment Instruction Z68.31 Dietary management education, guidance, and counseling Miscellaneous: Assessment  M48.062 LSO Brace  Completed Orders (this encounter) Order Details Reason Side Interpretation Result Initial Treatment Date Region Dietary management education, guidance, and counseling Encouraged patient to eat well balanced diet.        Assessment/Plan  # Detail Type Description  1. Assessment Disc displacement, lumbar (M51.26).     2. Assessment Radiculopathy, lumbar region (M54.16).     3. 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     4. Assessment Lumbar foraminal stenosis (M48.061).     5. 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  Plan Orders LSO Brace.     6. Assessment Body mass index (BMI) 31.0-31.9, adult (Z68.31).  Plan Orders Today's instructions / counseling include(s) Dietary management education, guidance, and counseling. Clinical information/comments: Encouraged patient to eat well balanced diet.       Pain Management Plan Pain Scale: 8/10. Method: Numeric Pain Intensity Scale. Location: back. Onset: 06/17/2016. Duration: varies. Quality: discomforting. Pain management follow-up plan of care: Patient will continue medication management..              Provider:  Marchia Meiers. Vertell Limber MD  09/20/2020 08:41 AM    Dictation edited by: Marchia Meiers. Vertell Limber    CC Providers: Gilford Rile [facility] [place] [place],  [place]  [id number]-   Robert Brown  [address] [place] [address]-               Electronically signed by  Marchia Meiers Vertell Limber MD on 09/20/2020 08:41 AM

## 2020-11-28 ENCOUNTER — Other Ambulatory Visit: Payer: Self-pay | Admitting: Specialist

## 2020-11-28 DIAGNOSIS — E782 Mixed hyperlipidemia: Secondary | ICD-10-CM | POA: Diagnosis not present

## 2020-11-28 DIAGNOSIS — N1831 Chronic kidney disease, stage 3a: Secondary | ICD-10-CM | POA: Diagnosis not present

## 2020-11-28 DIAGNOSIS — I129 Hypertensive chronic kidney disease with stage 1 through stage 4 chronic kidney disease, or unspecified chronic kidney disease: Secondary | ICD-10-CM | POA: Diagnosis not present

## 2020-11-28 DIAGNOSIS — Z79899 Other long term (current) drug therapy: Secondary | ICD-10-CM | POA: Diagnosis not present

## 2020-11-28 DIAGNOSIS — N200 Calculus of kidney: Secondary | ICD-10-CM | POA: Diagnosis not present

## 2020-11-28 DIAGNOSIS — R251 Tremor, unspecified: Secondary | ICD-10-CM

## 2020-11-28 DIAGNOSIS — J3089 Other allergic rhinitis: Secondary | ICD-10-CM | POA: Diagnosis not present

## 2020-11-28 DIAGNOSIS — G4733 Obstructive sleep apnea (adult) (pediatric): Secondary | ICD-10-CM | POA: Diagnosis not present

## 2020-11-28 DIAGNOSIS — E291 Testicular hypofunction: Secondary | ICD-10-CM | POA: Diagnosis not present

## 2020-11-28 DIAGNOSIS — R7303 Prediabetes: Secondary | ICD-10-CM | POA: Diagnosis not present

## 2020-11-28 DIAGNOSIS — M961 Postlaminectomy syndrome, not elsewhere classified: Secondary | ICD-10-CM | POA: Diagnosis not present

## 2020-11-28 DIAGNOSIS — R5383 Other fatigue: Secondary | ICD-10-CM | POA: Diagnosis not present

## 2020-11-28 DIAGNOSIS — R5381 Other malaise: Secondary | ICD-10-CM | POA: Diagnosis not present

## 2020-11-28 DIAGNOSIS — D649 Anemia, unspecified: Secondary | ICD-10-CM | POA: Diagnosis not present

## 2020-11-28 DIAGNOSIS — I2510 Atherosclerotic heart disease of native coronary artery without angina pectoris: Secondary | ICD-10-CM | POA: Diagnosis not present

## 2020-11-28 NOTE — Pre-Procedure Instructions (Signed)
Surgical Instructions   Your procedure is scheduled on Friday, August 26th. Report to [facility] Main Entrance "A" at 05:30 A.M., then check in with the Admitting office. Call this number if you have problems the morning of surgery: [phone]   If you have any questions prior to your surgery date call [phone]: Open Monday-Friday 8am-4pm   Remember: Do not eat after midnight the night before your surgery  You may drink clear liquids until 04:30 the morning of your surgery.   Clear liquids allowed are: Water, Non-Citrus Juices (without pulp), Carbonated Beverages, Clear Tea, Black Coffee Only, and Gatorade   Take these medicines the morning of surgery with A SIP OF WATER  allopurinol (ZYLOPRIM) amLODipine (NORVASC)  gabapentin (NEURONTIN) isosorbide mononitrate (IMDUR) metoprolol tartrate (LOPRESSOR) ranolazine (RANEXA)  rosuvastatin (CRESTOR)   As of today, STOP taking any Aspirin (unless otherwise instructed by your surgeon) Aleve, Naproxen, Ibuprofen, Motrin, Advil, Goody's, BC's, all herbal medications, fish oil, and all vitamins.          Do not wear jewelry  Do not wear lotions, powders, colognes, or deodorant. Men may shave face and neck. Do not bring valuables to the hospital. [facility] is not responsible for any belongings or valuables.          Do NOT Smoke (Tobacco/Vaping) or drink Alcohol 24 hours prior to your procedure If you use a CPAP at night, you may bring all equipment for your overnight stay.   Contacts, glasses, dentures or bridgework may not be worn into surgery, please bring cases for these belongings   For patients admitted to the hospital, discharge time will be determined by your treatment team.   Patients discharged the day of surgery will not be allowed to drive home, and someone needs to stay with them for 24 hours.  ONLY 1 SUPPORT PERSON MAY BE PRESENT WHILE YOU ARE IN SURGERY. IF YOU ARE TO BE ADMITTED ONCE YOU ARE IN YOUR ROOM YOU WILL  BE ALLOWED TWO (2) VISITORS.  Minor children may have two parents present. Special consideration for safety and communication needs will be reviewed on a case by case basis.   Special instructions:  Oral Hygiene is also important to reduce your risk of infection.  Remember - BRUSH YOUR TEETH THE MORNING OF SURGERY WITH YOUR REGULAR TOOTHPASTE   [place]- Preparing For Surgery  Before surgery, you can play an important role. Because skin is not sterile, your skin needs to be as free of germs as possible. You can reduce the number of germs on your skin by washing with CHG (chlorahexidine gluconate) Soap before surgery.  CHG is an antiseptic cleaner which kills germs and bonds with the skin to continue killing germs even after washing.     Please do not use if you have an allergy to CHG or antibacterial soaps. If your skin becomes reddened/irritated stop using the CHG.  Do not shave (including legs and underarms) for at least 48 hours prior to first CHG shower. It is OK to shave your face.  Please follow these instructions carefully.     Shower the NIGHT BEFORE SURGERY and the MORNING OF SURGERY with CHG Soap.   If you chose to wash your hair, wash your hair first as usual with your normal shampoo. After you shampoo, rinse your hair and body thoroughly to remove the shampoo.  Then [employer] and genitals (private parts) with your normal soap and rinse thoroughly to remove soap.  After that Use CHG  Soap as you would any other liquid soap. You can apply CHG directly to the skin and wash gently with a scrungie or a clean washcloth.   Apply the CHG Soap to your body ONLY FROM THE NECK DOWN.  Do not use on open wounds or open sores. Avoid contact with your eyes, ears, mouth and genitals (private parts). Wash Face and genitals (private parts)  with your normal soap.   Wash thoroughly, paying special attention to the area where your surgery will be performed.  Thoroughly rinse your body with warm  water from the neck down.  DO NOT shower/wash with your normal soap after using and rinsing off the CHG Soap.  Pat yourself dry with a CLEAN TOWEL.  Wear CLEAN PAJAMAS to bed the night before surgery  Place CLEAN SHEETS on your bed the night before your surgery  DO NOT SLEEP WITH PETS.   Day of Surgery:  Take a shower with CHG soap. Wear Clean/Comfortable clothing the morning of surgery Do not apply any deodorants/lotions.   Remember to brush your teeth WITH YOUR REGULAR TOOTHPASTE.   Please read over the following fact sheets that you were given.

## 2020-11-29 ENCOUNTER — Ambulatory Visit
Admission: RE | Admit: 2020-11-29 | Discharge: 2020-11-29 | Disposition: A | Discharge status: Home / Self Care | Payer: PPO | Source: Ambulatory Visit | Attending: Specialist | Admitting: Specialist

## 2020-11-29 ENCOUNTER — Encounter (HOSPITAL_COMMUNITY): Payer: Self-pay

## 2020-11-29 ENCOUNTER — Other Ambulatory Visit: Payer: Self-pay

## 2020-11-29 ENCOUNTER — Encounter (HOSPITAL_COMMUNITY)
Admission: RE | Admit: 2020-11-29 | Discharge: 2020-11-29 | Disposition: A | Discharge status: Home / Self Care | Payer: No Typology Code available for payment source | Source: Ambulatory Visit | Attending: Neurosurgery | Admitting: Neurosurgery

## 2020-11-29 DIAGNOSIS — G319 Degenerative disease of nervous system, unspecified: Secondary | ICD-10-CM | POA: Diagnosis not present

## 2020-11-29 DIAGNOSIS — L989 Disorder of the skin and subcutaneous tissue, unspecified: Secondary | ICD-10-CM

## 2020-11-29 DIAGNOSIS — Z01812 Encounter for preprocedural laboratory examination: Secondary | ICD-10-CM | POA: Insufficient documentation

## 2020-11-29 DIAGNOSIS — J329 Chronic sinusitis, unspecified: Secondary | ICD-10-CM | POA: Diagnosis not present

## 2020-11-29 DIAGNOSIS — J341 Cyst and mucocele of nose and nasal sinus: Secondary | ICD-10-CM | POA: Diagnosis not present

## 2020-11-29 DIAGNOSIS — R251 Tremor, unspecified: Secondary | ICD-10-CM

## 2020-11-29 HISTORY — DX: Disorder of the skin and subcutaneous tissue, unspecified: L98.9

## 2020-11-29 HISTORY — DX: Pneumonia, unspecified organism: J18.9

## 2020-11-29 HISTORY — DX: Prediabetes: R73.03

## 2020-11-29 LAB — CBC
HCT: 40.5 % (ref 39.0–52.0)
Hemoglobin: 13.1 g/dL (ref 13.0–17.0)
MCH: 29.7 pg (ref 26.0–34.0)
MCHC: 32.3 g/dL (ref 30.0–36.0)
MCV: 91.8 fL (ref 80.0–100.0)
Platelets: 211 10*3/uL (ref 150–400)
RBC: 4.41 MIL/uL (ref 4.22–5.81)
RDW: 13.5 % (ref 11.5–15.5)
WBC: 7.2 10*3/uL (ref 4.0–10.5)
nRBC: 0 % (ref 0.0–0.2)

## 2020-11-29 LAB — COMPREHENSIVE METABOLIC PANEL
ALT: 34 U/L (ref 0–44)
AST: 33 U/L (ref 15–41)
Albumin: 3.9 g/dL (ref 3.5–5.0)
Alkaline Phosphatase: 56 U/L (ref 38–126)
Anion gap: 8 (ref 5–15)
BUN: 17 mg/dL (ref 8–23)
CO2: 24 mmol/L (ref 22–32)
Calcium: 9.3 mg/dL (ref 8.9–10.3)
Chloride: 104 mmol/L (ref 98–111)
Creatinine, Ser: 1.08 mg/dL (ref 0.61–1.24)
GFR, Estimated: >60 mL/min (ref >60)
Glucose, Bld: 127 mg/dL — ABNORMAL HIGH (ref 70–99)
Potassium: 4.4 mmol/L (ref 3.5–5.1)
Sodium: 136 mmol/L (ref 135–145)
Total Bilirubin: 0.3 mg/dL (ref 0.3–1.2)
Total Protein: 7.8 g/dL (ref 6.5–8.1)

## 2020-11-29 LAB — SURGICAL PCR SCREEN
MRSA, PCR: NEGATIVE
Staphylococcus aureus: NEGATIVE

## 2020-11-29 LAB — TYPE AND SCREEN
ABO/RH(D): O POS
Antibody Screen: NEGATIVE

## 2020-11-29 IMAGING — MR MR HEAD WO/W CM
13 series · 48 of 48 positions shown · IV contrast (17ml multihance)
Comparison: No pertinent prior exams available for comparison.

CLINICAL DATA: Tremor [I8] ([I8]-CM). Additional history
provided: Symptoms for 1 year.

EXAM:
MRI HEAD WITHOUT AND WITH CONTRAST
TECHNIQUE: Multiplanar, multiecho pulse sequences of the brain and surrounding
structures were obtained without and with intravenous contrast.
CONTRAST:  17mL MULTIHANCE GADOBENATE DIMEGLUMINE 529 MG/ML IV SOLN

[Series 2: T1 · sagittal · 5.0mm · 0.45mm/px · 1 of 22 slices shown]
[im 1/22]
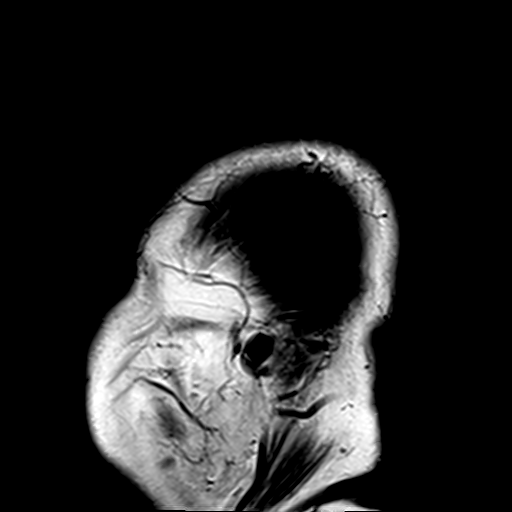

[Series 3: DWI · axial · 3.0mm · 1.80mm/px · z∈[-48,+95]mm · 7 of 100 slices shown (1 of 4)]
[im 1/100]
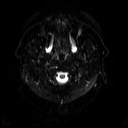
[im 17/100]
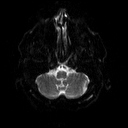
[im 34/100]
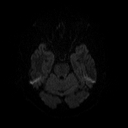
[im 50/100]
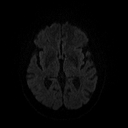
[im 67/100]
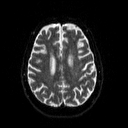
[im 83/100]
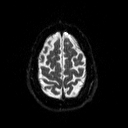
[im 100/100]
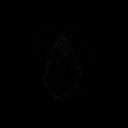

[Series 4: DWI · axial · 3.0mm · 1.80mm/px · z∈[-48,+95]mm · 3 of 48 slices shown (2 of 4)]
[im 1/48]
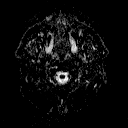
[im 24/48]
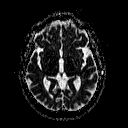
[im 48/48]
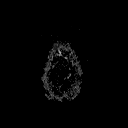

[Series 5: DWI · coronal · 5.0mm · 1.80mm/px · 4 of 68 slices shown (3 of 4)]
[im 1/68]
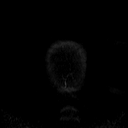
[im 23/68]
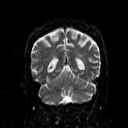
[im 45/68]
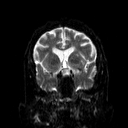
[im 68/68]
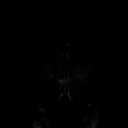

[Series 6: DWI · coronal · 5.0mm · 1.80mm/px · 2 of 34 slices shown (4 of 4)]
[im 1/34]
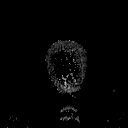
[im 34/34]
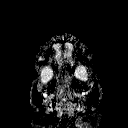

[Series 7: T2 · axial · 5.0mm · 0.60mm/px · 1 of 22 slices shown (1 of 2)]
[im 1/22]
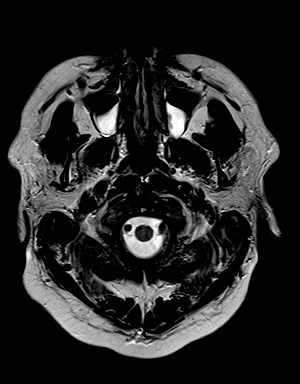

[Series 8: FLAIR · axial · 3.0mm · 0.45mm/px · z∈[-42,+89]mm · 2 of 30 slices shown]
[im 1/30]
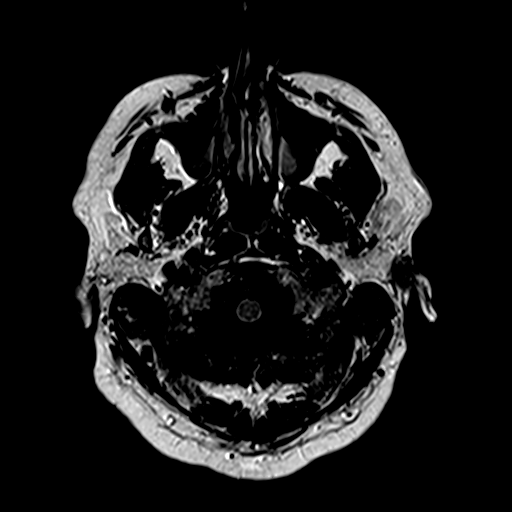
[im 30/30]
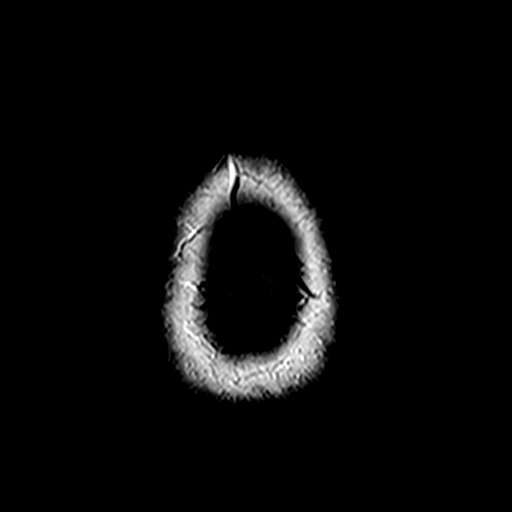

[Series 9: mip_images(sw) · axial · 32.0mm · 0.90mm/px · z∈[-31,+78]mm · 2 of 29 slices shown]
[im 1/29]
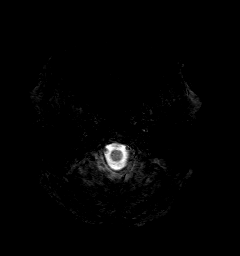
[im 29/29]
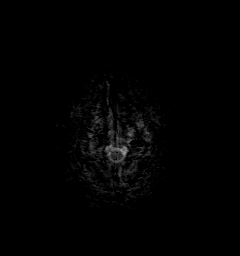

[Series 10: swi_images · axial · 4.0mm · 0.90mm/px · z∈[-45,+92]mm · 2 of 36 slices shown]
[im 1/36]
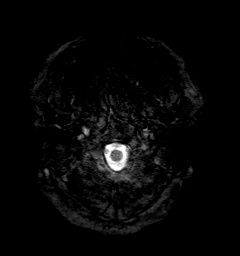
[im 36/36]
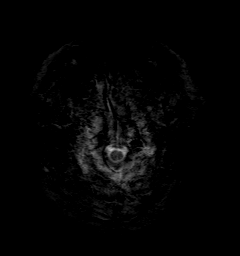

[Series 11: t1_mpr_tra · axial · 1.0mm · 0.75mm/px · z∈[-44,+95]mm · 10 of 144 slices shown]
[im 1/144]
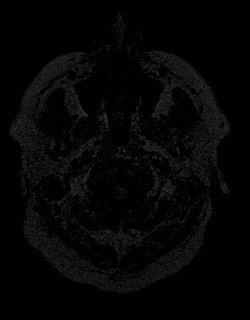
[im 16/144]
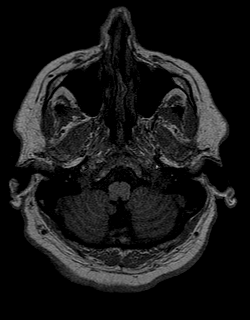
[im 32/144]
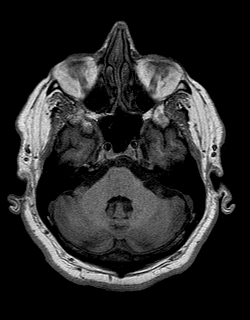
[im 48/144]
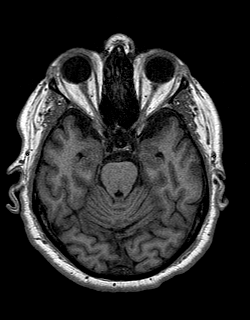
[im 64/144]
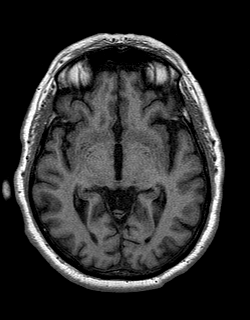
[im 80/144]
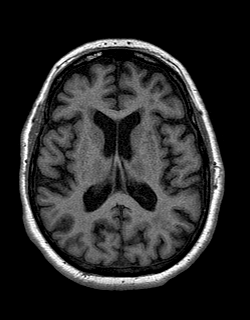
[im 96/144]
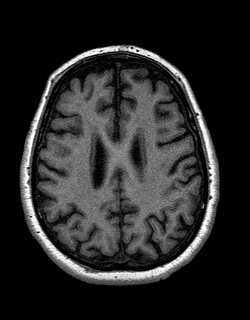
[im 112/144]
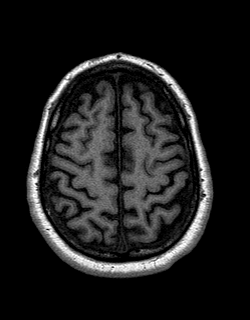
[im 128/144]
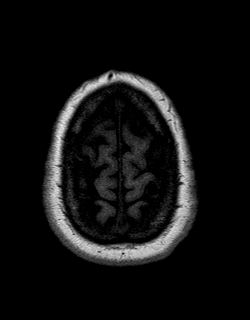
[im 144/144]
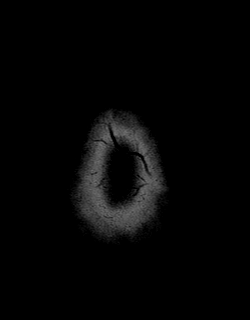

[Series 12: T2 · coronal · 5.0mm · 0.45mm/px · 2 of 25 slices shown (2 of 2)]
[im 1/25]
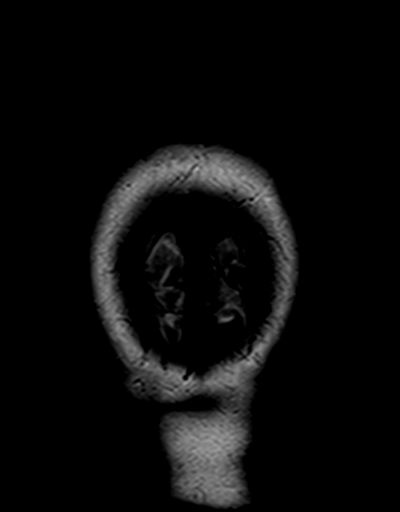
[im 25/25]
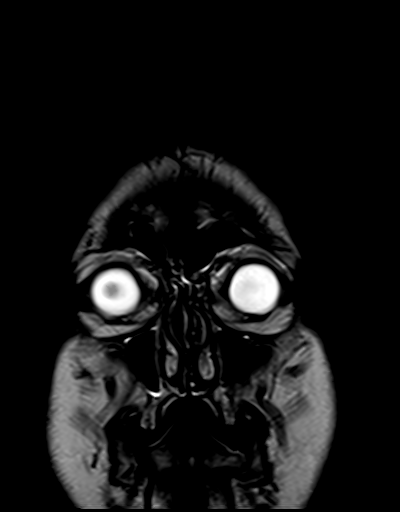

[Series 13: t1_mpr_tra post · axial · 1.0mm · 0.75mm/px · z∈[-44,+95]mm · 10 of 144 slices shown]
[im 1/144]
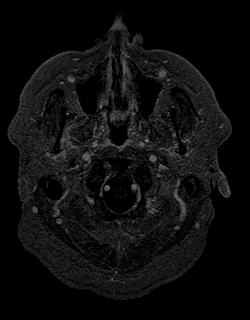
[im 16/144]
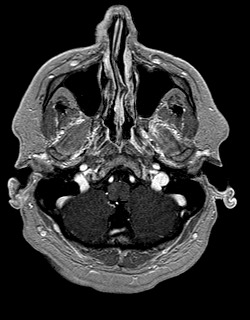
[im 32/144]
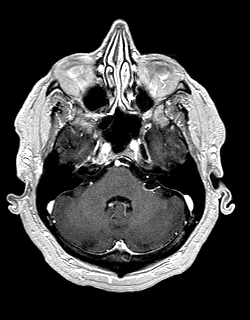
[im 48/144]
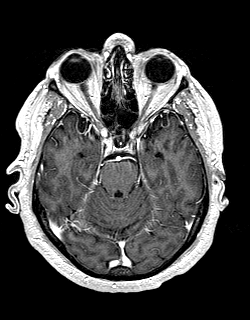
[im 64/144]
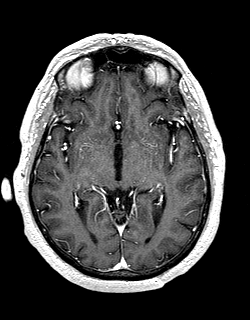
[im 80/144]
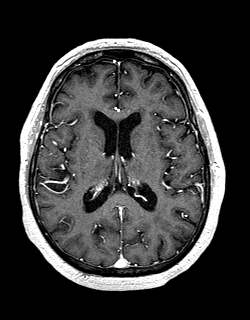
[im 96/144]
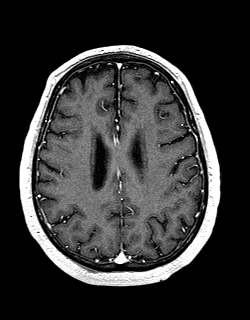
[im 112/144]
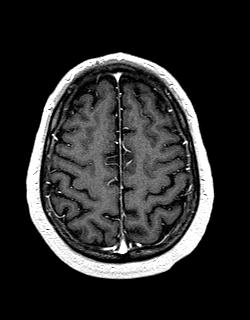
[im 128/144]
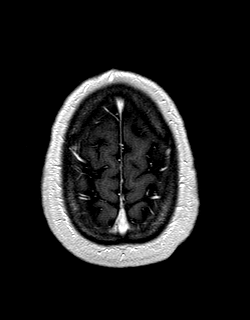
[im 144/144]
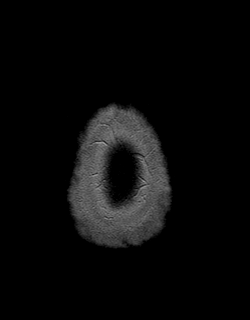

[Series 14: post cor · coronal · 5.0mm · 0.45mm/px · 2 of 25 slices shown]
[im 1/25]
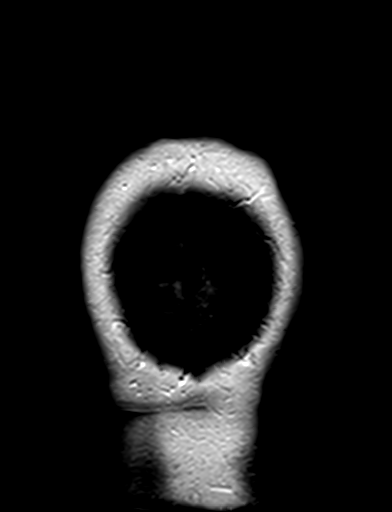
[im 25/25]
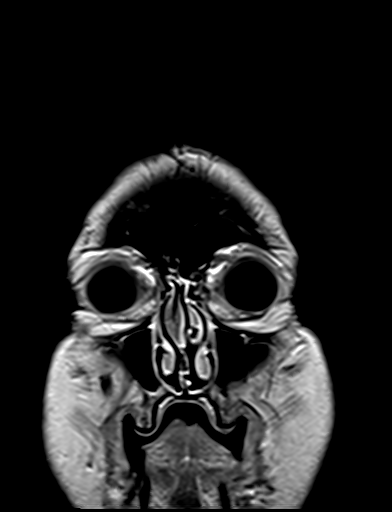

[48 of 48 positions shown; findings below may reference images not displayed]

FINDINGS: Brain:

Mild generalized cerebral and cerebellar atrophy.

Mild-to-moderate for age multifocal T2/FLAIR hyperintensity within
the cerebral white matter, nonspecific but compatible with chronic
small vessel ischemic disease. Mild chronic small vessel ischemic
changes are also present within the pons.

Chronic lacunar infarct versus prominent perivascular space within
the right lentiform nucleus (series 7, image 12).

There is no acute infarct.

No evidence of an intracranial mass.

No chronic intracranial blood products.

No extra-axial fluid collection.

No midline shift.

No pathologic intracranial enhancement.

Vascular: Maintained flow voids within the proximal large arterial
vessels.

Skull and upper cervical spine: No focal marrow lesion.

Sinuses/Orbits: Visualized orbits show no acute finding. Trace
mucosal thickening and 19 mm mucous retention cysts within the
bilateral maxillary sinuses. Additional small mucous retention cyst
within the left maxillary sinus. Trace mucosal thickening within the
bilateral ethmoid air cells.
IMPRESSION: No evidence of acute intracranial abnormality.

Chronic small-vessel ischemic changes which are mild-to-moderate in
the cerebral white matter, and mild in the pons.

Chronic lacunar infarct versus prominent perivascular space within
the right basal ganglia.

Mild generalized cerebral and cerebellar atrophy.

Paranasal sinus disease, as described.

## 2020-11-29 MED ORDER — GADOBENATE DIMEGLUMINE 529 MG/ML IV SOLN
17.0000 mL | Freq: Once | INTRAVENOUS | Status: AC | PRN
  Administered 2020-11-29: 17 mL via INTRAVENOUS

## 2020-11-29 NOTE — Progress Notes (Addendum)
PCP - Dr. Gilford Rile Cardiologist - Dr. Jenne Campus  PPM/ICD - denies   Chest x-ray - 09/15/17 EKG - 08/02/20 Stress Test - 08/06/20 ECHO - 02/19/20 Cardiac Cath - 02/21/18 (most recent). Pt has had multiple cardiac catheterizations  Sleep Study - years ago, per pt CPAP - pt does not use  DM: pre-diabetic, does not check blood sugar (HgbA1c in normal range)  Blood Thinner/ASA Instructions: Hold ASA and Plavix 7-10 days prior (pt has already stopped taking)   ERAS Protcol - yes, no drink   COVID TEST- pt instructed to go to testing center on 8/23 or 8/24   Anesthesia review: yes, cardiac hx  Patient denies shortness of breath, fever, cough and chest pain at PAT appointment   All instructions explained to the patient, with a verbal understanding of the material. Patient agrees to go over the instructions while at home for a better understanding. The opportunity to ask questions was provided.

## 2020-12-02 NOTE — Progress Notes (Signed)
Anesthesia Chart Review:  Follows with cardiology for history of coronary artery disease status post PCI of the LAD in 2018, status post PCI of the posterior lateral branch of the RCA in May 2019, hypertension, hyperlipidemia.  Recent preop evaluation with Dr. Agustin Cree.  Preop stress test was ordered.  Dr. Agustin Cree commented on result 08/07/2020 stating, "Ischemia consistent with her known anatomy with completely occluded right coronary artery.  Should be fine to proceed with back surgery with understanding that he does carry at least intermediate risks.  Patient is very much aware of the".  Formal cardiac clearance per telephone encounter 09/20/2020, "Chart reviewed as part of pre-operative protocol coverage. Given past medical history and time since last visit, based on ACC/AHA guidelines, Robert Ramos would be at acceptable risk for the planned procedure without further cardiovascular testing. His RCRI is a class II risk, 0.9% risk of major cardiac event. Per Dr. Wendy Poet aspirin and Plavix may be held for 7-10 days prior to surgery.  Please resume as soon as hemostasis is achieved."  Preop labs reviewed, unremarkable.  EKG 08/02/2020: Sinus rhythm.  Rate 60.  Possible inferior infarct, age undetermined.  Nuclear stress 08/06/2020: The left ventricular ejection fraction is normal (55-65%). Nuclear stress EF: 62%. There was no ST segment deviation noted during stress. Defect 1: There is a medium defect of mild severity present in the mid inferior, apical inferior and apical lateral location. Findings consistent with ischemia. This is an intermediate risk study.  TTE 02/19/2020:  1. Left ventricular ejection fraction, by estimation, is 60 to 65%. The  left ventricle has normal function. The left ventricle has no regional  wall motion abnormalities. Left ventricular diastolic parameters were  normal.   2. Right ventricular systolic function is normal. The right ventricular  size is  normal. There is normal pulmonary artery systolic pressure.   3. The mitral valve is normal in structure. Trivial mitral valve  regurgitation. No evidence of mitral stenosis.   4. The aortic valve is normal in structure. There is mild calcification  of the aortic valve. Aortic valve regurgitation is not visualized. No  aortic stenosis is present.   5. There is mild dilatation of the aortic root, measuring 37 mm.   6. The inferior vena cava is normal in size with greater than 50%  respiratory variability, suggesting right atrial pressure of 3 mmHg.    Wynonia Musty [facility] Short Stay Center/Anesthesiology Phone [phone] 12/02/2020 4:14 PM

## 2020-12-02 NOTE — Anesthesia Preprocedure Evaluation (Addendum)
Anesthesia Evaluation  Patient identified by MRN, date of birth, ID band Patient awake    Reviewed: Allergy & Precautions, NPO status , Patient's Chart, lab work & pertinent test results, reviewed documented beta blocker date and time   Airway Mallampati: I       Dental no notable dental hx.    Pulmonary former smoker,    Pulmonary exam normal        Cardiovascular hypertension, Pt. on medications and Pt. on home beta blockers + CAD, + Past MI and + Cardiac Stents  Normal cardiovascular exam     Neuro/Psych negative psych ROS   GI/Hepatic negative GI ROS, Neg liver ROS,   Endo/Other    Renal/GU      Musculoskeletal   Abdominal Normal abdominal exam  (+)   Peds  Hematology   Anesthesia Other Findings ? The left ventricular ejection fraction is normal (55-65%). ? Nuclear stress EF: 62%. ? There was no ST segment deviation noted during stress. ? Defect 1: There is a medium defect of mild severity present in the mid inferior, apical inferior and apical lateral location. ? Findings consistent with ischemia. ? This is an intermediate risk study.     Reproductive/Obstetrics                           Anesthesia Physical Anesthesia Plan  ASA: 3  Anesthesia Plan: General   Post-op Pain Management:    Induction: Intravenous  PONV Risk Score and Plan: 3 and Ondansetron, Dexamethasone and Midazolam  Airway Management Planned: Oral ETT  Additional Equipment: None  Intra-op Plan:   Post-operative Plan: Extubation in OR  Informed Consent: I have reviewed the patients History and Physical, chart, labs and discussed the procedure including the risks, benefits and alternatives for the proposed anesthesia with the patient or authorized representative who has indicated his/her understanding and acceptance.     Dental advisory given  Plan Discussed with: CRNA  Anesthesia Plan Comments: (PAT  note by Karoline Caldwell, PA-C: Follows with cardiology for history of coronary artery disease status post PCI of the LAD in 2018, status post PCI of the posterior lateral branch of the RCA in May 2019, hypertension, hyperlipidemia.  Recent preop evaluation with Dr. Agustin Cree.  Preop stress test was ordered.  Dr. Agustin Cree commented on result 08/07/2020 stating, "Ischemia consistent with her known anatomy with completely occluded right coronary artery. Should be fine to proceed with back surgery with understanding that he does carry at least intermediate risks. Patient is very much aware of the".  Formal cardiac clearance per telephone encounter 09/20/2020, "Chart reviewed as part of pre-operative protocol coverage. Given past medical history and time since last visit, based on ACC/AHA guidelines,Cartel L Applegetwould be at acceptable risk for the planned procedure without further cardiovascular testing.His RCRI is a class II risk, 0.9% risk of major cardiac event. Per Dr. Wendy Poet aspirin and Plavix may be held for 7-10 days prior to surgery. Please resume as soon as hemostasis is achieved."  Preop labs reviewed, unremarkable.  EKG 08/02/2020: Sinus rhythm.  Rate 60.  Possible inferior infarct, age undetermined.  Nuclear stress 08/06/2020: . The left ventricular ejection fraction is normal (55-65%). . Nuclear stress EF: 62%. . There was no ST segment deviation noted during stress. . Defect 1: There is a medium defect of mild severity present in the mid inferior, apical inferior and apical lateral location. . Findings consistent with ischemia. . This is an intermediate risk  study.  TTE 02/19/2020: 1. Left ventricular ejection fraction, by estimation, is 60 to 65%. The  left ventricle has normal function. The left ventricle has no regional  wall motion abnormalities. Left ventricular diastolic parameters were  normal.  2. Right ventricular systolic function is normal. The right ventricular   size is normal. There is normal pulmonary artery systolic pressure.  3. The mitral valve is normal in structure. Trivial mitral valve  regurgitation. No evidence of mitral stenosis.  4. The aortic valve is normal in structure. There is mild calcification  of the aortic valve. Aortic valve regurgitation is not visualized. No  aortic stenosis is present.  5. There is mild dilatation of the aortic root, measuring 37 mm.  6. The inferior vena cava is normal in size with greater than 50%  respiratory variability, suggesting right atrial pressure of 3 mmHg.  )       Anesthesia Quick Evaluation

## 2020-12-03 ENCOUNTER — Encounter: Payer: Self-pay | Admitting: Neurology

## 2020-12-04 ENCOUNTER — Other Ambulatory Visit: Payer: Self-pay | Admitting: Neurosurgery

## 2020-12-04 LAB — SARS CORONAVIRUS 2 (TAT 6-24 HRS): SARS Coronavirus 2: NEGATIVE

## 2020-12-06 ENCOUNTER — Encounter (HOSPITAL_COMMUNITY): Payer: Self-pay | Admitting: Neurosurgery

## 2020-12-06 ENCOUNTER — Other Ambulatory Visit: Payer: Self-pay

## 2020-12-06 ENCOUNTER — Inpatient Hospital Stay (HOSPITAL_COMMUNITY): Payer: No Typology Code available for payment source

## 2020-12-06 ENCOUNTER — Inpatient Hospital Stay (HOSPITAL_COMMUNITY)
Admission: RE | Admit: 2020-12-06 | Discharge: 2020-12-07 | DRG: 455 | Disposition: A | Discharge status: Home / Self Care | Payer: No Typology Code available for payment source | Attending: Neurosurgery | Admitting: Neurosurgery

## 2020-12-06 ENCOUNTER — Inpatient Hospital Stay (HOSPITAL_COMMUNITY): Payer: No Typology Code available for payment source | Admitting: Physician Assistant

## 2020-12-06 ENCOUNTER — Encounter (HOSPITAL_COMMUNITY)
Admission: RE | Disposition: A | Discharge status: Home / Self Care | Payer: Self-pay | Source: Home / Self Care | Attending: Neurosurgery

## 2020-12-06 ENCOUNTER — Inpatient Hospital Stay (HOSPITAL_COMMUNITY): Payer: No Typology Code available for payment source | Admitting: Certified Registered"

## 2020-12-06 DIAGNOSIS — M5117 Intervertebral disc disorders with radiculopathy, lumbosacral region: Secondary | ICD-10-CM | POA: Diagnosis present

## 2020-12-06 DIAGNOSIS — Z955 Presence of coronary angioplasty implant and graft: Secondary | ICD-10-CM

## 2020-12-06 DIAGNOSIS — M48062 Spinal stenosis, lumbar region with neurogenic claudication: Secondary | ICD-10-CM | POA: Diagnosis present

## 2020-12-06 DIAGNOSIS — M5116 Intervertebral disc disorders with radiculopathy, lumbar region: Secondary | ICD-10-CM | POA: Diagnosis present

## 2020-12-06 DIAGNOSIS — I10 Essential (primary) hypertension: Secondary | ICD-10-CM | POA: Diagnosis present

## 2020-12-06 DIAGNOSIS — Z87891 Personal history of nicotine dependence: Secondary | ICD-10-CM

## 2020-12-06 DIAGNOSIS — Z419 Encounter for procedure for purposes other than remedying health state, unspecified: Secondary | ICD-10-CM

## 2020-12-06 DIAGNOSIS — G8929 Other chronic pain: Secondary | ICD-10-CM | POA: Diagnosis present

## 2020-12-06 DIAGNOSIS — M4807 Spinal stenosis, lumbosacral region: Secondary | ICD-10-CM | POA: Diagnosis present

## 2020-12-06 DIAGNOSIS — I252 Old myocardial infarction: Secondary | ICD-10-CM

## 2020-12-06 DIAGNOSIS — E785 Hyperlipidemia, unspecified: Secondary | ICD-10-CM | POA: Diagnosis present

## 2020-12-06 DIAGNOSIS — I2510 Atherosclerotic heart disease of native coronary artery without angina pectoris: Secondary | ICD-10-CM | POA: Diagnosis present

## 2020-12-06 DIAGNOSIS — M109 Gout, unspecified: Secondary | ICD-10-CM | POA: Diagnosis present

## 2020-12-06 DIAGNOSIS — M48061 Spinal stenosis, lumbar region without neurogenic claudication: Secondary | ICD-10-CM

## 2020-12-06 HISTORY — DX: Spinal stenosis, lumbar region without neurogenic claudication: M48.061

## 2020-12-06 LAB — GLUCOSE, CAPILLARY
Glucose-Capillary: 130 mg/dL — ABNORMAL HIGH (ref 70–99)
Glucose-Capillary: 125 mg/dL — ABNORMAL HIGH (ref 70–99)

## 2020-12-06 LAB — ABO/RH: ABO/RH(D): O POS

## 2020-12-06 IMAGING — RF DG LUMBAR SPINE 2-3V
1 series · 3 of 3 positions shown · non-contrast
Comparison: [DATE].

CLINICAL DATA: Posterior fusion of L4-5 and L5-S1.

EXAM:
LUMBAR SPINE - 2-3 VIEW; DG C-ARM 1-60 MIN-NO REPORT
Radiation exposure index: 34.32 mGy.

[Series 1: run · 3 of 3 slices shown]
[im 1/3]
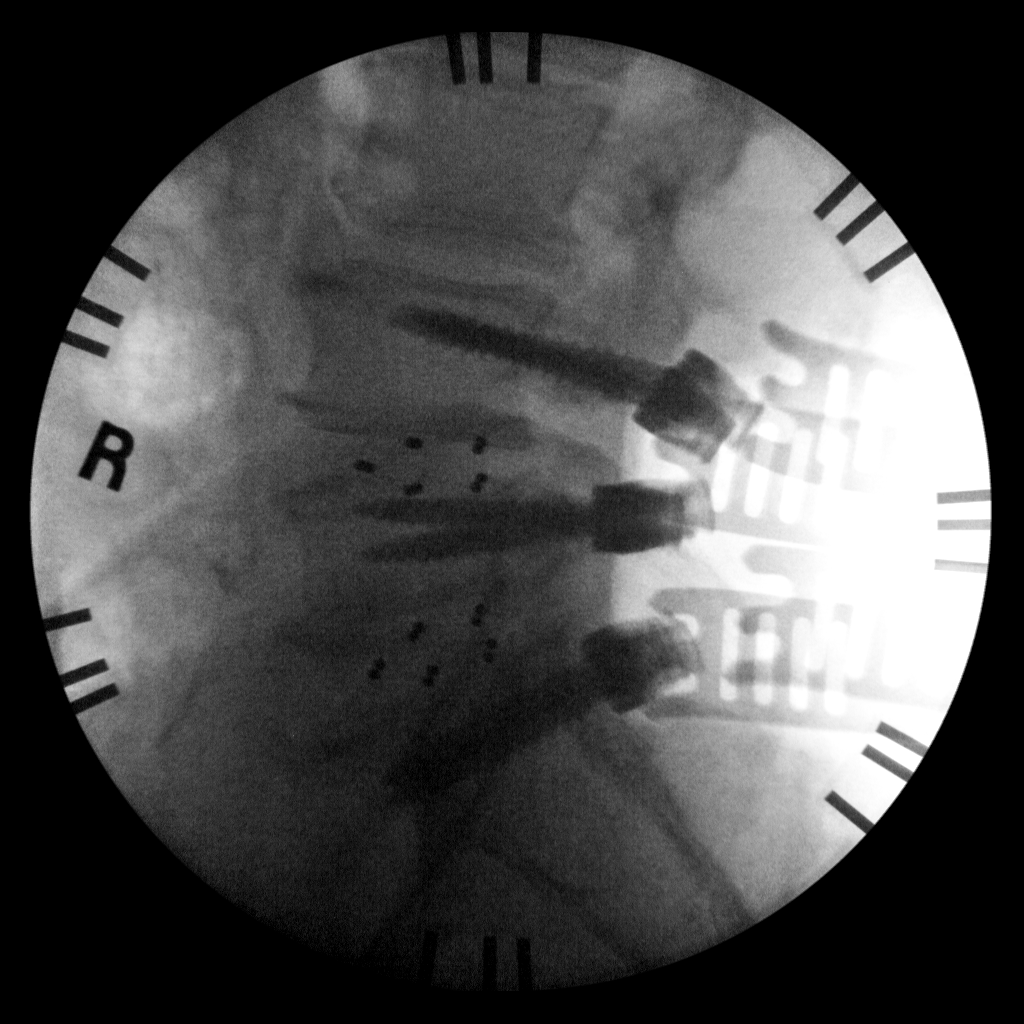
[im 2/3]
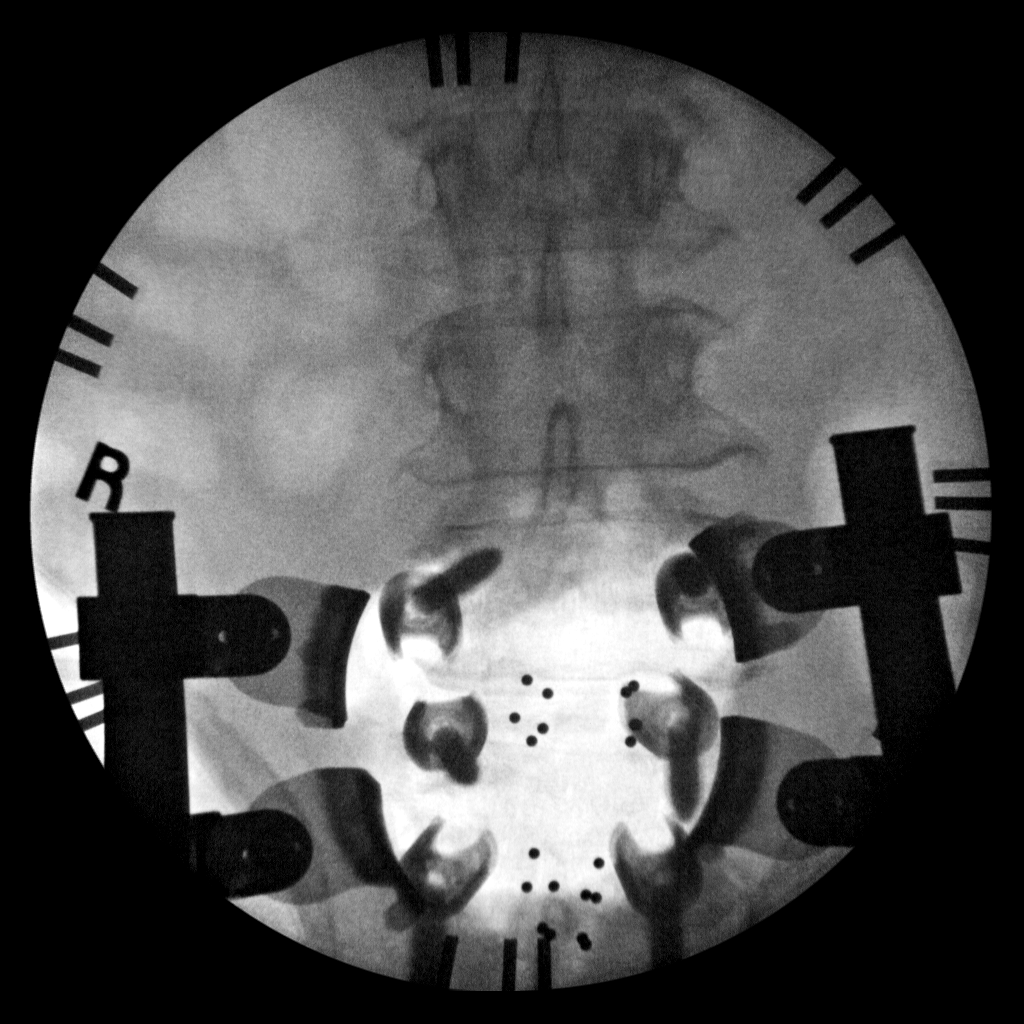
[im 3/3]
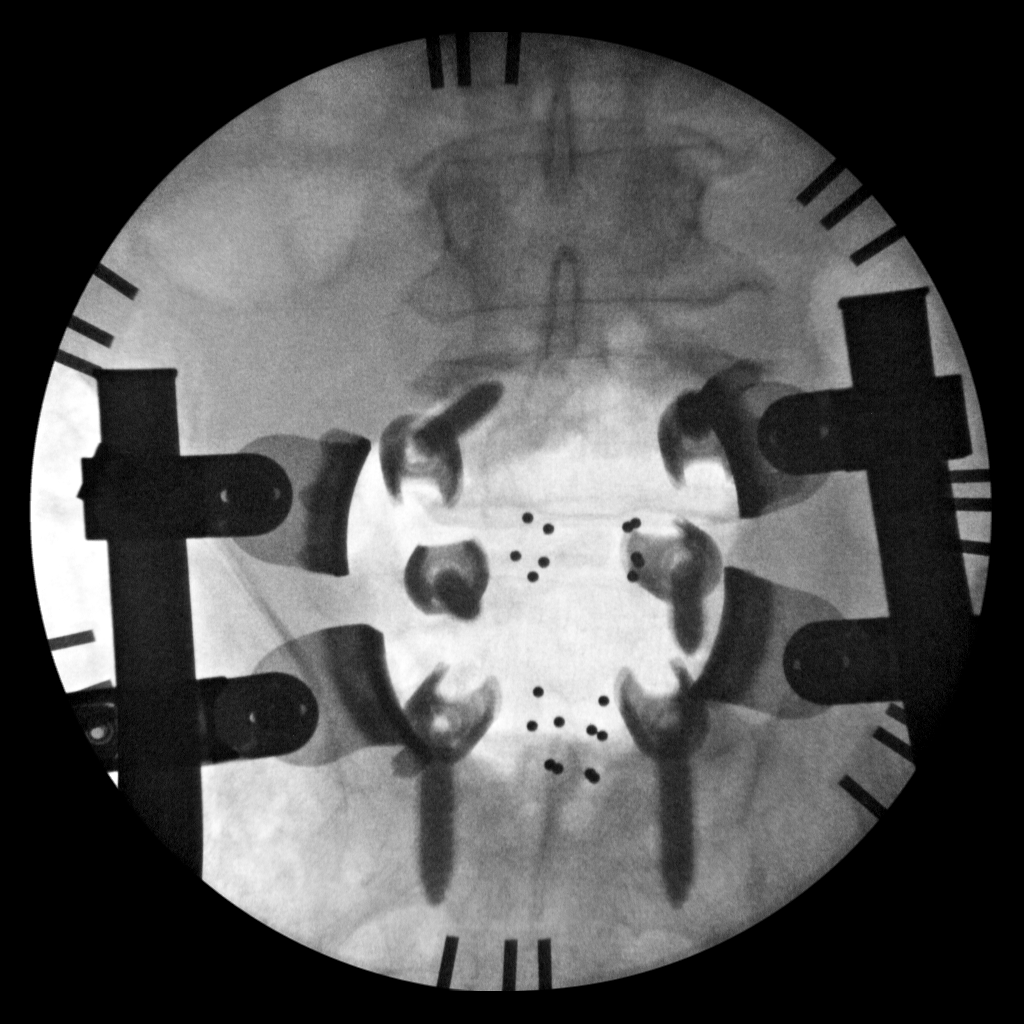

[3 of 3 positions shown; findings below may reference images not displayed]

FINDINGS: Three intraoperative fluoroscopic images were obtained of the lower
lumbar spine. Status post bilateral intrapedicular screw placement
at L4, L5 and S1 with interbody fusion of L4-5 and L5-S1.
IMPRESSION: Fluoroscopic guidance provided during surgical posterior fusion of
L4-5 and L5-S1.

## 2020-12-06 SURGERY — POSTERIOR LUMBAR FUSION 2 LEVEL
Anesthesia: General | Site: Back

## 2020-12-06 MED ORDER — PROPOFOL 10 MG/ML IV BOLUS
INTRAVENOUS | Status: AC
  Filled 2020-12-06: qty 20

## 2020-12-06 MED ORDER — VANCOMYCIN HCL IN DEXTROSE 1-5 GM/200ML-% IV SOLN
INTRAVENOUS | Status: AC
  Administered 2020-12-06: 1000 mg via INTRAVENOUS
  Filled 2020-12-06: qty 200

## 2020-12-06 MED ORDER — VITAMIN D (ERGOCALCIFEROL) 1.25 MG (50000 UNIT) PO CAPS: 50000.0000 [IU] | ORAL_CAPSULE | ORAL | Status: DC

## 2020-12-06 MED ORDER — FLEET ENEMA 7-19 GM/118ML RE ENEM: 1.0000 | ENEMA | Freq: Once | RECTAL | Status: DC | PRN

## 2020-12-06 MED ORDER — MIDAZOLAM HCL 2 MG/2ML IJ SOLN
INTRAMUSCULAR | Status: AC
  Filled 2020-12-06: qty 2

## 2020-12-06 MED ORDER — FENTANYL CITRATE (PF) 250 MCG/5ML IJ SOLN
INTRAMUSCULAR | Status: DC | PRN
  Administered 2020-12-06 (×2): 50 ug via INTRAVENOUS
  Administered 2020-12-06: 150 ug via INTRAVENOUS

## 2020-12-06 MED ORDER — HYDROMORPHONE HCL 1 MG/ML IJ SOLN: 0.5000 mg | INTRAMUSCULAR | Status: DC | PRN

## 2020-12-06 MED ORDER — DOCUSATE SODIUM 100 MG PO CAPS
100.0000 mg | ORAL_CAPSULE | Freq: Two times a day (BID) | ORAL | Status: DC
  Administered 2020-12-06: 100 mg via ORAL
  Filled 2020-12-06 (×2): qty 1

## 2020-12-06 MED ORDER — DEXAMETHASONE SODIUM PHOSPHATE 10 MG/ML IJ SOLN
INTRAMUSCULAR | Status: DC | PRN
  Administered 2020-12-06: 10 mg via INTRAVENOUS

## 2020-12-06 MED ORDER — ROCURONIUM BROMIDE 10 MG/ML (PF) SYRINGE
PREFILLED_SYRINGE | INTRAVENOUS | Status: AC
  Filled 2020-12-06: qty 10

## 2020-12-06 MED ORDER — KETOROLAC TROMETHAMINE 30 MG/ML IJ SOLN
INTRAMUSCULAR | Status: AC
  Filled 2020-12-06: qty 1

## 2020-12-06 MED ORDER — GABAPENTIN 300 MG PO CAPS
300.0000 mg | ORAL_CAPSULE | Freq: Three times a day (TID) | ORAL | Status: DC
  Administered 2020-12-06: 300 mg via ORAL
  Filled 2020-12-06 (×2): qty 1

## 2020-12-06 MED ORDER — ACETAMINOPHEN 500 MG PO TABS
1000.0000 mg | ORAL_TABLET | Freq: Four times a day (QID) | ORAL | Status: DC
  Administered 2020-12-06 – 2020-12-07 (×2): 1000 mg via ORAL
  Filled 2020-12-06 (×2): qty 2

## 2020-12-06 MED ORDER — SUGAMMADEX SODIUM 200 MG/2ML IV SOLN
INTRAVENOUS | Status: DC | PRN
  Administered 2020-12-06: 300 mg via INTRAVENOUS

## 2020-12-06 MED ORDER — CALCIUM CHLORIDE 10 % IV SOLN
INTRAVENOUS | Status: DC | PRN
  Administered 2020-12-06: 1 g via INTRAVENOUS

## 2020-12-06 MED ORDER — GLYCOPYRROLATE 0.2 MG/ML IJ SOLN
INTRAMUSCULAR | Status: DC | PRN
  Administered 2020-12-06: .2 mg via INTRAVENOUS

## 2020-12-06 MED ORDER — LIDOCAINE-EPINEPHRINE 1 %-1:100000 IJ SOLN
INTRAMUSCULAR | Status: AC
  Filled 2020-12-06: qty 1

## 2020-12-06 MED ORDER — PRIMIDONE 50 MG PO TABS
50.0000 mg | ORAL_TABLET | Freq: Every day | ORAL | Status: DC
  Administered 2020-12-06: 50 mg via ORAL
  Filled 2020-12-06 (×2): qty 1

## 2020-12-06 MED ORDER — BISACODYL 10 MG RE SUPP: 10.0000 mg | Freq: Every day | RECTAL | Status: DC | PRN

## 2020-12-06 MED ORDER — ICOSAPENT ETHYL 1 G PO CAPS
2.0000 g | ORAL_CAPSULE | Freq: Two times a day (BID) | ORAL | Status: DC
  Administered 2020-12-06: 2 g via ORAL
  Filled 2020-12-06 (×3): qty 2

## 2020-12-06 MED ORDER — MENTHOL 3 MG MT LOZG: 1.0000 | LOZENGE | OROMUCOSAL | Status: DC | PRN

## 2020-12-06 MED ORDER — SODIUM CHLORIDE 0.9 % IV SOLN: 250.0000 mL | INTRAVENOUS | Status: DC

## 2020-12-06 MED ORDER — BUPIVACAINE HCL (PF) 0.5 % IJ SOLN
INTRAMUSCULAR | Status: DC | PRN
  Administered 2020-12-06: 5 mL

## 2020-12-06 MED ORDER — FENTANYL CITRATE (PF) 250 MCG/5ML IJ SOLN
INTRAMUSCULAR | Status: AC
  Filled 2020-12-06: qty 5

## 2020-12-06 MED ORDER — ACETAMINOPHEN 650 MG RE SUPP: 650.0000 mg | RECTAL | Status: DC | PRN

## 2020-12-06 MED ORDER — 0.9 % SODIUM CHLORIDE (POUR BTL) OPTIME
TOPICAL | Status: DC | PRN
  Administered 2020-12-06: 1000 mL

## 2020-12-06 MED ORDER — CHLORHEXIDINE GLUCONATE CLOTH 2 % EX PADS: 6.0000 | MEDICATED_PAD | Freq: Once | CUTANEOUS | Status: DC

## 2020-12-06 MED ORDER — ASPIRIN EC 81 MG PO TBEC
81.0000 mg | DELAYED_RELEASE_TABLET | Freq: Every day | ORAL | Status: DC
  Filled 2020-12-06: qty 1

## 2020-12-06 MED ORDER — NITROGLYCERIN 0.4 MG SL SUBL: 0.4000 mg | SUBLINGUAL_TABLET | SUBLINGUAL | Status: DC | PRN

## 2020-12-06 MED ORDER — ALBUMIN HUMAN 5 % IV SOLN
12.5000 g | Freq: Once | INTRAVENOUS | Status: AC
  Administered 2020-12-06: 12.5 g via INTRAVENOUS

## 2020-12-06 MED ORDER — PHENOL 1.4 % MT LIQD: 1.0000 | OROMUCOSAL | Status: DC | PRN

## 2020-12-06 MED ORDER — ALBUMIN HUMAN 5 % IV SOLN: INTRAVENOUS | Status: DC | PRN

## 2020-12-06 MED ORDER — HYDROMORPHONE HCL 1 MG/ML IJ SOLN
0.2500 mg | INTRAMUSCULAR | Status: DC | PRN
  Administered 2020-12-06 (×3): 0.5 mg via INTRAVENOUS

## 2020-12-06 MED ORDER — KCL IN DEXTROSE-NACL 20-5-0.45 MEQ/L-%-% IV SOLN: INTRAVENOUS | Status: DC

## 2020-12-06 MED ORDER — PROMETHAZINE HCL 25 MG/ML IJ SOLN: 6.2500 mg | INTRAMUSCULAR | Status: DC | PRN

## 2020-12-06 MED ORDER — KETOROLAC TROMETHAMINE 15 MG/ML IJ SOLN
7.5000 mg | Freq: Four times a day (QID) | INTRAMUSCULAR | Status: DC
  Administered 2020-12-06 – 2020-12-07 (×2): 7.5 mg via INTRAVENOUS
  Filled 2020-12-06 (×2): qty 1

## 2020-12-06 MED ORDER — VANCOMYCIN HCL IN DEXTROSE 1-5 GM/200ML-% IV SOLN
1000.0000 mg | Freq: Once | INTRAVENOUS | Status: AC
  Administered 2020-12-06: 1000 mg via INTRAVENOUS
  Filled 2020-12-06: qty 200

## 2020-12-06 MED ORDER — LIDOCAINE 2% (20 MG/ML) 5 ML SYRINGE
INTRAMUSCULAR | Status: DC | PRN
Start: 2020-12-06 — End: 2020-12-06
  Administered 2020-12-06: 100 mg via INTRAVENOUS

## 2020-12-06 MED ORDER — ISOSORBIDE MONONITRATE ER 60 MG PO TB24
120.0000 mg | ORAL_TABLET | Freq: Every day | ORAL | Status: DC
  Filled 2020-12-06: qty 2

## 2020-12-06 MED ORDER — HYDROMORPHONE HCL 1 MG/ML IJ SOLN
INTRAMUSCULAR | Status: AC
  Filled 2020-12-06: qty 1

## 2020-12-06 MED ORDER — ORAL CARE MOUTH RINSE: 15.0000 mL | Freq: Once | OROMUCOSAL | Status: AC

## 2020-12-06 MED ORDER — POLYETHYLENE GLYCOL 3350 17 G PO PACK: 17.0000 g | PACK | Freq: Every day | ORAL | Status: DC | PRN

## 2020-12-06 MED ORDER — ONDANSETRON HCL 4 MG/2ML IJ SOLN: 4.0000 mg | Freq: Four times a day (QID) | INTRAMUSCULAR | Status: DC | PRN

## 2020-12-06 MED ORDER — METHOCARBAMOL 500 MG PO TABS
ORAL_TABLET | ORAL | Status: AC
  Filled 2020-12-06: qty 1

## 2020-12-06 MED ORDER — ONDANSETRON HCL 4 MG/2ML IJ SOLN
INTRAMUSCULAR | Status: AC
  Filled 2020-12-06: qty 2

## 2020-12-06 MED ORDER — PHENYLEPHRINE HCL-NACL 20-0.9 MG/250ML-% IV SOLN
INTRAVENOUS | Status: DC | PRN
  Administered 2020-12-06: 50 ug/min via INTRAVENOUS

## 2020-12-06 MED ORDER — THROMBIN 5000 UNITS EX SOLR
CUTANEOUS | Status: AC
  Filled 2020-12-06: qty 5000

## 2020-12-06 MED ORDER — THROMBIN 5000 UNITS EX SOLR
OROMUCOSAL | Status: DC | PRN
  Administered 2020-12-06: 5 mL via TOPICAL

## 2020-12-06 MED ORDER — VANCOMYCIN HCL IN DEXTROSE 1-5 GM/200ML-% IV SOLN: 1000.0000 mg | INTRAVENOUS | Status: AC

## 2020-12-06 MED ORDER — LIDOCAINE-EPINEPHRINE 1 %-1:100000 IJ SOLN
INTRAMUSCULAR | Status: DC | PRN
  Administered 2020-12-06: 5 mL

## 2020-12-06 MED ORDER — MIDAZOLAM HCL 2 MG/2ML IJ SOLN
INTRAMUSCULAR | Status: DC | PRN
  Administered 2020-12-06: 2 mg via INTRAVENOUS

## 2020-12-06 MED ORDER — METOPROLOL TARTRATE 25 MG PO TABS
75.0000 mg | ORAL_TABLET | Freq: Two times a day (BID) | ORAL | Status: DC
  Administered 2020-12-06: 75 mg via ORAL
  Filled 2020-12-06 (×2): qty 3

## 2020-12-06 MED ORDER — ALUM & MAG HYDROXIDE-SIMETH 200-200-20 MG/5ML PO SUSP: 30.0000 mL | Freq: Four times a day (QID) | ORAL | Status: DC | PRN

## 2020-12-06 MED ORDER — ALLOPURINOL 100 MG PO TABS
100.0000 mg | ORAL_TABLET | Freq: Every day | ORAL | Status: DC
  Filled 2020-12-06: qty 1

## 2020-12-06 MED ORDER — ALBUMIN HUMAN 5 % IV SOLN
INTRAVENOUS | Status: AC
  Filled 2020-12-06: qty 250

## 2020-12-06 MED ORDER — KETOROLAC TROMETHAMINE 15 MG/ML IJ SOLN
INTRAMUSCULAR | Status: AC
  Filled 2020-12-06: qty 1

## 2020-12-06 MED ORDER — PHENYLEPHRINE 40 MCG/ML (10ML) SYRINGE FOR IV PUSH (FOR BLOOD PRESSURE SUPPORT)
PREFILLED_SYRINGE | INTRAVENOUS | Status: DC | PRN
  Administered 2020-12-06: 80 ug via INTRAVENOUS
  Administered 2020-12-06 (×2): 200 ug via INTRAVENOUS

## 2020-12-06 MED ORDER — ONDANSETRON HCL 4 MG PO TABS: 4.0000 mg | ORAL_TABLET | Freq: Four times a day (QID) | ORAL | Status: DC | PRN

## 2020-12-06 MED ORDER — EPHEDRINE SULFATE-NACL 50-0.9 MG/10ML-% IV SOSY
PREFILLED_SYRINGE | INTRAVENOUS | Status: DC | PRN
  Administered 2020-12-06 (×2): 15 mg via INTRAVENOUS
  Administered 2020-12-06 (×2): 10 mg via INTRAVENOUS
  Administered 2020-12-06: 15 mg via INTRAVENOUS

## 2020-12-06 MED ORDER — OXYCODONE HCL 5 MG PO TABS: 5.0000 mg | ORAL_TABLET | ORAL | Status: DC | PRN

## 2020-12-06 MED ORDER — METHOCARBAMOL 1000 MG/10ML IJ SOLN
500.0000 mg | Freq: Four times a day (QID) | INTRAVENOUS | Status: DC | PRN
  Filled 2020-12-06: qty 5

## 2020-12-06 MED ORDER — LIDOCAINE 2% (20 MG/ML) 5 ML SYRINGE
INTRAMUSCULAR | Status: AC
  Filled 2020-12-06: qty 5

## 2020-12-06 MED ORDER — BUPIVACAINE LIPOSOME 1.3 % IJ SUSP
INTRAMUSCULAR | Status: AC
  Filled 2020-12-06: qty 20

## 2020-12-06 MED ORDER — KETOROLAC TROMETHAMINE 15 MG/ML IJ SOLN
15.0000 mg | Freq: Once | INTRAMUSCULAR | Status: AC
  Administered 2020-12-06: 15 mg via INTRAVENOUS

## 2020-12-06 MED ORDER — DIPHENHYDRAMINE HCL 50 MG/ML IJ SOLN
25.0000 mg | Freq: Once | INTRAMUSCULAR | Status: AC
  Administered 2020-12-06: 25 mg via INTRAVENOUS

## 2020-12-06 MED ORDER — METHOCARBAMOL 500 MG PO TABS
500.0000 mg | ORAL_TABLET | Freq: Four times a day (QID) | ORAL | Status: DC | PRN
  Administered 2020-12-06: 500 mg via ORAL
  Filled 2020-12-06: qty 1

## 2020-12-06 MED ORDER — ROSUVASTATIN CALCIUM 20 MG PO TABS
40.0000 mg | ORAL_TABLET | Freq: Every day | ORAL | Status: DC
  Filled 2020-12-06: qty 2

## 2020-12-06 MED ORDER — AMLODIPINE BESYLATE 5 MG PO TABS
10.0000 mg | ORAL_TABLET | Freq: Every day | ORAL | Status: DC
  Filled 2020-12-06: qty 2

## 2020-12-06 MED ORDER — ONDANSETRON HCL 4 MG/2ML IJ SOLN
INTRAMUSCULAR | Status: DC | PRN
  Administered 2020-12-06: 4 mg via INTRAVENOUS

## 2020-12-06 MED ORDER — ACETAMINOPHEN 325 MG PO TABS: 650.0000 mg | ORAL_TABLET | ORAL | Status: DC | PRN

## 2020-12-06 MED ORDER — DEXAMETHASONE SODIUM PHOSPHATE 10 MG/ML IJ SOLN
INTRAMUSCULAR | Status: AC
  Filled 2020-12-06: qty 1

## 2020-12-06 MED ORDER — DIPHENHYDRAMINE HCL 50 MG/ML IJ SOLN
INTRAMUSCULAR | Status: AC
  Filled 2020-12-06: qty 1

## 2020-12-06 MED ORDER — BUPIVACAINE HCL (PF) 0.5 % IJ SOLN
INTRAMUSCULAR | Status: AC
  Filled 2020-12-06: qty 30

## 2020-12-06 MED ORDER — LACTATED RINGERS IV SOLN: INTRAVENOUS | Status: DC

## 2020-12-06 MED ORDER — CHLORHEXIDINE GLUCONATE 0.12 % MT SOLN: 15.0000 mL | Freq: Once | OROMUCOSAL | Status: AC

## 2020-12-06 MED ORDER — HYDROMORPHONE HCL 2 MG PO TABS
2.0000 mg | ORAL_TABLET | ORAL | Status: DC | PRN
  Administered 2020-12-07: 2 mg via ORAL
  Filled 2020-12-06: qty 1

## 2020-12-06 MED ORDER — PANTOPRAZOLE SODIUM 40 MG IV SOLR
40.0000 mg | Freq: Every day | INTRAVENOUS | Status: DC
  Administered 2020-12-06: 40 mg via INTRAVENOUS
  Filled 2020-12-06: qty 40

## 2020-12-06 MED ORDER — MEPERIDINE HCL 25 MG/ML IJ SOLN: 6.2500 mg | INTRAMUSCULAR | Status: DC | PRN

## 2020-12-06 MED ORDER — ROCURONIUM BROMIDE 10 MG/ML (PF) SYRINGE
PREFILLED_SYRINGE | INTRAVENOUS | Status: DC | PRN
  Administered 2020-12-06 (×2): 100 mg via INTRAVENOUS

## 2020-12-06 MED ORDER — PROPOFOL 10 MG/ML IV BOLUS
INTRAVENOUS | Status: DC | PRN
  Administered 2020-12-06: 200 mg via INTRAVENOUS

## 2020-12-06 MED ORDER — RANOLAZINE ER 500 MG PO TB12
1000.0000 mg | ORAL_TABLET | Freq: Two times a day (BID) | ORAL | Status: DC
  Administered 2020-12-06: 1000 mg via ORAL
  Filled 2020-12-06 (×3): qty 2

## 2020-12-06 MED ORDER — SODIUM CHLORIDE 0.9% FLUSH
3.0000 mL | Freq: Two times a day (BID) | INTRAVENOUS | Status: DC
  Administered 2020-12-06: 3 mL via INTRAVENOUS

## 2020-12-06 MED ORDER — LISINOPRIL 2.5 MG PO TABS
2.5000 mg | ORAL_TABLET | Freq: Every day | ORAL | Status: DC
  Filled 2020-12-06: qty 1

## 2020-12-06 MED ORDER — CHLORHEXIDINE GLUCONATE 0.12 % MT SOLN
OROMUCOSAL | Status: AC
  Administered 2020-12-06: 15 mL via OROMUCOSAL
  Filled 2020-12-06: qty 15

## 2020-12-06 MED ORDER — SODIUM CHLORIDE 0.9% FLUSH: 3.0000 mL | INTRAVENOUS | Status: DC | PRN

## 2020-12-06 SURGICAL SUPPLY — 89 items
BAG COUNTER SPONGE SURGICOUNT (BAG) ×6 IMPLANT
BAND RUBBER #7 7IN GRN STRL LF (MISCELLANEOUS) ×3 IMPLANT
BASKET BONE COLLECTION (BASKET) ×3 IMPLANT
BLADE BN FN 3.2XSTRL LF (MISCELLANEOUS) ×2 IMPLANT
BLADE BONE MILL FINE (MISCELLANEOUS) ×1
BLADE CLIPPER SURG (BLADE) IMPLANT
BUR MATCHSTICK NEURO 3.0 LAGG (BURR) ×3 IMPLANT
BUR PRECISION FLUTE 5.0 (BURR) ×3 IMPLANT
CAGE PLIF 8X9X23-12 LUMBAR (Cage) ×12 IMPLANT
CANISTER SUCT 3000ML PPV (MISCELLANEOUS) ×3 IMPLANT
CARTRIDGE OIL MAESTRO DRILL (MISCELLANEOUS) ×2 IMPLANT
CLIP PULSE STIMULATION (NEUROSURGERY SUPPLIES) IMPLANT
CNTNR URN SCR LID CUP LEK RST (MISCELLANEOUS) ×2 IMPLANT
CONT SPEC 4OZ STRL OR WHT (MISCELLANEOUS) ×1
COVER BACK TABLE 60X90IN (DRAPES) ×3 IMPLANT
COVER TRANSDUCER ULTRASND 9X24 (MISCELLANEOUS) ×3 IMPLANT
DECANTER SPIKE VIAL GLASS SM (MISCELLANEOUS) ×3 IMPLANT
DERMABOND ADVANCED (GAUZE/BANDAGES/DRESSINGS) ×1
DERMABOND ADVANCED .7 DNX12 (GAUZE/BANDAGES/DRESSINGS) ×2 IMPLANT
DIFFUSER DRILL AIR PNEUMATIC (MISCELLANEOUS) ×3 IMPLANT
DRAPE C-ARM 42X72 X-RAY (DRAPES) ×3 IMPLANT
DRAPE C-ARMOR (DRAPES) ×3 IMPLANT
DRAPE LAPAROTOMY 100X72X124 (DRAPES) ×3 IMPLANT
DRAPE PULSE TABLE ARRAY (DRAPES) IMPLANT
DRAPE SURG 17X23 STRL (DRAPES) ×3 IMPLANT
DRSG OPSITE POSTOP 4X6 (GAUZE/BANDAGES/DRESSINGS) ×3 IMPLANT
DURAPREP 26ML APPLICATOR (WOUND CARE) ×3 IMPLANT
ELECT BLADE 4.0 EZ CLEAN MEGAD (MISCELLANEOUS) ×3
ELECT REM PT RETURN 9FT ADLT (ELECTROSURGICAL) ×3
ELECTRODE BLDE 4.0 EZ CLN MEGD (MISCELLANEOUS) ×2 IMPLANT
ELECTRODE REM PT RTRN 9FT ADLT (ELECTROSURGICAL) ×2 IMPLANT
EVACUATOR 1/8 PVC DRAIN (DRAIN) IMPLANT
GAUZE 4X4 16PLY ~~LOC~~+RFID DBL (SPONGE) ×3 IMPLANT
GAUZE SPONGE 4X4 12PLY STRL (GAUZE/BANDAGES/DRESSINGS) ×3 IMPLANT
GLOVE EXAM NITRILE XL STR (GLOVE) IMPLANT
GLOVE SRG 8 PF TXTR STRL LF DI (GLOVE) ×4 IMPLANT
GLOVE SURG ENC MOIS LTX SZ8 (GLOVE) ×6 IMPLANT
GLOVE SURG LTX SZ6.5 (GLOVE) ×3 IMPLANT
GLOVE SURG LTX SZ8 (GLOVE) ×9 IMPLANT
GLOVE SURG UNDER POLY LF SZ6.5 (GLOVE) ×12 IMPLANT
GLOVE SURG UNDER POLY LF SZ7.5 (GLOVE) ×12 IMPLANT
GLOVE SURG UNDER POLY LF SZ8 (GLOVE) ×2
GLOVE SURG UNDER POLY LF SZ8.5 (GLOVE) ×6 IMPLANT
GOWN STRL REUS W/ TWL LRG LVL3 (GOWN DISPOSABLE) ×4 IMPLANT
GOWN STRL REUS W/ TWL XL LVL3 (GOWN DISPOSABLE) ×6 IMPLANT
GOWN STRL REUS W/TWL 2XL LVL3 (GOWN DISPOSABLE) ×12 IMPLANT
GOWN STRL REUS W/TWL LRG LVL3 (GOWN DISPOSABLE) ×2
GOWN STRL REUS W/TWL XL LVL3 (GOWN DISPOSABLE) ×3
HEMOSTAT POWDER KIT SURGIFOAM (HEMOSTASIS) ×3 IMPLANT
KIT BASIN OR (CUSTOM PROCEDURE TRAY) ×3 IMPLANT
KIT INFUSE X SMALL 1.4CC (Orthopedic Implant) ×3 IMPLANT
KIT POSITION SURG JACKSON T1 (MISCELLANEOUS) ×3 IMPLANT
KIT PULSE EMG NEEDLE (NEUROSURGERY SUPPLIES) IMPLANT
KIT PULSE EMG SURFACE (NEUROSURGERY SUPPLIES) IMPLANT
KIT PULSE MIOM NEEDLE (NEUROSURGERY SUPPLIES) IMPLANT
KIT PULSE MIOM SURFACE (NEUROSURGERY SUPPLIES) IMPLANT
KIT PULSE XLIF (NEUROSURGERY SUPPLIES) IMPLANT
KIT TURNOVER KIT B (KITS) ×3 IMPLANT
MILL MEDIUM DISP (BLADE) IMPLANT
NEEDLE HYPO 21X1.5 SAFETY (NEEDLE) ×3 IMPLANT
NEEDLE HYPO 25X1 1.5 SAFETY (NEEDLE) ×3 IMPLANT
NEEDLE SPNL 18GX3.5 QUINCKE PK (NEEDLE) ×3 IMPLANT
NS IRRIG 1000ML POUR BTL (IV SOLUTION) ×3 IMPLANT
OIL CARTRIDGE MAESTRO DRILL (MISCELLANEOUS) ×3
PACK LAMINECTOMY NEURO (CUSTOM PROCEDURE TRAY) ×3 IMPLANT
PAD ARMBOARD 7.5X6 YLW CONV (MISCELLANEOUS) ×6 IMPLANT
PATTIES SURGICAL .5 X.5 (GAUZE/BANDAGES/DRESSINGS) IMPLANT
PATTIES SURGICAL .5 X1 (DISPOSABLE) IMPLANT
PATTIES SURGICAL 1X1 (DISPOSABLE) IMPLANT
PROBE PULSE STIMULATION (NEUROSURGERY SUPPLIES) IMPLANT
ROD RELIN-O LORD 5.5X65MM (Rod) ×6 IMPLANT
RUBBER BAND PULSE STRL (MISCELLANEOUS) IMPLANT
SCREW LOCK RELINE 5.5 TULIP (Screw) ×18 IMPLANT
SCREW RELINE-O POLY 6.5X40 (Screw) ×6 IMPLANT
SCREW RELINE-O POLY 6.5X45 (Screw) ×6 IMPLANT
SCREW RELINE-O POLY 7.5X40 (Screw) ×6 IMPLANT
SPONGE SURGIFOAM ABS GEL 100 (HEMOSTASIS) IMPLANT
SPONGE T-LAP 4X18 ~~LOC~~+RFID (SPONGE) ×6 IMPLANT
STAPLER SKIN PROX WIDE 3.9 (STAPLE) ×3 IMPLANT
SUT VIC AB 1 CT1 18XBRD ANBCTR (SUTURE) ×4 IMPLANT
SUT VIC AB 1 CT1 8-18 (SUTURE) ×2
SUT VIC AB 2-0 CT1 18 (SUTURE) ×6 IMPLANT
SUT VIC AB 3-0 SH 8-18 (SUTURE) ×6 IMPLANT
SYR 20ML LL LF (SYRINGE) ×3 IMPLANT
SYR 5ML LL (SYRINGE) IMPLANT
TOWEL GREEN STERILE (TOWEL DISPOSABLE) ×3 IMPLANT
TOWEL GREEN STERILE FF (TOWEL DISPOSABLE) ×3 IMPLANT
TRAY FOLEY MTR SLVR 16FR STAT (SET/KITS/TRAYS/PACK) ×3 IMPLANT
WATER STERILE IRR 1000ML POUR (IV SOLUTION) ×3 IMPLANT

## 2020-12-06 NOTE — Progress Notes (Signed)
Pharmacy Antibiotic Note  Robert Ramos is a 70 y.o. male admitted on 12/06/2020 for spinal procedure.  Pharmacy has been consulted for vancomycin dosing for surgical prophylaxis.  Patient does not have a drain.  SCr 1.08, CrCL 64 ml/min, pre-op vanc dose given at 1020.  Plan: Vanc 1gm IV x 1 tonight Pharmacy will sign off  Height: '5\' 5"'$  (165.1 cm) Weight: 83.9 kg (185 lb) IBW/kg (Calculated) : 61.5  Temp (24hrs), Avg:97.7 F (36.5 C), Min:97.6 F (36.4 C), Max:97.7 F (36.5 C)  No results for input(s): WBC, CREATININE, LATICACIDVEN, VANCOTROUGH, VANCOPEAK, VANCORANDOM, GENTTROUGH, GENTPEAK, GENTRANDOM, TOBRATROUGH, TOBRAPEAK, TOBRARND, AMIKACINPEAK, AMIKACINTROU, AMIKACIN in the last 168 hours.  Estimated Creatinine Clearance: 64.4 mL/min (by C-G formula based on SCr of 1.08 mg/dL).    Allergies  Allergen Reactions   Penicillin G Anaphylaxis, Swelling and Rash    Has patient had a PCN reaction causing immediate rash, facial/tongue/throat swelling, SOB or lightheadedness with hypotension:Yes Has patient had a PCN reaction causing severe rash involving mucus membranes or skin necrosis:Yes--all over body Has patient had a PCN reaction that required hospitalization:No--treated in ER Has patient had a PCN reaction occurring within the last 10 years:No If all of the above answers are "NO", then may proceed with Cephalosporin use.    Penicillins Anaphylaxis   Hydrocodone Nausea Only    Sick on stomach all day long    Oxycodone Nausea Only    Sick on stomach all day long    Zolpidem Other (See Comments)    dizziness    Robert Ramos, PharmD, BCPS, [place] 12/06/2020, 6:23 PM

## 2020-12-06 NOTE — Transfer of Care (Signed)
Immediate Anesthesia Transfer of Care Note  Patient: Robert Ramos  Procedure(s) Performed: Lumbar four-fiveLumbar five Sacral one Posterior lateral and interbody fusion (Back) APPLICATION OF PULSE  Patient Location: PACU  Anesthesia Type:General  Level of Consciousness: awake, alert  and oriented  Airway & Oxygen Therapy: Patient Spontanous Breathing and Patient connected to nasal cannula oxygen  Post-op Assessment: Report given to RN and Post -op Vital signs reviewed and stable  Post vital signs: Reviewed and stable  Last Vitals:  Vitals Value Taken Time  BP 104/66 12/06/20 1529  Temp    Pulse 73 12/06/20 1532  Resp 13 12/06/20 1532  SpO2 100 % 12/06/20 1532  Vitals shown include unvalidated device data.  Last Pain:  Vitals:   12/06/20 1015  TempSrc:   PainSc: 0-No pain      Patients Stated Pain Goal: 5 ([id number])  Complications: No notable events documented.

## 2020-12-06 NOTE — Op Note (Signed)
12/06/2020  3:41 PM  PATIENT:  Robert Ramos  70 y.o. male  PRE-OPERATIVE DIAGNOSIS:  Spinal stenosis, Lumbar region with neurogenic claudication, lumbar foraminal stenosis, disc degeneration, recurrent spinal stenosis, lumbago, radiculopathy L 45, L 5 S1 levels  POST-OPERATIVE DIAGNOSIS:  Spinal stenosis, Lumbar region with neurogenic claudication, lumbar foraminal stenosis, disc degeneration, recurrent spinal stenosis, lumbago, radiculopathy L 45, L 5 S1 levels  PROCEDURE:  Procedure(s): Lumbar four-fiveLumbar five Sacral one Posterior lateral and interbody fusion (N/A) APPLICATION OF PULSE with PEEK cages, autograft, pedicle screw fixation, posterolateral arthrodesis with autograft  SURGEON:  Surgeon(s) and Role:    Erline Levine, MD - Primary    * Ashok Pall, MD - Assisting  PHYSICIAN ASSISTANT: Glenford Peers, NP  ASSISTANTS: Poteat, RN   ANESTHESIA:   general  EBL:  300 mL   BLOOD ADMINISTERED:none  DRAINS: none   LOCAL MEDICATIONS USED:  MARCAINE    and LIDOCAINE   SPECIMEN:  No Specimen  DISPOSITION OF SPECIMEN:  N/A  COUNTS:  YES  TOURNIQUET:  * No tourniquets in log *  DICTATION: Patient is 70 year old man with prior decompression at L 45 and at L 5 S 1 with severe recurrent lumbar stenosis and foraminal stenosis with flat back. It was elected to take him to surgery for redo decompression and discectomy and fusion at the L 45 and  L 5 S 1 levels.   Procedure: Patient was placed in a prone position on the [place] table after smooth and uncomplicated induction of general endotracheal anesthesia. His low back was prepped and draped in usual sterile fashion with betadine scrub and DuraPrep after localizing images were obtained with Pulse. Area of incision was infiltrated with local lidocaine. Incision was made to the lumbodorsal fascia was incised and exposure was performed of the L 45,  L 5, S 1 transverse processes and facet joints.Confirmation of level was  performed with intraoperative X ray. Redo laminectomy with disarticulation of facet joints at L 45 and L 5 S 1 levels.  After thorough decompression of all neural elements including thecal sac and bilateral L 4, L 5,  S 1 nerve roots, thorough bilateral discectomies were performed at the L 45 and L 5  S 1 levels with preparation of the endplates.  interbody grafts were placed (8 x 9 x 23 mm x 12 degree) at both the L 45 and L 5 S 1 levels.  Cages were packed with local autograft and extra small BMP.  8 cc of autograft was placed medial to the second cage at each level. pedicle screws were placed at L 4, L 5, S 1 levels (6.5 x 45 mm at L4, 6.5 x 40 mm at L 5 , 7.5 x 40 mm at S 1).  Intraoperative reduced dose fluoroscopy confirmed correct orientationin the AP and lateral plane.  Final x-rays demonstrated well-positioned pedicle screw fixation.  65 mm lordotic rods were placed bilaterally and locked down in situ.  Posterolateral region was decorticated and packed with 20 cc local autograft on the right.  Long-acting Marcaine was injected in the soft tissues.  Fascia was closed with 1 Vicryl sutures skin edges were reapproximated 2 and 3-0 Vicryl sutures. The wound is dressed with Dermabond and occlusive dressing.  The patient was extubated in the operating room and taken to recovery in stable satisfactory condition. Counts were correct at the end of the case.   PLAN OF CARE: Admit to inpatient   PATIENT DISPOSITION:  PACU - hemodynamically stable.  Delay start of Pharmacological VTE agent (>24hrs) due to surgical blood loss or risk of bleeding: yes

## 2020-12-06 NOTE — Anesthesia Procedure Notes (Signed)
Procedure Name: Intubation Date/Time: 12/06/2020 11:37 AM Performed by: Lance Coon, CRNA Pre-anesthesia Checklist: Patient identified, Emergency Drugs available, Suction available, Patient being monitored and Timeout performed Patient Re-evaluated:Patient Re-evaluated prior to induction Oxygen Delivery Method: Circle system utilized Preoxygenation: Pre-oxygenation with 100% oxygen Induction Type: IV induction Ventilation: Mask ventilation without difficulty Laryngoscope Size: Miller and 3 Grade View: Grade I Tube type: Oral Tube size: 7.5 mm Number of attempts: 1 Airway Equipment and Method: Stylet Placement Confirmation: ETT inserted through vocal cords under direct vision, positive ETCO2 and breath sounds checked- equal and bilateral Secured at: 21 cm Tube secured with: Tape Dental Injury: Teeth and Oropharynx as per pre-operative assessment

## 2020-12-06 NOTE — Brief Op Note (Signed)
12/06/2020  3:41 PM  PATIENT:  Robert Ramos  70 y.o. male  PRE-OPERATIVE DIAGNOSIS:  Spinal stenosis, Lumbar region with neurogenic claudication, lumbar foraminal stenosis, disc degeneration, recurrent spinal stenosis, lumbago, radiculopathy L 45, L 5 S1 levels  POST-OPERATIVE DIAGNOSIS:  Spinal stenosis, Lumbar region with neurogenic claudication, lumbar foraminal stenosis, disc degeneration, recurrent spinal stenosis, lumbago, radiculopathy L 45, L 5 S1 levels  PROCEDURE:  Procedure(s): Lumbar four-fiveLumbar five Sacral one Posterior lateral and interbody fusion (N/A) APPLICATION OF PULSE with PEEK cages, autograft, pedicle screw fixation, posterolateral arthrodesis with autograft  SURGEON:  Surgeon(s) and Role:    Erline Levine, MD - Primary    * Ashok Pall, MD - Assisting  PHYSICIAN ASSISTANT: Glenford Peers, NP  ASSISTANTS: Poteat, RN   ANESTHESIA:   general  EBL:  300 mL   BLOOD ADMINISTERED:none  DRAINS: none   LOCAL MEDICATIONS USED:  MARCAINE    and LIDOCAINE   SPECIMEN:  No Specimen  DISPOSITION OF SPECIMEN:  N/A  COUNTS:  YES  TOURNIQUET:  * No tourniquets in log *  DICTATION: Patient is 70 year old man with prior decompression at L 45 and at L 5 S 1 with severe recurrent lumbar stenosis and foraminal stenosis with flat back. It was elected to take him to surgery for redo decompression and discectomy and fusion at the L 45 and  L 5 S 1 levels.   Procedure: Patient was placed in a prone position on the [place] table after smooth and uncomplicated induction of general endotracheal anesthesia. His low back was prepped and draped in usual sterile fashion with betadine scrub and DuraPrep after localizing images were obtained with Pulse. Area of incision was infiltrated with local lidocaine. Incision was made to the lumbodorsal fascia was incised and exposure was performed of the L 45,  L 5, S 1 transverse processes and facet joints.Confirmation of level was  performed with intraoperative X ray. Redo laminectomy with disarticulation of facet joints at L 45 and L 5 S 1 levels.  After thorough decompression of all neural elements including thecal sac and bilateral L 4, L 5,  S 1 nerve roots, thorough bilateral discectomies were performed at the L 45 and L 5  S 1 levels with preparation of the endplates.  interbody grafts were placed (8 x 9 x 23 mm x 12 degree) at both the L 45 and L 5 S 1 levels.  Cages were packed with local autograft and extra small BMP.  8 cc of autograft was placed medial to the second cage at each level. pedicle screws were placed at L 4, L 5, S 1 levels (6.5 x 45 mm at L4, 6.5 x 40 mm at L 5 , 7.5 x 40 mm at S 1).  Intraoperative reduced dose fluoroscopy confirmed correct orientationin the AP and lateral plane.  Final x-rays demonstrated well-positioned pedicle screw fixation.  65 mm lordotic rods were placed bilaterally and locked down in situ.  Posterolateral region was decorticated and packed with 20 cc local autograft on the right.  Long-acting Marcaine was injected in the soft tissues.  Fascia was closed with 1 Vicryl sutures skin edges were reapproximated 2 and 3-0 Vicryl sutures. The wound is dressed with Dermabond and occlusive dressing.  The patient was extubated in the operating room and taken to recovery in stable satisfactory condition. Counts were correct at the end of the case.   PLAN OF CARE: Admit to inpatient   PATIENT DISPOSITION:  PACU - hemodynamically stable.  Delay start of Pharmacological VTE agent (>24hrs) due to surgical blood loss or risk of bleeding: yes

## 2020-12-06 NOTE — Interval H&P Note (Signed)
History and Physical Interval Note:  12/06/2020 10:04 AM  Robert Ramos  has presented today for surgery, with the diagnosis of Spinal stenosis, Lumbar region with neurogenic claudication.  The various methods of treatment have been discussed with the patient and family. After consideration of risks, benefits and other options for treatment, the patient has consented to  Procedure(s): Lumbar 4-5 Lumbar 5 Sacral 1 Posterior lateral and interbody fusion (N/A) as a surgical intervention.  The patient's history has been reviewed, patient examined, no change in status, stable for surgery.  I have reviewed the patient's chart and labs.  Questions were answered to the patient's satisfaction.     Peggyann Shoals

## 2020-12-07 MED ORDER — HYDROMORPHONE HCL 2 MG PO TABS: 2.0000 mg | ORAL_TABLET | ORAL | 0 refills | Status: DC | PRN

## 2020-12-07 MED ORDER — ONDANSETRON HCL 4 MG PO TABS: 4.0000 mg | ORAL_TABLET | Freq: Every day | ORAL | 1 refills | Status: DC | PRN

## 2020-12-07 MED ORDER — CLOPIDOGREL BISULFATE 75 MG PO TABS: ORAL_TABLET | ORAL | 2 refills | Status: DC

## 2020-12-07 MED ORDER — OXYCODONE HCL 5 MG PO TABS: 5.0000 mg | ORAL_TABLET | ORAL | 0 refills | Status: DC | PRN

## 2020-12-07 NOTE — Progress Notes (Signed)
Neurosurgery Service Progress Note  Subjective: No acute events overnight, preop symptoms improved, no new complaints   Objective: Vitals:   12/06/20 1956 12/07/20 0003 12/07/20 0512 12/07/20 0807  BP: 101/71 99/66 105/69 113/74  Pulse: 66 72 69 70  Resp: '20 18 20 17  '$ Temp: 97.6 F (36.4 C) 97.6 F (36.4 C) 97.9 F (36.6 C) 98.1 F (36.7 C)  TempSrc: Oral Oral Oral Oral  SpO2: 100% 98% 98% 99%  Weight:      Height:        Physical Exam: Strength 5/5 x4, SILTx4, incision c/di/  Assessment & Plan: 70 y.o. man s/p L4-5/L5-S1 PLIF, recovering well.  -discharge home today  Judith Part  12/07/20 8:28 AM

## 2020-12-07 NOTE — Discharge Summary (Signed)
Discharge Summary  Date of Admission: 12/06/2020  Date of Discharge: 12/07/20  Attending Physician: Erline Levine, MD  Hospital Course: Patient was admitted following an uncomplicated [id number] PLIF. He was recovered in PACU and transferred to [facility]. His hospital course was uncomplicated and the patient was discharged home on 12/07/20. He will follow up in clinic with Dr. Vertell Limber in 2 weeks.  Neurologic exam at discharge:  Strength 5/5 x4, SILTx4  Discharge diagnosis: Lumbar radiculopathy  Judith Part, MD 12/07/20 8:34 AM

## 2020-12-07 NOTE — Evaluation (Signed)
Occupational Therapy Evaluation Patient Details Name: Robert Ramos MRN: [id number] DOB: [birth year] Today's Date: 12/07/2020    History of Present Illness 70 y.o. male admitted on 12/06/2020 for PLIF.  PMH includes: Arthritis, Gout, HTN, Hyperlipidemia, CAD.   Clinical Impression   Patient admitted for the above diagnosis and procedure above.  The patient lives with his spouse, who can assist as needed.  Currently he is at, or near, his baseline for ADL and in room mobility.  No OT needs in the acute setting.  All questions answered, and good understanding of all back precautions.      Follow Up Recommendations  No OT follow up    Equipment Recommendations  None recommended by OT    Recommendations for Other Services       Precautions / Restrictions Precautions Precautions: Back Precaution Booklet Issued: Yes (comment) Precaution Comments: Reviewed with good understanding vewrbalized by patient. Required Braces or Orthoses: Spinal Brace Spinal Brace: Lumbar corset;Applied in sitting position;Applied in standing position Restrictions Weight Bearing Restrictions: No      Mobility Bed Mobility Overal bed mobility: Modified Independent               Patient Response: Cooperative  Transfers Overall transfer level: Independent                    Balance Overall balance assessment: No apparent balance deficits (not formally assessed)                                         ADL either performed or assessed with clinical judgement   ADL Overall ADL's : At baseline                                       General ADL Comments: Able to complete ADL from sit/stand level.  Independent with in room mobility     Vision Patient Visual Report: No change from baseline       Perception     Praxis      Pertinent Vitals/Pain Pain Assessment: 0-10 Pain Score: 4  Pain Descriptors / Indicators: Operative site guarding Pain  Intervention(s): Monitored during session     Hand Dominance Right   Extremity/Trunk Assessment Upper Extremity Assessment Upper Extremity Assessment: Overall WFL for tasks assessed   Lower Extremity Assessment Lower Extremity Assessment: Defer to PT evaluation   Cervical / Trunk Assessment Cervical / Trunk Assessment: Other exceptions Cervical / Trunk Exceptions: spine surgery   Communication Communication Communication: No difficulties   Cognition Arousal/Alertness: Awake/alert Behavior During Therapy: WFL for tasks assessed/performed Overall Cognitive Status: Within Functional Limits for tasks assessed                                                      Home Living Family/patient expects to be discharged to:: Private residence Living Arrangements: Spouse/significant other Available Help at Discharge: Family;Available 24 hours/day Type of Home: House Home Access: Stairs to enter [employer] of Steps: 3 Entrance Stairs-Rails: Right Home Layout: One level     Bathroom Shower/Tub: Occupational psychologist: Standard     Home Equipment: Research scientist (life sciences);Shower  seat;Grab bars - tub/shower;Hand held shower head Adaptive Equipment: Sock aid;Reacher        Prior Functioning/Environment Level of Independence: Independent                 OT Problem List: Impaired balance (sitting and/or standing);Pain      OT Treatment/Interventions:      OT Goals(Current goals can be found in the care plan section) Acute Rehab OT Goals Patient Stated Goal: Return home and recover OT Goal Formulation: With patient Time For Goal Achievement: 12/07/20 Potential to Achieve Goals: Good  OT Frequency:     Barriers to D/C:  None noted          Co-evaluation              AM-PAC OT "6 Clicks" Daily Activity     Outcome Measure Help from another person eating meals?: None Help from another person taking care of personal  grooming?: None Help from another person toileting, which includes using toliet, bedpan, or urinal?: None Help from another person bathing (including washing, rinsing, drying)?: None Help from another person to put on and taking off regular upper body clothing?: None Help from another person to put on and taking off regular lower body clothing?: None 6 Click Score: 24   End of Session Equipment Utilized During Treatment: Back brace Nurse Communication: Mobility status  Activity Tolerance: Patient tolerated treatment well Patient left: in bed;with call bell/phone within reach;with family/visitor present  OT Visit Diagnosis: Unsteadiness on feet (R26.81);Pain Pain - Right/Left:  (low back)                Time: [id number] OT Time Calculation (min): 23 min Charges:  OT General Charges $OT Visit: 1 Visit OT Evaluation $OT Eval Moderate Complexity: 1 Mod OT Treatments $Self Care/Home Management : 8-22 mins  12/07/2020  RP, OTR/L  Acute Rehabilitation Services  Office:  [phone]   Metta Clines 12/07/2020, 8:35 AM

## 2020-12-07 NOTE — Progress Notes (Signed)
Patient is discharged from room 3C02 at this time. Alert and in stable condition. IV site d/c'd and instructions read to patient and spouse with understanding verbalized and all questions answered. Left unit via wheelchair with all belongings at side.  ?

## 2020-12-07 NOTE — Evaluation (Signed)
Physical Therapy Evaluation Patient Details Name: Robert Ramos MRN: [id number] DOB: April 02, 1951 Today's Date: 12/07/2020   History of Present Illness  70 y.o. male admitted on 12/06/2020 for L4-5/L5-S1  PLIF.  PMH includes: Arthritis, Gout, HTN, Hyperlipidemia, CAD, previous lumbar surgery.  Clinical Impression  Patient is s/p above surgery resulting in the deficits listed below (see PT Problem List).Patient is mobilizing well and is discharging home with his wife shortly.  I have encouraged the patient to gradually increase activity daily to tolerance.   I have answered all patient's question regarding PT and mobility.    Pt feels ready for DC home today.       Follow Up Recommendations No PT follow up;Supervision for mobility/OOB    Equipment Recommendations  None recommended by PT    Recommendations for Other Services       Precautions / Restrictions Precautions Precautions: Back Precaution Booklet Issued: Yes (comment) Precaution Comments: reviewed and educted on back precautions Required Braces or Orthoses: Spinal Brace Spinal Brace: Lumbar corset;Applied in sitting position (Pt donned and doffed without difficulty) Restrictions Weight Bearing Restrictions: No      Mobility  Bed Mobility Overal bed mobility: Modified Independent             General bed mobility comments: used log rolling technique without needing cues    Transfers Overall transfer level: Modified independent Equipment used: None                Ambulation/Gait Ambulation/Gait assistance: Supervision Gait Distance (Feet): 160 Feet Assistive device: None Gait Pattern/deviations: Step-through pattern;Decreased stride length Gait velocity: decresed gait speed, not formally measured   General Gait Details: no loss of balance with basic gait activities, including opening doors  Stairs Stairs: Yes Stairs assistance: Supervision Stair Management: One rail Right;Step to  pattern;Forwards Number of Stairs: 3 General stair comments: Patient showed safe technique without needing cues  Wheelchair Mobility    Modified Rankin (Stroke Patients Only)       Balance Overall balance assessment: No apparent balance deficits (not formally assessed)                                           Pertinent Vitals/Pain Pain Assessment: 0-10 Pain Score: 6  Pain Location: low back Pain Descriptors / Indicators: Operative site guarding Pain Intervention(s): Limited activity within patient's tolerance;Monitored during session;Patient requesting pain meds-RN notified    Home Living Family/patient expects to be discharged to:: Private residence Living Arrangements: Spouse/significant other Available Help at Discharge: Family;Available 24 hours/day Type of Home: House Home Access: Stairs to enter Entrance Stairs-Rails: Right Entrance Stairs-Number of Steps: 3 Home Layout: One level Home Equipment: Adaptive equipment;Shower seat;Grab bars - tub/shower;Hand held shower head      Prior Function Level of Independence: Independent               Hand Dominance   Dominant Hand: Right    Extremity/Trunk Assessment   Upper Extremity Assessment Upper Extremity Assessment: Defer to OT evaluation    Lower Extremity Assessment Lower Extremity Assessment: Generalized weakness    Cervical / Trunk Assessment Cervical / Trunk Assessment: Other exceptions Cervical / Trunk Exceptions: spine surgery  Communication   Communication: No difficulties  Cognition Arousal/Alertness: Awake/alert Behavior During Therapy: WFL for tasks assessed/performed Overall Cognitive Status: Within Functional Limits for tasks assessed  General Comments General comments (skin integrity, edema, etc.): pt's wife present throughout treatment    Exercises     Assessment/Plan    PT Assessment Patent does not  need any further PT services  PT Problem List         PT Treatment Interventions      PT Goals (Current goals can be found in the Care Plan section)  Acute Rehab PT Goals Patient Stated Goal: Return home and recover    Frequency     Barriers to discharge        Co-evaluation               AM-PAC PT "6 Clicks" Mobility  Outcome Measure Help needed turning from your back to your side while in a flat bed without using bedrails?: None Help needed moving from lying on your back to sitting on the side of a flat bed without using bedrails?: None Help needed moving to and from a bed to a chair (including a wheelchair)?: A Little Help needed standing up from a chair using your arms (e.g., wheelchair or bedside chair)?: None Help needed to walk in hospital room?: A Little Help needed climbing 3-5 steps with a railing? : A Little 6 Click Score: 21    End of Session Equipment Utilized During Treatment: Gait belt Activity Tolerance: Patient tolerated treatment well Patient left: in bed;with call bell/phone within reach;with family/visitor present Nurse Communication: Mobility status;Patient requests pain meds PT Visit Diagnosis: Other abnormalities of gait and mobility (R26.89)    Time: [id number] PT Time Calculation (min) (ACUTE ONLY): 12 min   Charges:   PT Evaluation $PT Eval Low Complexity: [place], PT   Acute Rehabilitation Services  Pager [phone] Office [phone] 12/07/2020   Melvern Banker 12/07/2020, 9:51 AM

## 2020-12-09 MED FILL — Sodium Chloride IV Soln 0.9%: INTRAVENOUS | Qty: 1000 | Status: AC

## 2020-12-09 MED FILL — Heparin Sodium (Porcine) Inj 1000 Unit/ML: INTRAMUSCULAR | Qty: 30 | Status: AC

## 2020-12-09 NOTE — Anesthesia Postprocedure Evaluation (Signed)
Anesthesia Post Note  Patient: Robert Ramos  Procedure(s) Performed: Lumbar four-fiveLumbar five Sacral one Posterior lateral and interbody fusion (Back) APPLICATION OF PULSE     Patient location during evaluation: PACU Anesthesia Type: General Level of consciousness: sedated Pain management: pain level controlled Vital Signs Assessment: post-procedure vital signs reviewed and stable Respiratory status: spontaneous breathing Cardiovascular status: stable Postop Assessment: no apparent nausea or vomiting Anesthetic complications: no   No notable events documented.  Last Vitals:  Vitals:   12/07/20 0512 12/07/20 0807  BP: 105/69 113/74  Pulse: 69 70  Resp: 20 17  Temp: 36.6 C 36.7 C  SpO2: 98% 99%    Last Pain:  Vitals:   12/07/20 1028  TempSrc:   PainSc: 4    Pain Goal: Patients Stated Pain Goal: 4 (12/06/20 1539)                 Huston Foley

## 2020-12-11 HISTORY — PX: LUMBAR FUSION: SHX111

## 2020-12-20 NOTE — Progress Notes (Signed)
Assessment/Plan:    Essential Tremor, overall mild.  -This is evidenced by the symmetrical nature and longstanding hx of gradually getting worse.  We discussed nature and pathophysiology.  We discussed that this can continue to gradually get worse with time.  We discussed that some medications can worsen this, as can caffeine use.  We discussed medication therapy as well as surgical therapy.  Ultimately, the patient decided to talk with his cardiologist and see if it was reasonable to change the metoprolol to propranolol.  If not, he will call me back and we will restart his primidone and work to 50 mg twice daily (previously just on 50 mg daily. -Reassured no evidence of Parkinson's disease.    2.  b12 deficiency  -last checked one year ago and was 242.  He takes a supplement but only once a week.  Going to recheck that as well as B1, given the tremulous episodes that he has when he wakes up in the morning.  He does binge drink on the weekend.    3.  Mild white matter disease  -Discussed MRI with patient.  This is not unexpected.  Patient has history of coronary artery disease, hypertension, hyperlipidemia, prediabetes, history of tobacco abuse.  Subjective:   Robert Ramos was seen today in the movement disorders clinic for neurologic consultation at the request of Brown-Patram, Melissa J*.  The consultation is for the evaluation of tremor.  Medical records made available to me are reviewed.  Last saw primary care August 18.  At that visit, was complaining about tremor.  Tremor: Yes.     How long has it been going on? 6-8 months at least  At rest or with activation?  Activation mostly but feels it when laying down at night  When is it noted the most?  Eating, drinking  Fam hx of tremor?  No.  Located where?  Bilateral UE ,but R handed  Affected by caffeine:  No. (2 cups coffee/week)  Affected by alcohol:  increases the tremor the following day (drinks 12 pack on weekend but  doesn't drink in the week)  Affected by stress:  No.  Affected by fatigue:  Yes.    Spills soup if on spoon:  No.  Spills glass of liquid if full:  No.  Affects ADL's (tying shoes, brushing teeth, etc):  No.  Tremor inducing meds:  Yes.   albuterol  Tremor improving medications: Primidone, 50 mg daily (he is off of it now; took it for 1 month, didn't help)  Other Specific Symptoms:  Voice: speaking louder d/t losing hearing Sleep:   Vivid Dreams:  Yes.    Acting out dreams:  No. Wet Pillows: No. Postural symptoms:  Yes.  , attributes to back  Falls?  No. Bradykinesia symptoms: difficulty getting out of a chair (due to back issues); no shuffling (was prior to back surgery 2 weeks ago) Loss of smell:  No. Loss of taste:  No. Urinary Incontinence:  No. Difficulty Swallowing:  No. Handwriting, micrographia: Yes.   Trouble with ADL's:  No.  Trouble buttoning clothing: No. Depression:  No. Memory changes:  No. N/V:  No. Lightheaded:  No.  Syncope: No. Diplopia:  No. Dyskinesia:  No.  Neuroimaging of the brain has previously been performed.  It is available for my review today.  There was mild WMD (HTN, prior smoker, HLD)  PREVIOUS MEDICATIONS: Primidone, 50 mg daily, took for 1 month then DC'd as did not find helpful  ALLERGIES:  Allergies  Allergen Reactions   Penicillin G Anaphylaxis, Swelling and Rash    Has patient had a PCN reaction causing immediate rash, facial/tongue/throat swelling, SOB or lightheadedness with hypotension:Yes Has patient had a PCN reaction causing severe rash involving mucus membranes or skin necrosis:Yes--all over body Has patient had a PCN reaction that required hospitalization:No--treated in ER Has patient had a PCN reaction occurring within the last 10 years:No If all of the above answers are "NO", then may proceed with Cephalosporin use.    Penicillins Anaphylaxis   Hydrocodone Nausea Only    Sick on stomach all day long    Oxycodone Nausea  Only    Sick on stomach all day long    Zolpidem Other (See Comments)    dizziness    CURRENT MEDICATIONS:  Current Outpatient Medications  Medication Instructions   allopurinol (ZYLOPRIM) 100 mg, Oral, Daily   amLODipine (NORVASC) 10 mg, Oral, Daily   aspirin EC 81 mg, Oral, Daily   clopidogrel (PLAVIX) 75 MG tablet TAKE ONE TABLET BY MOUTH ONCE DAILY WITH BREAKFAST<BR>Restart on 12/13/20   gabapentin (NEURONTIN) 300 mg, Oral, 3 times daily   HYDROmorphone (DILAUDID) 2 mg, Oral, Every 4 hours PRN   isosorbide mononitrate (IMDUR) 120 mg, Oral, Daily   lisinopril (ZESTRIL) 2.5 mg, Oral, Daily   metoprolol tartrate (LOPRESSOR) 50 MG tablet TAKE 1 AND 1/2 TABLETS BY MOUTH TWICE DAILY   nitroGLYCERIN (NITROSTAT) 0.4 MG SL tablet DISSOLVE 1 TABLET UNDER THE TONGUE EVERY 5 MINUTES AS NEEDED FOR CHEST PAIN. DO NOT EXCEED A TOTAL OF 3 DOSES IN 15 MINUTES.   ondansetron (ZOFRAN) 4 mg, Oral, Daily PRN   primidone (MYSOLINE) 50 mg, Oral, Daily at bedtime   ranolazine (RANEXA) 1,000 mg, Oral, 2 times daily   rosuvastatin (CRESTOR) 40 MG tablet TAKE ONE TABLET BY MOUTH ONCE DAILY   Sodium Sulfate-Mag Sulfate-KCl (SUTAB) [phone] MG TABS 1 tablet, Oral, Daily PRN   VASCEPA 1 g capsule Take 2 capsules (2 g total) by mouth 2 times daily at 12 noon and 4 pm.   Vitamin D (Ergocalciferol) (DRISDOL) 50,000 Units, Oral, Every Sun    Objective:   PHYSICAL EXAMINATION:    VITALS:   Vitals:   12/24/20 0828  BP: 122/65  Pulse: 67  SpO2: 97%  Weight: 188 lb 3.2 oz (85.4 kg)  Height: '5\' 5"'$  (1.651 m)    GEN:  The patient appears stated age and is in NAD. HEENT:  Normocephalic, atraumatic.  The mucous membranes are moist. The superficial temporal arteries are without ropiness or tenderness. CV:  RRR Lungs:  CTAB Neck/HEME:  There are no carotid bruits bilaterally.  Neurological examination:  Orientation: The patient is alert and oriented x3.  Cranial nerves: There is good facial symmetry.   Extraocular muscles are intact. The visual fields are full to confrontational testing. The speech is fluent and clear. Soft palate rises symmetrically and there is no tongue deviation. Hearing is intact to conversational tone. Sensation: Sensation is intact to light touch throughout (facial, trunk, extremities). Vibration is intact at the bilateral big toe. There is no extinction with double simultaneous stimulation.  Motor: Strength is 5/5 in the bilateral upper and lower extremities.   Shoulder shrug is equal and symmetric.  There is no pronator drift. Deep tendon reflexes: Deep tendon reflexes are 2/4 at the bilateral biceps, triceps, brachioradialis, 1/4 at the bilateral patella and achilles. Plantar responses are downgoing bilaterally.  Movement examination: Tone: There is nl tone in the bilateral upper  extremities.  The tone in the lower extremities is nl.  Abnormal movements: no rest tremor.  No postural tremor.  Mild intention tremor.  Mild tremor when pouring water from 1 glass to another (patient does state that he has more trouble when actually drinking the water). Coordination:  There is no decremation with RAM's, with any form of RAMS, including alternating supination and pronation of the forearm, hand opening and closing, finger taps, heel taps and toe taps. Gait and Station: The patient pushes off to arise.  He just had back surgery 2 weeks ago.  He is somewhat slow with ambulation because of the back surgery.  He does not shuffle.  He is slow and tenuous with ambulation. I have reviewed and interpreted the following labs independently   Chemistry      Component Value Date/Time   NA 136 11/29/2020 1012   NA 139 02/28/2018 1002   K 4.4 11/29/2020 1012   CL 104 11/29/2020 1012   CO2 24 11/29/2020 1012   BUN 17 11/29/2020 1012   BUN 10 02/28/2018 1002   CREATININE 1.08 11/29/2020 1012      Component Value Date/Time   CALCIUM 9.3 11/29/2020 1012   ALKPHOS 56 11/29/2020 1012   AST  33 11/29/2020 1012   ALT 34 11/29/2020 1012   BILITOT 0.3 11/29/2020 1012   BILITOT 0.3 05/17/2018 1002      Lab Results  Component Value Date   TSH 1.305 09/08/2017   Lab Results  Component Value Date   WBC 7.2 11/29/2020   HGB 13.1 11/29/2020   HCT 40.5 11/29/2020   MCV 91.8 11/29/2020   PLT 211 11/29/2020    Last TSH was November 28, 2020 it was 1.15.  Total time spent on today's visit was 25mnutes, including both face-to-face time and nonface-to-face time.  Time included that spent on review of records (prior notes available to me/labs/imaging if pertinent), discussing treatment and goals, answering patient's questions and coordinating care.  Cc:  GRaina Mina, MD

## 2020-12-24 ENCOUNTER — Ambulatory Visit: Payer: PPO | Admitting: Neurology

## 2020-12-24 ENCOUNTER — Other Ambulatory Visit: Payer: Self-pay

## 2020-12-24 ENCOUNTER — Encounter: Payer: Self-pay | Admitting: Neurology

## 2020-12-24 VITALS — BP 122/65 | HR 67 | Ht 65.0 in | Wt 188.2 lb

## 2020-12-24 DIAGNOSIS — G250 Essential tremor: Secondary | ICD-10-CM | POA: Diagnosis not present

## 2020-12-24 DIAGNOSIS — E538 Deficiency of other specified B group vitamins: Secondary | ICD-10-CM

## 2020-12-24 NOTE — Patient Instructions (Addendum)
You talk with your cardiologist and see if its appropriate to change metoprolol to propranolol as it can help tremor and blood pressure.  If not, we will restart primidone and work to a higher dosage.  Your provider has requested that you have labwork completed today. The lab is located on the Second floor at [place], within the [facility] office. When you get off the elevator, turn right and go in the [facility] [address]; the first brown door on the left.  Tell the ladies behind the desk that you are there for lab work. If you are not called within 15 minutes please check with the front desk.   Once you complete your labs you are free to go. You will receive a call or message via MyChart with your lab results.

## 2021-01-01 ENCOUNTER — Ambulatory Visit: Payer: PPO | Admitting: Cardiology

## 2021-01-01 ENCOUNTER — Other Ambulatory Visit: Payer: Self-pay

## 2021-01-01 ENCOUNTER — Encounter: Payer: Self-pay | Admitting: Cardiology

## 2021-01-01 VITALS — BP 88/58 | HR 97 | Ht 65.0 in | Wt 187.2 lb

## 2021-01-01 DIAGNOSIS — E785 Hyperlipidemia, unspecified: Secondary | ICD-10-CM | POA: Diagnosis not present

## 2021-01-01 DIAGNOSIS — I25118 Atherosclerotic heart disease of native coronary artery with other forms of angina pectoris: Secondary | ICD-10-CM | POA: Diagnosis not present

## 2021-01-01 DIAGNOSIS — I10 Essential (primary) hypertension: Secondary | ICD-10-CM | POA: Diagnosis not present

## 2021-01-01 NOTE — Patient Instructions (Addendum)
Medication Instructions:  Your physician has recommended you make the following change in your medication:  STOP: Lisinopril *If you need a refill on your cardiac medications before your next appointment, please call your pharmacy*   Lab Work: None If you have labs (blood work) drawn today and your tests are completely normal, you will receive your results only by: [place] (if you have MyChart) OR A paper copy in the mail If you have any lab test that is abnormal or we need to change your treatment, we will call you to review the results.   Testing/Procedures: None   Follow-Up: At [facility], you and your health needs are our priority.  As part of our continuing mission to provide you with exceptional heart care, we have created designated Provider Care Teams.  These Care Teams include your primary Cardiologist (physician) and Advanced Practice Providers (APPs -  Physician Assistants and Nurse Practitioners) who all work together to provide you with the care you need, when you need it.  We recommend signing up for the patient portal called "MyChart".  Sign up information is provided on this After Visit Summary.  MyChart is used to connect with patients for Virtual Visits (Telemedicine).  Patients are able to view lab/test results, encounter notes, upcoming appointments, etc.  Non-urgent messages can be sent to your provider as well.   To learn more about what you can do with MyChart, go to [email or web address].    Your next appointment:   6 month(s)  The format for your next appointment:   In Person  Provider:   Jenne Campus, MD   Other Instructions

## 2021-01-01 NOTE — Progress Notes (Signed)
Cardiology Office Note:    Date:  01/01/2021   ID:  Robert Ramos, DOB 06-25-1950, MRN [id number]  PCP:  [name], MD  Cardiologist:  Jenne Campus, MD    Referring MD: [name], MD   Chief Complaint  Patient presents with   Follow-up  I had my back surgery done and very happy after  History of Present Illness:    Robert Ramos is a 70 y.o. male  with coronary artery disease status post PCI of the LAD in 2018, status post PCI of the posterior lateral branch of the RCA in May 2019, hypertension, hyperlipidemia.  He has had continued symptoms of exertional chest discomfort and shortness of breath at low level activity despite treatment with multidrug antianginal therapy.  Recent stress testing was abnormal.  He was referred to Dr. Burt Knack for further evaluation.  Cardiac catheterization demonstrated moderate LAD stenosis unchanged from previous study, patent LCx with small vessel disease involving the OM branches not amenable to PCI and interval occlusion of the mid RCA with left-to-right collaterals.  Patient was referred to Dr. Martinique for possible CTO PCI of the RCA.  He underwent attempted CTO PCI of the RCA but this was unsuccessful.  Continued medical therapy was recommended. 3 months ago he did have back surgery.  He is very happy after surgery he said to have slowed there was no issue during the surgery with no cardiac problems during the time.  He said he is able to do more and he is more active.  Denies have any cardiac complaints.  There is no chest pain tightness squeezing pressure burning chest.  Past Medical History:  Diagnosis Date   Anemia 09/28/2019   Arthritis    "hands, back" (03/02/2018)   ASHD (arteriosclerotic heart disease) 08/27/2015   Body mass index (BMI) 34.0-34.9, adult 03/15/2019   Chronic bilateral low back pain with bilateral sciatica 01/22/2020   Chronic pain of left knee 09/28/2019   Chronic pain syndrome 01/22/2020   Chronic  radicular low back pain    Coronary artery disease    a. MI in 2002 s/p stenting to mRCA b.cath 11/2016 s/p DES to mLAD c. Cath 08/2017- patent LAD stent, 40% instent restenosis of mRCA, 99% posterolateral artery s/p PTCA & DES.   Disc displacement, lumbar 05/10/2019   Dyslipidemia 11/17/2016   Essential (primary) hypertension 03/15/2019   Essential hypertension 08/27/2015   Gout    "on RX qod" (03/02/2018)   Heart attack ([place]) 2002   History of bacterial pneumonia 07/22/2020   History of kidney stones    Hyperlipidemia    Hypertension    Hypogonadism in male 08/27/2015   Kidney stone 08/27/2015   Long-term use of high-risk medication 08/27/2015   Lumbago with sciatica, right side 01/22/2020   Lumbar post-laminectomy syndrome 06/08/2019   Malaise and fatigue 08/27/2015   Mixed hyperlipidemia 08/27/2015   Non-seasonal allergic rhinitis 07/04/2018   Obstructive sleep apnea 11/17/2016   Pneumonia    Pre-diabetes    Precordial chest pain 11/24/2017   Prediabetes 08/27/2015   Shortness of breath 11/17/2016   Spinal stenosis, 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   Stage 3 chronic kidney disease ([place]) 08/27/2015   Tremor 05/30/2020    Past Surgical History:  Procedure Laterality Date   APPENDECTOMY  1974   BACK SURGERY     CORONARY ANGIOPLASTY WITH STENT PLACEMENT  2002; 11/20/2016   "@ [facility]; @ [place]"   CORONARY CTO INTERVENTION N/A 03/02/2018  Procedure: CORONARY CTO INTERVENTION - Right;  Surgeon: Martinique, Peter M, MD;  Location: [place] [facility];  Service: Cardiovascular;  Laterality: N/A;   CORONARY STENT INTERVENTION N/A 11/20/2016   Procedure: CORONARY STENT INTERVENTION;  Surgeon: Nelva Bush, MD;  Location: [place] [facility];  Service: Cardiovascular;  Laterality: N/A;   CORONARY STENT INTERVENTION N/A 09/09/2017   Procedure: CORONARY STENT INTERVENTION;  Surgeon: Burnell Blanks, MD;  Location: [place] [facility];  Service:  Cardiovascular;  Laterality: N/A;   HEMI-MICRODISCECTOMY LUMBAR LAMINECTOMY LEVEL 1 Left 2008   L4   INTRAVASCULAR PRESSURE WIRE/FFR STUDY N/A 09/16/2017   Procedure: INTRAVASCULAR PRESSURE WIRE/FFR STUDY;  Surgeon: Belva Crome, MD;  Location: [place] [facility];  Service: Cardiovascular;  Laterality: N/A;   INTRAVASCULAR ULTRASOUND/IVUS N/A 11/20/2016   Procedure: Intravascular Ultrasound/IVUS;  Surgeon: Nelva Bush, MD;  Location: [place] [facility];  Service: Cardiovascular;  Laterality: N/A;   KNEE ARTHROSCOPY Left 1983   LEFT HEART CATH AND CORONARY ANGIOGRAPHY N/A 11/20/2016   Procedure: LEFT HEART CATH AND CORONARY ANGIOGRAPHY;  Surgeon: Nelva Bush, MD;  Location: [place] [facility];  Service: Cardiovascular;  Laterality: N/A;   LEFT HEART CATH AND CORONARY ANGIOGRAPHY N/A 09/09/2017   Procedure: LEFT HEART CATH AND CORONARY ANGIOGRAPHY;  Surgeon: Burnell Blanks, MD;  Location: [place] [facility];  Service: Cardiovascular;  Laterality: N/A;   LEFT HEART CATH AND CORONARY ANGIOGRAPHY N/A 09/16/2017   Procedure: LEFT HEART CATH AND CORONARY ANGIOGRAPHY;  Surgeon: Belva Crome, MD;  Location: [place] [facility];  Service: Cardiovascular;  Laterality: N/A;   LEFT HEART CATH AND CORONARY ANGIOGRAPHY N/A 02/21/2018   Procedure: LEFT HEART CATH AND CORONARY ANGIOGRAPHY;  Surgeon: Sherren Mocha, MD;  Location: [place] [facility];  Service: Cardiovascular;  Laterality: N/A;   LUMBAR FUSION  12/11/2020   L1-2-3-4   TONSILLECTOMY     ULTRASOUND GUIDANCE FOR VASCULAR ACCESS  03/02/2018   Procedure: Ultrasound Guidance For Vascular Access;  Surgeon: Martinique, Peter M, MD;  Location: [place] [facility];  Service: Cardiovascular;;    Current Medications: Current Meds  Medication Sig   allopurinol (ZYLOPRIM) 100 MG tablet Take 100 mg by mouth daily.   amLODipine (NORVASC) 10 MG tablet Take 1 tablet (10 mg total) by mouth daily.   aspirin EC 81 MG tablet Take 1 tablet (81  mg total) by mouth daily.   clopidogrel (PLAVIX) 75 MG tablet TAKE ONE TABLET BY MOUTH ONCE DAILY WITH BREAKFAST Restart on 12/13/20 (Patient taking differently: Take 75 mg by mouth once. TAKE ONE TABLET BY MOUTH ONCE DAILY WITH BREAKFAST Restart on 12/13/20)   gabapentin (NEURONTIN) 300 MG capsule Take 300 mg by mouth 3 (three) times daily.   HYDROmorphone (DILAUDID) 2 MG tablet Take 1 tablet (2 mg total) by mouth every 4 (four) hours as needed (pain). (Patient taking differently: Take 2 mg by mouth every 4 (four) hours as needed for moderate pain or severe pain (pain).)   isosorbide mononitrate (IMDUR) 120 MG 24 hr tablet Take 1 tablet (120 mg total) by mouth daily.   lisinopril (PRINIVIL,ZESTRIL) 2.5 MG tablet Take 1 tablet (2.5 mg total) by mouth daily.   metoprolol tartrate (LOPRESSOR) 50 MG tablet TAKE 1 AND 1/2 TABLETS BY MOUTH TWICE DAILY (Patient taking differently: Take 75 mg by mouth 2 (two) times daily.)   nitroGLYCERIN (NITROSTAT) 0.4 MG SL tablet DISSOLVE 1 TABLET UNDER THE TONGUE EVERY 5 MINUTES AS NEEDED FOR CHEST PAIN. DO NOT  EXCEED A TOTAL OF 3 DOSES IN 15 MINUTES. (Patient taking differently: Place 0.4 mg under the tongue every 5 (five) minutes as needed.)   ondansetron (ZOFRAN) 4 MG tablet Take 1 tablet (4 mg total) by mouth daily as needed for nausea or vomiting.   ranolazine (RANEXA) 1000 MG SR tablet Take 1 tablet (1,000 mg total) by mouth 2 (two) times daily.   rosuvastatin (CRESTOR) 40 MG tablet TAKE ONE TABLET BY MOUTH ONCE DAILY (Patient taking differently: Take 40 mg by mouth daily.)   Sodium Sulfate-Mag Sulfate-KCl (SUTAB) [phone] MG TABS Take 1 tablet by mouth daily as needed (patient is unsure of why it states PRN but confirm he is on this med).   VASCEPA 1 g capsule Take 2 capsules (2 g total) by mouth 2 times daily at 12 noon and 4 pm. (Patient taking differently: Take 2 g by mouth 2 (two) times daily.)   Vitamin D, Ergocalciferol, (DRISDOL) 50000 units CAPS capsule  Take 50,000 Units by mouth every Sunday.      Allergies:   Penicillin g, Penicillins, Hydrocodone, Oxycodone, and Zolpidem   Social History   Socioeconomic History   Marital status: Married    Spouse name: Not on file   Number of children: Not on file   Years of education: Not on file   Highest education level: Not on file  Occupational History   Occupation: retired    Comment: Cabin crew  Tobacco Use   Smoking status: Former    Packs/day: 1.00    Years: 35.00    Pack years: 35.00    Types: Cigarettes    Quit date: 02/06/2001    Years since quitting: 19.9   Smokeless tobacco: Never  Vaping Use   Vaping Use: Never used  Substance and Sexual Activity   Alcohol use: Yes    Alcohol/week: 11.0 - 13.0 standard drinks    Types: 8 - 10 Cans of beer, 3 Shots of liquor per week    Comment: 12 pack on weekend   Drug use: Never   Sexual activity: Not Currently  Other Topics Concern   Not on file  Social History Narrative   Right handed   Lives at home in a one story home with wife    Retired    Cabin crew    Social Determinants of Radio broadcast assistant Strain: Not on file  Food Insecurity: Not on file  Transportation Needs: Not on file  Physical Activity: Not on file  Stress: Not on file  Social Connections: Not on file     Family History: The patient's family history includes Cancer in his father; Diabetes in his mother; Suicidality in his brother. ROS:   Please see the history of present illness.    All 14 point review of systems negative except as described per history of present illness  EKGs/Labs/Other Studies Reviewed:      Recent Labs: 11/29/2020: ALT 34; BUN 17; Creatinine, Ser 1.08; Hemoglobin 13.1; Platelets 211; Potassium 4.4; Sodium 136  Recent Lipid Panel    Component Value Date/Time   CHOL 116 07/25/2019 0813   TRIG 239 (H) 07/25/2019 0813   HDL 35 (L) 07/25/2019 0813   CHOLHDL 3.3 07/25/2019 0813   CHOLHDL 4.3 09/09/2017 0352    VLDL 75 (H) 09/09/2017 0352   LDLCALC 43 07/25/2019 0813   LDLDIRECT 45 07/25/2019 0813    Physical Exam:    VS:  BP (!) 88/58 (BP Location: Right Arm, Patient Position: Sitting)  Pulse 97   Ht 5\' 5"  (1.651 m)   Wt 187 lb 3.2 oz (84.9 kg)   SpO2 95%   BMI 31.15 kg/m     Wt Readings from Last 3 Encounters:  01/01/21 187 lb 3.2 oz (84.9 kg)  12/24/20 188 lb 3.2 oz (85.4 kg)  12/06/20 185 lb (83.9 kg)     GEN:  Well nourished, well developed in no acute distress HEENT: Normal NECK: No JVD; No carotid bruits LYMPHATICS: No lymphadenopathy CARDIAC: RRR, no murmurs, no rubs, no gallops RESPIRATORY:  Clear to auscultation without rales, wheezing or rhonchi  ABDOMEN: Soft, non-tender, non-distended MUSCULOSKELETAL:  No edema; No deformity  SKIN: Warm and dry LOWER EXTREMITIES: no swelling NEUROLOGIC:  Alert and oriented x 3 PSYCHIATRIC:  Normal affect   ASSESSMENT:    1. Coronary artery disease of native artery of native heart with stable angina pectoris ([place])   2. Essential hypertension   3. Dyslipidemia    PLAN:    In order of problems listed above:  Coronary artery disease stable from that point review and appropriate medication we decided to continue dual antiplatelet therapy because of high risk lesions. Essential hypertension his blood pressure actually is low today.  He complained of being weak tired and exhausted he says feeling asleep very easily.  I will ask him to stop his lisinopril. Dyslipidemia: I did review lab work test done by his primary care physician month ago his LDL was 64 and HDL 56 is an excellent cholesterol profile.  He is on Crestor 40 which is high intense statin which I will continue. Overall looks like he is doing well from cardiac standpoint to view we will continue present management.  I offered him sleep study again but he declined.   Medication Adjustments/Labs and Tests Ordered: Current medicines are reviewed at length with the patient  today.  Concerns regarding medicines are outlined above.  No orders of the defined types were placed in this encounter.  Medication changes: No orders of the defined types were placed in this encounter.   Signed, Park Liter, MD, [facility] 01/01/2021 [place]

## 2021-01-06 ENCOUNTER — Ambulatory Visit: Payer: PPO | Admitting: Neurology

## 2021-01-06 DIAGNOSIS — S0500XA Injury of conjunctiva and corneal abrasion without foreign body, unspecified eye, initial encounter: Secondary | ICD-10-CM | POA: Diagnosis not present

## 2021-02-14 ENCOUNTER — Other Ambulatory Visit: Payer: Self-pay | Admitting: Neurological Surgery

## 2021-02-14 DIAGNOSIS — M48062 Spinal stenosis, lumbar region with neurogenic claudication: Secondary | ICD-10-CM

## 2021-02-20 ENCOUNTER — Other Ambulatory Visit: Payer: Self-pay

## 2021-02-20 ENCOUNTER — Ambulatory Visit
Admission: RE | Admit: 2021-02-20 | Discharge: 2021-02-20 | Disposition: A | Discharge status: Home / Self Care | Payer: No Typology Code available for payment source | Source: Ambulatory Visit | Attending: Neurological Surgery | Admitting: Neurological Surgery

## 2021-02-20 DIAGNOSIS — M48062 Spinal stenosis, lumbar region with neurogenic claudication: Secondary | ICD-10-CM

## 2021-02-20 IMAGING — MR MR LUMBAR SPINE WO/W CM
4 of 7 series · 22 of 48 positions shown · IV contrast (multihance)
Comparison: [DATE]

CLINICAL DATA: Spinal stenosis, lumbar region, with neurogenic
claudication

EXAM:
MRI LUMBAR SPINE WITHOUT AND WITH CONTRAST
TECHNIQUE: Multiplanar and multiecho pulse sequences of the lumbar spine were
obtained without and with intravenous contrast.
CONTRAST:  17mL MULTIHANCE GADOBENATE DIMEGLUMINE 529 MG/ML IV SOLN

[Series 3: T1 · sagittal · 4.0mm · 0.88mm/px · 3 of 14 slices shown (1 of 2)]
[im 1/14]
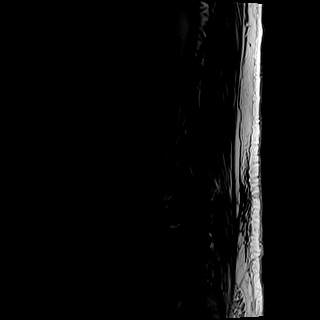
[im 7/14]
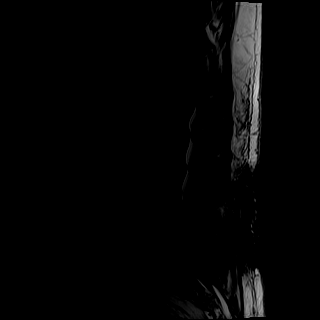
[im 14/14]
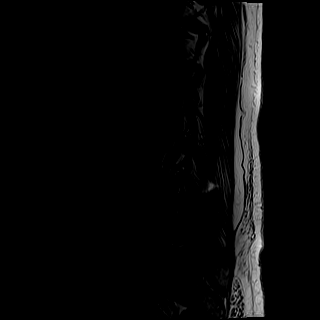

[Series 5: T2 · axial · 4.0mm · 0.39mm/px · z∈[-57,+132]mm · 11 of 39 slices shown (1 of 2)]
[im 1/39]
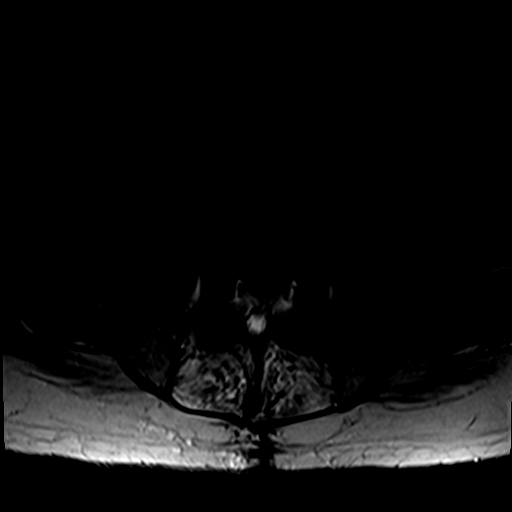
[im 4/39]
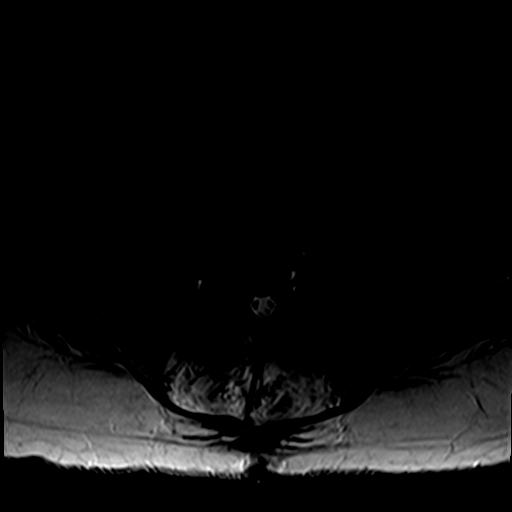
[im 8/39]
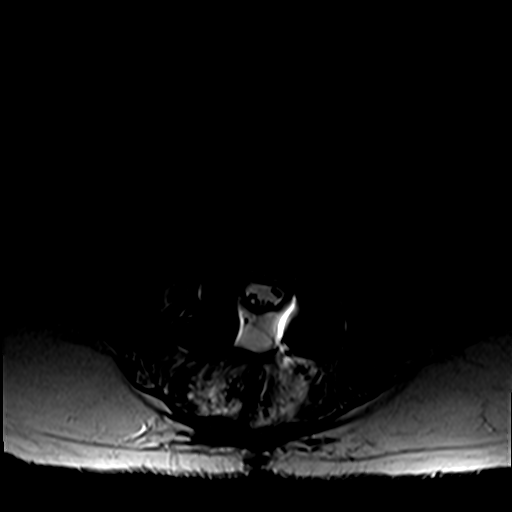
[im 12/39]
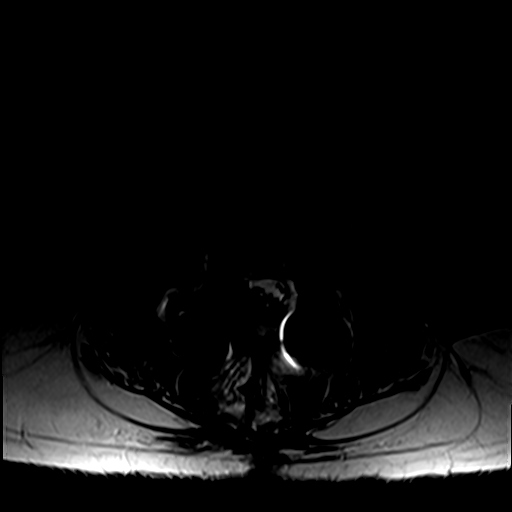
[im 16/39]
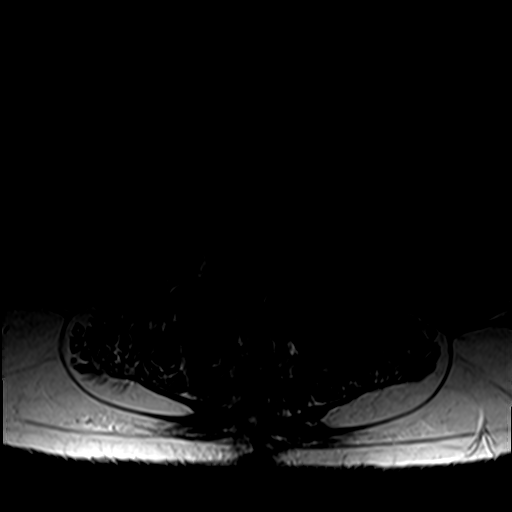
[im 20/39]
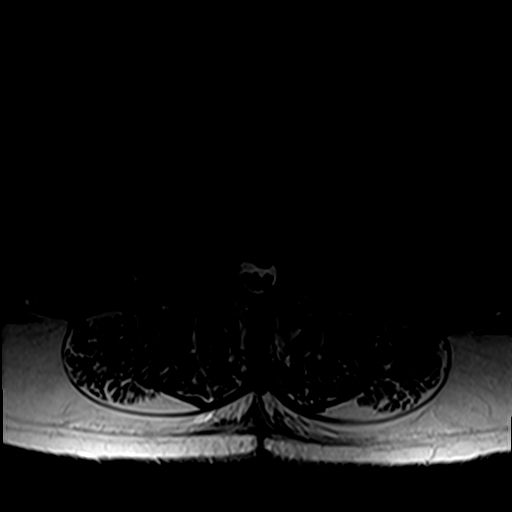
[im 23/39]
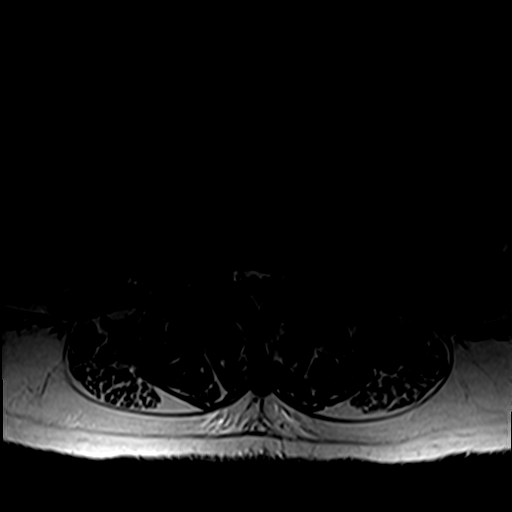
[im 27/39]
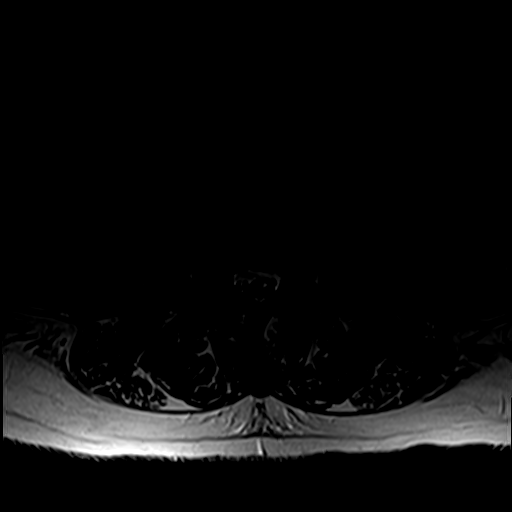
[im 31/39]
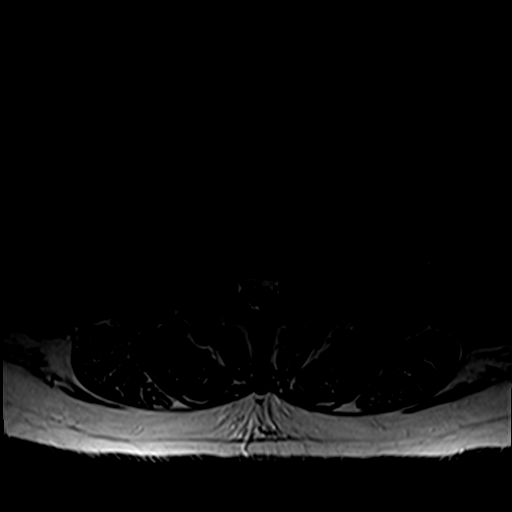
[im 35/39]
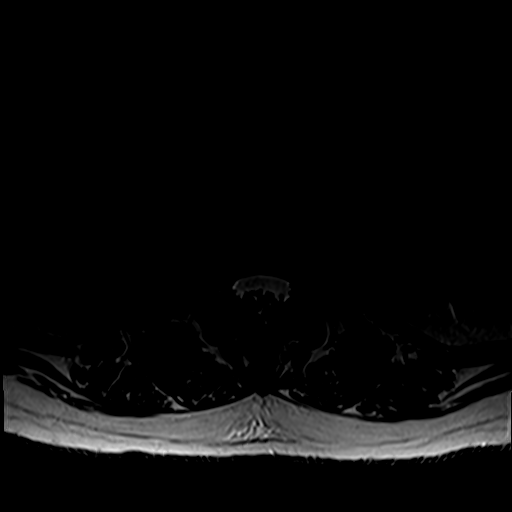
[im 39/39]
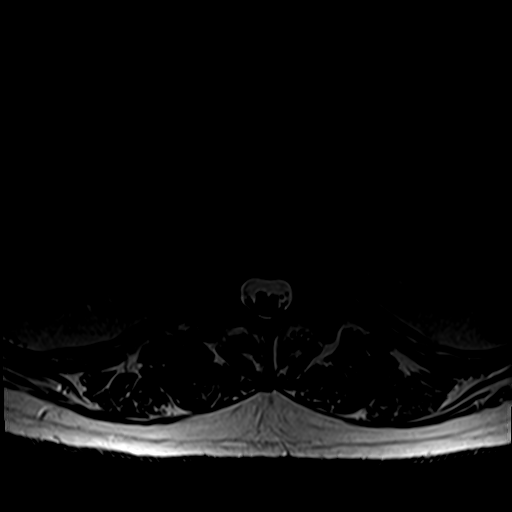

[Series 6: T1 · axial · 4.0mm · 0.39mm/px · z∈[-57,+113]mm · 4 of 39 slices shown (2 of 2)]
[im 1/39]
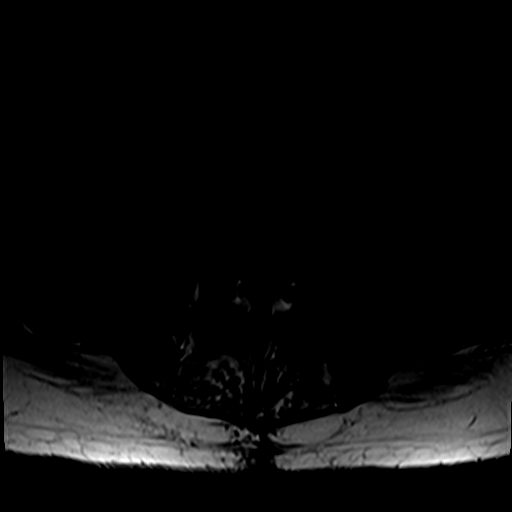
[im 4/39]
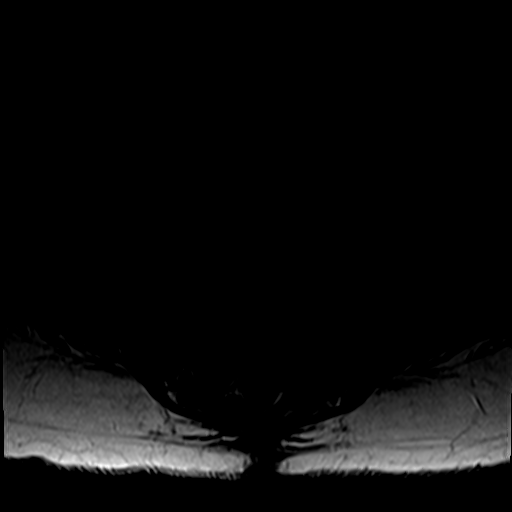
[im 20/39]
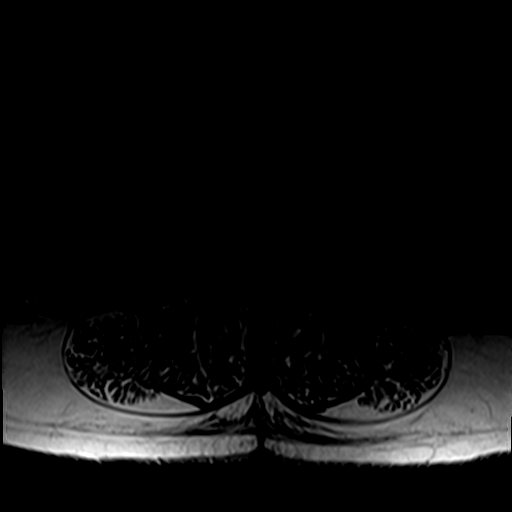
[im 35/39]
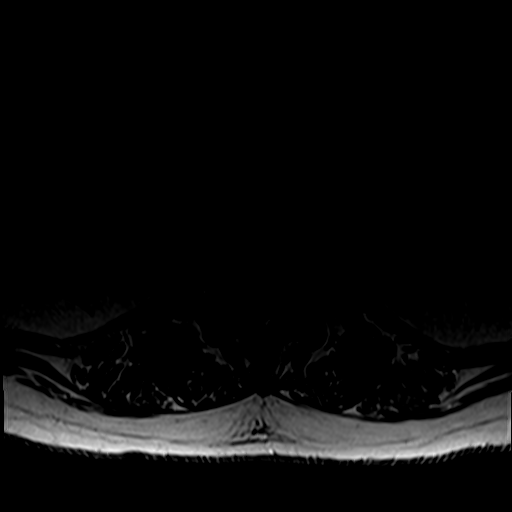

[Series 7: T2 · sagittal · 4.0mm · 1.09mm/px · 4 of 14 slices shown (2 of 2)]
[im 1/14]
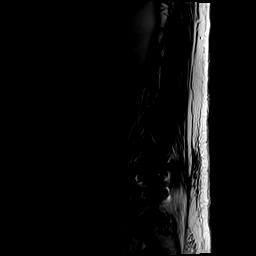
[im 5/14]
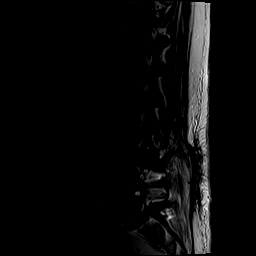
[im 9/14]
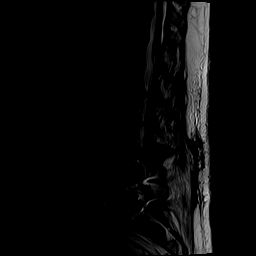
[im 14/14]
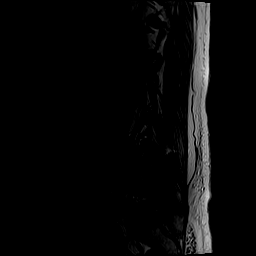

[22 of 48 positions shown; findings below may reference images not displayed]

FINDINGS: Segmentation:  Standard.

Alignment:  Stable.

Vertebrae: Interval postoperative changes at L4-S1 with rods and
pedicle screws, laminectomies, and interbody fusion devices.
Hardware is not well evaluated and there is associated
susceptibility artifact. There is mild endplate marrow edema at
L4-L5. Small vertebral body hemangioma at L4.

Conus medullaris and cauda equina: Conus extends to the L1 level.
Conus and cauda equina appear normal. No abnormal intrathecal
enhancement.

Paraspinal and other soft tissues: Postoperative changes including
small fluid collection within the laminectomy bed, which does not
exert any significant mass effect on the thecal sac.

Disc levels: Congenital narrowing of the spinal canal.

L1-L2: Disc bulge. Mild prominence of dorsal epidural fat. Mild
facet arthropathy. Mild canal stenosis with slight effacement of the
left subarticular recess. Minor right and mild left foraminal
stenosis. Appearance is similar.

L2-L3: Disc bulge. Mild prominence of dorsal epidural fat. Mild
facet arthropathy. Mild canal stenosis. Slight effacement of left
subarticular recess. No foraminal stenosis. Appearance is similar.

L3-L4: Disc bulge. Mild prominence of dorsal epidural fat. Mild
facet arthropathy. Increased marked canal stenosis. Effacement of
left greater than right subarticular recesses. No right foraminal
stenosis. Mild left foraminal stenosis.

L4-L5: Operative level. Endplate osteophytes. Postoperative
enhancing granulation or fibrotic tissue in the epidural space.
Canal and subarticular recesses appear decompressed. Foramina are
distorted by artifact and not well evaluated. Does not appear to be
progressive stenosis.

L5-S1: Operative level. Endplate osteophytes. Postoperative
enhancing granulation or fibrotic tissue in the epidural space.
Canal and subarticular recesses appear decompressed. Foramina are
distorted by artifact without apparent significant stenosis.
IMPRESSION: Postoperative and degenerative changes as detailed above.
Progression at L3-L4 with marked canal stenosis and effacement of
left greater than right subarticular recesses. Decompressed canal at
operative levels.

## 2021-02-20 MED ORDER — GADOBENATE DIMEGLUMINE 529 MG/ML IV SOLN
17.0000 mL | Freq: Once | INTRAVENOUS | Status: AC | PRN
  Administered 2021-02-20: 17 mL via INTRAVENOUS

## 2021-03-14 DIAGNOSIS — H2513 Age-related nuclear cataract, bilateral: Secondary | ICD-10-CM | POA: Diagnosis not present

## 2021-04-29 ENCOUNTER — Telehealth: Payer: Self-pay | Admitting: Cardiology

## 2021-04-29 NOTE — Telephone Encounter (Signed)
Pt c/o Shortness Of Breath: STAT if SOB developed within the last 24 hours or pt is noticeably SOB on the phone  1. Are you currently SOB (can you hear that pt is SOB on the phone)? No   2. How long have you been experiencing SOB? Started a little bit before christmas   3. Are you SOB when sitting or when up moving around? Moving around   4. Are you currently experiencing any other symptoms? Heart racing, feels as if when SOB occurs    Has been unable to check HR once he stops exerting himself symptoms subside.

## 2021-04-29 NOTE — Telephone Encounter (Signed)
Attempted to call patient. Patient does not have an answering machine unable to leave a message to call back.

## 2021-04-30 NOTE — Telephone Encounter (Signed)
Tried calling patient. No answer and no voicemail set up for me to leave a message. 

## 2021-05-01 NOTE — Telephone Encounter (Signed)
Tried calling patient. No answer and no voicemail set up for me to leave a message.  Letter mailed to patient at this time.

## 2021-05-13 ENCOUNTER — Telehealth: Payer: Self-pay

## 2021-05-13 NOTE — Telephone Encounter (Signed)
° °  [facility] Pre-operative Risk Assessment    Request for surgical clearance:  What type of surgery is being performed? L3-4 Translaminar Epidural Steroid Injection   When is this surgery scheduled? TBD   What type of clearance is required (medical clearance vs. Pharmacy clearance to hold med vs. Both)? Pharmacy   Are there any medications that need to be held prior to surgery and how long?Aspirin and Plavix, holding length not specified    Practice name and name of physician performing surgery? Dr. Kristeen Miss at [facility]   What is your office phone number: [phone]    7.   What is your office fax number: [phone]  8.   Anesthesia type (None, local, MAC, general) ? IV Sedation   Robert Ramos 05/13/2021, 11:58 AM  _________________________________________________________________   (provider comments below)

## 2021-05-13 NOTE — Telephone Encounter (Signed)
Attempted to contact patient as part of preoperative protocol.  Unable to leave voice message.  05/13/2021 at 1301.  Patient needs call back

## 2021-05-15 NOTE — Telephone Encounter (Signed)
Unable to reach the patient, cannot leave a voicemail either.

## 2021-05-16 NOTE — Telephone Encounter (Addendum)
Called patient and unable to LVM  Of note, patient had back surgery last year during the summer 2022 and Dr. Agustin Cree said it was OK to hold Plavix 5-7 days before procedure.

## 2021-05-21 NOTE — Telephone Encounter (Signed)
° °  Name: Robert Ramos  DOB: December 10, 1950  MRN: [id number]  Primary Cardiologist: Jenne Campus, MD  Chart reviewed as part of pre-operative protocol coverage. Because of Jonavan Vanhorn Bomberger's past medical history and time since last visit, he will require a follow-up visit in order to better assess preoperative cardiovascular risk.  Per chart review, patient called into the office 04/29/21 reporting worsening SOB with exertion as well as heart racing. Needs OV for further evaluation before stopping blood thinners. Appears there are issues trying to reach the patient.   Pre-op covering staff: - Please schedule appointment and call patient to inform them. - Please contact requesting surgeon's office via preferred method (i.e, phone, fax) to inform them of need for appointment prior to surgery.  Re: antiplatelets - can reference clearance 09/2020 for input on ASA / Plavix at time of OV if appropriate.  Charlie Pitter, PA-C  05/21/2021, 4:33 PM

## 2021-05-22 ENCOUNTER — Telehealth: Payer: Self-pay

## 2021-05-22 NOTE — Telephone Encounter (Signed)
Pt has appt 06/27/21 with Dr. Agustin Cree. I will send a message to [facility] scheduling to see if they may be able to get pt in sooner for pre op clearance. I will send FYI to requesting office.

## 2021-05-22 NOTE — Telephone Encounter (Signed)
Spoke with patient, stated the injetion procedure was done yesterday with out being cleared and now the office in which procure was done is requesting clearance. I explained to the patient our protocol  and he verbalized understanding. I advised to have the office fax a clearance request and explanation of why procedure was done with out clearance being approved. Patient did say he has not been on Plavix in a while.

## 2021-05-22 NOTE — Telephone Encounter (Signed)
-----   Message from Lowella Grip, [place] sent at 05/22/2021 11:30 AM EST ----- Regarding: FW: PRE OP APPT SOONER?  ----- Message ----- From: Michae Kava, CMA Sent: 05/22/2021  10:58 AM EST To: Cv Div [facility]/Hp Scheduling Subject: PRE OP APPT SOONER?                            Good morning everyone,   I hope all is well on your end there today. This pt has an appt already 06/27/21 with Dr. Agustin Cree. Pt needs pre op clearance and I was wondering if someone could see if you might be able to get the pt in a bit sooner for pre op clearance. I appreciate any help you can give.  Thank you Arbie Cookey

## 2021-06-26 ENCOUNTER — Other Ambulatory Visit: Payer: Self-pay | Admitting: Neurological Surgery

## 2021-06-27 ENCOUNTER — Other Ambulatory Visit: Payer: Self-pay

## 2021-06-27 ENCOUNTER — Encounter: Payer: Self-pay | Admitting: Cardiology

## 2021-06-27 ENCOUNTER — Ambulatory Visit: Payer: PPO | Admitting: Cardiology

## 2021-06-27 VITALS — BP 126/84 | HR 96 | Ht 65.0 in | Wt 191.8 lb

## 2021-06-27 DIAGNOSIS — E785 Hyperlipidemia, unspecified: Secondary | ICD-10-CM | POA: Diagnosis not present

## 2021-06-27 DIAGNOSIS — E782 Mixed hyperlipidemia: Secondary | ICD-10-CM | POA: Diagnosis not present

## 2021-06-27 DIAGNOSIS — I10 Essential (primary) hypertension: Secondary | ICD-10-CM | POA: Diagnosis not present

## 2021-06-27 DIAGNOSIS — I25118 Atherosclerotic heart disease of native coronary artery with other forms of angina pectoris: Secondary | ICD-10-CM

## 2021-06-27 MED ORDER — ISOSORBIDE MONONITRATE ER 120 MG PO TB24: ORAL_TABLET | ORAL | 2 refills | Status: DC

## 2021-06-27 NOTE — Patient Instructions (Signed)
Medication Instructions:  ?Your physician has recommended you make the following change in your medication:  ? ?START: Imdur 180 mg daily ? ?*If you need a refill on your cardiac medications before your next appointment, please call your pharmacy* ? ? ?Lab Work: ?None ?If you have labs (blood work) drawn today and your tests are completely normal, you will receive your results only by: ?MyChart Message (if you have MyChart) OR ?A paper copy in the mail ?If you have any lab test that is abnormal or we need to change your treatment, we will call you to review the results. ? ? ?Testing/Procedures: ?Your physician has requested that you have an echocardiogram. Echocardiography is a painless test that uses sound waves to create images of your heart. It provides your doctor with information about the size and shape of your heart and how well your heart?s chambers and valves are working. This procedure takes approximately one hour. There are no restrictions for this procedure. ? ? ? ?Follow-Up: ?At [facility], you and your health needs are our priority.  As part of our continuing mission to provide you with exceptional heart care, we have created designated Provider Care Teams.  These Care Teams include your primary Cardiologist (physician) and Advanced Practice Providers (APPs -  Physician Assistants and Nurse Practitioners) who all work together to provide you with the care you need, when you need it. ? ?We recommend signing up for the patient portal called "MyChart".  Sign up information is provided on this After Visit Summary.  MyChart is used to connect with patients for Virtual Visits (Telemedicine).  Patients are able to view lab/test results, encounter notes, upcoming appointments, etc.  Non-urgent messages can be sent to your provider as well.   ?To learn more about what you can do with MyChart, go to [email or web address].   ? ?Your next appointment:   ?1 month(s) ? ?The format for your next appointment:    ?In Person ? ?Provider:   ?Jenne Campus, MD  ? ? ?Other Instructions ?None ? ?

## 2021-06-27 NOTE — Progress Notes (Signed)
?Cardiology Office Note:   ? ?Date:  06/27/2021  ? ?ID:  Robert Ramos, DOB Nov 06, 1950, MRN [id number] ? ?PCP:  [name], MD  ?Cardiologist:  Jenne Campus, MD   ? ?Referring MD: [name], MD  ? ?Chief Complaint  ?Patient presents with  ? Tachycardia  ? Shortness of Breath  ?  Ongoing since 03/2021  ? ? ?History of Present Illness:   ? ?Robert Ramos is a 71 y.o. male    with coronary artery disease status post PCI of the LAD in 2018, status post PCI of the posterior lateral branch of the RCA in May 2019, hypertension, hyperlipidemia.  He has had continued symptoms of exertional chest discomfort and shortness of breath at low level activity despite treatment with multidrug antianginal therapy.  Recent stress testing was abnormal.  He was referred to Dr. Burt Knack for further evaluation.  Cardiac catheterization demonstrated moderate LAD stenosis unchanged from previous study, patent LCx with small vessel disease involving the OM branches not amenable to PCI and interval occlusion of the mid RCA with left-to-right collaterals.  Patient was referred to Dr. Martinique for possible CTO PCI of the RCA.  He underwent attempted CTO PCI of the RCA but this was unsuccessful.  Continued medical therapy was recommended. ?He comes today to my office for follow-up he is not doing well.  He said that effort will give him significant shortness of breath as well as palpitations he will be aware of his heart speeding up.  Also developed chest tightness.  This been going on since December.  He gained some weight since that time. ? ?Past Medical History:  ?Diagnosis Date  ? Anemia 09/28/2019  ? Arthritis   ? "hands, back" (03/02/2018)  ? ASHD (arteriosclerotic heart disease) 08/27/2015  ? Body mass index (BMI) 34.0-34.9, adult 03/15/2019  ? Chronic bilateral low back pain with bilateral sciatica 01/22/2020  ? Chronic pain of left knee 09/28/2019  ? Chronic pain syndrome 01/22/2020  ? Chronic radicular low back pain    ? Coronary artery disease   ? a. MI in 2002 s/p stenting to [place] b.cath 11/2016 s/p DES to mLAD c. Cath 08/2017- patent LAD stent, 40% instent restenosis of mRCA, 99% posterolateral artery s/p PTCA & DES.  ? Disc displacement, lumbar 05/10/2019  ? Dyslipidemia 11/17/2016  ? Essential (primary) hypertension 03/15/2019  ? Essential hypertension 08/27/2015  ? Gout   ? "on RX qod" (03/02/2018)  ? Heart attack [facility] 2002  ? History of bacterial pneumonia 07/22/2020  ? History of kidney stones   ? Hyperlipidemia   ? Hypertension   ? Hypogonadism in male 08/27/2015  ? Kidney stone 08/27/2015  ? Long-term use of high-risk medication 08/27/2015  ? Lumbago with sciatica, right side 01/22/2020  ? Lumbar post-laminectomy syndrome 06/08/2019  ? Malaise and fatigue 08/27/2015  ? Mixed hyperlipidemia 08/27/2015  ? Non-seasonal allergic rhinitis 07/04/2018  ? Obstructive sleep apnea 11/17/2016  ? Pneumonia   ? Pre-diabetes   ? Precordial chest pain 11/24/2017  ? Prediabetes 08/27/2015  ? Shortness of breath 11/17/2016  ? 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 Stage 3 chronic kidney disease ([place]) 08/27/2015  ? Tremor 05/30/2020  ? ? ?Past Surgical History:  ?Procedure Laterality Date  ? APPENDECTOMY  1974  ? BACK SURGERY    ? CORONARY ANGIOPLASTY WITH STENT PLACEMENT  2002; 11/20/2016  ? "@ [facility]; @ [facility]"  ? CORONARY CTO INTERVENTION N/A 03/02/2018  ? Procedure:  CORONARY CTO INTERVENTION - Right;  Surgeon: Martinique, Peter M, MD;  Location: [place] [facility];  Service: Cardiovascular;  Laterality: N/A;  ? CORONARY STENT INTERVENTION N/A 11/20/2016  ? Procedure: CORONARY STENT INTERVENTION;  Surgeon: Nelva Bush, MD;  Location: [place] [facility];  Service: Cardiovascular;  Laterality: N/A;  ? CORONARY STENT INTERVENTION N/A 09/09/2017  ? Procedure: CORONARY STENT INTERVENTION;  Surgeon: Burnell Blanks, MD;  Location: [place] [facility];  Service: Cardiovascular;  Laterality:  N/A;  ? HEMI-MICRODISCECTOMY LUMBAR LAMINECTOMY LEVEL 1 Left 2008  ? L4  ? INTRAVASCULAR PRESSURE WIRE/FFR STUDY N/A 09/16/2017  ? Procedure: INTRAVASCULAR PRESSURE WIRE/FFR STUDY;  Surgeon: Belva Crome, MD;  Location: [place] [facility];  Service: Cardiovascular;  Laterality: N/A;  ? INTRAVASCULAR ULTRASOUND/IVUS N/A 11/20/2016  ? Procedure: Intravascular Ultrasound/IVUS;  Surgeon: Nelva Bush, MD;  Location: [place] [facility];  Service: Cardiovascular;  Laterality: N/A;  ? KNEE ARTHROSCOPY Left 1983  ? LEFT HEART CATH AND CORONARY ANGIOGRAPHY N/A 11/20/2016  ? Procedure: LEFT HEART CATH AND CORONARY ANGIOGRAPHY;  Surgeon: Nelva Bush, MD;  Location: [place] [facility];  Service: Cardiovascular;  Laterality: N/A;  ? LEFT HEART CATH AND CORONARY ANGIOGRAPHY N/A 09/09/2017  ? Procedure: LEFT HEART CATH AND CORONARY ANGIOGRAPHY;  Surgeon: Burnell Blanks, MD;  Location: [place] [facility];  Service: Cardiovascular;  Laterality: N/A;  ? LEFT HEART CATH AND CORONARY ANGIOGRAPHY N/A 09/16/2017  ? Procedure: LEFT HEART CATH AND CORONARY ANGIOGRAPHY;  Surgeon: Belva Crome, MD;  Location: [place] [facility];  Service: Cardiovascular;  Laterality: N/A;  ? LEFT HEART CATH AND CORONARY ANGIOGRAPHY N/A 02/21/2018  ? Procedure: LEFT HEART CATH AND CORONARY ANGIOGRAPHY;  Surgeon: Sherren Mocha, MD;  Location: [place] [facility];  Service: Cardiovascular;  Laterality: N/A;  ? LUMBAR FUSION  12/11/2020  ? L1-2-3-4  ? TONSILLECTOMY    ? ULTRASOUND GUIDANCE FOR VASCULAR ACCESS  03/02/2018  ? Procedure: Ultrasound Guidance For Vascular Access;  Surgeon: Martinique, Peter M, MD;  Location: [place] [facility];  Service: Cardiovascular;;  ? ? ?Current Medications: ?Current Meds  ?Medication Sig  ? allopurinol (ZYLOPRIM) 100 MG tablet Take 100 mg by mouth daily.  ? amLODipine (NORVASC) 10 MG tablet Take 1 tablet (10 mg total) by mouth daily.  ? aspirin EC 81 MG tablet Take 1 tablet (81 mg total) by mouth daily.  ?  gabapentin (NEURONTIN) 300 MG capsule Take 300 mg by mouth 3 (three) times daily.  ? HYDROmorphone (DILAUDID) 2 MG tablet Take 1 tablet (2 mg total) by mouth every 4 (four) hours as needed (pain). (Patient taking differently: Take 2 mg by mouth every 4 (four) hours as needed for moderate pain or severe pain (pain).)  ? isosorbide mononitrate (IMDUR) 120 MG 24 hr tablet Take 1 tablet (120 mg total) by mouth daily.  ? metoprolol tartrate (LOPRESSOR) 50 MG tablet TAKE 1 AND 1/2 TABLETS BY MOUTH TWICE DAILY (Patient taking differently: Take 75 mg by mouth 2 (two) times daily.)  ? nitroGLYCERIN (NITROSTAT) 0.4 MG SL tablet DISSOLVE 1 TABLET UNDER THE TONGUE EVERY 5 MINUTES AS NEEDED FOR CHEST PAIN. DO NOT EXCEED A TOTAL OF 3 DOSES IN 15 MINUTES. (Patient taking differently: Place 0.4 mg under the tongue every 5 (five) minutes as needed.)  ? ondansetron (ZOFRAN) 4 MG tablet Take 1 tablet (4 mg total) by mouth daily as needed for nausea or vomiting.  ? ranolazine (RANEXA) 1000 MG SR tablet Take 1 tablet (  1,000 mg total) by mouth 2 (two) times daily.  ? rosuvastatin (CRESTOR) 40 MG tablet TAKE ONE TABLET BY MOUTH ONCE DAILY (Patient taking differently: Take 40 mg by mouth daily.)  ? Sodium Sulfate-Mag Sulfate-KCl (SUTAB) [phone] MG TABS Take 1 tablet by mouth daily as needed (patient is unsure of why it states PRN but confirm he is on this med).  ? VASCEPA 1 g capsule Take 2 capsules (2 g total) by mouth 2 times daily at 12 noon and 4 pm. (Patient taking differently: Take 2 g by mouth 2 (two) times daily.)  ? Vitamin D, Ergocalciferol, (DRISDOL) 50000 units CAPS capsule Take 50,000 Units by mouth every Sunday.   ?  ? ?Allergies:   Penicillin g, Penicillins, Hydrocodone, Oxycodone, and Zolpidem  ? ?Social History  ? ?Socioeconomic History  ? Marital status: Married  ?  Spouse name: Not on file  ? Number of children: Not on file  ? Years of education: Not on file  ? Highest education level: Not on file  ?Occupational  History  ? Occupation: retired  ?  Comment: Cabin crew  ?Tobacco Use  ? Smoking status: Former  ?  Packs/day: 1.00  ?  Years: 35.00  ?  Pack years: 35.00  ?  Types: Cigarettes  ?  Quit date: 02/06/2001

## 2021-06-30 ENCOUNTER — Ambulatory Visit (INDEPENDENT_AMBULATORY_CARE_PROVIDER_SITE_OTHER): Payer: PPO

## 2021-06-30 ENCOUNTER — Other Ambulatory Visit: Payer: Self-pay

## 2021-06-30 DIAGNOSIS — E782 Mixed hyperlipidemia: Secondary | ICD-10-CM | POA: Diagnosis not present

## 2021-06-30 DIAGNOSIS — E785 Hyperlipidemia, unspecified: Secondary | ICD-10-CM

## 2021-06-30 DIAGNOSIS — I25118 Atherosclerotic heart disease of native coronary artery with other forms of angina pectoris: Secondary | ICD-10-CM

## 2021-06-30 DIAGNOSIS — I10 Essential (primary) hypertension: Secondary | ICD-10-CM | POA: Diagnosis not present

## 2021-06-30 LAB — ECHOCARDIOGRAM COMPLETE
Area-P 1/2: 3.95 cm2
P 1/2 time: 668 msec
S' Lateral: 3 cm

## 2021-07-04 ENCOUNTER — Telehealth: Payer: Self-pay | Admitting: Cardiology

## 2021-07-04 NOTE — Telephone Encounter (Signed)
Attempted to call patient. Patient did not answer the phone and there was no answering machine to leave a voice mail. ?

## 2021-07-04 NOTE — Telephone Encounter (Signed)
Patient is calling for his echo results 

## 2021-07-07 NOTE — Telephone Encounter (Signed)
Attempted to call patient. Patient did not answer the phone and there was no answering machine to leave a voice mail. ?

## 2021-07-08 NOTE — Telephone Encounter (Signed)
Attempted to call patient. Patient did not answer the phone and there was no answering machine to leave a voice mail. ?

## 2021-07-10 ENCOUNTER — Telehealth: Payer: Self-pay

## 2021-07-10 NOTE — Telephone Encounter (Signed)
Unable to reach the patient, as a last attempt, a letter requesting a call back has been mailed.  ?

## 2021-07-10 NOTE — Telephone Encounter (Signed)
-----   Message from Park Liter, MD sent at 07/02/2021  7:40 PM EDT ----- ?Echocardiogram showed preserved left ventricle ejection fraction mild concentric LVH, mild mitral valve regurgitation.  Overall looks good ?

## 2021-07-10 NOTE — Telephone Encounter (Signed)
Unable to get in touch with the patient to review test with him. Mailed a letter to his house to inform him of his test. ?

## 2021-07-17 ENCOUNTER — Ambulatory Visit
Admission: RE | Admit: 2021-07-17 | Discharge: 2021-07-17 | Disposition: A | Discharge status: Home / Self Care | Payer: Worker's Compensation | Source: Ambulatory Visit | Attending: Neurological Surgery | Admitting: Neurological Surgery

## 2021-07-17 DIAGNOSIS — M5416 Radiculopathy, lumbar region: Secondary | ICD-10-CM

## 2021-07-17 MED ORDER — GADOBENATE DIMEGLUMINE 529 MG/ML IV SOLN
18.0000 mL | Freq: Once | INTRAVENOUS | Status: AC | PRN
  Administered 2021-07-17: 18 mL via INTRAVENOUS

## 2021-07-28 ENCOUNTER — Ambulatory Visit: Payer: PPO | Admitting: Cardiology

## 2021-07-28 ENCOUNTER — Other Ambulatory Visit: Payer: Self-pay | Admitting: Cardiology

## 2021-07-28 ENCOUNTER — Encounter: Payer: Self-pay | Admitting: Cardiology

## 2021-07-28 VITALS — BP 120/74 | HR 66 | Ht 65.0 in | Wt 194.8 lb

## 2021-07-28 DIAGNOSIS — R7303 Prediabetes: Secondary | ICD-10-CM | POA: Diagnosis not present

## 2021-07-28 DIAGNOSIS — I10 Essential (primary) hypertension: Secondary | ICD-10-CM | POA: Diagnosis not present

## 2021-07-28 DIAGNOSIS — E785 Hyperlipidemia, unspecified: Secondary | ICD-10-CM

## 2021-07-28 DIAGNOSIS — I25118 Atherosclerotic heart disease of native coronary artery with other forms of angina pectoris: Secondary | ICD-10-CM

## 2021-07-28 DIAGNOSIS — R0602 Shortness of breath: Secondary | ICD-10-CM

## 2021-07-28 DIAGNOSIS — R079 Chest pain, unspecified: Secondary | ICD-10-CM

## 2021-07-28 NOTE — Patient Instructions (Signed)
Medication Instructions:  Your physician recommends that you continue on your current medications as directed. Please refer to the Current Medication list given to you today.  *If you need a refill on your cardiac medications before your next appointment, please call your pharmacy*   Lab Work: None Ordered If you have labs (blood work) drawn today and your tests are completely normal, you will receive your results only by: MyChart Message (if you have MyChart) OR A paper copy in the mail If you have any lab test that is abnormal or we need to change your treatment, we will call you to review the results.   Testing/Procedures: Your physician has requested that you have a carotid duplex. This test is an ultrasound of the carotid arteries in your neck. It looks at blood flow through these arteries that supply the brain with blood. Allow one hour for this exam. There are no restrictions or special instructions.    Follow-Up: At [facility], you and your health needs are our priority.  As part of our continuing mission to provide you with exceptional heart care, we have created designated Provider Care Teams.  These Care Teams include your primary Cardiologist (physician) and Advanced Practice Providers (APPs -  Physician Assistants and Nurse Practitioners) who all work together to provide you with the care you need, when you need it.  We recommend signing up for the patient portal called "MyChart".  Sign up information is provided on this After Visit Summary.  MyChart is used to connect with patients for Virtual Visits (Telemedicine).  Patients are able to view lab/test results, encounter notes, upcoming appointments, etc.  Non-urgent messages can be sent to your provider as well.   To learn more about what you can do with MyChart, go to [email or web address].    Your next appointment:   5 month(s)  The format for your next appointment:   In Person  Provider:   Robert Krasowski, MD     Other Instructions NA  

## 2021-07-28 NOTE — Addendum Note (Signed)
Addended by: Jacobo Forest D on: 07/28/2021 10:48 AM ? ? Modules accepted: Orders ? ?

## 2021-07-28 NOTE — Progress Notes (Signed)
?Cardiology Office Note:   ? ?Date:  07/28/2021  ? ?ID:  Robert Ramos, DOB [birth year], MRN [id number] ? ?PCP:  [name], MD  ?Cardiologist:  Jenne Campus, MD   ? ?Referring MD: [name], MD  ? ?Chief Complaint  ?Patient presents with  ? Follow-up  ?I am doing better ? ?History of Present Illness:   ? ?Robert Ramos is a 71 y.o. male   with coronary artery disease status post PCI of the LAD in 2018, status post PCI of the posterior lateral branch of the RCA in May 2019, hypertension, hyperlipidemia.  He has had continued symptoms of exertional chest discomfort and shortness of breath at low level activity despite treatment with multidrug antianginal therapy.  Recent stress testing was abnormal.  He was referred to Dr. Burt Knack for further evaluation.  Cardiac catheterization demonstrated moderate LAD stenosis unchanged from previous study, patent LCx with small vessel disease involving the OM branches not amenable to PCI and interval occlusion of the mid RCA with left-to-right collaterals.  Patient was referred to Dr. Martinique for possible CTO PCI of the RCA.  He underwent attempted CTO PCI of the RCA but this was unsuccessful.  Continued medical therapy was recommended. ?He comes today to my office for follow-up.  Overall he said he is doing better.  Chest pain significantly improved he is able to walk in the garage so of the floor and having no difficulty doing it.  Overall he is happy the way he feels now ? ?Past Medical History:  ?Diagnosis Date  ? Anemia 09/28/2019  ? Arthritis   ? "hands, back" (03/02/2018)  ? ASHD (arteriosclerotic heart disease) 08/27/2015  ? Body mass index (BMI) 34.0-34.9, adult 03/15/2019  ? Chronic bilateral low back pain with bilateral sciatica 01/22/2020  ? Chronic pain of left knee 09/28/2019  ? Chronic pain syndrome 01/22/2020  ? Chronic radicular low back pain   ? Coronary artery disease   ? a. MI in 2002 s/p stenting to [place] b.cath 11/2016 s/p DES to mLAD c.  Cath 08/2017- patent LAD stent, 40% instent restenosis of mRCA, 99% posterolateral artery s/p PTCA & DES.  ? Disc displacement, lumbar 05/10/2019  ? Dyslipidemia 11/17/2016  ? Essential (primary) hypertension 03/15/2019  ? Essential hypertension 08/27/2015  ? Gout   ? "on RX qod" (03/02/2018)  ? Heart attack [facility]) 2002  ? History of bacterial pneumonia 07/22/2020  ? History of kidney stones   ? Hyperlipidemia   ? Hypertension   ? Hypogonadism in male 08/27/2015  ? Kidney stone 08/27/2015  ? Long-term use of high-risk medication 08/27/2015  ? Lumbago with sciatica, right side 01/22/2020  ? Lumbar post-laminectomy syndrome 06/08/2019  ? Malaise and fatigue 08/27/2015  ? Mixed hyperlipidemia 08/27/2015  ? Non-seasonal allergic rhinitis 07/04/2018  ? Obstructive sleep apnea 11/17/2016  ? Pneumonia   ? Pre-diabetes   ? Precordial chest pain 11/24/2017  ? Prediabetes 08/27/2015  ? Shortness of breath 11/17/2016  ? 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 Stage 3 chronic kidney disease ([place]) 08/27/2015  ? Tremor 05/30/2020  ? ? ?Past Surgical History:  ?Procedure Laterality Date  ? APPENDECTOMY  1974  ? BACK SURGERY    ? CORONARY ANGIOPLASTY WITH STENT PLACEMENT  2002; 11/20/2016  ? "@ [facility]; @ [facility]"  ? CORONARY CTO INTERVENTION N/A 03/02/2018  ? Procedure: CORONARY CTO INTERVENTION - Right;  Surgeon: Martinique, Peter M, MD;  Location: [place] [facility];  Service: Cardiovascular;  Laterality: N/A;  ? CORONARY STENT INTERVENTION N/A 11/20/2016  ? Procedure: CORONARY STENT INTERVENTION;  Surgeon: Nelva Bush, MD;  Location: [place] [facility];  Service: Cardiovascular;  Laterality: N/A;  ? CORONARY STENT INTERVENTION N/A 09/09/2017  ? Procedure: CORONARY STENT INTERVENTION;  Surgeon: Burnell Blanks, MD;  Location: [place] [facility];  Service: Cardiovascular;  Laterality: N/A;  ? HEMI-MICRODISCECTOMY LUMBAR LAMINECTOMY LEVEL 1 Left 2008  ? L4  ? INTRAVASCULAR PRESSURE  WIRE/FFR STUDY N/A 09/16/2017  ? Procedure: INTRAVASCULAR PRESSURE WIRE/FFR STUDY;  Surgeon: Belva Crome, MD;  Location: [place] [facility];  Service: Cardiovascular;  Laterality: N/A;  ? INTRAVASCULAR ULTRASOUND/IVUS N/A 11/20/2016  ? Procedure: Intravascular Ultrasound/IVUS;  Surgeon: Nelva Bush, MD;  Location: [place] [facility];  Service: Cardiovascular;  Laterality: N/A;  ? KNEE ARTHROSCOPY Left 1983  ? LEFT HEART CATH AND CORONARY ANGIOGRAPHY N/A 11/20/2016  ? Procedure: LEFT HEART CATH AND CORONARY ANGIOGRAPHY;  Surgeon: Nelva Bush, MD;  Location: [place] [facility];  Service: Cardiovascular;  Laterality: N/A;  ? LEFT HEART CATH AND CORONARY ANGIOGRAPHY N/A 09/09/2017  ? Procedure: LEFT HEART CATH AND CORONARY ANGIOGRAPHY;  Surgeon: Burnell Blanks, MD;  Location: [place] [facility];  Service: Cardiovascular;  Laterality: N/A;  ? LEFT HEART CATH AND CORONARY ANGIOGRAPHY N/A 09/16/2017  ? Procedure: LEFT HEART CATH AND CORONARY ANGIOGRAPHY;  Surgeon: Belva Crome, MD;  Location: [place] [facility];  Service: Cardiovascular;  Laterality: N/A;  ? LEFT HEART CATH AND CORONARY ANGIOGRAPHY N/A 02/21/2018  ? Procedure: LEFT HEART CATH AND CORONARY ANGIOGRAPHY;  Surgeon: Sherren Mocha, MD;  Location: [place] [facility];  Service: Cardiovascular;  Laterality: N/A;  ? LUMBAR FUSION  12/11/2020  ? L1-2-3-4  ? TONSILLECTOMY    ? ULTRASOUND GUIDANCE FOR VASCULAR ACCESS  03/02/2018  ? Procedure: Ultrasound Guidance For Vascular Access;  Surgeon: Martinique, Peter M, MD;  Location: [place] [facility];  Service: Cardiovascular;;  ? ? ?Current Medications: ?Current Meds  ?Medication Sig  ? allopurinol (ZYLOPRIM) 100 MG tablet Take 100 mg by mouth daily.  ? amLODipine (NORVASC) 10 MG tablet Take 1 tablet (10 mg total) by mouth daily.  ? aspirin EC 81 MG tablet Take 1 tablet (81 mg total) by mouth daily.  ? clopidogrel (PLAVIX) 75 MG tablet TAKE ONE TABLET BY MOUTH ONCE DAILY WITH BREAKFAST ?Restart on  12/13/20 (Patient taking differently: Take 75 mg by mouth once. TAKE ONE TABLET BY MOUTH ONCE DAILY WITH BREAKFAST ?Restart on 12/13/20)  ? gabapentin (NEURONTIN) 300 MG capsule Take 300 mg by mouth 3 (three) times daily.  ? HYDROmorphone (DILAUDID) 2 MG tablet Take 1 tablet (2 mg total) by mouth every 4 (four) hours as needed (pain). (Patient taking differently: Take 2 mg by mouth every 4 (four) hours as needed for moderate pain or severe pain (pain).)  ? isosorbide mononitrate (IMDUR) 120 MG 24 hr tablet Please take 1.5 tablets daily (Patient taking differently: Take 180 mg by mouth daily. Please take 1.5 tablets daily)  ? metoprolol tartrate (LOPRESSOR) 50 MG tablet TAKE 1 AND 1/2 TABLETS BY MOUTH TWICE DAILY (Patient taking differently: Take 75 mg by mouth 2 (two) times daily.)  ? nitroGLYCERIN (NITROSTAT) 0.4 MG SL tablet DISSOLVE 1 TABLET UNDER THE TONGUE EVERY 5 MINUTES AS NEEDED FOR CHEST PAIN. DO NOT EXCEED A TOTAL OF 3 DOSES IN 15 MINUTES. (Patient taking differently: Place 0.4 mg under the tongue every 5 (five) minutes as needed for  chest pain.)  ? ondansetron (ZOFRAN) 4 MG tablet Take 1 tablet (4 mg total) by mouth daily as needed for nausea or vomiting.  ? ranolazine (RANEXA) 1000 MG SR tablet Take 1 tablet (1,000 mg total) by mouth 2 (two) times daily.  ? rosuvastatin (CRESTOR) 40 MG tablet TAKE ONE TABLET BY MOUTH ONCE DAILY (Patient taking differently: Take 40 mg by mouth daily.)  ? Sodium Sulfate-Mag Sulfate-KCl (SUTAB) [phone] MG TABS Take 1 tablet by mouth daily as needed (patient is unsure of why it states PRN but confirm he is on this med).  ? VASCEPA 1 g capsule Take 2 capsules (2 g total) by mouth 2 times daily at 12 noon and 4 pm. (Patient taking differently: Take 2 g by mouth 2 (two) times daily.)  ? Vitamin D, Ergocalciferol, (DRISDOL) 50000 units CAPS capsule Take 50,000 Units by mouth every Sunday.   ?  ? ?Allergies:   Penicillin g, Penicillins, Hydrocodone, Oxycodone, and Zolpidem   ? ?Social History  ? ?Socioeconomic History  ? Marital status: Married  ?  Spouse name: Not on file  ? Number of children: Not on file  ? Years of education: Not on file  ? Highest education level: Not on file

## 2021-08-05 ENCOUNTER — Ambulatory Visit (INDEPENDENT_AMBULATORY_CARE_PROVIDER_SITE_OTHER): Payer: PPO

## 2021-08-05 DIAGNOSIS — R079 Chest pain, unspecified: Secondary | ICD-10-CM

## 2021-08-05 DIAGNOSIS — R0602 Shortness of breath: Secondary | ICD-10-CM | POA: Diagnosis not present

## 2021-09-04 ENCOUNTER — Other Ambulatory Visit: Payer: Self-pay | Admitting: Cardiology

## 2021-09-30 ENCOUNTER — Other Ambulatory Visit: Payer: Self-pay | Admitting: Cardiology

## 2021-11-03 ENCOUNTER — Other Ambulatory Visit: Payer: Self-pay | Admitting: Neurological Surgery

## 2021-11-10 ENCOUNTER — Encounter (HOSPITAL_COMMUNITY): Payer: Self-pay | Admitting: Neurological Surgery

## 2021-11-10 ENCOUNTER — Telehealth: Payer: Self-pay | Admitting: *Deleted

## 2021-11-10 ENCOUNTER — Other Ambulatory Visit: Payer: Self-pay

## 2021-11-10 NOTE — Telephone Encounter (Signed)
Attempted to contact patient as part of preoperative protocol.  Instructed patient to return call to cardiology office.  Jossie Ng. Jaley Yan NP-C     11/10/2021, 5:00 PM [address] Office [phone] Fax [phone]

## 2021-11-10 NOTE — Telephone Encounter (Signed)
     Primary Cardiologist: Jenne Campus, MD  Chart reviewed as part of pre-operative protocol coverage. Given past medical history and time since last visit, based on ACC/AHA guidelines, Robert Ramos would be at acceptable risk for the planned procedure without further cardiovascular testing.   Patient was advised that if he develops new symptoms prior to surgery to contact our office to arrange a follow-up appointment.  He verbalized understanding.  Patient reports that he discontinued his Plavix several months ago per direction from Dr. Agustin Cree.  He also reports that he stopped taking his aspirin on 10/28/2021.  Please resume aspirin therapy as soon as hemostasis is achieved.  I will route this recommendation to the requesting party via Epic fax function and remove from pre-op pool.  Please call with questions.  Jossie Ng. Ebonie Westerlund NP-C     11/10/2021, 5:07 PM [place] [address] Office [phone] Fax [phone]

## 2021-11-10 NOTE — Telephone Encounter (Signed)
Attempted to contact patient as part of preoperative protocol.  Phone number on chart goes straight to voicemail.  I left voicemail instructing patient to contact cardiology clinic for preoperative evaluation.  Jossie Ng. Tadao Emig NP-C     11/10/2021, 4:56 PM [place] Office [phone] Fax [phone]

## 2021-11-10 NOTE — Telephone Encounter (Signed)
   Pre-operative Risk Assessment    Patient Name: Robert Ramos  DOB: December 09, 1950 MRN: [id number]      Request for Surgical Clearance    Procedure:   L3-4 SUBLAMINAR DECOMPRESSION  Date of Surgery:  Clearance 11/11/21                                 Surgeon:  DR. Kristeen Miss Surgeon's Group or Practice Name:  [facility] Phone number:  [phone] Fax number:  [phone] ATTN: JESSICA   Type of Clearance Requested:   - Medical  - Pharmacy:  Hold Aspirin and Clopidogrel (Plavix)     Type of Anesthesia:  General    Additional requests/questions:    Jiles Prows   11/10/2021, 2:19 PM

## 2021-11-10 NOTE — Progress Notes (Signed)
Robert Ramos denies chest pain or shortness of breath. Patient denies having any s/s of Covid in his household.  Patient denies any known exposure to Covid.   Robert Ramos 's PCP is Dr. Evelene Croon, cardiologist is Dr, Jenne Campus.  Robert Ramos reports that Dr. Agustin Cree stopped Plavix several months ago. Patient was not giving instruction by surgeon to stop ASA, Robert Ramos stated that he stopped ASA on 10/28/24, because it is usually stopped before procedures.

## 2021-11-10 NOTE — Telephone Encounter (Signed)
I left message for the pt to call ASAP. I also tried his wife# DPR on file, but no answer.   Per the pre op provider to see if pt was available for a tele preop appt today at 3 pm.

## 2021-11-11 ENCOUNTER — Encounter (HOSPITAL_COMMUNITY): Payer: Self-pay | Admitting: Neurological Surgery

## 2021-11-11 ENCOUNTER — Other Ambulatory Visit: Payer: Self-pay

## 2021-11-11 ENCOUNTER — Ambulatory Visit (HOSPITAL_COMMUNITY): Payer: No Typology Code available for payment source | Admitting: Physician Assistant

## 2021-11-11 ENCOUNTER — Ambulatory Visit (HOSPITAL_COMMUNITY): Payer: PPO

## 2021-11-11 ENCOUNTER — Encounter (HOSPITAL_COMMUNITY)
Admission: RE | Disposition: A | Discharge status: Home / Self Care | Payer: Self-pay | Source: Home / Self Care | Attending: Neurological Surgery

## 2021-11-11 ENCOUNTER — Observation Stay (HOSPITAL_COMMUNITY)
Admission: RE | Admit: 2021-11-11 | Discharge: 2021-11-12 | Disposition: A | Discharge status: Home / Self Care | Payer: No Typology Code available for payment source | Attending: Neurological Surgery | Admitting: Neurological Surgery

## 2021-11-11 ENCOUNTER — Ambulatory Visit (HOSPITAL_BASED_OUTPATIENT_CLINIC_OR_DEPARTMENT_OTHER): Payer: No Typology Code available for payment source | Admitting: Physician Assistant

## 2021-11-11 DIAGNOSIS — Z7982 Long term (current) use of aspirin: Secondary | ICD-10-CM | POA: Diagnosis not present

## 2021-11-11 DIAGNOSIS — I129 Hypertensive chronic kidney disease with stage 1 through stage 4 chronic kidney disease, or unspecified chronic kidney disease: Secondary | ICD-10-CM | POA: Diagnosis not present

## 2021-11-11 DIAGNOSIS — Z79899 Other long term (current) drug therapy: Secondary | ICD-10-CM | POA: Diagnosis not present

## 2021-11-11 DIAGNOSIS — I2510 Atherosclerotic heart disease of native coronary artery without angina pectoris: Secondary | ICD-10-CM | POA: Insufficient documentation

## 2021-11-11 DIAGNOSIS — N183 Chronic kidney disease, stage 3 unspecified: Secondary | ICD-10-CM | POA: Diagnosis not present

## 2021-11-11 DIAGNOSIS — Z981 Arthrodesis status: Secondary | ICD-10-CM | POA: Diagnosis not present

## 2021-11-11 DIAGNOSIS — Z87891 Personal history of nicotine dependence: Secondary | ICD-10-CM | POA: Insufficient documentation

## 2021-11-11 DIAGNOSIS — Z7902 Long term (current) use of antithrombotics/antiplatelets: Secondary | ICD-10-CM | POA: Insufficient documentation

## 2021-11-11 DIAGNOSIS — I252 Old myocardial infarction: Secondary | ICD-10-CM | POA: Diagnosis not present

## 2021-11-11 DIAGNOSIS — M48062 Spinal stenosis, lumbar region with neurogenic claudication: Principal | ICD-10-CM | POA: Insufficient documentation

## 2021-11-11 DIAGNOSIS — M5416 Radiculopathy, lumbar region: Secondary | ICD-10-CM

## 2021-11-11 HISTORY — PX: LUMBAR LAMINECTOMY/DECOMPRESSION MICRODISCECTOMY: SHX5026

## 2021-11-11 HISTORY — DX: Spinal stenosis, lumbar region with neurogenic claudication: M48.062

## 2021-11-11 HISTORY — DX: Dyspnea, unspecified: R06.00

## 2021-11-11 HISTORY — DX: Gastro-esophageal reflux disease without esophagitis: K21.9

## 2021-11-11 LAB — SURGICAL PCR SCREEN
MRSA, PCR: NEGATIVE
Staphylococcus aureus: NEGATIVE

## 2021-11-11 LAB — CBC
HCT: 36 % — ABNORMAL LOW (ref 39.0–52.0)
Hemoglobin: 11.9 g/dL — ABNORMAL LOW (ref 13.0–17.0)
MCH: 32 pg (ref 26.0–34.0)
MCHC: 33.1 g/dL (ref 30.0–36.0)
MCV: 96.8 fL (ref 80.0–100.0)
Platelets: 143 10*3/uL — ABNORMAL LOW (ref 150–400)
RBC: 3.72 MIL/uL — ABNORMAL LOW (ref 4.22–5.81)
RDW: 14.6 % (ref 11.5–15.5)
WBC: 7 10*3/uL (ref 4.0–10.5)
nRBC: 0 % (ref 0.0–0.2)

## 2021-11-11 LAB — BASIC METABOLIC PANEL
Anion gap: 7 (ref 5–15)
BUN: 10 mg/dL (ref 8–23)
CO2: 26 mmol/L (ref 22–32)
Calcium: 9.4 mg/dL (ref 8.9–10.3)
Chloride: 107 mmol/L (ref 98–111)
Creatinine, Ser: 1.27 mg/dL — ABNORMAL HIGH (ref 0.61–1.24)
GFR, Estimated: >60 mL/min (ref >60)
Glucose, Bld: 130 mg/dL — ABNORMAL HIGH (ref 70–99)
Potassium: 3.6 mmol/L (ref 3.5–5.1)
Sodium: 140 mmol/L (ref 135–145)

## 2021-11-11 SURGERY — LUMBAR LAMINECTOMY/DECOMPRESSION MICRODISCECTOMY 1 LEVEL
Anesthesia: General | Site: Spine Lumbar

## 2021-11-11 MED ORDER — FENTANYL CITRATE (PF) 100 MCG/2ML IJ SOLN
INTRAMUSCULAR | Status: AC
  Filled 2021-11-11: qty 2

## 2021-11-11 MED ORDER — ACETAMINOPHEN 500 MG PO TABS
1000.0000 mg | ORAL_TABLET | Freq: Once | ORAL | Status: AC
  Administered 2021-11-11: 1000 mg via ORAL
  Filled 2021-11-11: qty 2

## 2021-11-11 MED ORDER — CHLORHEXIDINE GLUCONATE CLOTH 2 % EX PADS: 6.0000 | MEDICATED_PAD | Freq: Once | CUTANEOUS | Status: DC

## 2021-11-11 MED ORDER — LIDOCAINE-EPINEPHRINE 1 %-1:100000 IJ SOLN
INTRAMUSCULAR | Status: DC | PRN
  Administered 2021-11-11: 5 mL

## 2021-11-11 MED ORDER — POLYETHYLENE GLYCOL 3350 17 G PO PACK
17.0000 g | PACK | Freq: Every day | ORAL | Status: DC | PRN
Start: 2021-11-11 — End: 2021-11-12

## 2021-11-11 MED ORDER — MORPHINE SULFATE (PF) 2 MG/ML IV SOLN: 2.0000 mg | INTRAVENOUS | Status: DC | PRN

## 2021-11-11 MED ORDER — ROSUVASTATIN CALCIUM 20 MG PO TABS
40.0000 mg | ORAL_TABLET | Freq: Every day | ORAL | Status: DC
  Administered 2021-11-11 – 2021-11-12 (×2): 40 mg via ORAL
  Filled 2021-11-11 (×2): qty 2

## 2021-11-11 MED ORDER — BISACODYL 10 MG RE SUPP: 10.0000 mg | Freq: Every day | RECTAL | Status: DC | PRN

## 2021-11-11 MED ORDER — SODIUM CHLORIDE 0.9% FLUSH
3.0000 mL | Freq: Two times a day (BID) | INTRAVENOUS | Status: DC
Start: 2021-11-11 — End: 2021-11-12
  Administered 2021-11-11: 3 mL via INTRAVENOUS

## 2021-11-11 MED ORDER — CHLORHEXIDINE GLUCONATE 0.12 % MT SOLN
15.0000 mL | Freq: Once | OROMUCOSAL | Status: AC
  Administered 2021-11-11: 15 mL via OROMUCOSAL
  Filled 2021-11-11: qty 15

## 2021-11-11 MED ORDER — PROPOFOL 10 MG/ML IV BOLUS
INTRAVENOUS | Status: AC
  Filled 2021-11-11: qty 20

## 2021-11-11 MED ORDER — FENTANYL CITRATE (PF) 250 MCG/5ML IJ SOLN
INTRAMUSCULAR | Status: DC | PRN
  Administered 2021-11-11 (×2): 50 ug via INTRAVENOUS
  Administered 2021-11-11: 150 ug via INTRAVENOUS

## 2021-11-11 MED ORDER — ACETAMINOPHEN 325 MG PO TABS: 650.0000 mg | ORAL_TABLET | ORAL | Status: DC | PRN

## 2021-11-11 MED ORDER — CEFAZOLIN SODIUM 1 G IJ SOLR
INTRAMUSCULAR | Status: AC
  Filled 2021-11-11: qty 20

## 2021-11-11 MED ORDER — ONDANSETRON HCL 4 MG/2ML IJ SOLN
4.0000 mg | Freq: Once | INTRAMUSCULAR | Status: AC | PRN
  Administered 2021-11-11: 4 mg via INTRAVENOUS

## 2021-11-11 MED ORDER — PROPOFOL 10 MG/ML IV BOLUS
INTRAVENOUS | Status: DC | PRN
  Administered 2021-11-11: 160 mg via INTRAVENOUS

## 2021-11-11 MED ORDER — ORAL CARE MOUTH RINSE: 15.0000 mL | Freq: Once | OROMUCOSAL | Status: AC

## 2021-11-11 MED ORDER — METOPROLOL TARTRATE 50 MG PO TABS
50.0000 mg | ORAL_TABLET | Freq: Once | ORAL | Status: AC
  Administered 2021-11-11: 50 mg via ORAL
  Filled 2021-11-11: qty 1

## 2021-11-11 MED ORDER — DOCUSATE SODIUM 100 MG PO CAPS
100.0000 mg | ORAL_CAPSULE | Freq: Two times a day (BID) | ORAL | Status: DC
  Administered 2021-11-11 – 2021-11-12 (×2): 100 mg via ORAL
  Filled 2021-11-11 (×2): qty 1

## 2021-11-11 MED ORDER — LACTATED RINGERS IV SOLN: INTRAVENOUS | Status: DC

## 2021-11-11 MED ORDER — NITROGLYCERIN 0.4 MG SL SUBL: 0.4000 mg | SUBLINGUAL_TABLET | SUBLINGUAL | Status: DC | PRN

## 2021-11-11 MED ORDER — ONDANSETRON HCL 4 MG PO TABS
4.0000 mg | ORAL_TABLET | Freq: Four times a day (QID) | ORAL | Status: DC | PRN
Start: 2021-11-11 — End: 2021-11-12

## 2021-11-11 MED ORDER — METOPROLOL TARTRATE 50 MG PO TABS
ORAL_TABLET | ORAL | Status: AC
  Filled 2021-11-11: qty 1

## 2021-11-11 MED ORDER — KETOROLAC TROMETHAMINE 15 MG/ML IJ SOLN
7.5000 mg | Freq: Four times a day (QID) | INTRAMUSCULAR | Status: DC
  Administered 2021-11-11 – 2021-11-12 (×3): 7.5 mg via INTRAVENOUS
  Filled 2021-11-11 (×3): qty 1

## 2021-11-11 MED ORDER — 0.9 % SODIUM CHLORIDE (POUR BTL) OPTIME
TOPICAL | Status: DC | PRN
  Administered 2021-11-11: 1000 mL

## 2021-11-11 MED ORDER — LIDOCAINE-EPINEPHRINE 1 %-1:100000 IJ SOLN
INTRAMUSCULAR | Status: AC
Start: 2021-11-11 — End: ?
  Filled 2021-11-11: qty 1

## 2021-11-11 MED ORDER — THROMBIN (RECOMBINANT) 5000 UNITS EX SOLR
CUTANEOUS | Status: AC
  Filled 2021-11-11: qty 10000

## 2021-11-11 MED ORDER — LIDOCAINE 2% (20 MG/ML) 5 ML SYRINGE
INTRAMUSCULAR | Status: DC | PRN
  Administered 2021-11-11: 60 mg via INTRAVENOUS

## 2021-11-11 MED ORDER — ROCURONIUM BROMIDE 10 MG/ML (PF) SYRINGE
PREFILLED_SYRINGE | INTRAVENOUS | Status: DC | PRN
  Administered 2021-11-11: 100 mg via INTRAVENOUS

## 2021-11-11 MED ORDER — FENTANYL CITRATE (PF) 250 MCG/5ML IJ SOLN
INTRAMUSCULAR | Status: AC
  Filled 2021-11-11: qty 5

## 2021-11-11 MED ORDER — METHOCARBAMOL 500 MG PO TABS
500.0000 mg | ORAL_TABLET | Freq: Four times a day (QID) | ORAL | Status: DC | PRN
  Administered 2021-11-11 – 2021-11-12 (×2): 500 mg via ORAL
  Filled 2021-11-11 (×2): qty 1

## 2021-11-11 MED ORDER — BUPIVACAINE HCL (PF) 0.5 % IJ SOLN
INTRAMUSCULAR | Status: DC | PRN
  Administered 2021-11-11: 5 mL
  Administered 2021-11-11: 25 mL

## 2021-11-11 MED ORDER — TRAMADOL HCL 50 MG PO TABS
50.0000 mg | ORAL_TABLET | Freq: Four times a day (QID) | ORAL | Status: DC | PRN
  Administered 2021-11-11: 50 mg via ORAL
  Filled 2021-11-11: qty 1

## 2021-11-11 MED ORDER — SUGAMMADEX SODIUM 200 MG/2ML IV SOLN
INTRAVENOUS | Status: DC | PRN
  Administered 2021-11-11: 200 mg via INTRAVENOUS

## 2021-11-11 MED ORDER — BUPIVACAINE HCL (PF) 0.5 % IJ SOLN
INTRAMUSCULAR | Status: AC
  Filled 2021-11-11: qty 30

## 2021-11-11 MED ORDER — SODIUM CHLORIDE 0.9 % IV SOLN
250.0000 mL | INTRAVENOUS | Status: DC
Start: 2021-11-11 — End: 2021-11-12

## 2021-11-11 MED ORDER — EPHEDRINE SULFATE-NACL 50-0.9 MG/10ML-% IV SOSY
PREFILLED_SYRINGE | INTRAVENOUS | Status: DC | PRN
  Administered 2021-11-11: 15 mg via INTRAVENOUS
  Administered 2021-11-11: 10 mg via INTRAVENOUS

## 2021-11-11 MED ORDER — SODIUM CHLORIDE 0.9% FLUSH: 3.0000 mL | INTRAVENOUS | Status: DC | PRN

## 2021-11-11 MED ORDER — SENNA 8.6 MG PO TABS
1.0000 | ORAL_TABLET | Freq: Two times a day (BID) | ORAL | Status: DC
Start: 2021-11-11 — End: 2021-11-12
  Administered 2021-11-11 – 2021-11-12 (×2): 8.6 mg via ORAL
  Filled 2021-11-11 (×2): qty 1

## 2021-11-11 MED ORDER — ONDANSETRON HCL 4 MG/2ML IJ SOLN
4.0000 mg | Freq: Four times a day (QID) | INTRAMUSCULAR | Status: DC | PRN
  Administered 2021-11-12: 4 mg via INTRAVENOUS
  Filled 2021-11-11: qty 2

## 2021-11-11 MED ORDER — DEXAMETHASONE SODIUM PHOSPHATE 10 MG/ML IJ SOLN
INTRAMUSCULAR | Status: AC
  Filled 2021-11-11: qty 1

## 2021-11-11 MED ORDER — METHOCARBAMOL 1000 MG/10ML IJ SOLN: 500.0000 mg | Freq: Four times a day (QID) | INTRAVENOUS | Status: DC | PRN

## 2021-11-11 MED ORDER — ONDANSETRON HCL 4 MG/2ML IJ SOLN
INTRAMUSCULAR | Status: AC
  Filled 2021-11-11: qty 2

## 2021-11-11 MED ORDER — ISOSORBIDE MONONITRATE ER 60 MG PO TB24
180.0000 mg | ORAL_TABLET | Freq: Every day | ORAL | Status: DC
  Administered 2021-11-11: 180 mg via ORAL
  Filled 2021-11-11 (×2): qty 3

## 2021-11-11 MED ORDER — RANOLAZINE ER 500 MG PO TB12
1000.0000 mg | ORAL_TABLET | Freq: Two times a day (BID) | ORAL | Status: DC
  Administered 2021-11-11 – 2021-11-12 (×2): 1000 mg via ORAL
  Filled 2021-11-11 (×2): qty 2

## 2021-11-11 MED ORDER — PHENYLEPHRINE HCL-NACL 20-0.9 MG/250ML-% IV SOLN
INTRAVENOUS | Status: DC | PRN
  Administered 2021-11-11: 50 ug/min via INTRAVENOUS

## 2021-11-11 MED ORDER — METOPROLOL TARTRATE 25 MG PO TABS
75.0000 mg | ORAL_TABLET | Freq: Two times a day (BID) | ORAL | Status: DC
  Administered 2021-11-11: 75 mg via ORAL
  Filled 2021-11-11: qty 3

## 2021-11-11 MED ORDER — AMLODIPINE BESYLATE 5 MG PO TABS
10.0000 mg | ORAL_TABLET | Freq: Every day | ORAL | Status: DC
  Administered 2021-11-11: 10 mg via ORAL
  Filled 2021-11-11: qty 2

## 2021-11-11 MED ORDER — PHENYLEPHRINE 80 MCG/ML (10ML) SYRINGE FOR IV PUSH (FOR BLOOD PRESSURE SUPPORT)
PREFILLED_SYRINGE | INTRAVENOUS | Status: DC | PRN
  Administered 2021-11-11 (×3): 160 ug via INTRAVENOUS
  Administered 2021-11-11: 200 ug via INTRAVENOUS
  Administered 2021-11-11: 160 ug via INTRAVENOUS

## 2021-11-11 MED ORDER — ACETAMINOPHEN 650 MG RE SUPP: 650.0000 mg | RECTAL | Status: DC | PRN

## 2021-11-11 MED ORDER — MENTHOL 3 MG MT LOZG
1.0000 | LOZENGE | OROMUCOSAL | Status: DC | PRN
Start: 2021-11-11 — End: 2021-11-12
  Administered 2021-11-12: 3 mg via ORAL
  Filled 2021-11-11: qty 9

## 2021-11-11 MED ORDER — HYDROMORPHONE HCL 1 MG/ML IJ SOLN: 0.2500 mg | INTRAMUSCULAR | Status: DC | PRN

## 2021-11-11 MED ORDER — FENTANYL CITRATE (PF) 100 MCG/2ML IJ SOLN
25.0000 ug | INTRAMUSCULAR | Status: DC | PRN
  Administered 2021-11-11 (×2): 50 ug via INTRAVENOUS

## 2021-11-11 MED ORDER — VANCOMYCIN HCL IN DEXTROSE 1-5 GM/200ML-% IV SOLN
1000.0000 mg | INTRAVENOUS | Status: AC
  Administered 2021-11-11: 1000 mg via INTRAVENOUS
  Filled 2021-11-11: qty 200

## 2021-11-11 MED ORDER — PHENYLEPHRINE 80 MCG/ML (10ML) SYRINGE FOR IV PUSH (FOR BLOOD PRESSURE SUPPORT)
PREFILLED_SYRINGE | INTRAVENOUS | Status: AC
Start: 2021-11-11 — End: ?
  Filled 2021-11-11: qty 10

## 2021-11-11 MED ORDER — VANCOMYCIN HCL IN DEXTROSE 1-5 GM/200ML-% IV SOLN
1000.0000 mg | Freq: Once | INTRAVENOUS | Status: AC
  Administered 2021-11-11: 1000 mg via INTRAVENOUS
  Filled 2021-11-11: qty 200

## 2021-11-11 MED ORDER — DEXAMETHASONE SODIUM PHOSPHATE 10 MG/ML IJ SOLN
INTRAMUSCULAR | Status: DC | PRN
  Administered 2021-11-11: 10 mg via INTRAVENOUS

## 2021-11-11 MED ORDER — ALLOPURINOL 100 MG PO TABS
100.0000 mg | ORAL_TABLET | Freq: Every day | ORAL | Status: DC
  Administered 2021-11-11 – 2021-11-12 (×2): 100 mg via ORAL
  Filled 2021-11-11 (×2): qty 1

## 2021-11-11 MED ORDER — THROMBIN (RECOMBINANT) 5000 UNITS EX SOLR: CUTANEOUS | Status: DC | PRN

## 2021-11-11 MED ORDER — AMISULPRIDE (ANTIEMETIC) 5 MG/2ML IV SOLN: 10.0000 mg | Freq: Once | INTRAVENOUS | Status: DC | PRN

## 2021-11-11 MED ORDER — ICOSAPENT ETHYL 1 G PO CAPS
2.0000 g | ORAL_CAPSULE | Freq: Two times a day (BID) | ORAL | Status: DC
  Administered 2021-11-11 – 2021-11-12 (×2): 2 g via ORAL
  Filled 2021-11-11 (×2): qty 2

## 2021-11-11 MED ORDER — ONDANSETRON HCL 4 MG/2ML IJ SOLN
INTRAMUSCULAR | Status: DC | PRN
  Administered 2021-11-11: 4 mg via INTRAVENOUS

## 2021-11-11 MED ORDER — FLEET ENEMA 7-19 GM/118ML RE ENEM: 1.0000 | ENEMA | Freq: Once | RECTAL | Status: DC | PRN

## 2021-11-11 MED ORDER — PHENOL 1.4 % MT LIQD: 1.0000 | OROMUCOSAL | Status: DC | PRN

## 2021-11-11 MED ORDER — CEFAZOLIN SODIUM-DEXTROSE 2-4 GM/100ML-% IV SOLN: 2.0000 g | Freq: Three times a day (TID) | INTRAVENOUS | Status: DC

## 2021-11-11 SURGICAL SUPPLY — 46 items
BAG COUNTER SPONGE SURGICOUNT (BAG) ×2 IMPLANT
BAND RUBBER #18 3X1/16 STRL (MISCELLANEOUS) IMPLANT
BLADE CLIPPER SURG (BLADE) IMPLANT
BUR ACORN 6.0 (BURR) IMPLANT
BUR MATCHSTICK NEURO 3.0 LAGG (BURR) ×2 IMPLANT
CANISTER SUCT 3000ML PPV (MISCELLANEOUS) ×2 IMPLANT
DERMABOND ADVANCED (GAUZE/BANDAGES/DRESSINGS) ×1
DERMABOND ADVANCED .7 DNX12 (GAUZE/BANDAGES/DRESSINGS) ×1 IMPLANT
DEVICE DISSECT PLASMABLAD 3.0S (MISCELLANEOUS) ×1 IMPLANT
DRAPE HALF SHEET 40X57 (DRAPES) IMPLANT
DRAPE LAPAROTOMY T 102X78X121 (DRAPES) ×2 IMPLANT
DRAPE MICROSCOPE LEICA (MISCELLANEOUS) IMPLANT
DRSG OPSITE POSTOP 4X6 (GAUZE/BANDAGES/DRESSINGS) ×1 IMPLANT
DURAPREP 26ML APPLICATOR (WOUND CARE) ×2 IMPLANT
ELECT REM PT RETURN 9FT ADLT (ELECTROSURGICAL) ×2
ELECTRODE REM PT RTRN 9FT ADLT (ELECTROSURGICAL) ×1 IMPLANT
GAUZE 4X4 16PLY ~~LOC~~+RFID DBL (SPONGE) IMPLANT
GAUZE SPONGE 4X4 12PLY STRL (GAUZE/BANDAGES/DRESSINGS) ×2 IMPLANT
GLOVE BIOGEL PI IND STRL 8.5 (GLOVE) ×1 IMPLANT
GLOVE BIOGEL PI INDICATOR 8.5 (GLOVE) ×1
GLOVE ECLIPSE 8.5 STRL (GLOVE) ×2 IMPLANT
GOWN STRL REUS W/ TWL LRG LVL3 (GOWN DISPOSABLE) IMPLANT
GOWN STRL REUS W/ TWL XL LVL3 (GOWN DISPOSABLE) IMPLANT
GOWN STRL REUS W/TWL 2XL LVL3 (GOWN DISPOSABLE) ×2 IMPLANT
GOWN STRL REUS W/TWL LRG LVL3 (GOWN DISPOSABLE)
GOWN STRL REUS W/TWL XL LVL3 (GOWN DISPOSABLE)
KIT BASIN OR (CUSTOM PROCEDURE TRAY) ×2 IMPLANT
KIT TURNOVER KIT B (KITS) ×2 IMPLANT
NDL SPNL 20GX3.5 QUINCKE YW (NEEDLE) IMPLANT
NEEDLE HYPO 22GX1.5 SAFETY (NEEDLE) ×2 IMPLANT
NEEDLE SPNL 20GX3.5 QUINCKE YW (NEEDLE) IMPLANT
NS IRRIG 1000ML POUR BTL (IV SOLUTION) ×2 IMPLANT
PACK LAMINECTOMY NEURO (CUSTOM PROCEDURE TRAY) ×2 IMPLANT
PAD ARMBOARD 7.5X6 YLW CONV (MISCELLANEOUS) ×6 IMPLANT
PATTIES SURGICAL .5 X1 (DISPOSABLE) ×2 IMPLANT
PLASMABLADE 3.0S (MISCELLANEOUS) ×2
SPIKE FLUID TRANSFER (MISCELLANEOUS) ×2 IMPLANT
SPONGE SURGIFOAM ABS GEL SZ50 (HEMOSTASIS) ×2 IMPLANT
SUT VIC AB 1 CT1 18XBRD ANBCTR (SUTURE) ×1 IMPLANT
SUT VIC AB 1 CT1 8-18 (SUTURE) ×1
SUT VIC AB 2-0 CP2 18 (SUTURE) ×2 IMPLANT
SUT VIC AB 3-0 SH 8-18 (SUTURE) ×2 IMPLANT
SUT VIC AB 4-0 RB1 18 (SUTURE) ×2 IMPLANT
TOWEL GREEN STERILE (TOWEL DISPOSABLE) ×2 IMPLANT
TOWEL GREEN STERILE FF (TOWEL DISPOSABLE) ×2 IMPLANT
WATER STERILE IRR 1000ML POUR (IV SOLUTION) ×2 IMPLANT

## 2021-11-11 NOTE — Op Note (Signed)
Date of surgery: 11/11/2021 Preoperative diagnosis: Lumbar stenosis L3-L4 with neurogenic claudication, lumbar radiculopathy.  History of decompression fusion L4 to sacrum. Postoperative diagnosis: Same Procedure: Bilateral laminotomies and decompression L3-L4 Surgeon: Kristeen Miss Anesthesia: General endotracheal Indications: Robert Ramos is a 71 year old male who is had previous decompression fusion at L4-L5.  He has developed an evolved significantly adjacent level stenosis at L3-L4.  Having failed efforts at conservative management I advised surgical decompression at this level.  Procedure: Patient was brought to the operating room supine on the stretcher.  After the smooth induction of general tracheal anesthesia, he was carefully turned prone.  The back was prepped with alcohol DuraPrep and draped in a sterile fashion.  Midline incision was then created at the superior margin of the previous incision and this was carried down to the lumbodorsal fascia.  The fascia was opened on either side of midline and a subperiosteal dissection was undertaken to expose L3-L4.  Confirmatory x-ray was obtained verifying the position of L3-L4.  Then a laminotomy was created removing the inferior margin lamina of L3 out to the mesial wall the facet.  Partial medial facetectomy was completed and the thickened redundant yellow ligament in this region was taken up.  Underneath the L ligament was noted to be a substantial quantity of epidural fat which created a considerable portion of the stenosis.  This was resected also.  The dissection was carried inferiorly to perform laminotomies and foraminotomies along the L4 nerve root inferiorly.  The dissection was then carried superiorly to make sure that all of the ligamentous and fatty overgrowth superiorly under the lamina of L3 was decompressed when this was verified hemostasis in the soft tissues was obtained meticulously the retractors were removed and the lumbodorsal  fascia was closed with #1 Vicryl in interrupted fashion 2-0 Vicryl was used in the subcutaneous tissues 3-0 Vicryl 4-0 Vicryl subcuticularly Dermabond was placed on the skin.  Blood loss was estimated at approximately 75 cc.

## 2021-11-11 NOTE — Transfer of Care (Signed)
Immediate Anesthesia Transfer of Care Note  Patient: Robert Ramos  Procedure(s) Performed: LUMBAR THREE-FOUR SUBLAMINAR DECOMPRESSION (Spine Lumbar)  Patient Location: PACU  Anesthesia Type:General  Level of Consciousness: drowsy and patient cooperative  Airway & Oxygen Therapy: Patient Spontanous Breathing  Post-op Assessment: Report given to RN and Post -op Vital signs reviewed and stable  Post vital signs: Reviewed and stable  Last Vitals:  Vitals Value Taken Time  BP    Temp    Pulse    Resp    SpO2      Last Pain:  Vitals:   11/11/21 1052  TempSrc:   PainSc: 6       Patients Stated Pain Goal: 3 (96/28/36 6294)  Complications: No notable events documented.

## 2021-11-11 NOTE — Anesthesia Preprocedure Evaluation (Addendum)
Anesthesia Evaluation  Patient identified by MRN, date of birth, ID band Patient awake    Reviewed: Allergy & Precautions, NPO status , Patient's Chart, lab work & pertinent test results, reviewed documented beta blocker date and time   History of Anesthesia Complications Negative for: history of anesthetic complications  Airway Mallampati: III  TM Distance: >3 FB Neck ROM: Full    Dental  (+) Teeth Intact, Dental Advisory Given   Pulmonary shortness of breath and with exertion, sleep apnea , former smoker,  Quit smoking 2002, 35 pack year history    breath sounds clear to auscultation       Cardiovascular hypertension, Pt. on medications and Pt. on home beta blockers + CAD, + Past MI (2002) and + Cardiac Stents   Rhythm:Regular  ? Ost 1st Sept lesion is 60% stenosed. ? Non-stenotic Prox LAD to Mid LAD lesion was previously treated. ? Mid LAD lesion is 60% stenosed. ? Ost 2nd Mrg lesion is 90% stenosed. ? Prox Cx to Mid Cx lesion is 30% stenosed. ? Mid Cx to Dist Cx lesion is 30% stenosed. ? Mid RCA-1 lesion is 40% stenosed. ? Mid RCA-2 lesion is 100% stenosed. ? Non-stenotic Post Atrio lesion was previously treated. ? Ost Ramus lesion is 50% stenosed.   1.  Patent left mainstem with no significant obstruction 2.  Moderate LAD stenosis unchanged from previous cath study when pressure wire analysis was negative 3.  Patent left circumflex with small vessel disease involving the OM branches not amenable to PCI 4.  Interval occlusion of the mid RCA with left-to-right collaterals supplying the PDA and PLA branches 5.  Normal LVEDP and normal LV systolic function by noninvasive assessment recommendations: Referral for consideration of CTO-PCI.  Discussed findings and treatment plan at length with the patient and his wife.  Case reviewed with Dr. Martinique.  Continue ASA and clopidogrel  1. Left ventricular ejection fraction, by  estimation, is 55 to 60%. The  left ventricle has normal function. The left ventricle has no regional  wall motion abnormalities. There is mild concentric left ventricular  hypertrophy. Left ventricular diastolic  parameters are consistent with Grade I diastolic dysfunction (impaired  relaxation).  2. Right ventricular systolic function is normal. The right ventricular  size is normal. There is normal pulmonary artery systolic pressure.  3. The mitral valve is normal in structure. Mild mitral valve  regurgitation. No evidence of mitral stenosis.  4. The aortic valve is tricuspid. Aortic valve regurgitation is mild.  Aortic valve sclerosis is present, with no evidence of aortic valve  stenosis.  5. Aortic Normal DTA.  6. The inferior vena cava is normal in size with greater than 50%  respiratory variability, suggesting right atrial pressure of 3 mmHg   Neuro/Psych  Neuromuscular disease negative psych ROS   GI/Hepatic Neg liver ROS, GERD  Medicated and Controlled,  Endo/Other  negative endocrine ROS  Renal/GU Renal diseaseLab Results      Component                Value               Date                      CREATININE               1.27 (H)            11/11/2021  Musculoskeletal  (+) Arthritis , Osteoarthritis,  Chronic LBP    Abdominal   Peds  Hematology  (+) Blood dyscrasia, anemia , Lab Results      Component                Value               Date                      WBC                      7.0                 11/11/2021                HGB                      11.9 (L)            11/11/2021                HCT                      36.0 (L)            11/11/2021                MCV                      96.8                11/11/2021                PLT                      143 (L)             11/11/2021              Anesthesia Other Findings   Reproductive/Obstetrics                            Anesthesia  Physical Anesthesia Plan  ASA: 3  Anesthesia Plan: General   Post-op Pain Management: Tylenol PO (pre-op)*   Induction: Intravenous  PONV Risk Score and Plan: 2 and Ondansetron, Dexamethasone and Treatment may vary due to age or medical condition  Airway Management Planned: Oral ETT  Additional Equipment: None  Intra-op Plan:   Post-operative Plan: Extubation in OR  Informed Consent: I have reviewed the patients History and Physical, chart, labs and discussed the procedure including the risks, benefits and alternatives for the proposed anesthesia with the patient or authorized representative who has indicated his/her understanding and acceptance.     Dental advisory given  Plan Discussed with: CRNA  Anesthesia Plan Comments:        Anesthesia Quick Evaluation

## 2021-11-11 NOTE — H&P (Signed)
Robert Ramos is an 71 y.o. male.   Chief Complaint: Bilateral back and leg pain HPI: Robert Ramos is a 71 year old individual whose had previous lumbar decompression L4 to the sacrum.  This was done a year ago by Dr. Erline Levine.  Patient has had a slow recovery and has had progressive worsening leg pain.  Recent MRI demonstrates that he is developed the substantial stenosis at the level of L3-L4 above his previous fusion from L4 to the sacrum.  Had some mild hypertrophy of the facets a year ago but this has progressed substantially to create a moderately severe stenosis at L3-4.  The disc height itself is well-maintained.  The patient has failed efforts at conservative management including several trials of epidural steroid injections.  I have advised surgical decompression via bilateral laminotomies and foraminotomies at L3-4 without fusing that joint.  Past Medical History:  Diagnosis Date   Anemia 09/28/2019   Arthritis    "hands, back" (03/02/2018)   ASHD (arteriosclerotic heart disease) 08/27/2015   Body mass index (BMI) 34.0-34.9, adult 03/15/2019   Chronic bilateral low back pain with bilateral sciatica 01/22/2020   Chronic pain of left knee 09/28/2019   Chronic pain syndrome 01/22/2020   Chronic radicular low back pain    Coronary artery disease    a. MI in 2002 s/p stenting to mRCA b.cath 11/2016 s/p DES to mLAD c. Cath 08/2017- patent LAD stent, 40% instent restenosis of mRCA, 99% posterolateral artery s/p PTCA & DES.   Disc displacement, lumbar 05/10/2019   Dyslipidemia 11/17/2016   Dyspnea    Essential (primary) hypertension 03/15/2019   Essential hypertension 08/27/2015   GERD (gastroesophageal reflux disease)    Gout    "on RX qod" (03/02/2018)   Heart attack ([place]) 2002   History of bacterial pneumonia 07/22/2020   History of kidney stones    Hyperlipidemia    Hypertension    Hypogonadism in male 08/27/2015   Kidney stone 08/27/2015   Long-term use of  high-risk medication 08/27/2015   Lumbago with sciatica, right side 01/22/2020   Lumbar post-laminectomy syndrome 06/08/2019   Malaise and fatigue 08/27/2015   Mixed hyperlipidemia 08/27/2015   Non-seasonal allergic rhinitis 07/04/2018   Obstructive sleep apnea 11/17/2016   Pneumonia    Pre-diabetes    Precordial chest pain 11/24/2017   Prediabetes 08/27/2015   Spinal stenosis, 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   Stage 3 chronic kidney disease ([place]) 08/27/2015   Tremor 05/30/2020    Past Surgical History:  Procedure Laterality Date   APPENDECTOMY  1974   BACK SURGERY     CORONARY ANGIOPLASTY WITH STENT PLACEMENT  2002; 11/20/2016   "@ [facility]; @ [place]"   CORONARY CTO INTERVENTION N/A 03/02/2018   Procedure: CORONARY CTO INTERVENTION - Right;  Surgeon: Martinique, Peter M, MD;  Location: [facility];  Service: Cardiovascular;  Laterality: N/A;   CORONARY STENT INTERVENTION N/A 11/20/2016   Procedure: CORONARY STENT INTERVENTION;  Surgeon: Nelva Bush, MD;  Location: [facility];  Service: Cardiovascular;  Laterality: N/A;   CORONARY STENT INTERVENTION N/A 09/09/2017   Procedure: CORONARY STENT INTERVENTION;  Surgeon: Burnell Blanks, MD;  Location: [facility];  Service: Cardiovascular;  Laterality: N/A;   HEMI-MICRODISCECTOMY LUMBAR LAMINECTOMY LEVEL 1 Left 2008   L4   INTRAVASCULAR PRESSURE WIRE/FFR STUDY N/A 09/16/2017   Procedure: INTRAVASCULAR PRESSURE WIRE/FFR STUDY;  Surgeon: Belva Crome, MD;  Location: [facility];  Service: Cardiovascular;  Laterality: N/A;   INTRAVASCULAR ULTRASOUND/IVUS N/A 11/20/2016   Procedure: Intravascular Ultrasound/IVUS;  Surgeon: Nelva Bush, MD;  Location: [facility];  Service: Cardiovascular;  Laterality: N/A;   KNEE ARTHROSCOPY Left 1983   LEFT HEART CATH AND CORONARY ANGIOGRAPHY N/A 11/20/2016   Procedure: LEFT HEART CATH AND CORONARY ANGIOGRAPHY;  Surgeon: Nelva Bush, MD;  Location: [facility];  Service: Cardiovascular;  Laterality: N/A;   LEFT HEART CATH AND CORONARY ANGIOGRAPHY N/A 09/09/2017   Procedure: LEFT HEART CATH AND CORONARY ANGIOGRAPHY;  Surgeon: Burnell Blanks, MD;  Location: [facility];  Service: Cardiovascular;  Laterality: N/A;   LEFT HEART CATH AND CORONARY ANGIOGRAPHY N/A 09/16/2017   Procedure: LEFT HEART CATH AND CORONARY ANGIOGRAPHY;  Surgeon: Belva Crome, MD;  Location: [facility];  Service: Cardiovascular;  Laterality: N/A;   LEFT HEART CATH AND CORONARY ANGIOGRAPHY N/A 02/21/2018   Procedure: LEFT HEART CATH AND CORONARY ANGIOGRAPHY;  Surgeon: Sherren Mocha, MD;  Location: [facility];  Service: Cardiovascular;  Laterality: N/A;   LUMBAR FUSION  12/11/2020   L1-2-3-4   TONSILLECTOMY     ULTRASOUND GUIDANCE FOR VASCULAR ACCESS  03/02/2018   Procedure: Ultrasound Guidance For Vascular Access;  Surgeon: Martinique, Peter M, MD;  Location: [facility];  Service: Cardiovascular;;    Family History  Problem Relation Age of Onset   Diabetes Mother    Cancer Father    Suicidality Brother    Social History:  reports that he quit smoking about 20 years ago. His smoking use included cigarettes. He has a 35.00 pack-year smoking history. He has never used smokeless tobacco. He reports current alcohol use of about 8.0 standard drinks of alcohol per week. He reports that he does not use drugs.  Allergies:  Allergies  Allergen Reactions   Penicillins Anaphylaxis, Swelling and Rash   Hydrocodone Nausea Only    Sick on stomach all day long    Oxycodone Nausea Only    Sick on stomach all day long    Zolpidem Other (See Comments)    dizziness    Medications Prior to Admission  Medication Sig Dispense Refill   allopurinol (ZYLOPRIM) 100 MG tablet Take 100 mg by mouth daily.  3   amLODipine (NORVASC) 10 MG tablet Take 1 tablet (10 mg total) by mouth daily. 90 tablet 2   aspirin EC  81 MG tablet Take 1 tablet (81 mg total) by mouth daily. 90 tablet 3   cyanocobalamin (,VITAMIN B-12,) 1000 MCG/ML injection Inject 1,000 mcg into the muscle every 30 (thirty) days.     diphenhydrAMINE HCl, Sleep, (ZZZQUIL) 50 MG/30ML LIQD Take 50 mg by mouth at bedtime as needed (sleep).     isosorbide mononitrate (IMDUR) 120 MG 24 hr tablet Please take 1.5 tablets daily 135 tablet 2   metoprolol tartrate (LOPRESSOR) 50 MG tablet TAKE 1 AND 1/2 TABLETS BY MOUTH TWICE DAILY (Patient taking differently: Take 100 mg by mouth daily.) 270 tablet 1   ranolazine (RANEXA) 1000 MG SR tablet Take 1 tablet (1,000 mg total) by mouth 2 (two) times daily. 180 tablet 3   rosuvastatin (CRESTOR) 40 MG tablet TAKE ONE TABLET BY MOUTH ONCE DAILY 90 tablet 2   traMADol (ULTRAM) 50 MG tablet Take 50 mg by mouth every 6 (six) hours as needed for moderate pain.     Vitamin D, Ergocalciferol, (DRISDOL) 50000 units CAPS capsule Take 50,000 Units by mouth every Sunday.   3  clopidogrel (PLAVIX) 75 MG tablet TAKE ONE TABLET BY MOUTH ONCE DAILY WITH BREAKFAST Restart on 12/13/20 (Patient not taking: Reported on 11/05/2021) 90 tablet 2   nitroGLYCERIN (NITROSTAT) 0.4 MG SL tablet DISSOLVE 1 TABLET UNDER THE TONGUE EVERY 5 MINUTES AS NEEDED FOR CHEST PAIN. DO NOT EXCEED A TOTAL OF 3 DOSES IN 15 MINUTES. 25 tablet 1   VASCEPA 1 g capsule Take 2 capsules (2 g total) by mouth 2 times daily at 12 noon and 4 pm. (Patient not taking: Reported on 11/05/2021) 120 capsule 2    Results for orders placed or performed during the hospital encounter of 11/11/21 (from the past 48 hour(s))  Basic metabolic panel per protocol     Status: Abnormal   Collection Time: 11/11/21 10:52 AM  Result Value Ref Range   Sodium 140 135 - 145 mmol/L   Potassium 3.6 3.5 - 5.1 mmol/L   Chloride 107 98 - 111 mmol/L   CO2 26 22 - 32 mmol/L   Glucose, Bld 130 (H) 70 - 99 mg/dL    Comment: Glucose reference range applies only to samples taken after fasting for  at least 8 hours.   BUN 10 8 - 23 mg/dL   Creatinine, Ser 1.27 (H) 0.61 - 1.24 mg/dL   Calcium 9.4 8.9 - 10.3 mg/dL   GFR, Estimated >60 >60 mL/min    Comment: (NOTE) Calculated using the CKD-EPI Creatinine Equation (2021)    Anion gap 7 5 - 15    Comment: Performed at [facility] [address], [place], [place] [address]  CBC per protocol     Status: Abnormal   Collection Time: 11/11/21 10:52 AM  Result Value Ref Range   WBC 7.0 4.0 - 10.5 K/uL   RBC 3.72 (L) 4.22 - 5.81 MIL/uL   Hemoglobin 11.9 (L) 13.0 - 17.0 g/dL   HCT 36.0 (L) 39.0 - 52.0 %   MCV 96.8 80.0 - 100.0 fL   MCH 32.0 26.0 - 34.0 pg   MCHC 33.1 30.0 - 36.0 g/dL   RDW 14.6 11.5 - 15.5 %   Platelets 143 (L) 150 - 400 K/uL   nRBC 0.0 0.0 - 0.2 %    Comment: Performed at [facility], [place] [address] [place], [place] [address]  Surgical pcr screen     Status: None   Collection Time: 11/11/21 10:56 AM   Specimen: Nasal Mucosa; Nasal Swab  Result Value Ref Range   MRSA, PCR NEGATIVE NEGATIVE   Staphylococcus aureus NEGATIVE NEGATIVE    Comment: (NOTE) The Xpert SA Assay (FDA approved for NASAL specimens in patients 72 years of age and older), is one component of a comprehensive surveillance program. It is not intended to diagnose infection nor to guide or monitor treatment. Performed at [facility], [place] [address], [place], [place] [address]    No results found.  Review of Systems  Constitutional:  Positive for activity change.  Musculoskeletal:  Positive for back pain, gait problem and myalgias.  Neurological:  Positive for weakness and numbness.  All other systems reviewed and are negative.   Blood pressure 139/85, pulse 80, temperature 97.6 F (36.4 C), temperature source Oral, resp. rate 18, height '5\' 5"'$  (1.651 m), weight 90.7 kg, SpO2 98 %. Physical Exam Constitutional:      Appearance: Normal appearance. He is obese.  HENT:     Head: Normocephalic and atraumatic.     Right  Ear: Tympanic membrane, ear canal and  external ear normal.     Left Ear: Tympanic membrane, ear canal and external ear normal.     Nose: Nose normal.     Mouth/Throat:     Mouth: Mucous membranes are moist.     Pharynx: Oropharynx is clear.  Eyes:     Extraocular Movements: Extraocular movements intact.     Conjunctiva/sclera: Conjunctivae normal.     Pupils: Pupils are equal, round, and reactive to light.  Cardiovascular:     Rate and Rhythm: Normal rate and regular rhythm.     Pulses: Normal pulses.     Heart sounds: Normal heart sounds.  Pulmonary:     Effort: Pulmonary effort is normal.     Breath sounds: Normal breath sounds.  Abdominal:     General: Abdomen is flat. Bowel sounds are normal.     Palpations: Abdomen is soft.  Musculoskeletal:        General: Normal range of motion.     Cervical back: Normal range of motion and neck supple.  Skin:    General: Skin is warm and dry.     Capillary Refill: Capillary refill takes less than 2 seconds.  Neurological:     General: No focal deficit present.     Mental Status: He is alert.  Psychiatric:        Mood and Affect: Mood normal.        Behavior: Behavior normal.        Thought Content: Thought content normal.        Judgment: Judgment normal.      Assessment/Plan Lumbar stenosis L3-L4 with neurogenic claudication, lumbar radiculopathy.  History of arthrodesis L4 to the sacrum.  Plan: Decompression at L3-4 with bilateral laminotomies and foraminotomies.  Earleen Newport, MD 11/11/2021, 2:19 PM

## 2021-11-11 NOTE — Anesthesia Procedure Notes (Signed)
Procedure Name: Intubation Date/Time: 11/11/2021 2:44 PM  Performed by: Lance Coon, CRNAPre-anesthesia Checklist: Patient identified, Emergency Drugs available, Suction available, Patient being monitored and Timeout performed Patient Re-evaluated:Patient Re-evaluated prior to induction Oxygen Delivery Method: Circle system utilized Preoxygenation: Pre-oxygenation with 100% oxygen Induction Type: IV induction Ventilation: Mask ventilation without difficulty and Oral airway inserted - appropriate to patient size Laryngoscope Size: Miller and 3 Grade View: Grade I Tube type: Oral Tube size: 7.5 mm Number of attempts: 1 Airway Equipment and Method: Stylet Placement Confirmation: ETT inserted through vocal cords under direct vision, positive ETCO2 and breath sounds checked- equal and bilateral Secured at: 21 cm Tube secured with: Tape Dental Injury: Teeth and Oropharynx as per pre-operative assessment

## 2021-11-12 ENCOUNTER — Encounter (HOSPITAL_COMMUNITY): Payer: Self-pay | Admitting: Neurological Surgery

## 2021-11-12 DIAGNOSIS — M48062 Spinal stenosis, lumbar region with neurogenic claudication: Secondary | ICD-10-CM | POA: Diagnosis not present

## 2021-11-12 MED ORDER — TRAMADOL HCL 50 MG PO TABS: 50.0000 mg | ORAL_TABLET | Freq: Four times a day (QID) | ORAL | 0 refills | Status: DC | PRN

## 2021-11-12 MED ORDER — METHOCARBAMOL 500 MG PO TABS
500.0000 mg | ORAL_TABLET | Freq: Four times a day (QID) | ORAL | 3 refills | Status: DC | PRN
Start: 2021-11-12 — End: 2023-11-12

## 2021-11-12 NOTE — Progress Notes (Signed)
Patient transported to his vehicle via wheelchair by volunteer for discharge home; in no acute distress nor complaints of pain nor discomfort; moves all extremities well; incision on his back with honeycomb dressing and is clean, dry and intact; room was checked for all his belongings; discharge instructions concerning his medications, incision care, follow up appointment and when to call the doctor as needed were all discussed with patient and his wife by RN and both expressed understanding on the instructions given.

## 2021-11-12 NOTE — Progress Notes (Signed)
PT Cancellation Note and Discharge  Patient Details Name: Robert Ramos MRN: [id number] DOB: 24-May-1950   Cancelled Treatment:    Reason Eval/Treat Not Completed: PT screened, no needs identified, will sign off. Discussed pt case with OT who reports pt is currently mobilizing at a mod I to supervision level and does not require a formal PT evaluation at this time. PT signing off. If needs change, please reconsult.     Thelma Comp 11/12/2021, 10:39 AM  Rolinda Roan, PT, DPT Acute Rehabilitation Services Secure Chat Preferred Office: [phone]

## 2021-11-12 NOTE — Plan of Care (Signed)

## 2021-11-12 NOTE — Discharge Summary (Signed)
Physician Discharge Summary  Patient ID: Robert Ramos MRN: [id number] DOB/AGE: [birth year] 70 y.o.  Admit date: 11/11/2021 Discharge date: 11/12/2021  Admission Diagnoses: Lumbar stenosis L3-L4 history of arthrodesis L4 to sacrum  Discharge Diagnoses: Lumbar stenosis L3-L4 with neurogenic claudication, lumbar radiculopathy.  History of arthrodesis L4 to sacrum. Principal Problem:   Lumbar stenosis with neurogenic claudication   Discharged Condition: good  Hospital Course: Patient was admitted undergo surgical decompression at L3-L4 which he tolerated well.  His incision is clean and dry.  Consults: None  Significant Diagnostic Studies: None  Treatments: surgery: See op note  Discharge Exam: Blood pressure 103/70, pulse 86, temperature 98.2 F (36.8 C), temperature source Oral, resp. rate 18, height '5\' 5"'$  (1.651 m), weight 90.7 kg, SpO2 97 %. Incision is clean and dry Station and gait are intact.  Disposition: Discharge disposition: 01-Home or Self Care       Discharge Instructions     Call MD for:  redness, tenderness, or signs of infection (pain, swelling, redness, odor or green/yellow discharge around incision site)   Complete by: As directed    Call MD for:  severe uncontrolled pain   Complete by: As directed    Call MD for:  temperature >100.4   Complete by: As directed    Diet - low sodium heart healthy   Complete by: As directed    Discharge wound care:   Complete by: As directed    Okay to shower. Do not apply salves or appointments to incision. No heavy lifting with the upper extremities greater than 10 pounds. May resume driving when not requiring pain medication and patient feels comfortable with doing so.   Incentive spirometry RT   Complete by: As directed    Increase activity slowly   Complete by: As directed       Allergies as of 11/12/2021       Reactions   Penicillins Anaphylaxis, Swelling, Rash   Hydrocodone Nausea Only   Sick on stomach  all day long    Oxycodone Nausea Only   Sick on stomach all day long    Zolpidem Other (See Comments)   dizziness        Medication List     TAKE these medications    allopurinol 100 MG tablet Commonly known as: ZYLOPRIM Take 100 mg by mouth daily.   amLODipine 10 MG tablet Commonly known as: NORVASC Take 1 tablet (10 mg total) by mouth daily.   aspirin EC 81 MG tablet Take 1 tablet (81 mg total) by mouth daily.   clopidogrel 75 MG tablet Commonly known as: PLAVIX TAKE ONE TABLET BY MOUTH ONCE DAILY WITH BREAKFAST Restart on 12/13/20   cyanocobalamin 1000 MCG/ML injection Commonly known as: VITAMIN B12 Inject 1,000 mcg into the muscle every 30 (thirty) days.   isosorbide mononitrate 120 MG 24 hr tablet Commonly known as: IMDUR Please take 1.5 tablets daily   methocarbamol 500 MG tablet Commonly known as: ROBAXIN Take 1 tablet (500 mg total) by mouth every 6 (six) hours as needed for muscle spasms.   metoprolol tartrate 50 MG tablet Commonly known as: LOPRESSOR TAKE 1 AND 1/2 TABLETS BY MOUTH TWICE DAILY What changed:  how much to take when to take this   nitroGLYCERIN 0.4 MG SL tablet Commonly known as: NITROSTAT DISSOLVE 1 TABLET UNDER THE TONGUE EVERY 5 MINUTES AS NEEDED FOR CHEST PAIN. DO NOT EXCEED A TOTAL OF 3 DOSES IN 15 MINUTES.   ranolazine 1000 MG SR tablet  Commonly known as: RANEXA Take 1 tablet (1,000 mg total) by mouth 2 (two) times daily.   rosuvastatin 40 MG tablet Commonly known as: CRESTOR TAKE ONE TABLET BY MOUTH ONCE DAILY   traMADol 50 MG tablet Commonly known as: ULTRAM Take 1 tablet (50 mg total) by mouth every 6 (six) hours as needed for moderate pain.   Vascepa 1 g capsule Generic drug: icosapent Ethyl Take 2 capsules (2 g total) by mouth 2 times daily at 12 noon and 4 pm.   Vitamin D (Ergocalciferol) 1.25 MG (50000 UNIT) Caps capsule Commonly known as: DRISDOL Take 50,000 Units by mouth every Sunday.   ZzzQuil 50 MG/30ML  Liqd Generic drug: diphenhydrAMINE HCl (Sleep) Take 50 mg by mouth at bedtime as needed (sleep).               Discharge Care Instructions  (From admission, onward)           Start     Ordered   11/12/21 0000  Discharge wound care:       Comments: Okay to shower. Do not apply salves or appointments to incision. No heavy lifting with the upper extremities greater than 10 pounds. May resume driving when not requiring pain medication and patient feels comfortable with doing so.   11/12/21 0918             Signed: Blanchie Dessert Pearl Berlinger 11/12/2021, 9:19 AM

## 2021-11-12 NOTE — Anesthesia Postprocedure Evaluation (Signed)
Anesthesia Post Note  Patient: Robert Ramos  Procedure(s) Performed: LUMBAR THREE-FOUR SUBLAMINAR DECOMPRESSION (Spine Lumbar)     Patient location during evaluation: PACU Anesthesia Type: General Level of consciousness: awake and alert Pain management: pain level controlled Vital Signs Assessment: post-procedure vital signs reviewed and stable Respiratory status: spontaneous breathing, nonlabored ventilation, respiratory function stable and patient connected to nasal cannula oxygen Cardiovascular status: blood pressure returned to baseline and stable Postop Assessment: no apparent nausea or vomiting Anesthetic complications: no   No notable events documented.  Last Vitals:  Vitals:   11/12/21 0458 11/12/21 0755  BP: 104/65 103/70  Pulse: 86 86  Resp: 18 18  Temp: 36.7 C 36.8 C  SpO2: 96% 97%    Last Pain:  Vitals:   11/12/21 0755  TempSrc: Oral  PainSc:                  Galdino Hinchman

## 2021-11-12 NOTE — Evaluation (Signed)
Occupational Therapy Evaluation/Discharge  Patient Details Name: Robert Ramos MRN: [id number] DOB: [birth year] Today's Date: 11/12/2021   History of Present Illness 71 y.o male s/p L3-4 sublumbar decompression. PMH includes: Arthritis, Gout, HTN, Hyperlipidemia, CAD, previous lumbar surgery.   Clinical Impression   PTA, pt lived with his wife and was independent with ADL, IADL, and driving. Pt recalling 3/3 spinal precautions on arrival. Pt educated and demonstrating use of compensatory techniques for LB dressing, walk in shower transfer, stair progression, and toilet transfer. Pt additionally educated on use of compensatory strategies for car transfer, oral care, and toilet hygiene. Educated pt on positioning and activity at home to decrease muscle soreness/tightness and promote healing process. All education provided, all questions answered. Recommend no OT follow up. OT to sign off. Re-consult if change in status.      Recommendations for follow up therapy are one component of a multi-disciplinary discharge planning process, led by the attending physician.  Recommendations may be updated based on patient status, additional functional criteria and insurance authorization.   Follow Up Recommendations  No OT follow up    Assistance Recommended at Discharge PRN  Patient can return home with the following Assist for transportation;Help with stairs or ramp for entrance;Assistance with cooking/housework;A lot of help with walking and/or transfers;A little help with bathing/dressing/bathroom    Functional Status Assessment     Equipment Recommendations  None recommended by OT    Recommendations for Other Services       Precautions / Restrictions Precautions Precautions: Back Precaution Booklet Issued: Yes (comment) Precaution Comments: Reviewed. Pt verbalizing understanding Required Braces or Orthoses:  (No brace needed orders) Restrictions Weight Bearing Restrictions: No       Mobility Bed Mobility Overal bed mobility: Modified Independent             General bed mobility comments: Pt using log roll technique    Transfers Overall transfer level: Needs assistance Equipment used: None Transfers: Sit to/from Stand Sit to Stand: Supervision           General transfer comment: Supervision for safety with ambulation      Balance Overall balance assessment: Needs assistance Sitting-balance support: No upper extremity supported, Feet supported, Feet unsupported Sitting balance-Leahy Scale: Good Sitting balance - Comments: mod I   Standing balance support: No upper extremity supported, During functional activity Standing balance-Leahy Scale: Fair Standing balance comment: Ambulating without AD with supervision.                           ADL either performed or assessed with clinical judgement   ADL Overall ADL's : Needs assistance/impaired Eating/Feeding: Sitting;Modified independent   Grooming: Standing;Modified independent Grooming Details (indicate cue type and reason): Educated on compensatory techniques for oral care. Upper Body Bathing: Sitting;Modified independent   Lower Body Bathing: Sit to/from stand;Modified independent   Upper Body Dressing : Modified independent;Sitting Upper Body Dressing Details (indicate cue type and reason): Donning gown with mod I Lower Body Dressing: Modified independent;Sit to/from stand Lower Body Dressing Details (indicate cue type and reason): Mod I using compensatory techniques Toilet Transfer: Supervision/safety;Ambulation;Regular Glass blower/designer Details (indicate cue type and reason): Performing simulated toilet transfer with supervision   Toileting - Clothing Manipulation Details (indicate cue type and reason): educated on use of compensatory techniques for posterior peri-care Tub/ Shower Transfer: Walk-in shower;Supervision/safety;Shower Scientist, research (medical) Details  (indicate cue type and reason): Pt demonstrating shower transfer with mod I Functional mobility during  ADLs: Supervision/safety General ADL Comments: Supervision for safety due to slowed and cautious movement during functional mobiltiy tasks. Pt with no LOB, and with good safety awareness. very minimal cues during functional mobility and ADL for adherance to spinal precautions. Advancing up and down 3+ stairs to simulate home environment with supervision. Discussed car transfer     Vision Patient Visual Report: No change from baseline Vision Assessment?: No apparent visual deficits     Perception     Praxis      Pertinent Vitals/Pain Pain Assessment Pain Assessment: Faces Faces Pain Scale: No hurt Pain Location: Pt up and moving around in room on arrival, reoporting to doctor that he "feels great"     Hand Dominance Right   Extremity/Trunk Assessment Upper Extremity Assessment Upper Extremity Assessment: Overall WFL for tasks assessed   Lower Extremity Assessment Lower Extremity Assessment: Generalized weakness   Cervical / Trunk Assessment Cervical / Trunk Assessment: Back Surgery   Communication Communication Communication: No difficulties   Cognition Arousal/Alertness: Awake/alert Behavior During Therapy: WFL for tasks assessed/performed Overall Cognitive Status: Within Functional Limits for tasks assessed                                 General Comments: Recalling 3/3 spinal precautions on arrival     General Comments  VSS. Wife present    Exercises     Shoulder Instructions      Home Living Family/patient expects to be discharged to:: Private residence Living Arrangements: Spouse/significant other Available Help at Discharge: Family;Available 24 hours/day Type of Home: House Home Access: Stairs to enter [employer] of Steps: 2 Entrance Stairs-Rails: Right Home Layout: One level     Bathroom Shower/Tub: Medical illustrator: Standard     Home Equipment: Research scientist (life sciences);Shower seat;Grab bars - tub/shower;Hand held shower head Adaptive Equipment: Tax inspector Additional Comments: Pt reports he has a sturdy stool in shower and would not like OT to recommend 3:1      Prior Functioning/Environment Prior Level of Function : Independent/Modified Independent;Driving             Mobility Comments: Uses a cane on bad days ADLs Comments: Independent in ADL, IADL, and was driving        OT Problem List: Decreased strength;Decreased activity tolerance;Decreased knowledge of precautions;Decreased safety awareness      OT Treatment/Interventions:      OT Goals(Current goals can be found in the care plan section) Acute Rehab OT Goals Patient Stated Goal: Go home OT Goal Formulation: With patient/family Potential to Achieve Goals: Good  OT Frequency:      Co-evaluation              AM-PAC OT "6 Clicks" Daily Activity     Outcome Measure Help from another person eating meals?: None Help from another person taking care of personal grooming?: None Help from another person toileting, which includes using toliet, bedpan, or urinal?: A Little Help from another person bathing (including washing, rinsing, drying)?: A Little Help from another person to put on and taking off regular upper body clothing?: None Help from another person to put on and taking off regular lower body clothing?: A Little 6 Click Score: 21   End of Session Equipment Utilized During Treatment: Gait belt Nurse Communication: Mobility status  Activity Tolerance: Patient tolerated treatment well Patient left: in bed;with call bell/phone within reach  OT Visit Diagnosis: Unsteadiness on  feet (R26.81);Muscle weakness (generalized) (M62.81)                Time: 4996-9249 OT Time Calculation (min): 19 min Charges:  OT General Charges $OT Visit: 1 Visit OT Evaluation $OT Eval Low Complexity: [place], OTR/L [facility] Acute Rehabilitation Office: [phone]   Lula Olszewski 11/12/2021, 10:15 AM

## 2021-11-15 ENCOUNTER — Other Ambulatory Visit: Payer: Self-pay | Admitting: Cardiology

## 2021-11-24 ENCOUNTER — Other Ambulatory Visit: Payer: Self-pay | Admitting: Cardiology

## 2021-12-25 ENCOUNTER — Other Ambulatory Visit: Payer: Self-pay | Admitting: Cardiology

## 2021-12-30 ENCOUNTER — Ambulatory Visit: Payer: PPO | Attending: Cardiology | Admitting: Cardiology

## 2021-12-30 ENCOUNTER — Encounter: Payer: Self-pay | Admitting: Cardiology

## 2021-12-30 VITALS — BP 120/60 | HR 81 | Ht 65.0 in | Wt 189.8 lb

## 2021-12-30 DIAGNOSIS — I10 Essential (primary) hypertension: Secondary | ICD-10-CM

## 2021-12-30 DIAGNOSIS — I25118 Atherosclerotic heart disease of native coronary artery with other forms of angina pectoris: Secondary | ICD-10-CM

## 2021-12-30 DIAGNOSIS — R7303 Prediabetes: Secondary | ICD-10-CM | POA: Diagnosis not present

## 2021-12-30 DIAGNOSIS — E785 Hyperlipidemia, unspecified: Secondary | ICD-10-CM

## 2021-12-30 MED ORDER — ISOSORBIDE MONONITRATE ER 60 MG PO TB24: 60.0000 mg | ORAL_TABLET | Freq: Every day | ORAL | 3 refills | Status: DC

## 2021-12-30 NOTE — Patient Instructions (Addendum)
Medication Instructions:  Your physician has recommended you make the following change in your medication:    START: Imdur '60mg'$  1 tablet daily    Lab Work: None Ordered If you have labs (blood work) drawn today and your tests are completely normal, you will receive your results only by: [place] (if you have MyChart) OR A paper copy in the mail If you have any lab test that is abnormal or we need to change your treatment, we will call you to review the results.   Testing/Procedures: None Ordered   Follow-Up: At [facility], you and your health needs are our priority.  As part of our continuing mission to provide you with exceptional heart care, we have created designated Provider Care Teams.  These Care Teams include your primary Cardiologist (physician) and Advanced Practice Providers (APPs -  Physician Assistants and Nurse Practitioners) who all work together to provide you with the care you need, when you need it.  We recommend signing up for the patient portal called "MyChart".  Sign up information is provided on this After Visit Summary.  MyChart is used to connect with patients for Virtual Visits (Telemedicine).  Patients are able to view lab/test results, encounter notes, upcoming appointments, etc.  Non-urgent messages can be sent to your provider as well.   To learn more about what you can do with MyChart, go to [email or web address].    Your next appointment:   6 month(s)  The format for your next appointment:   In Person  Provider:   Jenne Campus, MD    Other Instructions NA

## 2021-12-30 NOTE — Addendum Note (Signed)
Addended by: Jacobo Forest D on: 12/30/2021 01:26 PM   Modules accepted: Orders

## 2021-12-30 NOTE — Progress Notes (Signed)
Cardiology Office Note:    Date:  12/30/2021   ID:  Robert Ramos, DOB 11-04-1950, MRN [id number]  PCP:  [name], MD  Cardiologist:  Jenne Campus, MD    Referring MD: [name], MD   Chief Complaint  Patient presents with   Follow-up  Doing well  History of Present Illness:    Robert Ramos is a 71 y.o. male    with coronary artery disease status post PCI of the LAD in 2018, status post PCI of the posterior lateral branch of the RCA in May 2019, hypertension, hyperlipidemia.  He has had continued symptoms of exertional chest discomfort and shortness of breath at low level activity despite treatment with multidrug antianginal therapy.  Recent stress testing was abnormal.  He was referred to Dr. Burt Knack for further evaluation.  Cardiac catheterization demonstrated moderate LAD stenosis unchanged from previous study, patent LCx with small vessel disease involving the OM branches not amenable to PCI and interval occlusion of the mid RCA with left-to-right collaterals.  Patient was referred to Dr. Martinique for possible CTO PCI of the RCA.  He underwent attempted CTO PCI of the RCA but this was unsuccessful.  Continued medical therapy was recommended. He is coming today Robert Ramos for follow-up.  He did have 2 back surgery he feels much better he feels good he said for surgery help some second heart block.  Overall happy and optimistic.  He denies have any chest pain tightness squeezing pressure burning chest.  Past Medical History:  Diagnosis Date   Anemia 09/28/2019   Arthritis    "hands, back" (03/02/2018)   ASHD (arteriosclerotic heart disease) 08/27/2015   Body mass index (BMI) 34.0-34.9, adult 03/15/2019   Chronic bilateral low back pain with bilateral sciatica 01/22/2020   Chronic pain of left knee 09/28/2019   Chronic pain syndrome 01/22/2020   Chronic radicular low back pain    Coronary artery disease    a. MI in 2002 s/p stenting to mRCA b.cath 11/2016 s/p DES  to mLAD c. Cath 08/2017- patent LAD stent, 40% instent restenosis of mRCA, 99% posterolateral artery s/p PTCA & DES.   Disc displacement, lumbar 05/10/2019   Dyslipidemia 11/17/2016   Dyspnea    Essential (primary) hypertension 03/15/2019   Essential hypertension 08/27/2015   GERD (gastroesophageal reflux disease)    Gout    "on RX qod" (03/02/2018)   Heart attack ([place]) 2002   History of bacterial pneumonia 07/22/2020   History of kidney stones    Hyperlipidemia    Hypertension    Hypogonadism in male 08/27/2015   Kidney stone 08/27/2015   Long-term use of high-risk medication 08/27/2015   Lumbago with sciatica, right side 01/22/2020   Lumbar post-laminectomy syndrome 06/08/2019   Malaise and fatigue 08/27/2015   Mixed hyperlipidemia 08/27/2015   Non-seasonal allergic rhinitis 07/04/2018   Obstructive sleep apnea 11/17/2016   Pneumonia    Pre-diabetes    Precordial chest pain 11/24/2017   Prediabetes 08/27/2015   Spinal stenosis, 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   Stage 3 chronic kidney disease ([place]) 08/27/2015   Tremor 05/30/2020    Past Surgical History:  Procedure Laterality Date   APPENDECTOMY  1974   BACK SURGERY     CORONARY ANGIOPLASTY WITH STENT PLACEMENT  2002; 11/20/2016   "@ [place]; @ [place]"   CORONARY CTO INTERVENTION N/A 03/02/2018   Procedure: CORONARY CTO INTERVENTION - Right;  Surgeon: Martinique, Peter M, MD;  Location: [place] [facility];  Service: Cardiovascular;  Laterality: N/A;   CORONARY STENT INTERVENTION N/A 11/20/2016   Procedure: CORONARY STENT INTERVENTION;  Surgeon: Nelva Bush, MD;  Location: [place] [facility];  Service: Cardiovascular;  Laterality: N/A;   CORONARY STENT INTERVENTION N/A 09/09/2017   Procedure: CORONARY STENT INTERVENTION;  Surgeon: Burn[place] Blanks, MD;  Location: [place] [facility];  Service: Cardiovascular;  Laterality: N/A;   HEMI-MICRODISCECTOMY LUMBAR LAMINECTOMY LEVEL 1 Left 2008    L4   INTRAVASCULAR PRESSURE WIRE/FFR STUDY N/A 09/16/2017   Procedure: INTRAVASCULAR PRESSURE WIRE/FFR STUDY;  Surgeon: Belva Crome, MD;  Location: [place] [facility];  Service: Cardiovascular;  Laterality: N/A;   INTRAVASCULAR ULTRASOUND/IVUS N/A 11/20/2016   Procedure: Intravascular Ultrasound/IVUS;  Surgeon: Nelva Bush, MD;  Location: [place] [facility];  Service: Cardiovascular;  Laterality: N/A;   KNEE ARTHROSCOPY Left 1983   LEFT HEART CATH AND CORONARY ANGIOGRAPHY N/A 11/20/2016   Procedure: LEFT HEART CATH AND CORONARY ANGIOGRAPHY;  Surgeon: Nelva Bush, MD;  Location: [place] [facility];  Service: Cardiovascular;  Laterality: N/A;   LEFT HEART CATH AND CORONARY ANGIOGRAPHY N/A 09/09/2017   Procedure: LEFT HEART CATH AND CORONARY ANGIOGRAPHY;  Surgeon: [place] Blanks, MD;  Location: [place] [facility];  Service: Cardiovascular;  Laterality: N/A;   LEFT HEART CATH AND CORONARY ANGIOGRAPHY N/A 09/16/2017   Procedure: LEFT HEART CATH AND CORONARY ANGIOGRAPHY;  Surgeon: Belva Crome, MD;  Location: [place] [facility];  Service: Cardiovascular;  Laterality: N/A;   LEFT HEART CATH AND CORONARY ANGIOGRAPHY N/A 02/21/2018   Procedure: LEFT HEART CATH AND CORONARY ANGIOGRAPHY;  Surgeon: Sherren Mocha, MD;  Location: [place] [facility];  Service: Cardiovascular;  Laterality: N/A;   LUMBAR FUSION  12/11/2020   L1-2-3-4   LUMBAR LAMINECTOMY/DECOMPRESSION MICRODISCECTOMY N/A 11/11/2021   Procedure: LUMBAR THREE-FOUR SUBLAMINAR DECOMPRESSION;  Surgeon: Kristeen Miss, MD;  Location: [place];  Service: Neurosurgery;  Laterality: N/A;   TONSILLECTOMY     ULTRASOUND GUIDANCE FOR VASCULAR ACCESS  03/02/2018   Procedure: Ultrasound Guidance For Vascular Access;  Surgeon: Martinique, Peter M, MD;  Location: [place] [facility];  Service: Cardiovascular;;    Current Medications: Current Meds  Medication Sig   allopurinol (ZYLOPRIM) 100 MG tablet Take 100 mg by mouth daily.   amLODipine  (NORVASC) 10 MG tablet Take 1 tablet (10 mg total) by mouth daily.   aspirin EC 81 MG tablet Take 1 tablet (81 mg total) by mouth daily.   clopidogrel (PLAVIX) 75 MG tablet TAKE ONE TABLET BY MOUTH ONCE DAILY WITH BREAKFAST Restart on 12/13/20 (Patient taking differently: Take 75 mg by mouth daily. TAKE ONE TABLET BY MOUTH ONCE DAILY WITH BREAKFAST Restart on 12/13/20)   cyanocobalamin (,VITAMIN B-12,) 1000 MCG/ML injection Inject 1,000 mcg into the muscle every 30 (thirty) days.   diphenhydrAMINE HCl, Sleep, (ZZZQUIL) 50 MG/30ML LIQD Take 50 mg by mouth at bedtime as needed (sleep).   isosorbide mononitrate (IMDUR) 120 MG 24 hr tablet Please take 1.5 tablets daily (Patient taking differently: Take 180 mg by mouth daily. Please take 1.5 tablets daily)   methocarbamol (ROBAXIN) 500 MG tablet Take 1 tablet (500 mg total) by mouth every 6 (six) hours as needed for muscle spasms.   metoprolol tartrate (LOPRESSOR) 50 MG tablet TAKE 1 AND 1/2 TABLETS BY MOUTH TWICE DAILY (Patient taking differently: Take 75 mg by mouth 2 (two) times daily.)   nitroGLYCERIN (NITROSTAT) 0.4 MG SL tablet DISSOLVE 1 TABLET UNDER THE TONGUE EVERY 5  MINUTES AS NEEDED FOR CHEST PAIN. DO NOT EXCEED A TOTAL OF 3 DOSES IN 15 MINUTES. (Patient taking differently: Place 0.4 mg under the tongue every 5 (five) minutes as needed for chest pain.)   ranolazine (RANEXA) 1000 MG SR tablet Take 1 tablet (1,000 mg total) by mouth 2 (two) times daily.   rosuvastatin (CRESTOR) 40 MG tablet TAKE ONE TABLET BY MOUTH ONCE DAILY (Patient taking differently: Take 40 mg by mouth daily.)   traMADol (ULTRAM) 50 MG tablet Take 1 tablet (50 mg total) by mouth every 6 (six) hours as needed for moderate pain.   VASCEPA 1 g capsule Take 2 capsules (2 g total) by mouth 2 times daily at 12 noon and 4 pm. (Patient taking differently: Take 2 g by mouth 2 (two) times daily.)   Vitamin D, Ergocalciferol, (DRISDOL) 50000 units CAPS capsule Take 50,000 Units by mouth  every Sunday.      Allergies:   Penicillins, Hydrocodone, Oxycodone, and Zolpidem   Social History   Socioeconomic History   Marital status: Married    Spouse name: Not on file   Number of children: Not on file   Years of education: Not on file   Highest education level: Not on file  Occupational History   Occupation: retired    Comment: Cabin crew  Tobacco Use   Smoking status: Former    Packs/day: 1.00    Years: 35.00    Total pack years: 35.00    Types: Cigarettes    Quit date: 02/06/2001    Years since quitting: 20.9   Smokeless tobacco: Never  Vaping Use   Vaping Use: Never used  Substance and Sexual Activity   Alcohol use: Yes    Alcohol/week: 8.0 standard drinks of alcohol    Types: 8 Cans of beer per week   Drug use: Never   Sexual activity: Not Currently  Other Topics Concern   Not on file  Social History Narrative   Right handed   Lives at home in a one story home with wife    Retired    Cabin crew    Social Determinants of Health   Financial Resource Strain: Not on file  Food Insecurity: Not on file  Transportation Needs: Not on file  Physical Activity: Not on file  Stress: Not on file  Social Connections: Not on file     Family History: The patient's family history includes Cancer in his father; Diabetes in his mother; Suicidality in his brother. ROS:   Please see the history of present illness.    All 14 point review of systems negative except as described per history of present illness  EKGs/Labs/Other Studies Reviewed:      Recent Labs: 11/11/2021: BUN 10; Creatinine, Ser 1.27; Hemoglobin 11.9; Platelets 143; Potassium 3.6; Sodium 140  Recent Lipid Panel    Component Value Date/Time   CHOL 116 07/25/2019 0813   TRIG 239 (H) 07/25/2019 0813   HDL 35 (L) 07/25/2019 0813   CHOLHDL 3.3 07/25/2019 0813   CHOLHDL 4.3 09/09/2017 0352   VLDL 75 (H) 09/09/2017 0352   LDLCALC 43 07/25/2019 0813   LDLDIRECT 45 07/25/2019 0813     Physical Exam:    VS:  BP 120/60 (BP Location: Left Arm, Patient Position: Sitting)   Pulse 81   Ht '5\' 5"'$  (1.651 m)   Wt 189 lb 12.8 oz (86.1 kg)   SpO2 93%   BMI 31.58 kg/m     Wt Readings from Last 3  Encounters:  12/30/21 189 lb 12.8 oz (86.1 kg)  11/11/21 200 lb (90.7 kg)  07/28/21 194 lb 12.8 oz (88.4 kg)     GEN:  Well nourished, well developed in no acute distress HEENT: Normal NECK: No JVD; No carotid bruits LYMPHATICS: No lymphadenopathy CARDIAC: RRR, no murmurs, no rubs, no gallops RESPIRATORY:  Clear to auscultation without rales, wheezing or rhonchi  ABDOMEN: Soft, non-tender, non-distended MUSCULOSKELETAL:  No edema; No deformity  SKIN: Warm and dry LOWER EXTREMITIES: no swelling NEUROLOGIC:  Alert and oriented x 3 PSYCHIATRIC:  Normal affect   ASSESSMENT:    1. Coronary artery disease of native artery of native heart with stable angina pectoris ([place])   2. Essential hypertension   3. Dyslipidemia   4. Prediabetes    PLAN:    In order of problems listed above:  Coronary disease: Stable from that point review.  On appropriate medications.  I will restart his long-acting nitrate.  He does have completely occluded right coronary artery.  Nitroglycerin prescription will be refilled  Hypertension blood pressure well controlled continue present management. Dyslipidemia: He is on high intensity statin in form of Crestor 40 which I will continue I did review his fasting lipid profile done by primary care physician in March his LDL 65 HDL 53.  The problem is high triglycerides and he is getting better control of his diabetes for that. Prediabetes, last hemoglobin A1c 6.1 he knows that he needs to better than this he has been also followed by primary care physician for that.   Medication Adjustments/Labs and Tests Ordered: Current medicines are reviewed at length with the patient today.  Concerns regarding medicines are outlined above.  No orders of the defined  types were placed in this encounter.  Medication changes: No orders of the defined types were placed in this encounter.   Signed, Park Liter, MD, [facility] 12/30/2021 1:15 PM    [facility]

## 2022-01-20 ENCOUNTER — Ambulatory Visit: Payer: PPO | Admitting: Podiatry

## 2022-01-20 DIAGNOSIS — B353 Tinea pedis: Secondary | ICD-10-CM | POA: Diagnosis not present

## 2022-01-20 DIAGNOSIS — B351 Tinea unguium: Secondary | ICD-10-CM | POA: Diagnosis not present

## 2022-01-20 MED ORDER — TERBINAFINE HCL 1 % EX CREA: 1.0000 | TOPICAL_CREAM | Freq: Two times a day (BID) | CUTANEOUS | 0 refills | Status: DC

## 2022-01-20 MED ORDER — TERBINAFINE HCL 250 MG PO TABS: 250.0000 mg | ORAL_TABLET | Freq: Every day | ORAL | 2 refills | Status: AC

## 2022-01-20 MED ORDER — CICLOPIROX 8 % EX SOLN
Freq: Every day | CUTANEOUS | 0 refills | Status: DC
Start: 2022-01-20 — End: 2023-11-12

## 2022-01-20 NOTE — Progress Notes (Signed)
Subjective:  Patient ID: Desmin Daleo Meraz, male    DOB: [birth year],  MRN: [id number]  Chief Complaint  Patient presents with   Nail Problem    Room 1  Discoloration of 1st toe bilaterally,  possible fungus    Foot Pain    Burning , tingling on both feet , for at least 2 months     71 y.o. male presents with red rash present bilateral foot. Also with nail discoloration and abnormal growth. Does not occasional itching of both feet. Has not yet tried any medications for these issues.   Past Medical History:  Diagnosis Date   Anemia 09/28/2019   Arthritis    "hands, back" (03/02/2018)   ASHD (arteriosclerotic heart disease) 08/27/2015   Body mass index (BMI) 34.0-34.9, adult 03/15/2019   Chronic bilateral low back pain with bilateral sciatica 01/22/2020   Chronic pain of left knee 09/28/2019   Chronic pain syndrome 01/22/2020   Chronic radicular low back pain    Coronary artery disease    a. MI in 2002 s/p stenting to mRCA b.cath 11/2016 s/p DES to mLAD c. Cath 08/2017- patent LAD stent, 40% instent restenosis of mRCA, 99% posterolateral artery s/p PTCA & DES.   Disc displacement, lumbar 05/10/2019   Dyslipidemia 11/17/2016   Dyspnea    Essential (primary) hypertension 03/15/2019   Essential hypertension 08/27/2015   GERD (gastroesophageal reflux disease)    Gout    "on RX qod" (03/02/2018)   Heart attack ([place]) 2002   History of bacterial pneumonia 07/22/2020   History of kidney stones    Hyperlipidemia    Hypertension    Hypogonadism in male 08/27/2015   Kidney stone 08/27/2015   Long-term use of high-risk medication 08/27/2015   Lumbago with sciatica, right side 01/22/2020   Lumbar post-laminectomy syndrome 06/08/2019   Malaise and fatigue 08/27/2015   Mixed hyperlipidemia 08/27/2015   Non-seasonal allergic rhinitis 07/04/2018   Obstructive sleep apnea 11/17/2016   Pneumonia    Pre-diabetes    Precordial chest pain 11/24/2017   Prediabetes 08/27/2015   Spinal  stenosis, 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   Stage 3 chronic kidney disease ([place]) 08/27/2015   Tremor 05/30/2020    Allergies  Allergen Reactions   Penicillins Anaphylaxis, Swelling and Rash   Hydrocodone Nausea Only    Sick on stomach all day long    Oxycodone Nausea Only    Sick on stomach all day long    Zolpidem Other (See Comments)    dizziness    ROS: Negative except as per HPI above  Objective:  General: AAO x3, NAD  Dermatological: Nails with yellow discoloration, ridging, onycholysis and subungual debris.  Bilateral foot with red spotty rash in moccasin distrubution with xerotic peeling skin to the bilateral plantar arch.   Vascular:  Dorsalis Pedis artery and Posterior Tibial artery pedal pulses are 2/4 bilateral.  Capillary fill time < 3 sec to all digits.   Neruologic: Grossly intact via light touch bilateral. Protective threshold intact to all sites bilateral.   Musculoskeletal: No gross boney pedal deformities bilateral. No pain, crepitus, or limitation noted with foot and ankle range of motion bilateral. Muscular strength 5/5 in all groups tested bilateral.  Gait: Unassisted, Nonantalgic.   No images are attached to the encounter.   Assessment:   1. Tinea pedis of both feet   2. Onychomycosis      Plan:  Patient was evaluated and treated and all questions answered.  #tinea pedis bilateral Discussed the  etiology and treatment options for tinea pedis.  Discussed topical and oral treatment.  Recommended topical treatment with 1% terbinafine cream.  This was sent to the patient's pharmacy.  Also discussed appropriate foot hygiene, use of antifungal spray such as Tinactin in shoes, as well as cleaning her foot surfaces such as showers and bathroom floors with bleach.  #Onychomycosis nails x 10 -Educated on etiology of nail fungus. -Baseline liver function studies ordered. Will d/c terbinafine if elevated during therapy. -eRx for oral  terbinafine #30. Educated on risks and benefits of the medication. - eRx for penlac 8% solution once daily applied to all nails.    Return in about 6 weeks (around 03/03/2022) for follow up tinea pedis/ onycho.          [name], DPM [facility]

## 2022-01-21 LAB — HEPATIC FUNCTION PANEL
ALT: 10 IU/L (ref 0–44)
AST: 19 IU/L (ref 0–40)
Albumin: 3.7 g/dL — ABNORMAL LOW (ref 3.9–4.9)
Alkaline Phosphatase: 72 IU/L (ref 44–121)
Bilirubin Total: 0.3 mg/dL (ref 0.0–1.2)
Bilirubin, Direct: 0.13 mg/dL (ref 0.00–0.40)
Total Protein: 6.8 g/dL (ref 6.0–8.5)

## 2022-03-02 ENCOUNTER — Other Ambulatory Visit: Payer: Self-pay | Admitting: Cardiology

## 2022-03-02 NOTE — Telephone Encounter (Signed)
Refill to pharmacy 

## 2022-03-03 ENCOUNTER — Ambulatory Visit: Payer: PPO | Admitting: Podiatry

## 2022-03-03 DIAGNOSIS — B353 Tinea pedis: Secondary | ICD-10-CM

## 2022-03-03 DIAGNOSIS — B351 Tinea unguium: Secondary | ICD-10-CM

## 2022-03-03 MED ORDER — TERBINAFINE HCL 250 MG PO TABS: 250.0000 mg | ORAL_TABLET | Freq: Every day | ORAL | 1 refills | Status: AC

## 2022-03-03 NOTE — Progress Notes (Signed)
Subjective:  Patient ID: Robert Ramos, male    DOB: 01/05/1951,  MRN: [id number]  Chief Complaint  Patient presents with   Follow-up    Nail fungus     71 y.o. male presents for follow-up of bilateral tinea pedis and nail fungal infection.  He reports he has been doing the topical antifungal daily since he last saw me 6 weeks ago.  He took 30 days worth of terbinafine and has reported seeing some improvement in the nails.  Thinks that the tinea pedis fungal infection on his skin on the bottom of the foot has improved significantly has been using antifungal lotion daily.  Past Medical History:  Diagnosis Date   Anemia 09/28/2019   Arthritis    "hands, back" (03/02/2018)   ASHD (arteriosclerotic heart disease) 08/27/2015   Body mass index (BMI) 34.0-34.9, adult 03/15/2019   Chronic bilateral low back pain with bilateral sciatica 01/22/2020   Chronic pain of left knee 09/28/2019   Chronic pain syndrome 01/22/2020   Chronic radicular low back pain    Coronary artery disease    a. MI in 2002 s/p stenting to mRCA b.cath 11/2016 s/p DES to mLAD c. Cath 08/2017- patent LAD stent, 40% instent restenosis of mRCA, 99% posterolateral artery s/p PTCA & DES.   Disc displacement, lumbar 05/10/2019   Dyslipidemia 11/17/2016   Dyspnea    Essential (primary) hypertension 03/15/2019   Essential hypertension 08/27/2015   GERD (gastroesophageal reflux disease)    Gout    "on RX qod" (03/02/2018)   Heart attack ([place]) 2002   History of bacterial pneumonia 07/22/2020   History of kidney stones    Hyperlipidemia    Hypertension    Hypogonadism in male 08/27/2015   Kidney stone 08/27/2015   Long-term use of high-risk medication 08/27/2015   Lumbago with sciatica, right side 01/22/2020   Lumbar post-laminectomy syndrome 06/08/2019   Malaise and fatigue 08/27/2015   Mixed hyperlipidemia 08/27/2015   Non-seasonal allergic rhinitis 07/04/2018   Obstructive sleep apnea 11/17/2016   Pneumonia     Pre-diabetes    Precordial chest pain 11/24/2017   Prediabetes 08/27/2015   Spinal stenosis, 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   Stage 3 chronic kidney disease ([place]) 08/27/2015   Tremor 05/30/2020    Allergies  Allergen Reactions   Penicillins Anaphylaxis, Swelling and Rash   Hydrocodone Nausea Only    Sick on stomach all day long    Oxycodone Nausea Only    Sick on stomach all day long    Zolpidem Other (See Comments)    dizziness    ROS: Negative except as per HPI above  Objective:  General: AAO x3, NAD  Dermatological: Nails with yellow discoloration, ridging, onycholysis and subungual debris though improving at the proximal aspect of the nails with clearing and decreased dystrophy.  The previously seen xerosis and red rashes decreased significantly on the bottoms of both feet since last visit  Vascular:  Dorsalis Pedis artery and Posterior Tibial artery pedal pulses are 2/4 bilateral.  Capillary fill time < 3 sec to all digits.   Neruologic: Grossly intact via light touch bilateral. Protective threshold intact to all sites bilateral.   Musculoskeletal: No gross boney pedal deformities bilateral. No pain, crepitus, or limitation noted with foot and ankle range of motion bilateral. Muscular strength 5/5 in all groups tested bilateral.  Gait: Unassisted, Nonantalgic.   No images are attached to the encounter.   Assessment:   1. Onychomycosis   2. Tinea pedis  of both feet       Plan:  Patient was evaluated and treated and all questions answered.  #tinea pedis bilateral -Overall resolved after use of topical antifungal lotion including terbinafine 1% lotion daily to both feet. -Recommend continued use of this lotion as needed for control of athlete's foot fungal infection.  #Onychomycosis nails x 10 -Educated on etiology of nail fungus. -eRx for oral terbinafine 250 mg daily #60. Educated on risks and benefits of the medication. -Continue  penlac 8% solution once daily applied to all nails. -Patient will follow-up in 2 months for recheck of nail fungus.   Return in about 2 months (around 05/03/2022) for [place] up nail fungal infection.          [name], DPM [facility]

## 2022-05-04 ENCOUNTER — Ambulatory Visit (INDEPENDENT_AMBULATORY_CARE_PROVIDER_SITE_OTHER): Payer: PPO | Admitting: Podiatry

## 2022-05-04 DIAGNOSIS — Z91199 Patient's noncompliance with other medical treatment and regimen due to unspecified reason: Secondary | ICD-10-CM

## 2022-05-04 NOTE — Progress Notes (Signed)
Pt was a no show for apt, charge generated 

## 2022-05-21 DIAGNOSIS — E538 Deficiency of other specified B group vitamins: Secondary | ICD-10-CM | POA: Insufficient documentation

## 2022-05-21 HISTORY — DX: Deficiency of other specified B group vitamins: E53.8

## 2022-06-03 ENCOUNTER — Other Ambulatory Visit: Payer: Self-pay | Admitting: Cardiology

## 2022-07-03 ENCOUNTER — Ambulatory Visit: Payer: PPO | Admitting: Cardiology

## 2022-07-29 ENCOUNTER — Ambulatory Visit: Payer: PPO | Attending: Cardiology | Admitting: Cardiology

## 2022-07-29 ENCOUNTER — Encounter: Payer: Self-pay | Admitting: Cardiology

## 2022-07-29 VITALS — BP 106/64 | HR 71 | Ht 65.0 in | Wt 196.4 lb

## 2022-07-29 DIAGNOSIS — E785 Hyperlipidemia, unspecified: Secondary | ICD-10-CM | POA: Diagnosis not present

## 2022-07-29 DIAGNOSIS — R7303 Prediabetes: Secondary | ICD-10-CM | POA: Diagnosis not present

## 2022-07-29 DIAGNOSIS — I10 Essential (primary) hypertension: Secondary | ICD-10-CM

## 2022-07-29 DIAGNOSIS — I25118 Atherosclerotic heart disease of native coronary artery with other forms of angina pectoris: Secondary | ICD-10-CM

## 2022-07-29 NOTE — Patient Instructions (Signed)

## 2022-07-29 NOTE — Progress Notes (Signed)
Cardiology Office Note:    Date:  07/29/2022   ID:  Robert Ramos, DOB Mar 31, 1951, MRN [id number]  PCP:  Gordan Payment., MD  Cardiologist:  Gypsy Balsam, MD    Referring MD: Gordan Payment., MD   Chief Complaint  Patient presents with   Follow-up  Doing fine  History of Present Illness:    Robert Ramos is a 72 y.o. male     with coronary artery disease status post PCI of the Ramos in 2018, status post PCI of the posterior lateral branch of the RCA in May 2019, hypertension, hyperlipidemia.  He has had continued symptoms of exertional chest discomfort and shortness of breath at low level activity despite treatment with multidrug antianginal therapy.  STress testing was abnormal.  He was referred to Dr. Excell Seltzer for further evaluation.  Cardiac catheterization demonstrated moderate Ramos stenosis unchanged from previous study, patent LCx with small vessel disease involving the OM branches not amenable to PCI and interval occlusion of the mid RCA with left-to-right collaterals.  Patient was referred to Dr. Swaziland for possible CTO PCI of the RCA.  He underwent attempted CTO PCI of the RCA but this was unsuccessful.  Continued medical therapy was recommended.  He comes today to my office for follow-up.  Overall doing very well.  He denies of any chest pain tightness squeezing pressure burning chest no palpitation dizziness swelling of lower extremities.  He is planning to go to her bike week into [facility] and he is looking forward to it.  He is riding bicycles again meaning motorcycles  Past Medical History:  Diagnosis Date   Anemia 09/28/2019   Arthritis    "hands, back" (03/02/2018)   ASHD (arteriosclerotic heart disease) 08/27/2015   B12 deficiency 05/21/2022   Body mass index (BMI) 34.0-34.9, adult 03/15/2019   Chronic bilateral low back pain with bilateral sciatica 01/22/2020   Chronic pain of left knee 09/28/2019   Chronic pain syndrome 01/22/2020   Chronic radicular low  back pain    Coronary artery disease    a. MI in 2002 s/p stenting to mRCA b.cath 11/2016 s/p DES to mLAD c. Cath 08/2017- patent Ramos stent, 40% instent restenosis of mRCA, 99% posterolateral artery s/p PTCA & DES.   Disc displacement, lumbar 05/10/2019   Dyslipidemia 11/17/2016   Dyspnea    Essential (primary) hypertension 03/15/2019   Essential hypertension 08/27/2015   GERD (gastroesophageal reflux disease)    Gout    "on RX qod" (03/02/2018)   Heart attack 2002   History of bacterial pneumonia 07/22/2020   History of kidney stones    Hyperlipidemia    Hypertension    Hypogonadism in male 08/27/2015   Kidney stone 08/27/2015   Long-term use of high-risk medication 08/27/2015   Lumbago with sciatica, right side 01/22/2020   Lumbar post-laminectomy syndrome 06/08/2019   Malaise and fatigue 08/27/2015   Mixed hyperlipidemia 08/27/2015   Non-seasonal allergic rhinitis 07/04/2018   Obstructive sleep apnea 11/17/2016   Pneumonia    Pre-diabetes    Precordial chest pain 11/24/2017   Prediabetes 08/27/2015   Spinal stenosis, 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   Stage 3 chronic kidney disease 08/27/2015   Tremor 05/30/2020    Past Surgical History:  Procedure Laterality Date   APPENDECTOMY  1974   BACK SURGERY     CORONARY ANGIOPLASTY WITH STENT PLACEMENT  2002; 11/20/2016   "@ [facility]; @ [facility]"   CORONARY CTO INTERVENTION N/A 03/02/2018  Procedure: CORONARY CTO INTERVENTION - Right;  Surgeon: Swaziland, Peter M, MD;  Location: [facility] INVASIVE CV LAB;  Service: Cardiovascular;  Laterality: N/A;   CORONARY PRESSURE/FFR STUDY N/A 09/16/2017   Procedure: INTRAVASCULAR PRESSURE WIRE/FFR STUDY;  Surgeon: Lyn Records, MD;  Location: [facility] INVASIVE CV LAB;  Service: Cardiovascular;  Laterality: N/A;   CORONARY STENT INTERVENTION N/A 11/20/2016   Procedure: CORONARY STENT INTERVENTION;  Surgeon: Yvonne Kendall, MD;  Location: [facility] INVASIVE CV LAB;  Service:  Cardiovascular;  Laterality: N/A;   CORONARY STENT INTERVENTION N/A 09/09/2017   Procedure: CORONARY STENT INTERVENTION;  Surgeon: Kathleene Hazel, MD;  Location: [facility] INVASIVE CV LAB;  Service: Cardiovascular;  Laterality: N/A;   CORONARY ULTRASOUND/IVUS N/A 11/20/2016   Procedure: Intravascular Ultrasound/IVUS;  Surgeon: Yvonne Kendall, MD;  Location: [facility] INVASIVE CV LAB;  Service: Cardiovascular;  Laterality: N/A;   HEMI-MICRODISCECTOMY LUMBAR LAMINECTOMY LEVEL 1 Left 2008   L4   KNEE ARTHROSCOPY Left 1983   LEFT HEART CATH AND CORONARY ANGIOGRAPHY N/A 11/20/2016   Procedure: LEFT HEART CATH AND CORONARY ANGIOGRAPHY;  Surgeon: Yvonne Kendall, MD;  Location: [facility] INVASIVE CV LAB;  Service: Cardiovascular;  Laterality: N/A;   LEFT HEART CATH AND CORONARY ANGIOGRAPHY N/A 09/09/2017   Procedure: LEFT HEART CATH AND CORONARY ANGIOGRAPHY;  Surgeon: Kathleene Hazel, MD;  Location: [facility] INVASIVE CV LAB;  Service: Cardiovascular;  Laterality: N/A;   LEFT HEART CATH AND CORONARY ANGIOGRAPHY N/A 09/16/2017   Procedure: LEFT HEART CATH AND CORONARY ANGIOGRAPHY;  Surgeon: Lyn Records, MD;  Location: [facility] INVASIVE CV LAB;  Service: Cardiovascular;  Laterality: N/A;   LEFT HEART CATH AND CORONARY ANGIOGRAPHY N/A 02/21/2018   Procedure: LEFT HEART CATH AND CORONARY ANGIOGRAPHY;  Surgeon: Tonny Bollman, MD;  Location: [facility] INVASIVE CV LAB;  Service: Cardiovascular;  Laterality: N/A;   LUMBAR FUSION  12/11/2020   L1-2-3-4   LUMBAR LAMINECTOMY/DECOMPRESSION MICRODISCECTOMY N/A 11/11/2021   Procedure: LUMBAR THREE-FOUR SUBLAMINAR DECOMPRESSION;  Surgeon: Barnett Abu, MD;  Location: [facility] OR;  Service: Neurosurgery;  Laterality: N/A;   TONSILLECTOMY     ULTRASOUND GUIDANCE FOR VASCULAR ACCESS  03/02/2018   Procedure: Ultrasound Guidance For Vascular Access;  Surgeon: Swaziland, Peter M, MD;  Location: [facility] INVASIVE CV LAB;  Service: Cardiovascular;;    Current Medications: Current Meds  Medication Sig    allopurinol (ZYLOPRIM) 100 MG tablet Take 100 mg by mouth daily.   amLODipine (NORVASC) 10 MG tablet Take 1 tablet (10 mg total) by mouth daily.   aspirin EC 81 MG tablet Take 1 tablet (81 mg total) by mouth daily.   ciclopirox (PENLAC) 8 % solution Apply topically at bedtime. Apply over nail and surrounding skin. Apply daily over previous coat. After seven (7) days, may remove with alcohol and continue cycle. (Patient taking differently: Apply 1 Application topically at bedtime. Apply over nail and surrounding skin. Apply daily over previous coat. After seven (7) days, may remove with alcohol and continue cycle.)   clopidogrel (PLAVIX) 75 MG tablet TAKE ONE TABLET BY MOUTH ONCE DAILY WITH BREAKFAST Restart on 12/13/20 (Patient taking differently: Take 75 mg by mouth daily. TAKE ONE TABLET BY MOUTH ONCE DAILY WITH BREAKFAST Restart on 12/13/20)   cyanocobalamin (,VITAMIN B-12,) 1000 MCG/ML injection Inject 1,000 mcg into the muscle every 30 (thirty) days.   diphenhydrAMINE HCl, Sleep, (ZZZQUIL) 50 MG/30ML LIQD Take 50 mg by mouth at bedtime as needed (sleep).   isosorbide mononitrate (IMDUR) 60 MG 24 hr tablet Take 1 tablet (60 mg total) by mouth  daily.   methocarbamol (ROBAXIN) 500 MG tablet Take 1 tablet (500 mg total) by mouth every 6 (six) hours as needed for muscle spasms.   metoprolol tartrate (LOPRESSOR) 50 MG tablet TAKE 1 AND 1/2 TABLETS BY MOUTH TWICE DAILY (Patient taking differently: Take 75 mg by mouth 2 (two) times daily.)   nitroGLYCERIN (NITROSTAT) 0.4 MG SL tablet DISSOLVE 1 TABLET UNDER THE TONGUE EVERY 5 MINUTES AS NEEDED FOR CHEST PAIN. DO NOT EXCEED A TOTAL OF 3 DOSES IN 15 MINUTES. (Patient taking differently: Place 0.4 mg under the tongue every 5 (five) minutes as needed for chest pain.)   ranolazine (RANEXA) 1000 MG SR tablet Take 1 tablet (1,000 mg total) by mouth 2 (two) times daily.   rosuvastatin (CRESTOR) 40 MG tablet TAKE ONE TABLET BY MOUTH ONCE DAILY (Patient taking  differently: Take 40 mg by mouth daily.)   terbinafine (LAMISIL AT) 1 % cream Apply 1 Application topically 2 (two) times daily.   traMADol (ULTRAM) 50 MG tablet Take 1 tablet (50 mg total) by mouth every 6 (six) hours as needed for moderate pain.   VASCEPA 1 g capsule Take 2 capsules (2 g total) by mouth 2 times daily at 12 noon and 4 pm. (Patient taking differently: Take 2 g by mouth 2 (two) times daily.)   Vitamin D, Ergocalciferol, (DRISDOL) 50000 units CAPS capsule Take 50,000 Units by mouth every Sunday.      Allergies:   Penicillins, Hydrocodone, Oxycodone, and Zolpidem   Social History   Socioeconomic History   Marital status: Married    Spouse name: Not on file   Number of children: Not on file   Years of education: Not on file   Highest education level: Not on file  Occupational History   Occupation: retired    Comment: Journalist, newspaper  Tobacco Use   Smoking status: Former    Packs/day: 1.00    Years: 35.00    Additional pack years: 0.00    Total pack years: 35.00    Types: Cigarettes    Quit date: 02/06/2001    Years since quitting: 21.4   Smokeless tobacco: Never  Vaping Use   Vaping Use: Never used  Substance and Sexual Activity   Alcohol use: Yes    Alcohol/week: 8.0 standard drinks of alcohol    Types: 8 Cans of beer per week   Drug use: Never   Sexual activity: Not Currently  Other Topics Concern   Not on file  Social History Narrative   Right handed   Lives at home in a one story home with wife    Retired    Journalist, newspaper    Social Determinants of Health   Financial Resource Strain: Not on file  Food Insecurity: Not on file  Transportation Needs: Not on file  Physical Activity: Not on file  Stress: Not on file  Social Connections: Not on file     Family History: The patient's family history includes Cancer in his father; Diabetes in his mother; Suicidality in his brother. ROS:   Please see the history of present illness.    All 14 point  review of systems negative except as described per history of present illness  EKGs/Labs/Other Studies Reviewed:      Recent Labs: 11/11/2021: BUN 10; Creatinine, Ser 1.27; Hemoglobin 11.9; Platelets 143; Potassium 3.6; Sodium 140 01/20/2022: ALT 10  Recent Lipid Panel    Component Value Date/Time   CHOL 116 07/25/2019 0813   TRIG 239 (H) 07/25/2019  0813   HDL 35 (L) 07/25/2019 0813   CHOLHDL 3.3 07/25/2019 0813   CHOLHDL 4.3 09/09/2017 0352   VLDL 75 (H) 09/09/2017 0352   LDLCALC 43 07/25/2019 0813   LDLDIRECT 45 07/25/2019 0813    Physical Exam:    VS:  BP 106/64 (BP Location: Left Arm, Patient Position: Sitting)   Pulse 71   Ht 5\' 5"  (1.651 m)   Wt 196 lb 6.4 oz (89.1 kg)   SpO2 94%   BMI 32.68 kg/m     Wt Readings from Last 3 Encounters:  07/29/22 196 lb 6.4 oz (89.1 kg)  12/30/21 189 lb 12.8 oz (86.1 kg)  11/11/21 200 lb (90.7 kg)     GEN:  Well nourished, well developed in no acute distress HEENT: Normal NECK: No JVD; No carotid bruits LYMPHATICS: No lymphadenopathy CARDIAC: RRR, no murmurs, no rubs, no gallops RESPIRATORY:  Clear to auscultation without rales, wheezing or rhonchi  ABDOMEN: Soft, non-tender, non-distended MUSCULOSKELETAL:  No edema; No deformity  SKIN: Warm and dry LOWER EXTREMITIES: no swelling NEUROLOGIC:  Alert and oriented x 3 PSYCHIATRIC:  Normal affect   ASSESSMENT:    1. Coronary artery disease of native artery of native heart with stable angina pectoris   2. Essential hypertension   3. Dyslipidemia   4. Prediabetes    PLAN:    In order of problems listed above:  Coronary disease stable from that point he denies have any chest pain tightness squeezing pressure burning chest.  Will continue dual antiplatelet therapy since he is high risk.  He is tolerating this therapy quite well. Essential hypertension blood pressure well-controlled continue present management. Dyslipidemia LDL 75 this is data from primary care physician.  He  is taking Vascepa as well as Crestor 40 which I will continue. PrediabetesHemoglobin A1c 6.8 he understands the problem trying to be a little more active and watch diet.   Medication Adjustments/Labs and Tests Ordered: Current medicines are reviewed at length with the patient today.  Concerns regarding medicines are outlined above.  No orders of the defined types were placed in this encounter.  Medication changes: No orders of the defined types were placed in this encounter.   Signed, Georgeanna Lea, MD, [facility] 07/29/2022 1:53 PM    [facility]

## 2022-09-02 ENCOUNTER — Other Ambulatory Visit: Payer: Self-pay | Admitting: Cardiology

## 2022-11-27 ENCOUNTER — Other Ambulatory Visit: Payer: Self-pay | Admitting: Cardiology

## 2022-12-31 ENCOUNTER — Telehealth: Payer: Self-pay

## 2022-12-31 NOTE — Telephone Encounter (Signed)
   [facility] Pre-operative Risk Assessment    Request for surgical clearance:  What type of surgery is being performed? : Lumbar Fusion    When is this surgery scheduled? TBD   What type of clearance is required (medical clearance vs. Pharmacy clearance to hold med vs. Both)? Both  Are there any medications that need to be held prior to surgery and how long?Not specified   Practice name and name of physician performing surgery? Dr. Barnett Abu at [facility]   What is your office phone number: [phone]    7.   What is your office fax number: [phone]  8.   Anesthesia type (None, local, MAC, general) ? General    Robert Ramos 12/31/2022, 5:34 PM  _________________________________________________________________   (provider comments below)

## 2023-01-01 NOTE — Telephone Encounter (Signed)
Dr. Bing Matter,  You saw this patient on 07/29/2022. Patient's last cardiac cath was in November 2019 showing CTO of RCA with unsuccessful attempt for CTO PCI. He has remained on DAPT. Per office protocol, given his high risk will you please comment on holding Plavix and Aspirin for lumbar fusion with Dr. Danielle Dess not yet scheduled?  Please route your response to P CV DIV Preop. I will communicate with requesting office once you have given recommendations.   Thank you!  Carlos Levering, NP

## 2023-01-05 NOTE — Telephone Encounter (Signed)
Name: Robert Ramos  DOB: Aug 26, 1950  MRN: [id number]  Primary Cardiologist: Gypsy Balsam, MD   Preoperative team, please contact this patient and set up a phone call appointment for further preoperative risk assessment. Please obtain consent and complete medication review. Thank you for your help.  I confirm that guidance regarding antiplatelet and oral anticoagulation therapy has been completed and, if necessary, noted below.  Per Dr. Bing Matter, "We can hold those medications, antiplatelets therapy for 5 to 7 days."  Hold Plavix and aspirin 5-7 days.      Carlos Levering, NP 01/05/2023, 11:23 AM [facility]

## 2023-01-05 NOTE — Telephone Encounter (Signed)
I left a message for the patient to call our office to schedule a tele visit for pre-op clearance.

## 2023-01-06 ENCOUNTER — Telehealth: Payer: Self-pay | Admitting: Cardiology

## 2023-01-06 ENCOUNTER — Telehealth: Payer: Self-pay | Admitting: *Deleted

## 2023-01-06 ENCOUNTER — Other Ambulatory Visit: Payer: Self-pay

## 2023-01-06 NOTE — Telephone Encounter (Signed)
Patient is returning phone call to schedule a tele visit appointment.

## 2023-01-06 NOTE — Telephone Encounter (Signed)
I s/w the pt and he has been scheduled a tele pre op appt 01/19/23. Med rec and consent are done. In reviewing medications the pt states he thought Dr. Jacky Kindle stopped his Plavix. I reviewed last ov notes 07/2022 from Dr. Bing Matter which reads the pt will continue dual antiplatelet. Ipt states he has not taken Plavix since April appt. Pt says he is going to need a refill as well if he is needing to go back on Plavix. I assured the pt that I will send a message to Dr. Vanetta Shawl nurse to refill the Plavix per notes from MD on 08/08/22.     Patient Consent for Virtual Visit        Eliodoro Dahms Pownall has provided verbal consent on 01/06/2023 for a virtual visit (video or telephone).   CONSENT FOR VIRTUAL VISIT FOR:  Marolyn Hammock Word  By participating in this virtual visit I agree to the following:  I hereby voluntarily request, consent and authorize [facility] and its employed or contracted physicians, physician assistants, nurse practitioners or other licensed health care professionals (the Practitioner), to provide me with telemedicine health care services (the "Services") as deemed necessary by the treating Practitioner. I acknowledge and consent to receive the Services by the Practitioner via telemedicine. I understand that the telemedicine visit will involve communicating with the Practitioner through live audiovisual communication technology and the disclosure of certain medical information by electronic transmission. I acknowledge that I have been given the opportunity to request an in-person assessment or other available alternative prior to the telemedicine visit and am voluntarily participating in the telemedicine visit.  I understand that I have the right to withhold or withdraw my consent to the use of telemedicine in the course of my care at any time, without affecting my right to future care or treatment, and that the Practitioner or I may terminate the telemedicine visit at  any time. I understand that I have the right to inspect all information obtained and/or recorded in the course of the telemedicine visit and may receive copies of available information for a reasonable fee.  I understand that some of the potential risks of receiving the Services via telemedicine include:  Delay or interruption in medical evaluation due to technological equipment failure or disruption; Information transmitted may not be sufficient (e.g. poor resolution of images) to allow for appropriate medical decision making by the Practitioner; and/or  In rare instances, security protocols could fail, causing a breach of personal health information.  Furthermore, I acknowledge that it is my responsibility to provide information about my medical history, conditions and care that is complete and accurate to the best of my ability. I acknowledge that Practitioner's advice, recommendations, and/or decision may be based on factors not within their control, such as incomplete or inaccurate data provided by me or distortions of diagnostic images or specimens that may result from electronic transmissions. I understand that the practice of medicine is not an exact science and that Practitioner makes no warranties or guarantees regarding treatment outcomes. I acknowledge that a copy of this consent can be made available to me via my patient portal [facility] MyChart), or I can request a printed copy by calling the office of [facility].    I understand that my insurance will be billed for this visit.   I have read or had this consent read to me. I understand the contents of this consent, which adequately explains the benefits and risks of the Services being  provided via telemedicine.  I have been provided ample opportunity to ask questions regarding this consent and the Services and have had my questions answered to my satisfaction. I give my informed consent for the services to be provided through  the use of telemedicine in my medical care

## 2023-01-06 NOTE — Telephone Encounter (Signed)
Pt stopped Plavix in April after appt with Dr. Bing Matter. Dr. Bing Matter had told pt to continue Plavix indefinitely. Will refill medication to restart and continue.

## 2023-01-06 NOTE — Telephone Encounter (Addendum)
Robert Ramos K12 minutes ago (1:06 PM)   AF Patient is returning phone call to schedule a tele visit appointment.      Note     I s/w the pt and he has been scheduled a tele pre op appt 01/19/23. Med rec and consent are done. In reviewing medications the pt states he thought Dr. Jacky Kindle stopped his Plavix. I reviewed last ov notes 07/2022 from Dr. Bing Matter which reads the pt will continue dual antiplatelet. Ipt states he has not taken Plavix since April appt. Pt says he is going to need a refill as well if he is needing to go back on Plavix. I assured the pt that I will send a message to Dr. Vanetta Shawl nurse to refill the Plavix per notes from MD on 08/08/22.

## 2023-01-17 NOTE — Progress Notes (Signed)
   Error. No charge. Patient needs to be seen in the office. Not seen in Pre-Op Tele Visit.   Joni Reining DNP, ANP, AACC

## 2023-01-19 ENCOUNTER — Ambulatory Visit: Payer: PPO

## 2023-01-20 ENCOUNTER — Encounter: Payer: Self-pay | Admitting: Podiatry

## 2023-01-20 ENCOUNTER — Ambulatory Visit: Payer: PPO | Admitting: Podiatry

## 2023-01-20 DIAGNOSIS — L600 Ingrowing nail: Secondary | ICD-10-CM | POA: Diagnosis not present

## 2023-01-20 NOTE — Patient Instructions (Signed)

## 2023-01-20 NOTE — Progress Notes (Signed)
Subjective:  Patient ID: Tremond Prada Breed, male    DOB: 06-08-1950,  MRN: [id number]  Marolyn Hammock Aller presents to clinic today for:  Chief Complaint  Patient presents with   Ingrown Toenail    Bilateral great toenails, medial borders > later borders. Ongoing for 5-6 months. No acute infection. Tenderness, mild redness and swelling today. History of MI/CAD, currently on plavix  . Patient presents with pain along both great toenails along the medial nail borders.  Denies any drainage.  He is looking for more permanent solution to stop this from recurring.  PCP is Gordan Payment., MD.  Allergies  Allergen Reactions   Penicillins Anaphylaxis, Swelling and Rash   Hydrocodone Nausea Only    Sick on stomach all day long    Oxycodone Nausea Only    Sick on stomach all day long    Zolpidem Other (See Comments)    dizziness    Review of Systems: Negative except as noted in the HPI.  Objective:  There were no vitals filed for this visit.  Tarell Mcnichol Troop is a pleasant 72 y.o. male in NAD. AAO x 3.  Vascular Examination: Capillary refill time is 3-5 seconds to toes bilateral. Palpable pedal pulses b/l LE. Digital hair present b/l. No pedal edema b/l. Skin temperature gradient WNL b/l. No varicosities b/l. No cyanosis or clubbing noted b/l.   Dermatological Examination: There is incurvation of the bilateral hallux, medial nail border.  There is pain on palpation of the affected nail borders.  No active drainage or erythema is noted today.  Neurological Examination: Epicritic sensation is intact bilateral  Assessment/Plan: 1. Ingrown toenail    Discussed patient's condition today.  After obtaining patient consent, the bilateral hallux was anesthetized with a 50:50 mixture of 1% lidocaine plain and 0.5% bupivacaine plain for a total of 3cc's administered.  Upon confirmation of anesthesia, a freer elevator was utilized to free the bilateral hallux medial nail borders from  the nail bed.  The nail borders were then avulsed proximal to the eponychium and removed in toto.  The area was inspected for any remaining spicules.  A chemical matrixectomy was performed with NaOH and neutralized with acetic acid solution.  Antibiotic ointment and a DSD were applied, followed by a Coban dressing.  Patient tolerated the anesthetic and procedure well and will f/u in 2-3 weeks for recheck.  Patient given post-procedure instructions for daily 15-minute Epsom salt soaks, antibiotic ointment and daily use of Bandaids until toe starts to dry / form eschar.    Return in about 2 weeks (around 02/03/2023) for PNA recheck.   Clerance Lav, DPM, FACFAS [facility]     [address] [place], [place] [address]                Office [phone]  Fax [phone]

## 2023-01-25 ENCOUNTER — Other Ambulatory Visit: Payer: Self-pay | Admitting: Cardiology

## 2023-02-02 ENCOUNTER — Ambulatory Visit: Payer: PPO | Attending: Cardiovascular Disease

## 2023-02-02 DIAGNOSIS — Z01810 Encounter for preprocedural cardiovascular examination: Secondary | ICD-10-CM | POA: Diagnosis not present

## 2023-02-02 NOTE — Progress Notes (Signed)
Virtual Visit via Telephone Note   Because of Robert Ramos's co-morbid illnesses, he is at least at moderate risk for complications without adequate follow up.  This format is felt to be most appropriate for this patient at this time.  The patient did not have access to video technology/had technical difficulties with video requiring transitioning to audio format only (telephone).  All issues noted in this document were discussed and addressed.  No physical exam could be performed with this format.  Please refer to the patient's chart for his consent to telehealth for [facility].  Evaluation Performed:  Preoperative cardiovascular risk assessment _____________   Date:  02/02/2023   Patient ID:  Robert Ramos, DOB [birth year], MRN [id number] Patient Location:  Home Provider location:   Office  Primary Care Provider:  Gordan Payment., MD Primary Cardiologist:  Gypsy Balsam, MD  Chief Complaint / Patient Profile   72 y.o. y/o male with a h/o coronary artery disease, HTN, HLD, prediabetes who is pending lumbar fusion and presents today for telephonic preoperative cardiovascular risk assessment.  History of Present Illness    Robert Ramos is a 72 y.o. male who presents via audio/video conferencing for a telehealth visit today.  Pt was last seen in cardiology clinic on 07/29/2022 by Dr Bing Matter.  At that time Robert Ramos was doing well .  The patient is now pending procedure as outlined above. Since his last visit, he remains stable from a cardiac standpoint.  Today he denies chest pain, shortness of breath, lower extremity edema, fatigue, palpitations, melena, hematuria, hemoptysis, diaphoresis, weakness, presyncope, syncope, orthopnea, and PND.   Past Medical History    Past Medical History:  Diagnosis Date   Anemia 09/28/2019   Arthritis    "hands, back" (03/02/2018)   ASHD (arteriosclerotic heart disease) 08/27/2015   B12 deficiency  05/21/2022   Body mass index (BMI) 34.0-34.9, adult 03/15/2019   Chronic bilateral low back pain with bilateral sciatica 01/22/2020   Chronic pain of left knee 09/28/2019   Chronic pain syndrome 01/22/2020   Chronic radicular low back pain    Coronary artery disease    a. MI in 2002 s/p stenting to mRCA b.cath 11/2016 s/p DES to mLAD c. Cath 08/2017- patent LAD stent, 40% instent restenosis of mRCA, 99% posterolateral artery s/p PTCA & DES.   Disc displacement, lumbar 05/10/2019   Dyslipidemia 11/17/2016   Dyspnea    Essential (primary) hypertension 03/15/2019   Essential hypertension 08/27/2015   GERD (gastroesophageal reflux disease)    Gout    "on RX qod" (03/02/2018)   Heart attack (HCC) 2002   History of bacterial pneumonia 07/22/2020   History of kidney stones    Hyperlipidemia    Hypertension    Hypogonadism in male 08/27/2015   Kidney stone 08/27/2015   Long-term use of high-risk medication 08/27/2015   Lumbago with sciatica, right side 01/22/2020   Lumbar post-laminectomy syndrome 06/08/2019   Malaise and fatigue 08/27/2015   Mixed hyperlipidemia 08/27/2015   Non-seasonal allergic rhinitis 07/04/2018   Obstructive sleep apnea 11/17/2016   Pneumonia    Pre-diabetes    Precordial chest pain 11/24/2017   Prediabetes 08/27/2015   Spinal stenosis, 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   Stage 3 chronic kidney disease (HCC) 08/27/2015   Tremor 05/30/2020   Past Surgical History:  Procedure Laterality Date   APPENDECTOMY  1974   BACK SURGERY     CORONARY ANGIOPLASTY WITH STENT PLACEMENT  2002; 11/20/2016   "@  [facility]; @ [facility]"   CORONARY CTO INTERVENTION N/A 03/02/2018   Procedure: CORONARY CTO INTERVENTION - Right;  Surgeon: Swaziland, Peter M, MD;  Location: [facility] INVASIVE CV LAB;  Service: Cardiovascular;  Laterality: N/A;   CORONARY PRESSURE/FFR STUDY N/A 09/16/2017   Procedure: INTRAVASCULAR PRESSURE WIRE/FFR STUDY;  Surgeon: Lyn Records, MD;   Location: [facility] INVASIVE CV LAB;  Service: Cardiovascular;  Laterality: N/A;   CORONARY STENT INTERVENTION N/A 11/20/2016   Procedure: CORONARY STENT INTERVENTION;  Surgeon: Yvonne Kendall, MD;  Location: [facility] INVASIVE CV LAB;  Service: Cardiovascular;  Laterality: N/A;   CORONARY STENT INTERVENTION N/A 09/09/2017   Procedure: CORONARY STENT INTERVENTION;  Surgeon: Kathleene Hazel, MD;  Location: [facility] INVASIVE CV LAB;  Service: Cardiovascular;  Laterality: N/A;   CORONARY ULTRASOUND/IVUS N/A 11/20/2016   Procedure: Intravascular Ultrasound/IVUS;  Surgeon: Yvonne Kendall, MD;  Location: [facility] INVASIVE CV LAB;  Service: Cardiovascular;  Laterality: N/A;   HEMI-MICRODISCECTOMY LUMBAR LAMINECTOMY LEVEL 1 Left 2008   L4   KNEE ARTHROSCOPY Left 1983   LEFT HEART CATH AND CORONARY ANGIOGRAPHY N/A 11/20/2016   Procedure: LEFT HEART CATH AND CORONARY ANGIOGRAPHY;  Surgeon: Yvonne Kendall, MD;  Location: [facility] INVASIVE CV LAB;  Service: Cardiovascular;  Laterality: N/A;   LEFT HEART CATH AND CORONARY ANGIOGRAPHY N/A 09/09/2017   Procedure: LEFT HEART CATH AND CORONARY ANGIOGRAPHY;  Surgeon: Kathleene Hazel, MD;  Location: [facility] INVASIVE CV LAB;  Service: Cardiovascular;  Laterality: N/A;   LEFT HEART CATH AND CORONARY ANGIOGRAPHY N/A 09/16/2017   Procedure: LEFT HEART CATH AND CORONARY ANGIOGRAPHY;  Surgeon: Lyn Records, MD;  Location: [facility] INVASIVE CV LAB;  Service: Cardiovascular;  Laterality: N/A;   LEFT HEART CATH AND CORONARY ANGIOGRAPHY N/A 02/21/2018   Procedure: LEFT HEART CATH AND CORONARY ANGIOGRAPHY;  Surgeon: Tonny Bollman, MD;  Location: [facility] INVASIVE CV LAB;  Service: Cardiovascular;  Laterality: N/A;   LUMBAR FUSION  12/11/2020   L1-2-3-4   LUMBAR LAMINECTOMY/DECOMPRESSION MICRODISCECTOMY N/A 11/11/2021   Procedure: LUMBAR THREE-FOUR SUBLAMINAR DECOMPRESSION;  Surgeon: Barnett Abu, MD;  Location: [facility] OR;  Service: Neurosurgery;  Laterality: N/A;   TONSILLECTOMY     ULTRASOUND  GUIDANCE FOR VASCULAR ACCESS  03/02/2018   Procedure: Ultrasound Guidance For Vascular Access;  Surgeon: Swaziland, Peter M, MD;  Location: [facility] INVASIVE CV LAB;  Service: Cardiovascular;;    Allergies  Allergies  Allergen Reactions   Penicillins Anaphylaxis, Swelling and Rash   Hydrocodone Nausea Only    Sick on stomach all day long    Oxycodone Nausea Only    Sick on stomach all day long    Zolpidem Other (See Comments)    dizziness    Home Medications    Prior to Admission medications   Medication Sig Start Date End Date Taking? Authorizing Provider  allopurinol (ZYLOPRIM) 100 MG tablet Take 100 mg by mouth daily. 01/20/18   [provider]  amLODipine (NORVASC) 10 MG tablet Take 1 tablet (10 mg total) by mouth daily. 06/03/22   Georgeanna Lea, MD  aspirin EC 81 MG tablet Take 1 tablet (81 mg total) by mouth daily. 11/17/16   Georgeanna Lea, MD  ciclopirox (PENLAC) 8 % solution Apply topically at bedtime. Apply over nail and surrounding skin. Apply daily over previous coat. After seven (7) days, may remove with alcohol and continue cycle. Patient taking differently: Apply 1 Application topically at bedtime. Apply over nail and surrounding skin. Apply daily over previous coat. After seven (7) days, may remove with  alcohol and continue cycle. 01/20/22   Standiford, Jenelle Mages, DPM  clopidogrel (PLAVIX) 75 MG tablet TAKE ONE TABLET BY MOUTH ONCE DAILY WITH BREAKFAST Restart on 12/13/20 Patient not taking: Reported on 01/06/2023 12/13/20   Jadene Pierini, MD  cyanocobalamin (,VITAMIN B-12,) 1000 MCG/ML injection Inject 1,000 mcg into the muscle every 30 (thirty) days. 07/31/21   [provider]  diphenhydrAMINE HCl, Sleep, (ZZZQUIL) 50 MG/30ML LIQD Take 50 mg by mouth at bedtime as needed (sleep).    [provider]  isosorbide mononitrate (IMDUR) 60 MG 24 hr tablet Take 1 tablet (60 mg total) by mouth daily. 01/25/23   Georgeanna Lea, MD   methocarbamol (ROBAXIN) 500 MG tablet Take 1 tablet (500 mg total) by mouth every 6 (six) hours as needed for muscle spasms. 11/12/21   Barnett Abu, MD  metoprolol tartrate (LOPRESSOR) 50 MG tablet TAKE 1 AND 1/2 TABLETS BY MOUTH TWICE DAILY 01/25/23   Georgeanna Lea, MD  nitroGLYCERIN (NITROSTAT) 0.4 MG SL tablet DISSOLVE 1 TABLET UNDER THE TONGUE EVERY 5 MINUTES AS NEEDED FOR CHEST PAIN. DO NOT EXCEED A TOTAL OF 3 DOSES IN 15 MINUTES. Patient not taking: Reported on 01/06/2023 09/02/22   Georgeanna Lea, MD  ranolazine (RANEXA) 1000 MG SR tablet Take 1 tablet (1,000 mg total) by mouth 2 (two) times daily. 11/27/22   Georgeanna Lea, MD  rosuvastatin (CRESTOR) 40 MG tablet TAKE ONE TABLET BY MOUTH ONCE DAILY Patient taking differently: Take 40 mg by mouth daily. 03/02/22   Georgeanna Lea, MD  terbinafine (LAMISIL AT) 1 % cream Apply 1 Application topically 2 (two) times daily. 01/20/22   Standiford, Jenelle Mages, DPM  traMADol (ULTRAM) 50 MG tablet Take 1 tablet (50 mg total) by mouth every 6 (six) hours as needed for moderate pain. 11/12/21   Barnett Abu, MD  VASCEPA 1 g capsule Take 2 capsules (2 g total) by mouth 2 times daily at 12 noon and 4 pm. Patient taking differently: Take 2 g by mouth 2 (two) times daily. 10/08/20   Chrystie Nose, MD  Vitamin D, Ergocalciferol, (DRISDOL) 50000 units CAPS capsule Take 50,000 Units by mouth every Sunday.  08/02/17   [provider]    Physical Exam    Vital Signs:  Robert Ramos does not have vital signs available for review today.  Given telephonic nature of communication, physical exam is limited. AAOx3. NAD. Normal affect.  Speech and respirations are unlabored.  Accessory Clinical Findings    None  Assessment & Plan    1.  Preoperative Cardiovascular Risk Assessment: Lumbar fusion, Dr. Barnett Abu, fax #[phone]      Primary Cardiologist: Gypsy Balsam, MD  Chart reviewed as part of pre-operative  protocol coverage. Given past medical history and time since last visit, based on ACC/AHA guidelines, Robert Ramos would be at acceptable risk for the planned procedure without further cardiovascular testing.  His RCRI is a moderate risk, 6.6% risk of major cardiac event.  He is able to complete greater than 4 METS of physical activity.  His aspirin and Plavix may be held for 5-7 days prior to his surgery.  Please resume as soon as hemostasis is achieved.  Patient was advised that if he/she develops new symptoms prior to surgery to contact our office to arrange a follow-up appointment.  He verbalized understanding.  I will route this recommendation to the requesting party via Epic fax function and remove from pre-op pool.  Time:   Today, I have spent  5 minutes with the patient with telehealth technology discussing medical history, symptoms, and management plan.  Prior to patient's phone evaluation I spent greater than 10 minutes reviewing their past medical history and cardiac medications.    Ronney Asters, NP  02/02/2023, 7:10 AM

## 2023-02-03 ENCOUNTER — Encounter: Payer: Self-pay | Admitting: Podiatry

## 2023-02-03 ENCOUNTER — Ambulatory Visit: Payer: PPO | Admitting: Podiatry

## 2023-02-03 DIAGNOSIS — L600 Ingrowing nail: Secondary | ICD-10-CM | POA: Diagnosis not present

## 2023-02-03 NOTE — Progress Notes (Signed)
Subjective:  Patient ID: Robert Ramos, male    DOB: 1950/10/30,  MRN: [id number]  Chief Complaint  Patient presents with   Wound Check    PNA f/up 10/9. B/l 1st toe medial borders. Doing well. No pain. Scab formation present    Robert Ramos presents to clinic today for f/u of PNA to the bilateral hallux medial nail borders  PCP is Gordan Payment., MD.  Allergies  Allergen Reactions   Penicillins Anaphylaxis, Swelling and Rash   Hydrocodone Nausea Only    Sick on stomach all day long    Oxycodone Nausea Only    Sick on stomach all day long    Zolpidem Other (See Comments)    dizziness    Objective:  Vascular Examination: Capillary refill time is 3-5 seconds to toes bilateral. Palpable pedal pulses b/l LE. Digital hair present b/l. No pedal edema b/l. Skin temperature gradient WNL b/l. No varicosities b/l. No cyanosis or clubbing noted b/l.   Dermatological Examination: Upon inspection of the PNA site, there are no clinical signs of infection.  No purulence, no necrosis, no malodor present.  Minimal to no erythema present due to phenol chemical reaction.  Eschar formed along nail margins.  Minimal to no pain on palpation of area.   Assessment/Plan: 1. Ingrown toenail    Patient may d/c all instructions at this time. Eschar will eventually slough away on its own.  Return if symptoms worsen or fail to improve.   Clerance Lav, DPM, FACFAS [facility]     [address] [place], [place] [address]                Office [phone]  Fax [phone]

## 2023-02-04 ENCOUNTER — Telehealth: Payer: Self-pay | Admitting: Cardiology

## 2023-02-04 NOTE — Telephone Encounter (Signed)
Spoke with spouse and pt. She wanted to know his cardiac diagnosis for medical insurance. Advised he has CAD, ASHD per chart.

## 2023-02-04 NOTE — Telephone Encounter (Signed)
Patient's spouse is calling with questions about patient's heart diagnoses for insurance purposes.   Please advise.

## 2023-02-10 ENCOUNTER — Telehealth: Payer: Self-pay | Admitting: Cardiology

## 2023-02-10 NOTE — Telephone Encounter (Signed)
I called the pt's wife and did assure her that we did fax clearance notes from Edd Fabian, FNP on 02/02/23 @ 9:28 am. I assured the pt's wife that I will re-fax the clearance notes today as well as I will call the surgeon's office.

## 2023-02-10 NOTE — Telephone Encounter (Signed)
Pt's wife calling back from phone note earlier today about clearance. Please advise

## 2023-02-10 NOTE — Telephone Encounter (Signed)
Pt calling to f/u on Clearance. Pt states that office performing procedure has yet to receive anything from our office is to whether pt is cleared or not. Please advise

## 2023-02-10 NOTE — Telephone Encounter (Signed)
  I called the pt's wife and did assure her that we did fax clearance notes from Edd Fabian, FNP on 02/02/23 @ 9:28 am. I assured the pt's wife that I will re-fax the clearance notes today as well as I will call the surgeon's office.

## 2023-02-11 ENCOUNTER — Telehealth: Payer: Self-pay | Admitting: Cardiology

## 2023-02-11 NOTE — Telephone Encounter (Signed)
Called and stated the clearance has been faxed x 2. Office stated received a telephone note.  Stated this is where the clearance is. Office sets up surg clear under tele note.

## 2023-02-11 NOTE — Telephone Encounter (Signed)
Office calling to f/u to see if pt was cleared to have procedure to them not having yet received anything from our office. Please advise

## 2023-02-15 ENCOUNTER — Other Ambulatory Visit: Payer: Self-pay | Admitting: Neurological Surgery

## 2023-02-17 NOTE — Progress Notes (Signed)
Surgical Instructions   Your procedure is scheduled on Tuesday February 23, 2023. Report to [facility] Main Entrance "A" at 8:45 A.M., then check in with the Admitting office. Any questions or running late day of surgery: call [phone]  Questions prior to your surgery date: call [phone], Monday-Friday, 8am-4pm. If you experience any cold or flu symptoms such as cough, fever, chills, shortness of breath, etc. between now and your scheduled surgery, please notify us at the above number.     Remember:  Do not eat or drink after midnight the night before your surgery  Take these medicines the morning of surgery with A SIP OF WATER  allopurinol (ZYLOPRIM)  amLODipine (NORVASC)  isosorbide mononitrate (IMDUR)  metoprolol tartrate (LOPRESSOR)  ranolazine (RANEXA)  rosuvastatin (CRESTOR)   May take these medicines IF NEEDED: methocarbamol (ROBAXIN)  nitroGLYCERIN (NITROSTAT), IF YOU TAKE THIS MEDICATION, PLEASE CALL us AT [phone].   traMADol (ULTRAM)    PER YOUR CARDIOLOGIST, HOLD YOUR aspirin AND clopidogrel (PLAVIX) 5 DAYS PRIOR TO SURGERY, WITH THE LAST DOSE BEING NO LATER THAN 11/02/17/2023.   One week prior to surgery, STOP taking any Aleve, Naproxen, Ibuprofen, Motrin, Advil, Goody's, BC's, all herbal medications, fish oil, and non-prescription vitamins.                     Do NOT Smoke (Tobacco/Vaping) for 24 hours prior to your procedure.  If you use a CPAP at night, you may bring your mask/headgear for your overnight stay.   You will be asked to remove any contacts, glasses, piercing's, hearing aid's, dentures/partials prior to surgery. Please bring cases for these items if needed.    Patients discharged the day of surgery will not be allowed to drive home, and someone needs to stay with them for 24 hours.  SURGICAL WAITING ROOM VISITATION Patients may have no more than 2 support people in the waiting area - these visitors may rotate.   Pre-op nurse will  coordinate an appropriate time for 1 ADULT support person, who may not rotate, to accompany patient in pre-op.  Children under the age of 36 must have an adult with them who is not the patient and must remain in the main waiting area with an adult.  If the patient needs to stay at the hospital during part of their recovery, the visitor guidelines for inpatient rooms apply.  Please refer to the [facility] website for the visitor guidelines for any additional information.   If you received a COVID test during your pre-op visit  it is requested that you wear a mask when out in public, stay away from anyone that may not be feeling well and notify your surgeon if you develop symptoms. If you have been in contact with anyone that has tested positive in the last 10 days please notify you surgeon.      Pre-operative 5 CHG Bathing Instructions   You can play a key role in reducing the risk of infection after surgery. Your skin needs to be as free of germs as possible. You can reduce the number of germs on your skin by washing with CHG (chlorhexidine gluconate) soap before surgery. CHG is an antiseptic soap that kills germs and continues to kill germs even after washing.   DO NOT use if you have an allergy to chlorhexidine/CHG or antibacterial soaps. If your skin becomes reddened or irritated, stop using the CHG and notify one of our RNs at [phone].   Please shower with the  CHG soap starting 4 days before surgery using the following schedule:     Please keep in mind the following:  DO NOT shave, including legs and underarms, starting the day of your first shower.   You may shave your face at any point before/day of surgery.  Place clean sheets on your bed the day you start using CHG soap. Use a clean washcloth (not used since being washed) for each shower. DO NOT sleep with pets once you start using the CHG.   CHG Shower Instructions:  Wash your face and private area with normal soap. If  you choose to wash your hair, wash first with your normal shampoo.  After you use shampoo/soap, rinse your hair and body thoroughly to remove shampoo/soap residue.  Turn the water OFF and apply about 3 tablespoons (45 ml) of CHG soap to a CLEAN washcloth.  Apply CHG soap ONLY FROM YOUR NECK DOWN TO YOUR TOES (washing for 3-5 minutes)  DO NOT use CHG soap on face, private areas, open wounds, or sores.  Pay special attention to the area where your surgery is being performed.  If you are having back surgery, having someone wash your back for you may be helpful. Wait 2 minutes after CHG soap is applied, then you may rinse off the CHG soap.  Pat dry with a clean towel  Put on clean clothes/pajamas   If you choose to wear lotion, please use ONLY the CHG-compatible lotions on the back of this paper.   Additional instructions for the day of surgery: DO NOT APPLY any lotions, deodorants or cologne.   Do not bring valuables to the hospital. [facility] is not responsible for any belongings/valuables. Do not wear jewelry  Put on clean/comfortable clothes.  Please brush your teeth.  Ask your nurse before applying any prescription medications to the skin.     CHG Compatible Lotions   Aveeno Moisturizing lotion  Cetaphil Moisturizing Cream  Cetaphil Moisturizing Lotion  Clairol Herbal Essence Moisturizing Lotion, Dry Skin  Clairol Herbal Essence Moisturizing Lotion, Extra Dry Skin  Clairol Herbal Essence Moisturizing Lotion, Normal Skin  Curel Age Defying Therapeutic Moisturizing Lotion with Alpha Hydroxy  Curel Extreme Care Body Lotion  Curel Soothing Hands Moisturizing Hand Lotion  Curel Therapeutic Moisturizing Cream, Fragrance-Free  Curel Therapeutic Moisturizing Lotion, Fragrance-Free  Curel Therapeutic Moisturizing Lotion, Original Formula  Eucerin Daily Replenishing Lotion  Eucerin Dry Skin Therapy Plus Alpha Hydroxy Crme  Eucerin Dry Skin Therapy Plus Alpha Hydroxy Lotion  Eucerin  Original Crme  Eucerin Original Lotion  Eucerin Plus Crme Eucerin Plus Lotion  Eucerin TriLipid Replenishing Lotion  Keri Anti-Bacterial Hand Lotion  Keri Deep Conditioning Original Lotion Dry Skin Formula Softly Scented  Keri Deep Conditioning Original Lotion, Fragrance Free Sensitive Skin Formula  Keri Lotion Fast Absorbing Fragrance Free Sensitive Skin Formula  Keri Lotion Fast Absorbing Softly Scented Dry Skin Formula  Keri Original Lotion  Keri Skin Renewal Lotion Keri Silky Smooth Lotion  Keri Silky Smooth Sensitive Skin Lotion  Nivea Body Creamy Conditioning Oil  Nivea Body Extra Enriched Lotion  Nivea Body Original Lotion  Nivea Body Sheer Moisturizing Lotion Nivea Crme  Nivea Skin Firming Lotion  NutraDerm 30 Skin Lotion  NutraDerm Skin Lotion  NutraDerm Therapeutic Skin Cream  NutraDerm Therapeutic Skin Lotion  ProShield Protective Hand Cream  Provon moisturizing lotion  Please read over the following fact sheets that you were given.

## 2023-02-18 ENCOUNTER — Encounter (HOSPITAL_COMMUNITY): Payer: Self-pay

## 2023-02-18 ENCOUNTER — Encounter (HOSPITAL_COMMUNITY)
Admission: RE | Admit: 2023-02-18 | Discharge: 2023-02-18 | Disposition: A | Discharge status: Home / Self Care | Payer: No Typology Code available for payment source | Source: Ambulatory Visit | Attending: Neurological Surgery | Admitting: Neurological Surgery

## 2023-02-18 ENCOUNTER — Other Ambulatory Visit: Payer: Self-pay

## 2023-02-18 DIAGNOSIS — Z7902 Long term (current) use of antithrombotics/antiplatelets: Secondary | ICD-10-CM | POA: Insufficient documentation

## 2023-02-18 DIAGNOSIS — E785 Hyperlipidemia, unspecified: Secondary | ICD-10-CM | POA: Diagnosis not present

## 2023-02-18 DIAGNOSIS — K219 Gastro-esophageal reflux disease without esophagitis: Secondary | ICD-10-CM | POA: Insufficient documentation

## 2023-02-18 DIAGNOSIS — Z7982 Long term (current) use of aspirin: Secondary | ICD-10-CM | POA: Insufficient documentation

## 2023-02-18 DIAGNOSIS — Z01818 Encounter for other preprocedural examination: Secondary | ICD-10-CM

## 2023-02-18 DIAGNOSIS — R7303 Prediabetes: Secondary | ICD-10-CM | POA: Diagnosis not present

## 2023-02-18 DIAGNOSIS — I2510 Atherosclerotic heart disease of native coronary artery without angina pectoris: Secondary | ICD-10-CM | POA: Diagnosis not present

## 2023-02-18 DIAGNOSIS — Z955 Presence of coronary angioplasty implant and graft: Secondary | ICD-10-CM | POA: Diagnosis not present

## 2023-02-18 DIAGNOSIS — N183 Chronic kidney disease, stage 3 unspecified: Secondary | ICD-10-CM | POA: Insufficient documentation

## 2023-02-18 DIAGNOSIS — R251 Tremor, unspecified: Secondary | ICD-10-CM | POA: Insufficient documentation

## 2023-02-18 DIAGNOSIS — M48062 Spinal stenosis, lumbar region with neurogenic claudication: Secondary | ICD-10-CM | POA: Insufficient documentation

## 2023-02-18 DIAGNOSIS — Z981 Arthrodesis status: Secondary | ICD-10-CM | POA: Diagnosis not present

## 2023-02-18 DIAGNOSIS — R0609 Other forms of dyspnea: Secondary | ICD-10-CM | POA: Diagnosis not present

## 2023-02-18 DIAGNOSIS — G4733 Obstructive sleep apnea (adult) (pediatric): Secondary | ICD-10-CM | POA: Insufficient documentation

## 2023-02-18 DIAGNOSIS — I129 Hypertensive chronic kidney disease with stage 1 through stage 4 chronic kidney disease, or unspecified chronic kidney disease: Secondary | ICD-10-CM | POA: Diagnosis not present

## 2023-02-18 DIAGNOSIS — Z01812 Encounter for preprocedural laboratory examination: Secondary | ICD-10-CM | POA: Diagnosis not present

## 2023-02-18 DIAGNOSIS — D631 Anemia in chronic kidney disease: Secondary | ICD-10-CM | POA: Diagnosis not present

## 2023-02-18 LAB — TYPE AND SCREEN
ABO/RH(D): O POS
Antibody Screen: NEGATIVE

## 2023-02-18 LAB — SURGICAL PCR SCREEN
MRSA, PCR: NEGATIVE
Staphylococcus aureus: NEGATIVE

## 2023-02-18 LAB — GLUCOSE, CAPILLARY: Glucose-Capillary: 139 mg/dL — ABNORMAL HIGH (ref 70–99)

## 2023-02-18 NOTE — Progress Notes (Signed)
PCP - Dr.Grisso  Cardiologist - Dr.Krasowki  EP-no  Endocrine-no  Pulm-no  Chest x-ray - 05/22/22  EKG - 07/29/22  Stress Test - 08/06/20  ECHO - 06/30/21  Cardiac Cath - 02/21/2018  AICD-no PM-no LOOP-no  Nerve Stimulator-no  Dialysis-no  Sleep Study -"years ago- it was negative" CPAP - no  LABS-CBC, BMP, T/S , PCR  ASA- last  dose 02/17/23   ERAS-no  HA1C-na GLP-1-no Fasting Blood Sugar - 0 Checks Blood Sugar __0___ times a day  Anesthesia- Mr. Applegat was on Plavix, he has not been taking it for "a while".  Patient stated at his last appointment , he told the [name] he had not been taking it, patient said the Dr. said, 'you have been doing well'.  Mr. Applegat said that Plavix  was on after visit summary, (4/24 ), but patient was not given a prescription.  Pt denies having chest pain, sob, or fever at this time. All instructions explained to the pt, with a verbal understanding of the material. Pt agrees to go over the instructions while at home for a better understanding. The opportunity to ask questions was provided.

## 2023-02-19 NOTE — Anesthesia Preprocedure Evaluation (Signed)
Anesthesia Evaluation  Patient identified by MRN, date of birth, ID band Patient awake    Reviewed: Allergy & Precautions, NPO status , Patient's Chart, lab work & pertinent test results, reviewed documented beta blocker date and time   History of Anesthesia Complications (+) PONV and history of anesthetic complications  Airway Mallampati: III  TM Distance: >3 FB     Dental  (+) Edentulous Upper   Pulmonary neg sleep apnea, pneumonia, neg COPD, former smoker, neg PE   breath sounds clear to auscultation       Cardiovascular hypertension, (-) angina (-) CAD, (-) Past MI and (-) Cardiac Stents + Valvular Problems/Murmurs  Rhythm:Regular Rate:Normal     Neuro/Psych neg Seizures  Neuromuscular disease    GI/Hepatic ,GERD  ,,  Endo/Other    Renal/GU Renal disease     Musculoskeletal  (+) Arthritis ,    Abdominal   Peds  Hematology   Anesthesia Other Findings   Reproductive/Obstetrics                             Anesthesia Physical Anesthesia Plan  ASA: 2  Anesthesia Plan: General   Post-op Pain Management: Regional block*   Induction: Intravenous  PONV Risk Score and Plan: 4 or greater and TIVA, Dexamethasone and Ondansetron  Airway Management Planned: Oral ETT  Additional Equipment:   Intra-op Plan:   Post-operative Plan: Extubation in OR  Informed Consent: I have reviewed the patients History and Physical, chart, labs and discussed the procedure including the risks, benefits and alternatives for the proposed anesthesia with the patient or authorized representative who has indicated his/her understanding and acceptance.     Dental advisory given  Plan Discussed with:   Anesthesia Plan Comments: (PAT note written 02/19/2023 by Shonna Chock, PA-C.  )       Anesthesia Quick Evaluation

## 2023-02-19 NOTE — Progress Notes (Signed)
Anesthesia Chart Review:  Case: [id number] Date/Time: 02/23/23 1030   Procedure: XLIF - L2-L3 - Interbody Fusion - C3   Anesthesia type: General   Pre-op diagnosis: SPINAL STENOSIS, LUMBAR REGION WITH NEUROGENIC CLAUDICATION   [facility] ROOM 18 / [facility]   Surgeons: Barnett Abu, MD       DISCUSSION: Patient is a 72 year old male scheduled for the above procedure.  History includes former smoker (quit 02/06/01), HTN, HLD, CAD (BMS dRCA 2002; DES prox/mid LAD 11/20/16; DES PLA 09/09/17; mid RCA 100% with unsuccessful PCI 03/02/18), OSA, CKD, prediabetes, anemia, tremor, GERD, dyspnea, spinal surgery (L4-S1 PLIF 12/06/20, L3-4 laminotomies 11/11/21).    Preoperative cardiology input outlined by Edd Fabian, NP on 02/02/23, "Given past medical history and time since last visit, based on ACC/AHA guidelines, Robert Ramos would be at acceptable risk for the planned procedure without further cardiovascular testing.   His RCRI is a moderate risk, 6.6% risk of major cardiac event.  He is able to complete greater than 4 METS of physical activity.   His aspirin and Plavix may be held for 5-7 days prior to his surgery.  Please resume as soon as hemostasis is achieved...." He reported last ASA 02/17/23 and said he is not currently taking Plavix.    Echo 06/30/21 showed EF 55 to 60%, no regional wall motion abnormalities, mild concentric LVH, grade 1 diastolic dysfunction, normal RV systolic function, normal PASP, mild MR, mild AR. Nuclear stress test 08/06/20 (prior to L4-S1 PLIF) was intermediate risk with medium defect in the inferior regions consistent with ischemia but per Dr. Bing Matter, "Ischemia consistent with her known anatomy with completely occluded right coronary artery." Medical therapy was continued.   Last CMP, A1c, CBC seen are from 01/13/23 in Care Everywhere. CMP and CBC will be > 36 days old, so will need on the day of surgery unless otherwise specified by his assigned anesthesia team.  01/13/23 CMP results were normal other than glucose 130. WBC 10.60, H/H 13.8/40.3, PLT 154K. A1c was 7.2%.   VS: BP 103/70   Pulse (!) 57   Temp 36.7 C   Resp 18   Ht 5\' 5"  (1.651 m)   Wt 86.5 kg   SpO2 99%   BMI 31.73 kg/m    PROVIDERS: Gordan Payment., MD is PCP Gypsy Balsam, MD is cardiologist  LABS: See DISCISSION. (all labs ordered are listed, but only abnormal results are displayed)  Labs Reviewed  GLUCOSE, CAPILLARY - Abnormal; Notable for the following components:      Result Value   Glucose-Capillary 139 (*)    All other components within normal limits  SURGICAL PCR SCREEN  TYPE AND SCREEN    EKG: 07/29/22: NSR   CV: US Carotid 08/05/2021: Summary:  - Right Carotid: The extracranial vessels were near-normal with only minimal  wall thickening or plaque.  - Left Carotid: Velocities in the left ICA are consistent with a 1-39% stenosis.  - Vertebrals: Left vertebral artery demonstrates antegrade flow. Right vertebral artery was not visualized.  - Subclavians: Normal flow hemodynamics were seen in bilateral subclavian arteries.    Echo 06/30/2021: IMPRESSIONS   1. Left ventricular ejection fraction, by estimation, is 55 to 60%. The  left ventricle has normal function. The left ventricle has no regional  wall motion abnormalities. There is mild concentric left ventricular  hypertrophy. Left ventricular diastolic  parameters are consistent with Grade I diastolic dysfunction (impaired  relaxation).   2. Right ventricular systolic function is normal.  The right ventricular  size is normal. There is normal pulmonary artery systolic pressure.   3. The mitral valve is normal in structure. Mild mitral valve  regurgitation. No evidence of mitral stenosis.   4. The aortic valve is tricuspid. Aortic valve regurgitation is mild.  Aortic valve sclerosis is present, with no evidence of aortic valve  stenosis.   5. Aortic Normal DTA.   6. The inferior vena cava is  normal in size with greater than 50%  respiratory variability, suggesting right atrial pressure of 3 mmHg.    Nuclear stress 08/06/2020: The left ventricular ejection fraction is normal (55-65%). Nuclear stress EF: 62%. There was no ST segment deviation noted during stress. Defect 1: There is a medium defect of mild severity present in the mid inferior, apical inferior and apical lateral location. Findings consistent with ischemia. This is an intermediate risk study.     EVENT MONITOR REPORT: 03/29/2018-04/27/2018:  Baseline rhythm: Sinus Atrial arrhythmia: None Ventricular arrhythmia: None Conduction abnormality: None Symptoms: 1 symptomatic event which shows sinus rhythm Conclusion:  Only one triggered event showing normal sinus rhythm   Cardiac cath 03/02/2018: Non-stenotic Post Atrio lesion was previously treated. Mid RCA-2 lesion is 100% stenosed. Mid RCA to Dist RCA lesion is 100% stenosed. Post PCI there remains 100% occlusion   1. Unsuccessful CTO PCI of the RCA. Unable to cross lesion with a wire.    Plan: would continue medical therapy. I don't feel further attempts at CTO PCI would be advisable.    Past Medical History:  Diagnosis Date   Anemia 09/28/2019   Arthritis    "hands, back" (03/02/2018)   ASHD (arteriosclerotic heart disease) 08/27/2015   B12 deficiency 05/21/2022   Body mass index (BMI) 34.0-34.9, adult 03/15/2019   Chronic bilateral low back pain with bilateral sciatica 01/22/2020   Chronic pain of left knee 09/28/2019   Chronic pain syndrome 01/22/2020   Chronic radicular low back pain    Coronary artery disease    a. MI in 2002 s/p stenting to mRCA b.cath 11/2016 s/p DES to mLAD c. Cath 08/2017- patent LAD stent, 40% instent restenosis of mRCA, 99% posterolateral artery s/p PTCA & DES.   Disc displacement, lumbar 05/10/2019   Dyslipidemia 11/17/2016   Dyspnea    Essential (primary) hypertension 03/15/2019   Essential hypertension 08/27/2015    GERD (gastroesophageal reflux disease)    Gout    "on RX qod" (03/02/2018)   Heart attack (HCC) 2002   History of bacterial pneumonia 07/22/2020   History of kidney stones    Hyperlipidemia    Hypertension    Hypogonadism in male 08/27/2015   Kidney stone 08/27/2015   Long-term use of high-risk medication 08/27/2015   Lumbago with sciatica, right side 01/22/2020   Lumbar post-laminectomy syndrome 06/08/2019   Malaise and fatigue 08/27/2015   Mixed hyperlipidemia 08/27/2015   Non-seasonal allergic rhinitis 07/04/2018   Obstructive sleep apnea 11/17/2016   Pneumonia    Pre-diabetes    Precordial chest pain 11/24/2017   Prediabetes 08/27/2015   Spinal stenosis, 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   Stage 3 chronic kidney disease (HCC) 08/27/2015   Patient unaware.  Creatine on 11/2021 was 1.27.   Tremor 05/30/2020    Past Surgical History:  Procedure Laterality Date   APPENDECTOMY  1974   BACK SURGERY     CORONARY ANGIOPLASTY WITH STENT PLACEMENT  2002; 11/20/2016   "@ [facility]; @ [facility]"   CORONARY CTO INTERVENTION N/A 03/02/2018  Procedure: CORONARY CTO INTERVENTION - Right;  Surgeon: Swaziland, Peter M, MD;  Location: [facility] INVASIVE CV LAB;  Service: Cardiovascular;  Laterality: N/A;   CORONARY PRESSURE/FFR STUDY N/A 09/16/2017   Procedure: INTRAVASCULAR PRESSURE WIRE/FFR STUDY;  Surgeon: Lyn Records, MD;  Location: [facility] INVASIVE CV LAB;  Service: Cardiovascular;  Laterality: N/A;   CORONARY STENT INTERVENTION N/A 11/20/2016   Procedure: CORONARY STENT INTERVENTION;  Surgeon: Yvonne Kendall, MD;  Location: [facility] INVASIVE CV LAB;  Service: Cardiovascular;  Laterality: N/A;   CORONARY STENT INTERVENTION N/A 09/09/2017   Procedure: CORONARY STENT INTERVENTION;  Surgeon: Kathleene Hazel, MD;  Location: [facility] INVASIVE CV LAB;  Service: Cardiovascular;  Laterality: N/A;   CORONARY ULTRASOUND/IVUS N/A 11/20/2016   Procedure: Intravascular Ultrasound/IVUS;   Surgeon: Yvonne Kendall, MD;  Location: [facility] INVASIVE CV LAB;  Service: Cardiovascular;  Laterality: N/A;   HEMI-MICRODISCECTOMY LUMBAR LAMINECTOMY LEVEL 1 Left 2008   L4   KNEE ARTHROSCOPY Left 1983   LEFT HEART CATH AND CORONARY ANGIOGRAPHY N/A 11/20/2016   Procedure: LEFT HEART CATH AND CORONARY ANGIOGRAPHY;  Surgeon: Yvonne Kendall, MD;  [facility] INVAS[facility] CV LAB;  Service: Cardiovascular;  Laterality: N/A;   LEFT HEART CATH AND CORONARY ANGIOGRAPHY N/A 09/09/2017   Procedure: LEFT HEART CATH AND CORONARY ANGIOGRAPHY;  Surgeon: Kathleene Hazel, MD;  [facility] INVAS[facility] CV LAB;  Service: Cardiovascular;  Laterality: N/A;   LEFT HEART CATH AND CORONARY ANGIOGRAPHY N/A 09/16/2017   Procedure: LEFT HEART CATH AND CORONARY ANGIOGRAPHY;  Surgeon: Lyn Records, MD;  [facility];  Service: Cardiovascular;  Laterality: N/A;   LEFT HEART CATH AND CORONARY ANGIOGRAPHY N/A 02/21/2018   Procedure: LEFT HEART CATH AND CORONARY ANGIOGRAPHY;  Surgeon: Tonny Bollman, MD;  Location: [facility] INVASIVE CV LAB;  Service: Cardiovascular;  Laterality: N/A;   LUMBAR FUSION  12/11/2020   L1-2-3-4   LUMBAR LAMINECTOMY/DECOMPRESSION MICRODISCECTOMY N/A 11/11/2021   Procedure: LUMBAR THREE-FOUR SUBLAMINAR DECOMPRESSION;  Surgeon: Barnett Abu, MD;  Location: [facility] OR;  Service: Neurosurgery;  Laterality: N/A;   TONSILLECTOMY     ULTRASOUND GUIDANCE FOR VASCULAR ACCESS  03/02/2018   Procedure: Ultrasound Guidance For Vascular Access;  Surgeon: Swaziland, Peter M, MD;  Location: [facility] INVASIVE CV LAB;  Service: Cardiovascular;;    MEDICATIONS:  aspirin EC 81 MG tablet   allopurinol (ZYLOPRIM) 100 MG tablet   amLODipine (NORVASC) 10 MG tablet   ciclopirox (PENLAC) 8 % solution   clopidogrel (PLAVIX) 75 MG tablet   cyanocobalamin (,VITAMIN B-12,) 1000 MCG/ML injection   diphenhydrAMINE HCl, Sleep, (ZZZQUIL) 50 MG/30ML LIQD   isosorbide mononitrate (IMDUR) 60 MG 24 hr tablet   methocarbamol  (ROBAXIN) 500 MG tablet   metoprolol tartrate (LOPRESSOR) 50 MG tablet   nitroGLYCERIN (NITROSTAT) 0.4 MG SL tablet   ranolazine (RANEXA) 1000 MG SR tablet   rosuvastatin (CRESTOR) 40 MG tablet   terbinafine (LAMISIL AT) 1 % cream   traMADol (ULTRAM) 50 MG tablet   VASCEPA 1 g capsule   Vitamin D, Ergocalciferol, (DRISDOL) 50000 units CAPS capsule   No current facility-administered medications for this encounter.    Shonna Chock, PA-C Surgical Short Stay/Anesthesiology [facility] Phone [phone] [facility] Phone [phone] 02/19/2023 2:35 PM

## 2023-02-22 ENCOUNTER — Other Ambulatory Visit: Payer: Self-pay | Admitting: Neurological Surgery

## 2023-02-22 NOTE — Progress Notes (Signed)
Spoke with Pt's wife. Pt will arrive tom at 0740. NPO post midnight.

## 2023-02-23 ENCOUNTER — Encounter (HOSPITAL_COMMUNITY)
Admission: RE | Disposition: A | Discharge status: Home / Self Care | Payer: Self-pay | Source: Home / Self Care | Attending: Neurological Surgery

## 2023-02-23 ENCOUNTER — Other Ambulatory Visit: Payer: Self-pay

## 2023-02-23 ENCOUNTER — Inpatient Hospital Stay (HOSPITAL_COMMUNITY): Payer: Worker's Compensation

## 2023-02-23 ENCOUNTER — Inpatient Hospital Stay (HOSPITAL_COMMUNITY)
Admission: RE | Admit: 2023-02-23 | Discharge: 2023-02-24 | DRG: 451 | Disposition: A | Discharge status: Home / Self Care | Payer: Worker's Compensation | Attending: Neurological Surgery | Admitting: Neurological Surgery

## 2023-02-23 ENCOUNTER — Inpatient Hospital Stay (HOSPITAL_COMMUNITY): Payer: PPO

## 2023-02-23 ENCOUNTER — Encounter (HOSPITAL_COMMUNITY): Payer: Self-pay | Admitting: Neurological Surgery

## 2023-02-23 ENCOUNTER — Inpatient Hospital Stay (HOSPITAL_COMMUNITY): Payer: Self-pay | Admitting: Vascular Surgery

## 2023-02-23 DIAGNOSIS — G894 Chronic pain syndrome: Secondary | ICD-10-CM | POA: Diagnosis present

## 2023-02-23 DIAGNOSIS — I252 Old myocardial infarction: Secondary | ICD-10-CM | POA: Diagnosis not present

## 2023-02-23 DIAGNOSIS — I129 Hypertensive chronic kidney disease with stage 1 through stage 4 chronic kidney disease, or unspecified chronic kidney disease: Secondary | ICD-10-CM | POA: Diagnosis present

## 2023-02-23 DIAGNOSIS — Z8701 Personal history of pneumonia (recurrent): Secondary | ICD-10-CM

## 2023-02-23 DIAGNOSIS — Z87442 Personal history of urinary calculi: Secondary | ICD-10-CM | POA: Diagnosis not present

## 2023-02-23 DIAGNOSIS — G4733 Obstructive sleep apnea (adult) (pediatric): Secondary | ICD-10-CM | POA: Diagnosis present

## 2023-02-23 DIAGNOSIS — Z880 Allergy status to penicillin: Secondary | ICD-10-CM | POA: Diagnosis not present

## 2023-02-23 DIAGNOSIS — N183 Chronic kidney disease, stage 3 unspecified: Secondary | ICD-10-CM | POA: Diagnosis present

## 2023-02-23 DIAGNOSIS — Z833 Family history of diabetes mellitus: Secondary | ICD-10-CM

## 2023-02-23 DIAGNOSIS — Z7902 Long term (current) use of antithrombotics/antiplatelets: Secondary | ICD-10-CM

## 2023-02-23 DIAGNOSIS — M48062 Spinal stenosis, lumbar region with neurogenic claudication: Secondary | ICD-10-CM

## 2023-02-23 DIAGNOSIS — Z87891 Personal history of nicotine dependence: Secondary | ICD-10-CM

## 2023-02-23 DIAGNOSIS — Z885 Allergy status to narcotic agent status: Secondary | ICD-10-CM

## 2023-02-23 DIAGNOSIS — Z981 Arthrodesis status: Secondary | ICD-10-CM | POA: Diagnosis not present

## 2023-02-23 DIAGNOSIS — Z955 Presence of coronary angioplasty implant and graft: Secondary | ICD-10-CM

## 2023-02-23 DIAGNOSIS — M4726 Other spondylosis with radiculopathy, lumbar region: Secondary | ICD-10-CM | POA: Diagnosis present

## 2023-02-23 DIAGNOSIS — E782 Mixed hyperlipidemia: Secondary | ICD-10-CM | POA: Diagnosis present

## 2023-02-23 DIAGNOSIS — K219 Gastro-esophageal reflux disease without esophagitis: Secondary | ICD-10-CM | POA: Diagnosis present

## 2023-02-23 DIAGNOSIS — Z79899 Other long term (current) drug therapy: Secondary | ICD-10-CM

## 2023-02-23 DIAGNOSIS — Z01818 Encounter for other preprocedural examination: Secondary | ICD-10-CM

## 2023-02-23 DIAGNOSIS — I2510 Atherosclerotic heart disease of native coronary artery without angina pectoris: Secondary | ICD-10-CM | POA: Diagnosis present

## 2023-02-23 DIAGNOSIS — Z87892 Personal history of anaphylaxis: Secondary | ICD-10-CM | POA: Diagnosis not present

## 2023-02-23 DIAGNOSIS — Z7982 Long term (current) use of aspirin: Secondary | ICD-10-CM

## 2023-02-23 DIAGNOSIS — Z888 Allergy status to other drugs, medicaments and biological substances status: Secondary | ICD-10-CM | POA: Diagnosis not present

## 2023-02-23 HISTORY — PX: ANTERIOR LAT LUMBAR FUSION: SHX1168

## 2023-02-23 LAB — CBC
HCT: 40.4 % (ref 39.0–52.0)
HCT: 38 % — ABNORMAL LOW (ref 39.0–52.0)
Hemoglobin: 13.3 g/dL (ref 13.0–17.0)
Hemoglobin: 12.3 g/dL — ABNORMAL LOW (ref 13.0–17.0)
MCH: 32 pg (ref 26.0–34.0)
MCH: 31.7 pg (ref 26.0–34.0)
MCHC: 32.9 g/dL (ref 30.0–36.0)
MCHC: 32.4 g/dL (ref 30.0–36.0)
MCV: 97.1 fL (ref 80.0–100.0)
MCV: 97.9 fL (ref 80.0–100.0)
Platelets: 129 10*3/uL — ABNORMAL LOW (ref 150–400)
Platelets: 115 10*3/uL — ABNORMAL LOW (ref 150–400)
RBC: 4.16 MIL/uL — ABNORMAL LOW (ref 4.22–5.81)
RBC: 3.88 MIL/uL — ABNORMAL LOW (ref 4.22–5.81)
RDW: 14.3 % (ref 11.5–15.5)
RDW: 14.6 % (ref 11.5–15.5)
WBC: 11.1 10*3/uL — ABNORMAL HIGH (ref 4.0–10.5)
WBC: 8.5 10*3/uL (ref 4.0–10.5)
nRBC: 0 % (ref 0.0–0.2)
nRBC: 0 % (ref 0.0–0.2)

## 2023-02-23 LAB — BASIC METABOLIC PANEL
Anion gap: 11 (ref 5–15)
BUN: 13 mg/dL (ref 8–23)
CO2: 25 mmol/L (ref 22–32)
Calcium: 9.9 mg/dL (ref 8.9–10.3)
Chloride: 104 mmol/L (ref 98–111)
Creatinine, Ser: 1.06 mg/dL (ref 0.61–1.24)
GFR, Estimated: >60 mL/min (ref >60)
Glucose, Bld: 100 mg/dL — ABNORMAL HIGH (ref 70–99)
Potassium: 3.8 mmol/L (ref 3.5–5.1)
Sodium: 140 mmol/L (ref 135–145)

## 2023-02-23 LAB — CREATININE, SERUM
Creatinine, Ser: 1.22 mg/dL (ref 0.61–1.24)
GFR, Estimated: >60 mL/min (ref >60)

## 2023-02-23 SURGERY — ANTERIOR LATERAL LUMBAR FUSION 1 LEVEL
Anesthesia: General

## 2023-02-23 MED ORDER — MIDAZOLAM HCL 2 MG/2ML IJ SOLN
INTRAMUSCULAR | Status: AC
Start: 2023-02-23 — End: ?
  Filled 2023-02-23: qty 2

## 2023-02-23 MED ORDER — EPHEDRINE 5 MG/ML INJ
INTRAVENOUS | Status: AC
  Filled 2023-02-23: qty 5

## 2023-02-23 MED ORDER — FENTANYL CITRATE (PF) 100 MCG/2ML IJ SOLN
25.0000 ug | INTRAMUSCULAR | Status: DC | PRN
  Administered 2023-02-23 (×2): 50 ug via INTRAVENOUS

## 2023-02-23 MED ORDER — CELECOXIB 200 MG PO CAPS
200.0000 mg | ORAL_CAPSULE | Freq: Once | ORAL | Status: AC
  Administered 2023-02-23: 200 mg via ORAL
  Filled 2023-02-23: qty 1

## 2023-02-23 MED ORDER — SENNA 8.6 MG PO TABS
1.0000 | ORAL_TABLET | Freq: Two times a day (BID) | ORAL | Status: DC
  Administered 2023-02-23 – 2023-02-24 (×2): 8.6 mg via ORAL
  Filled 2023-02-23 (×2): qty 1

## 2023-02-23 MED ORDER — FLEET ENEMA RE ENEM: 1.0000 | ENEMA | Freq: Once | RECTAL | Status: DC | PRN

## 2023-02-23 MED ORDER — HYDROMORPHONE HCL 2 MG PO TABS
2.0000 mg | ORAL_TABLET | ORAL | Status: DC | PRN
  Administered 2023-02-23 – 2023-02-24 (×4): 2 mg via ORAL
  Filled 2023-02-23 (×4): qty 1

## 2023-02-23 MED ORDER — METHOCARBAMOL 1000 MG/10ML IJ SOLN: 500.0000 mg | Freq: Four times a day (QID) | INTRAMUSCULAR | Status: DC | PRN

## 2023-02-23 MED ORDER — DEXAMETHASONE SODIUM PHOSPHATE 10 MG/ML IJ SOLN
INTRAMUSCULAR | Status: AC
  Filled 2023-02-23: qty 1

## 2023-02-23 MED ORDER — PROPOFOL 10 MG/ML IV BOLUS
INTRAVENOUS | Status: DC | PRN
  Administered 2023-02-23: 150 mg via INTRAVENOUS

## 2023-02-23 MED ORDER — DIPHENHYDRAMINE HCL 50 MG/ML IJ SOLN
INTRAMUSCULAR | Status: AC
  Filled 2023-02-23: qty 1

## 2023-02-23 MED ORDER — CHLORHEXIDINE GLUCONATE CLOTH 2 % EX PADS
6.0000 | MEDICATED_PAD | Freq: Once | CUTANEOUS | Status: DC
Start: 2023-02-23 — End: 2023-02-23

## 2023-02-23 MED ORDER — RANOLAZINE ER 500 MG PO TB12
1000.0000 mg | ORAL_TABLET | Freq: Two times a day (BID) | ORAL | Status: DC
  Administered 2023-02-23 – 2023-02-24 (×2): 1000 mg via ORAL
  Filled 2023-02-23 (×2): qty 2

## 2023-02-23 MED ORDER — THROMBIN 5000 UNITS EX SOLR
OROMUCOSAL | Status: DC | PRN
  Administered 2023-02-23: 1 mL via TOPICAL

## 2023-02-23 MED ORDER — ACETAMINOPHEN 500 MG PO TABS
1000.0000 mg | ORAL_TABLET | Freq: Once | ORAL | Status: AC
  Administered 2023-02-23: 1000 mg via ORAL
  Filled 2023-02-23: qty 2

## 2023-02-23 MED ORDER — METHOCARBAMOL 500 MG PO TABS
500.0000 mg | ORAL_TABLET | Freq: Four times a day (QID) | ORAL | Status: DC | PRN
  Filled 2023-02-23: qty 1

## 2023-02-23 MED ORDER — HYDROMORPHONE HCL 1 MG/ML IJ SOLN: 1.0000 mg | INTRAMUSCULAR | Status: DC | PRN

## 2023-02-23 MED ORDER — LIDOCAINE-EPINEPHRINE 1 %-1:100000 IJ SOLN
INTRAMUSCULAR | Status: AC
  Filled 2023-02-23: qty 1

## 2023-02-23 MED ORDER — ICOSAPENT ETHYL 1 G PO CAPS
2.0000 g | ORAL_CAPSULE | Freq: Two times a day (BID) | ORAL | Status: DC
  Administered 2023-02-23 – 2023-02-24 (×2): 2 g via ORAL
  Filled 2023-02-23 (×2): qty 2

## 2023-02-23 MED ORDER — ALUM & MAG HYDROXIDE-SIMETH 200-200-20 MG/5ML PO SUSP: 30.0000 mL | Freq: Four times a day (QID) | ORAL | Status: DC | PRN

## 2023-02-23 MED ORDER — MIDAZOLAM HCL 2 MG/2ML IJ SOLN
INTRAMUSCULAR | Status: DC | PRN
  Administered 2023-02-23: 2 mg via INTRAVENOUS

## 2023-02-23 MED ORDER — PROPOFOL 10 MG/ML IV BOLUS
INTRAVENOUS | Status: AC
  Filled 2023-02-23: qty 20

## 2023-02-23 MED ORDER — ENOXAPARIN SODIUM 40 MG/0.4ML IJ SOSY
40.0000 mg | PREFILLED_SYRINGE | INTRAMUSCULAR | Status: DC
  Administered 2023-02-24: 40 mg via SUBCUTANEOUS
  Filled 2023-02-23: qty 0.4

## 2023-02-23 MED ORDER — MENTHOL 3 MG MT LOZG
1.0000 | LOZENGE | OROMUCOSAL | Status: DC | PRN
Start: 2023-02-23 — End: 2023-02-24

## 2023-02-23 MED ORDER — LIDOCAINE-EPINEPHRINE 1 %-1:100000 IJ SOLN
INTRAMUSCULAR | Status: DC | PRN
  Administered 2023-02-23: 5 mL

## 2023-02-23 MED ORDER — 0.9 % SODIUM CHLORIDE (POUR BTL) OPTIME
TOPICAL | Status: DC | PRN
  Administered 2023-02-23 (×2): 1000 mL

## 2023-02-23 MED ORDER — PHENOL 1.4 % MT LIQD: 1.0000 | OROMUCOSAL | Status: DC | PRN

## 2023-02-23 MED ORDER — DOCUSATE SODIUM 100 MG PO CAPS
100.0000 mg | ORAL_CAPSULE | Freq: Two times a day (BID) | ORAL | Status: DC
  Administered 2023-02-23 – 2023-02-24 (×2): 100 mg via ORAL
  Filled 2023-02-23 (×2): qty 1

## 2023-02-23 MED ORDER — METOPROLOL TARTRATE 50 MG PO TABS
75.0000 mg | ORAL_TABLET | Freq: Two times a day (BID) | ORAL | Status: DC
  Administered 2023-02-23 – 2023-02-24 (×2): 75 mg via ORAL
  Filled 2023-02-23 (×2): qty 1

## 2023-02-23 MED ORDER — SUCCINYLCHOLINE CHLORIDE 200 MG/10ML IV SOSY
PREFILLED_SYRINGE | INTRAVENOUS | Status: AC
  Filled 2023-02-23: qty 10

## 2023-02-23 MED ORDER — ORAL CARE MOUTH RINSE: 15.0000 mL | Freq: Once | OROMUCOSAL | Status: AC

## 2023-02-23 MED ORDER — ONDANSETRON HCL 4 MG/2ML IJ SOLN
INTRAMUSCULAR | Status: DC | PRN
  Administered 2023-02-23: 4 mg via INTRAVENOUS

## 2023-02-23 MED ORDER — ROSUVASTATIN CALCIUM 20 MG PO TABS
40.0000 mg | ORAL_TABLET | Freq: Every day | ORAL | Status: DC
  Administered 2023-02-24: 40 mg via ORAL
  Filled 2023-02-23: qty 2

## 2023-02-23 MED ORDER — PROPOFOL 1000 MG/100ML IV EMUL
INTRAVENOUS | Status: AC
  Filled 2023-02-23: qty 100

## 2023-02-23 MED ORDER — HYDROMORPHONE HCL 1 MG/ML IJ SOLN
INTRAMUSCULAR | Status: AC
  Filled 2023-02-23: qty 1

## 2023-02-23 MED ORDER — OXYCODONE-ACETAMINOPHEN 5-325 MG PO TABS
1.0000 | ORAL_TABLET | Freq: Four times a day (QID) | ORAL | Status: DC | PRN
  Filled 2023-02-23: qty 1

## 2023-02-23 MED ORDER — ACETAMINOPHEN 650 MG RE SUPP: 650.0000 mg | RECTAL | Status: DC | PRN

## 2023-02-23 MED ORDER — FENTANYL CITRATE (PF) 100 MCG/2ML IJ SOLN
INTRAMUSCULAR | Status: AC
  Filled 2023-02-23: qty 2

## 2023-02-23 MED ORDER — POLYETHYLENE GLYCOL 3350 17 G PO PACK: 17.0000 g | PACK | Freq: Every day | ORAL | Status: DC | PRN

## 2023-02-23 MED ORDER — SODIUM CHLORIDE 0.9 % IV SOLN
250.0000 mL | INTRAVENOUS | Status: DC
  Administered 2023-02-23: 250 mL via INTRAVENOUS

## 2023-02-23 MED ORDER — BUPIVACAINE HCL (PF) 0.5 % IJ SOLN
INTRAMUSCULAR | Status: AC
  Filled 2023-02-23: qty 30

## 2023-02-23 MED ORDER — ACETAMINOPHEN 325 MG PO TABS: 650.0000 mg | ORAL_TABLET | ORAL | Status: DC | PRN

## 2023-02-23 MED ORDER — CHLORHEXIDINE GLUCONATE CLOTH 2 % EX PADS: 6.0000 | MEDICATED_PAD | Freq: Once | CUTANEOUS | Status: DC

## 2023-02-23 MED ORDER — CHLORHEXIDINE GLUCONATE 0.12 % MT SOLN
15.0000 mL | Freq: Once | OROMUCOSAL | Status: AC
  Administered 2023-02-23: 15 mL via OROMUCOSAL
  Filled 2023-02-23: qty 15

## 2023-02-23 MED ORDER — VANCOMYCIN HCL IN DEXTROSE 1-5 GM/200ML-% IV SOLN
1000.0000 mg | INTRAVENOUS | Status: AC
  Administered 2023-02-23: 1000 mg via INTRAVENOUS
  Filled 2023-02-23: qty 200

## 2023-02-23 MED ORDER — DEXAMETHASONE SODIUM PHOSPHATE 10 MG/ML IJ SOLN
INTRAMUSCULAR | Status: DC | PRN
  Administered 2023-02-23: 10 mg via INTRAVENOUS

## 2023-02-23 MED ORDER — SUCCINYLCHOLINE CHLORIDE 200 MG/10ML IV SOSY
PREFILLED_SYRINGE | INTRAVENOUS | Status: DC | PRN
  Administered 2023-02-23: 150 mg via INTRAVENOUS

## 2023-02-23 MED ORDER — LACTATED RINGERS IV SOLN: INTRAVENOUS | Status: DC | PRN

## 2023-02-23 MED ORDER — THROMBIN 5000 UNITS EX SOLR
CUTANEOUS | Status: AC
  Filled 2023-02-23: qty 5000

## 2023-02-23 MED ORDER — PHENYLEPHRINE HCL-NACL 20-0.9 MG/250ML-% IV SOLN
INTRAVENOUS | Status: DC | PRN
  Administered 2023-02-23: 100 ug/min via INTRAVENOUS
  Administered 2023-02-23: 40 ug/min via INTRAVENOUS
  Administered 2023-02-23: 60 ug/min via INTRAVENOUS

## 2023-02-23 MED ORDER — EPHEDRINE SULFATE-NACL 50-0.9 MG/10ML-% IV SOSY
PREFILLED_SYRINGE | INTRAVENOUS | Status: DC | PRN
  Administered 2023-02-23: 15 mg via INTRAVENOUS

## 2023-02-23 MED ORDER — AMLODIPINE BESYLATE 10 MG PO TABS
10.0000 mg | ORAL_TABLET | Freq: Every day | ORAL | Status: DC
  Administered 2023-02-24: 10 mg via ORAL
  Filled 2023-02-23: qty 1

## 2023-02-23 MED ORDER — FENTANYL CITRATE (PF) 250 MCG/5ML IJ SOLN
INTRAMUSCULAR | Status: AC
  Filled 2023-02-23: qty 5

## 2023-02-23 MED ORDER — LIDOCAINE 2% (20 MG/ML) 5 ML SYRINGE
INTRAMUSCULAR | Status: AC
  Filled 2023-02-23: qty 5

## 2023-02-23 MED ORDER — BUPIVACAINE HCL (PF) 0.5 % IJ SOLN
INTRAMUSCULAR | Status: DC | PRN
  Administered 2023-02-23: 5 mL
  Administered 2023-02-23: 25 mL

## 2023-02-23 MED ORDER — ONDANSETRON HCL 4 MG PO TABS: 4.0000 mg | ORAL_TABLET | Freq: Four times a day (QID) | ORAL | Status: DC | PRN

## 2023-02-23 MED ORDER — ONDANSETRON HCL 4 MG/2ML IJ SOLN
4.0000 mg | Freq: Four times a day (QID) | INTRAMUSCULAR | Status: DC | PRN
Start: 2023-02-23 — End: 2023-02-24

## 2023-02-23 MED ORDER — ONDANSETRON HCL 4 MG/2ML IJ SOLN
INTRAMUSCULAR | Status: AC
  Filled 2023-02-23: qty 2

## 2023-02-23 MED ORDER — FENTANYL CITRATE (PF) 250 MCG/5ML IJ SOLN
INTRAMUSCULAR | Status: DC | PRN
  Administered 2023-02-23: 100 ug via INTRAVENOUS
  Administered 2023-02-23 (×3): 50 ug via INTRAVENOUS

## 2023-02-23 MED ORDER — ISOSORBIDE MONONITRATE ER 60 MG PO TB24
60.0000 mg | ORAL_TABLET | Freq: Every day | ORAL | Status: DC
  Administered 2023-02-24: 60 mg via ORAL
  Filled 2023-02-23: qty 1

## 2023-02-23 MED ORDER — DIPHENHYDRAMINE HCL 50 MG/ML IJ SOLN
12.5000 mg | Freq: Once | INTRAMUSCULAR | Status: AC
  Administered 2023-02-23: 12.5 mg via INTRAVENOUS

## 2023-02-23 MED ORDER — LIDOCAINE 2% (20 MG/ML) 5 ML SYRINGE
INTRAMUSCULAR | Status: DC | PRN
  Administered 2023-02-23: 80 mg via INTRAVENOUS

## 2023-02-23 MED ORDER — BISACODYL 10 MG RE SUPP: 10.0000 mg | Freq: Every day | RECTAL | Status: DC | PRN

## 2023-02-23 MED ORDER — SODIUM CHLORIDE 0.9% FLUSH: 3.0000 mL | Freq: Two times a day (BID) | INTRAVENOUS | Status: DC

## 2023-02-23 MED ORDER — NITROGLYCERIN 0.4 MG SL SUBL: 0.4000 mg | SUBLINGUAL_TABLET | SUBLINGUAL | Status: DC | PRN

## 2023-02-23 MED ORDER — VANCOMYCIN HCL IN DEXTROSE 1-5 GM/200ML-% IV SOLN
1000.0000 mg | Freq: Once | INTRAVENOUS | Status: AC
  Administered 2023-02-23: 1000 mg via INTRAVENOUS
  Filled 2023-02-23: qty 200

## 2023-02-23 MED ORDER — PROPOFOL 500 MG/50ML IV EMUL
INTRAVENOUS | Status: DC | PRN
  Administered 2023-02-23: 100 ug/kg/min via INTRAVENOUS

## 2023-02-23 MED ORDER — SODIUM CHLORIDE 0.9% FLUSH: 3.0000 mL | INTRAVENOUS | Status: DC | PRN

## 2023-02-23 MED ORDER — HYDROMORPHONE HCL 1 MG/ML IJ SOLN
0.2500 mg | INTRAMUSCULAR | Status: DC | PRN
  Administered 2023-02-23 (×4): 0.5 mg via INTRAVENOUS

## 2023-02-23 MED ORDER — ALLOPURINOL 100 MG PO TABS
100.0000 mg | ORAL_TABLET | Freq: Every day | ORAL | Status: DC
  Administered 2023-02-24: 100 mg via ORAL
  Filled 2023-02-23: qty 1

## 2023-02-23 SURGICAL SUPPLY — 48 items
ADH SKN CLS APL DERMABOND .7 (GAUZE/BANDAGES/DRESSINGS) ×1
BAG COUNTER SPONGE SURGICOUNT (BAG) ×1 IMPLANT
BAG SPNG CNTER NS LX DISP (BAG) ×1
BLADE CLIPPER SURG (BLADE) IMPLANT
BOLT 5.0X45 SPINAL (Bolt) IMPLANT
BOLT PLATE DECADE 5.0X40M (Bolt) IMPLANT
BOLT PLATE XLIF 5.5X55 LRG (Bolt) IMPLANT
BONE MATRIX OSTEOCEL PRO LRG (Bone Implant) IMPLANT
DERMABOND ADVANCED .7 DNX12 (GAUZE/BANDAGES/DRESSINGS) ×1 IMPLANT
DRAPE C-ARM 42X72 X-RAY (DRAPES) ×1 IMPLANT
DRAPE C-ARMOR (DRAPES) ×1 IMPLANT
DRAPE LAPAROTOMY 100X72X124 (DRAPES) ×1 IMPLANT
DRSG OPSITE POSTOP 3X4 (GAUZE/BANDAGES/DRESSINGS) IMPLANT
DURAPREP 26ML APPLICATOR (WOUND CARE) ×1 IMPLANT
ELECT REM PT RETURN 9FT ADLT (ELECTROSURGICAL) ×1 IMPLANT
ELECTRODE REM PT RTRN 9FT ADLT (ELECTROSURGICAL) ×1 IMPLANT
GAUZE 4X4 16PLY ~~LOC~~+RFID DBL (SPONGE) IMPLANT
GLOVE BIOGEL PI IND STRL 8.5 (GLOVE) ×1 IMPLANT
GLOVE ECLIPSE 8.5 STRL (GLOVE) ×1 IMPLANT
GLOVE EXAM NITRILE XL STR (GLOVE) IMPLANT
GOWN STRL REUS W/ TWL LRG LVL3 (GOWN DISPOSABLE) IMPLANT
GOWN STRL REUS W/ TWL XL LVL3 (GOWN DISPOSABLE) ×1 IMPLANT
GOWN STRL REUS W/TWL 2XL LVL3 (GOWN DISPOSABLE) ×1 IMPLANT
GOWN STRL REUS W/TWL LRG LVL3 (GOWN DISPOSABLE)
GOWN STRL REUS W/TWL XL LVL3 (GOWN DISPOSABLE) ×1
HEMOSTAT POWDER KIT SURGIFOAM (HEMOSTASIS) IMPLANT
HEMOSTAT POWDER SURGIFOAM 1G (HEMOSTASIS) IMPLANT
KIT BASIN OR (CUSTOM PROCEDURE TRAY) ×1 IMPLANT
KIT DILATOR XLIF 5 (KITS) IMPLANT
KIT SURGICAL ACCESS MAXCESS 4 (KITS) IMPLANT
KIT TURNOVER KIT B (KITS) ×1 IMPLANT
MODULE NVM5 NEXT GEN EMG (NEUROSURGERY SUPPLIES) IMPLANT
MODULUS XLW 12X22X55MM 10 (Spine Construct) IMPLANT
NDL HYPO 25X1 1.5 SAFETY (NEEDLE) ×1 IMPLANT
NEEDLE HYPO 25X1 1.5 SAFETY (NEEDLE) ×1 IMPLANT
NS IRRIG 1000ML POUR BTL (IV SOLUTION) ×1 IMPLANT
PACK LAMINECTOMY NEURO (CUSTOM PROCEDURE TRAY) ×1 IMPLANT
PLATE DECADE 4HOLE SZ12 XLIF (Plate) IMPLANT
SCREW DECADE 5.5X50 (Screw) IMPLANT
SPONGE T-LAP 4X18 ~~LOC~~+RFID (SPONGE) IMPLANT
SUT VIC AB 3-0 SH 8-18 (SUTURE) ×1 IMPLANT
SUT VIC AB 4-0 RB1 18 (SUTURE) ×1 IMPLANT
SYR 30ML SLIP (SYRINGE) IMPLANT
TAPE CLOTH 4X10 WHT NS (GAUZE/BANDAGES/DRESSINGS) ×2 IMPLANT
TOWEL GREEN STERILE (TOWEL DISPOSABLE) ×1 IMPLANT
TOWEL GREEN STERILE FF (TOWEL DISPOSABLE) ×1 IMPLANT
TRAY FOLEY MTR SLVR 16FR STAT (SET/KITS/TRAYS/PACK) ×1 IMPLANT
WATER STERILE IRR 1000ML POUR (IV SOLUTION) ×1 IMPLANT

## 2023-02-23 NOTE — Op Note (Signed)
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  Genic claudication, lumbar radiculopathy. Postoperative diagnosis: Same Procedure: L3-L4 extreme lateral discectomy with decompression of the L3-L4 interspace.  Arthrodesis with titanium spacer and Osteocel allograft.  Plate fixation L2-G4 . fluoroscopic guidance.  EMG neuromonitoring. Surgeon: Barnett Abu First Assistant: Margy Clarks, MD Anesthesia: General endotracheal Indications: Robert Ramos is a 72 year old individual whose had previous decompression fusion at L4-5 and L5-S1 secondary to advancing spondylitic disease with stenosis and radiculopathy.  He now has adjacent level disease at the L3-4 level.  A little over a year ago he underwent posterior decompression via laminotomies however he soon developed recurrent stenosis with increased bulging of the disc at L3-4.  He was advised regarding indirect decompression using XLIF technique with arthrodesis using a lateral implant and plate fixation.  Procedure: The patient was brought to the operating room supine on the stretcher.  After the smooth induction of general tracheal anesthesia he was turned to the right lateral decubitus position and he was taped into position provisionally until fluoroscopically we could check that we had orthogonal imaging.  This was focused on the L3-4 disc space.  When verified the area of the incision was marked on the side and the skin was cleansed with alcohol and then prepped with DuraPrep and draped in a sterile fashion.  EMG monitoring was connected to the patient to test all the myotomes of the lumbar plexus.  After injecting 10 cc of lidocaine with epinephrine a linear incision was created in the region.  Then a blunt probe was passed through to the retroperitoneal space and rested onto the lateral aspect of the L3-L4 interspace.  This was checked fluoroscopically.  Dr. Maisie Fus helped with the  verification of localization and help to secure the pin in the lateral aspect of the vertebral body.  We then stimulated in the 360 degree fashion to make sure no elements of the lumbar plexus were involved.  None were noted.  A series of dilators was then passed over this tube to the 15 mm size and again serial stimulation was performed with no evidence of lumbar nerve root activity.  Then 130 mm retractor was placed over the outer tube and secured to the operating table with a clamp in the orthogonal position.  The retractor was opened slightly to allow removal of the inner tubes and the light source was placed.  On the posterior blade of the retractor stimulation was performed to make sure no elements of the lumbar plexus were nearby and when verified a shim was placed into the disc space under fluoroscopic guidance.  Next the retractor was opened further to expose the lateral aspect of the disc space at the L3-L4 level.  A discectomy was then performed by opening the lateral aspect of the ligament and a series of Cobb elevators were passed through the contralateral side.  A series of disc shavers were then used to release the disc from the endplates and a discectomy was performed this was done by Dr. Maisie Fus and myself working apart from each other.  Once the disc was removed adequately a series of sizers was placed initially starting with an 8 x 18 mm spacer and increasing this to an a 10 x 22 and ultimately a 12 x 22 spacer there is decided that the width should be 50 mm and this was chosen and packed with Osteocel allograft meanwhile Dr. Maisie Fus and I worked to remove all the  bony endplate material and assure good bony contact for the implant.  This was done with a series of rongeurs and curettes.  Once the disc was completely emptied the spacer was filled and preemptively about 5 cc of bone graft was placed into the interspace.  The spacer was tapped and under direct fluoroscopic visualization.  Both was  checked positioning and when verified we remove the attachment.  Then a lateral 12 mm plate was attached to the vertebral bodies at L3 and L4.  Ventrally we placed 55 mm screws at L4 and 50 mm screws at L3 45 mm screws were placed at the most dorsal aspect.  With this the system was torqued down to the appropriate specifications and the retractor was removed final radiographs were obtained in AP and lateral projection and showed good distraction of the disc base good alignment of the vertebrae and placement of the hardware.  Then the wound was carefully inspected 20 cc of half percent Marcaine were injected into the paraspinous fascia and the final closure of the deep fascia was performed with 2-0 Vicryl and 3-0 Vicryl was used in a subcuticular skin Dermabond was placed on the skin blood loss for the procedure was estimated about 25 cc.  Patient tolerated procedure well was returned to recovery in stable condition.

## 2023-02-23 NOTE — Plan of Care (Signed)

## 2023-02-23 NOTE — Progress Notes (Signed)
Pharmacy Antibiotic Note  Robert Ramos is a 72 y.o. male admitted on 02/23/2023 with surgical prophylaxis.  Pharmacy has been consulted for vancomycin dosing. Vancomycin 1000mg  IV x1 received pre-op. No drain in place so will order one time post-op dose and formally sign off consult.  Plan: Vancomycin 1000 mg IV x1 in 12 hours   Height: 5\' 5"  (165.1 cm) Weight: 83.9 kg (185 lb) IBW/kg (Calculated) : 61.5  Temp (24hrs), Avg:97.7 F (36.5 C), Min:97.6 F (36.4 C), Max:98 F (36.7 C)  Recent Labs  Lab 02/23/23 0832  WBC 11.1*  CREATININE 1.06    Estimated Creatinine Clearance: 63.7 mL/min (by C-G formula based on SCr of 1.06 mg/dL).    Allergies  Allergen Reactions   Penicillins Anaphylaxis, Swelling and Rash   Hydrocodone Nausea Only    Sick on stomach all day long    Oxycodone Nausea Only    Sick on stomach all day long    Zolpidem Other (See Comments)    dizziness   Januvia [Sitagliptin] Nausea And Vomiting and Rash    Full body rash    Thank you for allowing pharmacy to be a part of this patient's care.  Rexford Maus, PharmD, BCPS 02/23/2023 3:56 PM

## 2023-02-23 NOTE — Anesthesia Procedure Notes (Signed)
Procedure Name: Intubation Date/Time: 02/23/2023 11:42 AM  Performed by: Georgianne Fick D, CRNAPre-anesthesia Checklist: Patient identified, Emergency Drugs available, Suction available and Patient being monitored Patient Re-evaluated:Patient Re-evaluated prior to induction Oxygen Delivery Method: Circle System Utilized Preoxygenation: Pre-oxygenation with 100% oxygen Induction Type: IV induction Ventilation: Mask ventilation without difficulty Laryngoscope Size: Miller and 3 Grade View: Grade I Tube type: Oral Number of attempts: 1 Airway Equipment and Method: Stylet and Oral airway Placement Confirmation: ETT inserted through vocal cords under direct vision, positive ETCO2 and breath sounds checked- equal and bilateral Secured at: 23 cm Tube secured with: Tape Dental Injury: Teeth and Oropharynx as per pre-operative assessment

## 2023-02-23 NOTE — Transfer of Care (Signed)
Immediate Anesthesia Transfer of Care Note  Patient: Robert Ramos  Procedure(s) Performed: extreme Lateral Interbody Fusion - Lumbar two-Lumbar three - Interbody Fusion  Patient Location: PACU  Anesthesia Type:General  Level of Consciousness: awake, alert , and oriented  Airway & Oxygen Therapy: Patient Spontanous Breathing and Patient connected to face mask oxygen  Post-op Assessment: Report given to RN and Post -op Vital signs reviewed and stable  Post vital signs: Reviewed and stable  Last Vitals:  Vitals Value Taken Time  BP 100/68 02/23/23 1357  Temp    Pulse 76 02/23/23 1400  Resp 19 02/23/23 1400  SpO2 91 % 02/23/23 1400  Vitals shown include unfiled device data.  Last Pain:  Vitals:   02/23/23 0812  TempSrc:   PainSc: 6       Patients Stated Pain Goal: 3 (02/23/23 4696)  Complications: No notable events documented.

## 2023-02-23 NOTE — H&P (Signed)
Robert Ramos is an 72 y.o. male.   Chief Complaint: Back and bilateral leg pain history of fusion L4 to sacrum HPI: Patient is a 72 year old individuals had previous spondylitic disease at L4-5 and L5-S1.  He has undergone decompression and fusion at those levels and had done well for a period of time but developed significant adjacent level disease at L3-L4 now with central canal stenosis and lateral recess stenosis.  He has symptoms of neurogenic claudication in addition to lumbar radiculopathy.  After careful consideration of his options I advised surgical decompression arthrodesis using an indirect technique with an XLIF.  Past Medical History:  Diagnosis Date   Anemia 09/28/2019   Arthritis    "hands, back" (03/02/2018)   ASHD (arteriosclerotic heart disease) 08/27/2015   B12 deficiency 05/21/2022   Body mass index (BMI) 34.0-34.9, adult 03/15/2019   Chronic bilateral low back pain with bilateral sciatica 01/22/2020   Chronic pain of left knee 09/28/2019   Chronic pain syndrome 01/22/2020   Chronic radicular low back pain    Coronary artery disease    a. MI in 2002 s/p stenting to mRCA b.cath 11/2016 s/p DES to mLAD c. Cath 08/2017- patent LAD stent, 40% instent restenosis of mRCA, 99% posterolateral artery s/p PTCA & DES.   Disc displacement, lumbar 05/10/2019   Dyslipidemia 11/17/2016   Dyspnea    Essential (primary) hypertension 03/15/2019   Essential hypertension 08/27/2015   GERD (gastroesophageal reflux disease)    Gout    "on RX qod" (03/02/2018)   Heart attack (HCC) 2002   History of bacterial pneumonia 07/22/2020   History of kidney stones    Hyperlipidemia    Hypertension    Hypogonadism in male 08/27/2015   Kidney stone 08/27/2015   Long-term use of high-risk medication 08/27/2015   Lumbago with sciatica, right side 01/22/2020   Lumbar post-laminectomy syndrome 06/08/2019   Malaise and fatigue 08/27/2015   Mixed hyperlipidemia 08/27/2015   Non-seasonal  allergic rhinitis 07/04/2018   Obstructive sleep apnea 11/17/2016   Pneumonia    Pre-diabetes    Precordial chest pain 11/24/2017   Prediabetes 08/27/2015   Spinal stenosis, 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   Stage 3 chronic kidney disease (HCC) 08/27/2015   Patient unaware.  Creatine on 11/2021 was 1.27.   Tremor 05/30/2020    Past Surgical History:  Procedure Laterality Date   APPENDECTOMY  1974   BACK SURGERY     CORONARY ANGIOPLASTY WITH STENT PLACEMENT  2002; 11/20/2016   "@ [facility]; @ [place]"   CORONARY CTO INTERVENTION N/A 03/02/2018   Procedure: CORONARY CTO INTERVENTION - Right;  Surgeon: Swaziland, Peter M, MD;  Location: [facility] INVASIVE CV LAB;  Service: Cardiovascular;  Laterality: N/A;   CORONARY PRESSURE/FFR STUDY N/A 09/16/2017   Procedure: INTRAVASCULAR PRESSURE WIRE/FFR STUDY;  Surgeon: Lyn Records, MD;  Location: [facility] INVASIVE CV LAB;  Service: Cardiovascular;  Laterality: N/A;   CORONARY STENT INTERVENTION N/A 11/20/2016   Procedure: CORONARY STENT INTERVENTION;  Surgeon: Yvonne Kendall, MD;  Location: [facility] INVASIVE CV LAB;  Service: Cardiovascular;  Laterality: N/A;   CORONARY STENT INTERVENTION N/A 09/09/2017   Procedure: CORONARY STENT INTERVENTION;  Surgeon: Kathleene Hazel, MD;  Location: [facility] INVASIVE CV LAB;  Service: Cardiovascular;  Laterality: N/A;   CORONARY ULTRASOUND/IVUS N/A 11/20/2016   Procedure: Intravascular Ultrasound/IVUS;  Surgeon: Yvonne Kendall, MD;  Location: [facility] INVASIVE CV LAB;  Service: Cardiovascular;  Laterality: N/A;   HEMI-MICRODISCECTOMY LUMBAR LAMINECTOMY LEVEL 1 Left 2008  L4   KNEE ARTHROSCOPY Left 1983   LEFT HEART CATH AND CORONARY ANGIOGRAPHY N/A 11/20/2016   Procedure: LEFT HEART CATH AND CORONARY ANGIOGRAPHY;  Surgeon: Yvonne Kendall, MD;  Location: [facility] INVASIVE CV LAB;  Service: Cardiovascular;  Laterality: N/A;   LEFT HEART CATH AND CORONARY ANGIOGRAPHY N/A 09/09/2017   Procedure: LEFT HEART  CATH AND CORONARY ANGIOGRAPHY;  Surgeon: Kathleene Hazel, MD;  Location: [facility] INVASIVE CV LAB;  Service: Cardiovascular;  Laterality: N/A;   LEFT HEART CATH AND CORONARY ANGIOGRAPHY N/A 09/16/2017   Procedure: LEFT HEART CATH AND CORONARY ANGIOGRAPHY;  Surgeon: Lyn Records, MD;  Location: [facility] INVASIVE CV LAB;  Service: Cardiovascular;  Laterality: N/A;   LEFT HEART CATH AND CORONARY ANGIOGRAPHY N/A 02/21/2018   Procedure: LEFT HEART CATH AND CORONARY ANGIOGRAPHY;  Surgeon: Tonny Bollman, MD;  Location: [facility] INVASIVE CV LAB;  Service: Cardiovascular;  Laterality: N/A;   LUMBAR FUSION  12/11/2020   L1-2-3-4   LUMBAR LAMINECTOMY/DECOMPRESSION MICRODISCECTOMY N/A 11/11/2021   Procedure: LUMBAR THREE-FOUR SUBLAMINAR DECOMPRESSION;  Surgeon: Barnett Abu, MD;  Location: [facility] OR;  Service: Neurosurgery;  Laterality: N/A;   TONSILLECTOMY     ULTRASOUND GUIDANCE FOR VASCULAR ACCESS  03/02/2018   Procedure: Ultrasound Guidance For Vascular Access;  Surgeon: Swaziland, Peter M, MD;  Location: [facility] INVASIVE CV LAB;  Service: Cardiovascular;;    Family History  Problem Relation Age of Onset   Diabetes Mother    Cancer Father    Suicidality Brother    Social History:  reports that he quit smoking about 22 years ago. His smoking use included cigarettes. He started smoking about 57 years ago. He has a 35 pack-year smoking history. He has never used smokeless tobacco. He reports that he does not currently use alcohol after a past usage of about 4.0 standard drinks of alcohol per week. He reports that he does not use drugs.  Allergies:  Allergies  Allergen Reactions   Penicillins Anaphylaxis, Swelling and Rash   Hydrocodone Nausea Only    Sick on stomach all day long    Oxycodone Nausea Only    Sick on stomach all day long    Zolpidem Other (See Comments)    dizziness   Januvia [Sitagliptin] Nausea And Vomiting and Rash    Full body rash    Medications Prior to Admission  Medication Sig Dispense  Refill   allopurinol (ZYLOPRIM) 100 MG tablet Take 100 mg by mouth daily.  3   amLODipine (NORVASC) 10 MG tablet Take 1 tablet (10 mg total) by mouth daily. 90 tablet 1   aspirin EC 81 MG tablet Take 1 tablet (81 mg total) by mouth daily. 90 tablet 3   cyanocobalamin (,VITAMIN B-12,) 1000 MCG/ML injection Inject 1,000 mcg into the muscle every 30 (thirty) days.     diphenhydrAMINE HCl, Sleep, (ZZZQUIL) 50 MG/30ML LIQD Take 50 mg by mouth at bedtime as needed (sleep).     isosorbide mononitrate (IMDUR) 60 MG 24 hr tablet Take 1 tablet (60 mg total) by mouth daily. 90 tablet 3   methocarbamol (ROBAXIN) 500 MG tablet Take 1 tablet (500 mg total) by mouth every 6 (six) hours as needed for muscle spasms. 30 tablet 3   metoprolol tartrate (LOPRESSOR) 50 MG tablet TAKE 1 AND 1/2 TABLETS BY MOUTH TWICE DAILY 270 tablet 1   nitroGLYCERIN (NITROSTAT) 0.4 MG SL tablet DISSOLVE 1 TABLET UNDER THE TONGUE EVERY 5 MINUTES AS NEEDED FOR CHEST PAIN. DO NOT EXCEED A TOTAL OF 3 DOSES IN  15 MINUTES. 25 tablet 1   ranolazine (RANEXA) 1000 MG SR tablet Take 1 tablet (1,000 mg total) by mouth 2 (two) times daily. 180 tablet 1   rosuvastatin (CRESTOR) 40 MG tablet TAKE ONE TABLET BY MOUTH ONCE DAILY 90 tablet 2   traMADol (ULTRAM) 50 MG tablet Take 1 tablet (50 mg total) by mouth every 6 (six) hours as needed for moderate pain. 60 tablet 0   Vitamin D, Ergocalciferol, (DRISDOL) 50000 units CAPS capsule Take 50,000 Units by mouth every Sunday.   3   ciclopirox (PENLAC) 8 % solution Apply topically at bedtime. Apply over nail and surrounding skin. Apply daily over previous coat. After seven (7) days, may remove with alcohol and continue cycle. (Patient not taking: Reported on 02/16/2023) 6.6 mL 0   clopidogrel (PLAVIX) 75 MG tablet TAKE ONE TABLET BY MOUTH ONCE DAILY WITH BREAKFAST Restart on 12/13/20 90 tablet 2   terbinafine (LAMISIL AT) 1 % cream Apply 1 Application topically 2 (two) times daily. (Patient not taking:  Reported on 02/16/2023) 30 g 0   VASCEPA 1 g capsule Take 2 capsules (2 g total) by mouth 2 times daily at 12 noon and 4 pm. (Patient not taking: Reported on 02/16/2023) 120 capsule 2    No results found for this or any previous visit (from the past 48 hour(s)). No results found.  Review of Systems  Constitutional:  Positive for activity change.  Musculoskeletal:  Positive for back pain, gait problem and myalgias.  Neurological:  Positive for weakness and numbness.  All other systems reviewed and are negative.   There were no vitals taken for this visit. Physical Exam Constitutional:      Appearance: Normal appearance.  HENT:     Head: Normocephalic and atraumatic.     Right Ear: Tympanic membrane, ear canal and external ear normal.     Left Ear: Tympanic membrane, ear canal and external ear normal.     Nose: Nose normal.     Mouth/Throat:     Mouth: Mucous membranes are dry.     Pharynx: Oropharynx is clear.  Eyes:     Extraocular Movements: Extraocular movements intact.     Conjunctiva/sclera: Conjunctivae normal.     Pupils: Pupils are equal, round, and reactive to light.  Cardiovascular:     Rate and Rhythm: Normal rate and regular rhythm.     Pulses: Normal pulses.     Heart sounds: Normal heart sounds.  Pulmonary:     Effort: Pulmonary effort is normal.     Breath sounds: Normal breath sounds.  Abdominal:     General: Abdomen is flat. Bowel sounds are normal.     Palpations: Abdomen is soft.  Musculoskeletal:     Cervical back: Normal range of motion and neck supple.     Comments: Positive straight leg raising at 30 degrees bilaterally Patrick's maneuver is negative bilaterally  Skin:    General: Skin is warm and dry.     Capillary Refill: Capillary refill takes less than 2 seconds.  Neurological:     General: No focal deficit present.     Mental Status: He is alert. Mental status is at baseline.  Psychiatric:        Mood and Affect: Mood normal.         Behavior: Behavior normal.        Thought Content: Thought content normal.        Judgment: Judgment normal.      Assessment/Plan Spondylosis and  stenosis L3-L4 status post decompression arthrodesis L4 to sacrum with pedicle screw fixation.  Plan indirect decompression L3-L4 with XLIF technique lateral plate fixation.  Stefani Dama, MD 02/23/2023, 7:47 AM

## 2023-02-23 NOTE — Interval H&P Note (Signed)
History and Physical Interval Note:  02/23/2023 11:03 AM  Robert Ramos  has presented today for surgery, with the diagnosis of SPINAL STENOSIS, LUMBAR REGION WITH NEUROGENIC CLAUDICATION.  The various methods of treatment have been discussed with the patient and family. After consideration of risks, benefits and other options for treatment, the patient has consented to  Procedure(s) with comments: XLIF - L2-L3 - Interbody Fusion (N/A) - C3 as a surgical intervention.  The patient's history has been reviewed, patient examined, no change in status, stable for surgery.  I have reviewed the patient's chart and labs.  Questions were answered to the patient's satisfaction.     Stefani Dama

## 2023-02-23 NOTE — Plan of Care (Signed)
  Problem: Education: Goal: Knowledge of General Education information will improve Description: Including pain rating scale, medication(s)/side effects and non-pharmacologic comfort measures 02/23/2023 1931 by Janey Genta, RN Outcome: Progressing 02/23/2023 1929 by Janey Genta, RN Outcome: Progressing   Problem: Health Behavior/Discharge Planning: Goal: Ability to manage health-related needs will improve 02/23/2023 1931 by Janey Genta, RN Outcome: Progressing 02/23/2023 1929 by Janey Genta, RN Outcome: Progressing   Problem: Clinical Measurements: Goal: Ability to maintain clinical measurements within normal limits will improve 02/23/2023 1931 by Janey Genta, RN Outcome: Progressing 02/23/2023 1929 by Janey Genta, RN Outcome: Progressing Goal: Will remain free from infection 02/23/2023 1931 by Janey Genta, RN Outcome: Progressing 02/23/2023 1929 by Janey Genta, RN Outcome: Progressing Goal: Diagnostic test results will improve 02/23/2023 1931 by Janey Genta, RN Outcome: Progressing 02/23/2023 1929 by Janey Genta, RN Outcome: Progressing Goal: Respiratory complications will improve 02/23/2023 1931 by Janey Genta, RN Outcome: Progressing 02/23/2023 1929 by Janey Genta, RN Outcome: Progressing  Problem: Nutrition: Goal: Adequate nutrition will be maintained 02/23/2023 1931 by Janey Genta, RN Outcome: Progressing 02/23/2023 1929 by Janey Genta, RN Outcome: Progressing   Problem: Pain Management: Goal: General experience of comfort will improve 02/23/2023 1931 by Janey Genta, RN Outcome: Progressing 02/23/2023 1929 by Janey Genta, RN Outcome: Progressing   Problem: Skin Integrity: Goal: Risk for impaired skin integrity will decrease 02/23/2023 1931 by Janey Genta, RN Outcome: Progressing 02/23/2023 1929 by Janey Genta, RN Outcome: Progressing   Problem: Education: Goal: Ability to verbalize  activity precautions or restrictions will improve 02/23/2023 1931 by Janey Genta, RN Outcome: Progressing 02/23/2023 1929 by Janey Genta, RN Outcome: Progressing Goal: Knowledge of the prescribed therapeutic regimen will improve 02/23/2023 1931 by Janey Genta, RN Outcome: Progressing 02/23/2023 1929 by Janey Genta, RN Outcome: Progressing Goal: Understanding of discharge needs will improve 02/23/2023 1931 by Janey Genta, RN Outcome: Progressing 02/23/2023 1929 by Janey Genta, RN Outcome: Progressing   Problem: Activity: Goal: Ability to avoid complications of mobility impairment will improve 02/23/2023 1931 by Janey Genta, RN Outcome: Progressing 02/23/2023 1929 by Janey Genta, RN Outcome: Progressing Goal: Ability to tolerate increased activity will improve 02/23/2023 1931 by Janey Genta, RN Outcome: Progressing 02/23/2023 1929 by Janey Genta, RN Outcome: Progressing Goal: Will remain free from falls 02/23/2023 1931 by Janey Genta, RN Outcome: Progressing 02/23/2023 1929 by Janey Genta, RN Outcome: Progressing   Problem: Bowel/Gastric: Goal: Gastrointestinal status for postoperative course will improve 02/23/2023 1931 by Janey Genta, RN Outcome: Progressing 02/23/2023 1929 by Janey Genta, RN Outcome: Progressing   Problem: Clinical Measurements: Goal: Ability to maintain clinical measurements within normal limits will improve 02/23/2023 1931 by Janey Genta, RN Outcome: Progressing 02/23/2023 1929 by Janey Genta, RN Outcome: Progressing Goal: Postoperative complications will be avoided or minimized 02/23/2023 1931 by Janey Genta, RN Outcome: Progressing 02/23/2023 1929 by Janey Genta, RN Outcome: Progressing Goal: Diagnostic test results will improve 02/23/2023 1931 by Janey Genta, RN Outcome: Progressing 02/23/2023 1929 by Janey Genta, RN Outcome: Progressing

## 2023-02-24 ENCOUNTER — Encounter (HOSPITAL_COMMUNITY): Payer: Self-pay | Admitting: Neurological Surgery

## 2023-02-24 MED ORDER — METHOCARBAMOL 500 MG PO TABS: 500.0000 mg | ORAL_TABLET | Freq: Four times a day (QID) | ORAL | 3 refills | Status: AC | PRN

## 2023-02-24 MED ORDER — HYDROMORPHONE HCL 2 MG PO TABS: 2.0000 mg | ORAL_TABLET | ORAL | 0 refills | Status: DC | PRN

## 2023-02-24 MED ORDER — DEXAMETHASONE 1 MG PO TABS: ORAL_TABLET | ORAL | 0 refills | Status: DC

## 2023-02-24 NOTE — Evaluation (Signed)
Occupational Therapy Evaluation Patient Details Name: Robert Ramos MRN: [id number] DOB: 09-29-1950 Today's Date: 02/24/2023   History of Present Illness Robert Ramos is a 72 yo male who underwent surgical decompression at L3-L4 via an extreme lateral approach 11/12. PMH includes: Arthritis, Gout, HTN, Hyperlipidemia, CAD, previous lumbar surgery.   Clinical Impression   Robert Ramos was evaluated s/p the above spine surgery. He is mod I with intermittent use of SPC at baseline. Upon evaluation pt was limited by LLE pain, generalized weakness, spinal precautions and activity tolerance. Overall he completed all ADLs and mobility with mod I including stair negotiation. Provided cues and education on spinal precautions and compensatory techniques throughout, handout provided and pt demonstrated great recall during ADLs and mobility. Pt does not require further acute OT services. Recommend d/c home with support of family.         If plan is discharge home, recommend the following: Assist for transportation;Assistance with cooking/housework    Functional Status Assessment  Patient has had a recent decline in their functional status and demonstrates the ability to make significant improvements in function in a reasonable and predictable amount of time.  Equipment Recommendations  None recommended by OT        Precautions / Restrictions Precautions Precautions: Fall;Back Precaution Booklet Issued: Yes (comment) Required Braces or Orthoses: Spinal Brace Spinal Brace: Lumbar corset Spinal Brace Comments: Brace ordered for OOB; no brace in room. Pt states he has a brace at home Restrictions Weight Bearing Restrictions: No      Mobility Bed Mobility Overal bed mobility: Modified Independent             General bed mobility comments: great log roll without cues    Transfers Overall transfer level: Independent            Balance Overall balance assessment: No apparent  balance deficits (not formally assessed)                       ADL either performed or assessed with clinical judgement   ADL Overall ADL's : Modified independent           General ADL Comments: pt able to recall BLT well; reports use of AE at home to comply with spinal precuations.     Vision Baseline Vision/History: 1 Wears glasses Vision Assessment?: No apparent visual deficits     Perception Perception: Within Functional Limits       Praxis Praxis: WFL       Pertinent Vitals/Pain Pain Assessment Pain Assessment: Faces Faces Pain Scale: Hurts a little bit Pain Location: surgical site Pain Descriptors / Indicators: Discomfort Pain Intervention(s): Limited activity within patient's tolerance, Monitored during session     Extremity/Trunk Assessment Upper Extremity Assessment Upper Extremity Assessment: Overall WFL for tasks assessed   Lower Extremity Assessment Lower Extremity Assessment: LLE deficits/detail LLE Deficits / Details: pain, weakness 4/5 LLE Coordination: WNL   Cervical / Trunk Assessment Cervical / Trunk Assessment: Back Surgery   Communication Communication Communication: No apparent difficulties   Cognition Arousal: Alert Behavior During Therapy: WFL for tasks assessed/performed Overall Cognitive Status: Within Functional Limits for tasks assessed                 General Comments  VSS, wife present     Home Living Family/patient expects to be discharged to:: Private residence Living Arrangements: Children;Spouse/significant other Available Help at Discharge: Family;Available 24 hours/day Type of Home: House Home Access: Stairs to enter [employer]  of Steps: 3 Entrance Stairs-Rails: Right Home Layout: One level     Bathroom Shower/Tub: Producer, television/film/video: Standard     Home Equipment: Cane - single point;Grab bars - toilet;Adaptive equipment;Shower Engineering geologist: Reacher;Sock  aid        Prior Functioning/Environment Prior Level of Function : Independent/Modified Independent;Driving             Mobility Comments: intermittent use of SPC ADLs Comments: mod I, drives        OT Problem List: Decreased activity tolerance;Decreased knowledge of precautions         OT Goals(Current goals can be found in the care plan section) Acute Rehab OT Goals Patient Stated Goal: home asap OT Goal Formulation: With patient Time For Goal Achievement: 02/24/23 Potential to Achieve Goals: Good   AM-PAC OT "6 Clicks" Daily Activity     Outcome Measure Help from another person eating meals?: None Help from another person taking care of personal grooming?: None Help from another person toileting, which includes using toliet, bedpan, or urinal?: None Help from another person bathing (including washing, rinsing, drying)?: None Help from another person to put on and taking off regular upper body clothing?: None Help from another person to put on and taking off regular lower body clothing?: None 6 Click Score: 24   End of Session Equipment Utilized During Treatment: Gait belt Nurse Communication: Mobility status  Activity Tolerance: Patient tolerated treatment well Patient left: in bed;with call bell/phone within reach;with family/visitor present  OT Visit Diagnosis: Other abnormalities of gait and mobility (R26.89);Pain                Time: 1001-1018 OT Time Calculation (min): 17 min Charges:  OT General Charges $OT Visit: 1 Visit OT Evaluation $OT Eval Low Complexity: 1 Low  Robert Ramos, OTR/L Acute Rehabilitation Services Office [phone] Secure Chat Communication Preferred   Robert Ramos 02/24/2023, 10:27 AM

## 2023-02-24 NOTE — Discharge Summary (Signed)
Physician Discharge Summary  Patient ID: Robert Ramos MRN: [id number] DOB/AGE: 06/19/1950 72 y.o.  Admit date: 02/23/2023 Discharge date: 02/24/2023  Admission Diagnoses: Lumbar spondylosis and stenosis L3-L4.  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  Left lumbar radiculopathy  Discharge Diagnoses: Lumbar spondylosis and stenosis L3-L4.  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  Left lumbar radiculopathy. Principal Problem:   Lumbar stenosis with neurogenic claudication   Discharged Condition: good  Hospital Course: Patient was admitted to undergo surgical decompression at L3-L4 via an extreme lateral approach.  He tolerated surgery well.  He has noted some left iliopsoas weakness that is worse now than before surgery.  This is not unexpected considering the nature of the surgery.  This should resolve in the next 6 weeks time.  Consults: None  Significant Diagnostic Studies: None  Treatments: surgery: See op note  Discharge Exam: Blood pressure 112/73, pulse 72, temperature 97.6 F (36.4 C), temperature source Oral, resp. rate 18, height 5\' 5"  (1.651 m), weight 83.9 kg, SpO2 98%. Station and gait are intact.  Weakness in iliopsoas was graded 2 out of 5.  Quadricep strength is good.  Incision is clean and dry.  Disposition: Discharge disposition: 01-Home or Self Care       Discharge Instructions     Call MD for:  redness, tenderness, or signs of infection (pain, swelling, redness, odor or green/yellow discharge around incision site)   Complete by: As directed    Call MD for:  severe uncontrolled pain   Complete by: As directed    Call MD for:  temperature >100.4   Complete by: As directed    Diet - low sodium heart healthy   Complete by: As directed    Incentive spirometry RT   Complete by: As directed    Increase activity slowly   Complete by: As directed       Allergies as of 02/24/2023       Reactions   Penicillins Anaphylaxis, Swelling, Rash   Hydrocodone Nausea Only    Sick on stomach all day long    Oxycodone Nausea Only   Sick on stomach all day long    Zolpidem Other (See Comments)   dizziness   Januvia [sitagliptin] Nausea And Vomiting, Rash   Full body rash        Medication List     TAKE these medications    allopurinol 100 MG tablet Commonly known as: ZYLOPRIM Take 100 mg by mouth daily.   amLODipine 10 MG tablet Commonly known as: NORVASC Take 1 tablet (10 mg total) by mouth daily.   aspirin EC 81 MG tablet Take 1 tablet (81 mg total) by mouth daily.   ciclopirox 8 % solution Commonly known as: Penlac Apply topically at bedtime. Apply over nail and surrounding skin. Apply daily over previous coat. After seven (7) days, may remove with alcohol and continue cycle.   clopidogrel 75 MG tablet Commonly known as: PLAVIX TAKE ONE TABLET BY MOUTH ONCE DAILY WITH BREAKFAST Restart on 12/13/20   cyanocobalamin 1000 MCG/ML injection Commonly known as: VITAMIN B12 Inject 1,000 mcg into the muscle every 30 (thirty) days.   dexamethasone 1 MG tablet Commonly known as: DECADRON 2 tablets twice daily for 2 days, one tablet twice daily for 2 days, one tablet daily for 2 days.   HYDROmorphone 2 MG tablet Commonly known as: DILAUDID Take 1 tablet (2 mg total) by mouth every 4 (four) hours as needed for severe pain (pain score 7-10).  isosorbide mononitrate 60 MG 24 hr tablet Commonly known as: IMDUR Take 1 tablet (60 mg total) by mouth daily.   methocarbamol 500 MG tablet Commonly known as: ROBAXIN Take 1 tablet (500 mg total) by mouth every 6 (six) hours as needed for muscle spasms. What changed: Another medication with the same name was added. Make sure you understand how and when to take each.   methocarbamol 500 MG tablet Commonly known as: ROBAXIN Take 1 tablet (500 mg total) by mouth every 6 (six) hours as needed for muscle spasms. What changed: You were already taking a medication with the same name, and this prescription  was added. Make sure you understand how and when to take each.   metoprolol tartrate 50 MG tablet Commonly known as: LOPRESSOR TAKE 1 AND 1/2 TABLETS BY MOUTH TWICE DAILY   nitroGLYCERIN 0.4 MG SL tablet Commonly known as: NITROSTAT DISSOLVE 1 TABLET UNDER THE TONGUE EVERY 5 MINUTES AS NEEDED FOR CHEST PAIN. DO NOT EXCEED A TOTAL OF 3 DOSES IN 15 MINUTES.   ranolazine 1000 MG SR tablet Commonly known as: RANEXA Take 1 tablet (1,000 mg total) by mouth 2 (two) times daily.   rosuvastatin 40 MG tablet Commonly known as: CRESTOR TAKE ONE TABLET BY MOUTH ONCE DAILY   terbinafine 1 % cream Commonly known as: LamISIL AT Apply 1 Application topically 2 (two) times daily.   traMADol 50 MG tablet Commonly known as: ULTRAM Take 1 tablet (50 mg total) by mouth every 6 (six) hours as needed for moderate pain.   Vascepa 1 g capsule Generic drug: icosapent Ethyl Take 2 capsules (2 g total) by mouth 2 times daily at 12 noon and 4 pm.   Vitamin D (Ergocalciferol) 1.25 MG (50000 UNIT) Caps capsule Commonly known as: DRISDOL Take 50,000 Units by mouth every Sunday.   ZzzQuil 50 MG/30ML Liqd Generic drug: diphenhydrAMINE HCl (Sleep) Take 50 mg by mouth at bedtime as needed (sleep).         Signed: Shary Key Alisen Ramos 02/24/2023, 9:03 AM

## 2023-02-24 NOTE — Plan of Care (Signed)
Pt doing well. Pt and wife given D/C instructions with verbal understanding. Rx's were sent to the pharmacy by MD. Pt's incision is clean and dry with no sign of infection. Pt's IV was removed prior to D/C. Pt D/C'd home via wheelchair per MD order. Pt is stable @ D/C and has no other needs at this time. Amandine Covino, RN  

## 2023-02-24 NOTE — Anesthesia Postprocedure Evaluation (Signed)
Anesthesia Post Note  Patient: Robert Ramos  Procedure(s) Performed: extreme Lateral Interbody Fusion - Lumbar two-Lumbar three - Interbody Fusion     Patient location during evaluation: PACU Anesthesia Type: General Level of consciousness: awake and alert Pain management: pain level controlled Vital Signs Assessment: post-procedure vital signs reviewed and stable Respiratory status: spontaneous breathing, nonlabored ventilation, respiratory function stable and patient connected to nasal cannula oxygen Cardiovascular status: blood pressure returned to baseline and stable Postop Assessment: no apparent nausea or vomiting Anesthetic complications: no   No notable events documented.  Last Vitals:  Vitals:   02/24/23 0337 02/24/23 0752  BP: 117/78 112/73  Pulse: 68 72  Resp: 18 18  Temp: (!) 36.4 C 36.4 C  SpO2: 95% 98%    Last Pain:  Vitals:   02/24/23 0935  TempSrc:   PainSc: 5                  Reda Citron P Jeremia Groot

## 2023-03-08 ENCOUNTER — Other Ambulatory Visit: Payer: Self-pay | Admitting: Cardiology

## 2023-03-13 ENCOUNTER — Other Ambulatory Visit: Payer: Self-pay | Admitting: Cardiology

## 2023-04-28 DIAGNOSIS — K219 Gastro-esophageal reflux disease without esophagitis: Secondary | ICD-10-CM | POA: Insufficient documentation

## 2023-04-28 DIAGNOSIS — G8929 Other chronic pain: Secondary | ICD-10-CM | POA: Insufficient documentation

## 2023-04-28 DIAGNOSIS — J189 Pneumonia, unspecified organism: Secondary | ICD-10-CM | POA: Insufficient documentation

## 2023-04-28 DIAGNOSIS — I10 Essential (primary) hypertension: Secondary | ICD-10-CM | POA: Insufficient documentation

## 2023-04-28 DIAGNOSIS — R0600 Dyspnea, unspecified: Secondary | ICD-10-CM | POA: Insufficient documentation

## 2023-04-29 ENCOUNTER — Ambulatory Visit: Payer: PPO | Attending: Cardiology | Admitting: Cardiology

## 2023-05-11 ENCOUNTER — Telehealth: Payer: Self-pay

## 2023-05-11 NOTE — Telephone Encounter (Signed)
   Name: Robert Ramos  DOB: 09-Sep-1950  MRN: [id number]  Primary Cardiologist: Gypsy Balsam, MD   Preoperative team, please contact this patient and set up a phone call appointment for further preoperative risk assessment. Please obtain consent and complete medication review. Thank you for your help.  I also confirmed the patient resides in the state of [place]. As per [facility] Board telemedicine laws, the patient must reside in the state in which the provider is licensed. Seagrove [place]isbury   [place], [place] 05/11/2023, 12:46 PM [facility]

## 2023-05-11 NOTE — Telephone Encounter (Signed)
Pt has appt 05/26/23 with Gypsy Balsam, MD  Call back staff - disregard message to schedule virtual visit.  Dr. Bing Matter - Please address surgical clearance at your upcoming office visit and send your note outlining surgical clearance to the requesting surgeon.  Note will be removed from preop pool. Tereso Newcomer, PA-C    05/11/2023 4:42 PM

## 2023-05-11 NOTE — Telephone Encounter (Signed)
I will confirm with preop APP pt has appt 05/26/23 with Dr. Bing Matter and procedure date is 06/03/23. Does pt still need tele appt as well?

## 2023-05-11 NOTE — Telephone Encounter (Signed)
   Pre-operative Risk Assessment    Patient Name: Robert Ramos  DOB: [birth year] MRN: [id number]   Date of last office visit: 07/29/22 Date of next office visit: 05/26/23   Request for Surgical Clearance    Procedure:   left carpal tunnel release  Date of Surgery:  Clearance 06/03/23                                Surgeon:  Dr. Tommie Raymond Surgeon's Group or Practice Name:  [facility] Phone number:  [phone] Fax number:  [phone] Attention Rhonda   Type of Clearance Requested:   - Medical    Type of Anesthesia:  Local    Additional requests/questions:    SignedDione Housekeeper   05/11/2023, 10:07 AM

## 2023-05-26 ENCOUNTER — Ambulatory Visit: Payer: PPO | Attending: Cardiology | Admitting: Cardiology

## 2023-05-26 ENCOUNTER — Encounter: Payer: Self-pay | Admitting: Cardiology

## 2023-05-26 VITALS — BP 106/70 | HR 84 | Ht 65.0 in | Wt 193.8 lb

## 2023-05-26 DIAGNOSIS — Z01818 Encounter for other preprocedural examination: Secondary | ICD-10-CM

## 2023-05-26 DIAGNOSIS — R7303 Prediabetes: Secondary | ICD-10-CM

## 2023-05-26 DIAGNOSIS — E785 Hyperlipidemia, unspecified: Secondary | ICD-10-CM | POA: Diagnosis not present

## 2023-05-26 DIAGNOSIS — G4733 Obstructive sleep apnea (adult) (pediatric): Secondary | ICD-10-CM | POA: Diagnosis not present

## 2023-05-26 DIAGNOSIS — I25118 Atherosclerotic heart disease of native coronary artery with other forms of angina pectoris: Secondary | ICD-10-CM | POA: Diagnosis not present

## 2023-05-26 NOTE — Patient Instructions (Signed)

## 2023-05-26 NOTE — Progress Notes (Signed)
Cardiology Office Note:    Date:  05/26/2023   ID:  Robert Ramos, DOB 06-May-1950, MRN [id number]  PCP:  Gordan Payment., MD  Cardiologist:  Gypsy Balsam, MD    Referring MD: Gordan Payment., MD   Chief Complaint  Patient presents with   Clearance 06/03/2023    History of Present Illness:    Robert Ramos is a 73 y.o. male   with coronary artery disease status post PCI of the LAD in 2018, status post PCI of the posterior lateral branch of the RCA in May 2019, hypertension, hyperlipidemia.  He has had continued symptoms of exertional chest discomfort and shortness of breath at low level activity despite treatment with multidrug antianginal therapy.  STress testing was abnormal.  He was referred to Dr. Excell Seltzer for further evaluation.  Cardiac catheterization demonstrated moderate LAD stenosis unchanged from previous study, patent LCx with small vessel disease involving the OM branches not amenable to PCI and interval occlusion of the mid RCA with left-to-right collaterals.  Patient was referred to Dr. Swaziland for possible CTO PCI of the RCA.  He underwent attempted CTO PCI of the RCA but this was unsuccessful.  Continued medical therapy was recommended.  He comes today to months for follow-up overall cardiac wise doing well.  He is scheduled to have a wrist left wrist surgery for carpal tunnel syndrome.  This cannot be done under local anesthesia.  Denies have any cardiac complaints no chest pain tightness squeezing pressure burning chest but what slows him a lot is the fact that he got some chronic back problem   Past Medical History:  Diagnosis Date   Anemia 09/28/2019   Arthritis    "hands, back" (03/02/2018)   ASHD (arteriosclerotic heart disease) 08/27/2015   B12 deficiency 05/21/2022   Body mass index (BMI) 34.0-34.9, adult 03/15/2019   Chronic bilateral low back pain with bilateral sciatica 01/22/2020   Chronic pain of left knee 09/28/2019   Chronic pain syndrome  01/22/2020   Chronic radicular low back pain    Coronary artery disease    a. MI in 2002 s/p stenting to mRCA b.cath 11/2016 s/p DES to mLAD c. Cath 08/2017- patent LAD stent, 40% instent restenosis of mRCA, 99% posterolateral artery s/p PTCA & DES.   Disc displacement, lumbar 05/10/2019   Dyslipidemia 11/17/2016   Dyspnea    Essential (primary) hypertension 03/15/2019   Essential hypertension 08/27/2015   Facial lesion 11/29/2020   GERD (gastroesophageal reflux disease)    Gout    "on RX qod" (03/02/2018)   Heart attack (HCC) 2002   History of bacterial pneumonia 07/22/2020   History of kidney stones    Hyperlipidemia    Hypertension    Hypogonadism in male 08/27/2015   Kidney stone 08/27/2015   Long-term use of high-risk medication 08/27/2015   Lumbago with sciatica, right side 01/22/2020   Lumbar foraminal stenosis 12/06/2020   Lumbar post-laminectomy syndrome 06/08/2019   Lumbar stenosis with neurogenic claudication 11/11/2021   Malaise and fatigue 08/27/2015   Mixed hyperlipidemia 08/27/2015   Non-seasonal allergic rhinitis 07/04/2018   Obstructive sleep apnea 11/17/2016   Pneumonia    Precordial chest pain 11/24/2017   Prediabetes 08/27/2015   Radiculopathy, lumbar region 03/15/2019   Shortness of breath 11/17/2016   Spinal stenosis, 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   Stage 3 chronic kidney disease (HCC) 08/27/2015   Patient unaware.  Creatine on 11/2021 was 1.27.   Tremor 05/30/2020    Past Surgical  History:  Procedure Laterality Date   ANTERIOR LAT LUMBAR FUSION N/A 02/23/2023   Procedure: extreme Lateral Interbody Fusion - Lumbar two-Lumbar three - Interbody Fusion;  Surgeon: Barnett Abu, MD;  Location: [facility] OR;  Service: Neurosurgery;  Laterality: N/A;   APPENDECTOMY  1974   BACK SURGERY     CORONARY ANGIOPLASTY WITH STENT PLACEMENT  2002; 11/20/2016   "@ [facility]; @ [place]"   CORONARY CTO INTERVENTION N/A 03/02/2018   Procedure:  CORONARY CTO INTERVENTION - Right;  Surgeon: Swaziland, Peter M, MD;  Location: [facility] INVASIVE CV LAB;  Service: Cardiovascular;  Laterality: N/A;   CORONARY PRESSURE/FFR STUDY N/A 09/16/2017   Procedure: INTRAVASCULAR PRESSURE WIRE/FFR STUDY;  Surgeon: Lyn Records, MD;  Location: [facility] INVASIVE CV LAB;  Service: Cardiovascular;  Laterality: N/A;   CORONARY STENT INTERVENTION N/A 11/20/2016   Procedure: CORONARY STENT INTERVENTION;  Surgeon: Yvonne Kendall, MD;  Location: [facility] INVASIVE CV LAB;  Service: Cardiovascular;  Laterality: N/A;   CORONARY STENT INTERVENTION N/A 09/09/2017   Procedure: CORONARY STENT INTERVENTION;  Surgeon: Kathleene Hazel, MD;  Location: [facility] INVASIVE CV LAB;  Service: Cardiovascular;  Laterality: N/A;   CORONARY ULTRASOUND/IVUS N/A 11/20/2016   Procedure: Intravascular Ultrasound/IVUS;  Surgeon: Yvonne Kendall, MD;  Location: [facility] INVASIVE CV LAB;  Service: Cardiovascular;  Laterality: N/A;   HEMI-MICRODISCECTOMY LUMBAR LAMINECTOMY LEVEL 1 Left 2008   L4   KNEE ARTHROSCOPY Left 1983   LEFT HEART CATH AND CORONARY ANGIOGRAPHY N/A 11/20/2016   Procedure: LEFT HEART CATH AND CORONARY ANGIOGRAPHY;  Surgeon: Yvonne Kendall, MD;  Location: [facility] INVASIVE CV LAB;  Service: Cardiovascular;  Laterality: N/A;   LEFT HEART CATH AND CORONARY ANGIOGRAPHY N/A 09/09/2017   Procedure: LEFT HEART CATH AND CORONARY ANGIOGRAPHY;  Surgeon: Kathleene Hazel, MD;  Location: [facility] INVASIVE CV LAB;  Service: Cardiovascular;  Laterality: N/A;   LEFT HEART CATH AND CORONARY ANGIOGRAPHY N/A 09/16/2017   Procedure: LEFT HEART CATH AND CORONARY ANGIOGRAPHY;  Surgeon: Lyn Records, MD;  Location: [facility] INVASIVE CV LAB;  Service: Cardiovascular;  Laterality: N/A;   LEFT HEART CATH AND CORONARY ANGIOGRAPHY N/A 02/21/2018   Procedure: LEFT HEART CATH AND CORONARY ANGIOGRAPHY;  Surgeon: Tonny Bollman, MD;  Location: [facility] INVASIVE CV LAB;  Service: Cardiovascular;  Laterality: N/A;   LUMBAR FUSION   12/11/2020   L1-2-3-4   LUMBAR LAMINECTOMY/DECOMPRESSION MICRODISCECTOMY N/A 11/11/2021   Procedure: LUMBAR THREE-FOUR SUBLAMINAR DECOMPRESSION;  Surgeon: Barnett Abu, MD;  Location: [facility] OR;  Service: Neurosurgery;  Laterality: N/A;   TONSILLECTOMY     ULTRASOUND GUIDANCE FOR VASCULAR ACCESS  03/02/2018   Procedure: Ultrasound Guidance For Vascular Access;  Surgeon: Swaziland, Peter M, MD;  Location: [facility] INVASIVE CV LAB;  Service: Cardiovascular;;    Current Medications: Current Meds  Medication Sig   allopurinol (ZYLOPRIM) 100 MG tablet Take 100 mg by mouth daily.   amLODipine (NORVASC) 10 MG tablet Take 1 tablet (10 mg total) by mouth daily.   aspirin EC 81 MG tablet Take 1 tablet (81 mg total) by mouth daily.   ciclopirox (PENLAC) 8 % solution Apply topically at bedtime. Apply over nail and surrounding skin. Apply daily over previous coat. After seven (7) days, may remove with alcohol and continue cycle. (Patient taking differently: Apply 1 Application topically at bedtime. Apply over nail and surrounding skin. Apply daily over previous coat. After seven (7) days, may remove with alcohol and continue cycle.)   clopidogrel (PLAVIX) 75 MG tablet TAKE ONE TABLET BY MOUTH ONCE DAILY WITH  BREAKFAST Restart on 12/13/20   cyanocobalamin (,VITAMIN B-12,) 1000 MCG/ML injection Inject 1,000 mcg into the muscle every 30 (thirty) days.   dexamethasone (DECADRON) 1 MG tablet 2 tablets twice daily for 2 days, one tablet twice daily for 2 days, one tablet daily for 2 days. (Patient taking differently: Take 1 mg by mouth See admin instructions. 2 tablets twice daily for 2 days, one tablet twice daily for 2 days, one tablet daily for 2 days.)   diphenhydrAMINE HCl, Sleep, (ZZZQUIL) 50 MG/30ML LIQD Take 50 mg by mouth at bedtime as needed (sleep).   HYDROmorphone (DILAUDID) 2 MG tablet Take 1 tablet (2 mg total) by mouth every 4 (four) hours as needed for severe pain (pain score 7-10).   isosorbide mononitrate  (IMDUR) 60 MG 24 hr tablet Take 1 tablet (60 mg total) by mouth daily.   methocarbamol (ROBAXIN) 500 MG tablet Take 1 tablet (500 mg total) by mouth every 6 (six) hours as needed for muscle spasms.   methocarbamol (ROBAXIN) 500 MG tablet Take 1 tablet (500 mg total) by mouth every 6 (six) hours as needed for muscle spasms.   metoprolol tartrate (LOPRESSOR) 50 MG tablet TAKE 1 AND 1/2 TABLETS BY MOUTH TWICE DAILY (Patient taking differently: Take 75 mg by mouth 2 (two) times daily.)   nitroGLYCERIN (NITROSTAT) 0.4 MG SL tablet DISSOLVE 1 TABLET UNDER THE TONGUE EVERY 5 MINUTES AS NEEDED FOR CHEST PAIN. DO NOT EXCEED A TOTAL OF 3 DOSES IN 15 MINUTES. (Patient taking differently: Place 0.4 mg under the tongue every 5 (five) minutes as needed for chest pain.)   ranolazine (RANEXA) 1000 MG SR tablet Take 1 tablet (1,000 mg total) by mouth 2 (two) times daily.   rosuvastatin (CRESTOR) 40 MG tablet TAKE ONE TABLET BY MOUTH ONCE DAILY (Patient taking differently: Take 40 mg by mouth daily.)   terbinafine (LAMISIL AT) 1 % cream Apply 1 Application topically 2 (two) times daily.   traMADol (ULTRAM) 50 MG tablet Take 1 tablet (50 mg total) by mouth every 6 (six) hours as needed for moderate pain.   VASCEPA 1 g capsule Take 2 capsules (2 g total) by mouth 2 times daily at 12 noon and 4 pm. (Patient taking differently: Take 4 g by mouth 2 (two) times daily.)   Vitamin D, Ergocalciferol, (DRISDOL) 50000 units CAPS capsule Take 50,000 Units by mouth every Sunday.      Allergies:   Penicillins, Hydrocodone, Oxycodone, Zolpidem, and Januvia [sitagliptin]   Social History   Socioeconomic History   Marital status: Married    Spouse name: Not on file   Number of children: Not on file   Years of education: Not on file   Highest education level: Not on file  Occupational History   Occupation: retired    Comment: Journalist, newspaper  Tobacco Use   Smoking status: Former    Current packs/day: 0.00    Average  packs/day: 1 pack/day for 35.0 years (35.0 ttl pk-yrs)    Types: Cigarettes    Start date: 02/06/1966    Quit date: 02/06/2001    Years since quitting: 22.3   Smokeless tobacco: Never  Vaping Use   Vaping status: Never Used  Substance and Sexual Activity   Alcohol use: Not Currently    Alcohol/week: 4.0 standard drinks of alcohol    Types: 4 Cans of beer per week   Drug use: Never   Sexual activity: Not Currently  Other Topics Concern   Not on file  Social  History Narrative   Right handed   Lives at home in a one story home with wife    Retired    Journalist, newspaper    Social Drivers of Corporate investment banker Strain: Not on file  Food Insecurity: Low Risk  (10/26/2022)   Received from [facility]   Hunger Vital Sign    Worried About Running Out of Food in the Last Year: Never true    Ran Out of Food in the Last Year: Never true  Transportation Needs: Not on file (10/26/2022)  Physical Activity: Not on file  Stress: Not on file  Social Connections: Not on file     Family History: The patient's family history includes Cancer in his father; Diabetes in his mother; Suicidality in his brother. ROS:   Please see the history of present illness.    All 14 point review of systems negative except as described per history of present illness  EKGs/Labs/Other Studies Reviewed:    EKG Interpretation Date/Time:  Wednesday May 26 2023 08:52:45 EST Ventricular Rate:  84 PR Interval:  128 QRS Duration:  80 QT Interval:  358 QTC Calculation: 423 R Axis:   -20  Text Interpretation: Normal sinus rhythm Nonspecific ST abnormality Abnormal ECG When compared with ECG of 03-Mar-2018 06:33, No significant change was found Confirmed by Gypsy Balsam [phone]) on 05/26/2023 9:02:06 AM    Recent Labs: 02/23/2023: BUN 13; Creatinine, Ser 1.22; Hemoglobin 12.3; Platelets 115; Potassium 3.8; Sodium 140  Recent Lipid Panel    Component Value Date/Time   CHOL 116 07/25/2019 0813    TRIG 239 (H) 07/25/2019 0813   HDL 35 (L) 07/25/2019 0813   CHOLHDL 3.3 07/25/2019 0813   CHOLHDL 4.3 09/09/2017 0352   VLDL 75 (H) 09/09/2017 0352   LDLCALC 43 07/25/2019 0813   LDLDIRECT 45 07/25/2019 0813    Physical Exam:    VS:  BP 106/70 (BP Location: Left Arm, Patient Position: Sitting)   Pulse 84   Ht 5\' 5"  (1.651 m)   Wt 193 lb 12.8 oz (87.9 kg)   SpO2 95%   BMI 32.25 kg/m     Wt Readings from Last 3 Encounters:  05/26/23 193 lb 12.8 oz (87.9 kg)  02/23/23 185 lb (83.9 kg)  02/18/23 190 lb 11.2 oz (86.5 kg)     GEN:  Well nourished, well developed in no acute distress HEENT: Normal NECK: No JVD; No carotid bruits LYMPHATICS: No lymphadenopathy CARDIAC: RRR, no murmurs, no rubs, no gallops RESPIRATORY:  Clear to auscultation without rales, wheezing or rhonchi  ABDOMEN: Soft, non-tender, non-distended MUSCULOSKELETAL:  No edema; No deformity  SKIN: Warm and dry LOWER EXTREMITIES: no swelling NEUROLOGIC:  Alert and oriented x 3 PSYCHIATRIC:  Normal affect   ASSESSMENT:    1. Pre-op evaluation   2. Coronary artery disease of native artery of native heart with stable angina pectoris (HCC)   3. Obstructive sleep apnea   4. Dyslipidemia   5. Prediabetes    PLAN:    In order of problems listed above:  Cardiovascular preop evaluation for left wrist surgery will be done the local and seizure therefore there is no contraindication from cardiac point review to pursue the surgical intervention.  If antiplatelets therapy need to be withheld for a few days before that should be acceptable with understandably some risk for that. Coronary disease stable denies have any symptoms that would suggest reactivation of the problem. Dyslipidemia I did review K PN which show me his  LDL 45 HDL 35 good cholesterol control continue present management. Prediabetes new diagnosis.  He was working hard trying to prevent full diabetes from developing.   Medication Adjustments/Labs and  Tests Ordered: Current medicines are reviewed at length with the patient today.  Concerns regarding medicines are outlined above.  Orders Placed This Encounter  Procedures   EKG 12-Lead   Medication changes: No orders of the defined types were placed in this encounter.   Signed, Georgeanna Lea, MD, [facility] 05/26/2023 9:23 AM    [facility]

## 2023-06-28 ENCOUNTER — Other Ambulatory Visit: Payer: Self-pay | Admitting: Cardiology

## 2023-07-10 ENCOUNTER — Other Ambulatory Visit: Payer: Self-pay | Admitting: Cardiology

## 2023-07-12 NOTE — Telephone Encounter (Signed)
 Medication sent following the information below:  Patient Message Open 01/06/2023 [facility], Generic Provider   All Conversations: Plavix (Newest Message First)Harris, Belinda Fisher, RN to Robert Ramos      01/06/23  5:13 PM Hey Mr. Applegate, I got a message from [place] about your Plavix. Dr. Bing Matter had wanted you to continue the Plavix. In his note in April he said "Will continue dual antiplatelet therapy since he is high risk " Please let me know which pharmacy you prefer and I will send in your refills. Thank you! Dede, RN  This MyChart message has not been read.

## 2023-07-13 ENCOUNTER — Ambulatory Visit: Admitting: Podiatry

## 2023-07-30 DIAGNOSIS — G5603 Carpal tunnel syndrome, bilateral upper limbs: Secondary | ICD-10-CM | POA: Insufficient documentation

## 2023-09-09 ENCOUNTER — Other Ambulatory Visit (HOSPITAL_COMMUNITY): Payer: Self-pay | Admitting: Neurological Surgery

## 2023-09-09 ENCOUNTER — Other Ambulatory Visit: Payer: Self-pay | Admitting: Neurological Surgery

## 2023-09-09 DIAGNOSIS — M48062 Spinal stenosis, lumbar region with neurogenic claudication: Secondary | ICD-10-CM

## 2023-09-27 ENCOUNTER — Other Ambulatory Visit: Payer: Self-pay | Admitting: Cardiology

## 2023-10-20 ENCOUNTER — Ambulatory Visit (HOSPITAL_COMMUNITY)
Admission: RE | Admit: 2023-10-20 | Discharge: 2023-10-20 | Disposition: A | Discharge status: Home / Self Care | Source: Ambulatory Visit | Attending: Neurological Surgery | Admitting: Neurological Surgery

## 2023-10-20 ENCOUNTER — Other Ambulatory Visit: Payer: Self-pay

## 2023-10-20 DIAGNOSIS — Z981 Arthrodesis status: Secondary | ICD-10-CM | POA: Insufficient documentation

## 2023-10-20 DIAGNOSIS — M48061 Spinal stenosis, lumbar region without neurogenic claudication: Secondary | ICD-10-CM | POA: Diagnosis not present

## 2023-10-20 DIAGNOSIS — M48062 Spinal stenosis, lumbar region with neurogenic claudication: Secondary | ICD-10-CM

## 2023-10-20 MED ORDER — KETOROLAC TROMETHAMINE 15 MG/ML IJ SOLN: 15.0000 mg | Freq: Four times a day (QID) | INTRAMUSCULAR | Status: DC

## 2023-10-20 MED ORDER — DIAZEPAM 5 MG PO TABS: 10.0000 mg | ORAL_TABLET | Freq: Once | ORAL | Status: DC

## 2023-10-20 MED ORDER — IOHEXOL 180 MG/ML  SOLN
10.0000 mL | Freq: Once | INTRAMUSCULAR | Status: DC | PRN
  Administered 2023-10-20: 12 mL via INTRATHECAL

## 2023-10-20 MED ORDER — ONDANSETRON HCL 4 MG/2ML IJ SOLN: 4.0000 mg | Freq: Four times a day (QID) | INTRAMUSCULAR | Status: DC | PRN

## 2023-10-20 MED ORDER — LIDOCAINE HCL (PF) 1 % IJ SOLN
2.0000 mL | Freq: Once | INTRAMUSCULAR | Status: AC
  Administered 2023-10-20: 2 mL via INTRADERMAL

## 2023-10-20 NOTE — Progress Notes (Signed)
 Patient is ready for procedure. No orders for consent, held valium .

## 2023-10-20 NOTE — Procedures (Signed)
 Patient is a 73 year old individual who has had significant spondylitic disease with stenosis at L3-4 L4-5 L5-S1.  He has been having increasing recurrent symptoms difficulty walking even brief distances because of the amount of hardware that is in his spine have advised a myelogram and postmyelogram CAT scan which is now being performed.  Pre op Dx: Lumbar spinal stenosis with neurogenic claudication Post op Dx: Same Procedure: Lumbar myelogram Surgeon: Kainalu Heggs Puncture level: L2-3 Fluid color: Clear colorless Injection: Iohexol  180, 12 mL Findings: Stenosis at L2-3 and T12-L1 with evidence of conus compression.  Further evaluation with CT scanning from T10-S1

## 2023-10-20 NOTE — Progress Notes (Signed)
 Per Dr. Colon, patient can resume aspirin  tomorrow. Patient and wife informed and this information was placed on AVS.

## 2023-11-10 ENCOUNTER — Other Ambulatory Visit: Payer: Self-pay | Admitting: Neurological Surgery

## 2023-11-11 ENCOUNTER — Encounter (HOSPITAL_COMMUNITY): Payer: Self-pay | Admitting: Neurological Surgery

## 2023-11-11 ENCOUNTER — Telehealth: Payer: Self-pay

## 2023-11-11 ENCOUNTER — Other Ambulatory Visit: Payer: Self-pay

## 2023-11-11 NOTE — Telephone Encounter (Signed)
   Name: Robert Ramos  DOB: [birth year]  MRN: [id number]  Primary Cardiologist: Lamar Fitch, MD   Preoperative team, please contact this patient and set up a phone call appointment for further preoperative risk assessment. Please obtain consent and complete medication review. Thank you for your help.  I confirm that guidance regarding antiplatelet and oral anticoagulation therapy has been completed and, if necessary, noted below.  His aspirin  and Plavix  may be held for 5 to 7 days prior to his procedure.  Please resume as soon as hemostasis is achieved at the discretion of the surgeon.  I also confirmed the patient resides in the state of [place] . As per [facility] [employer] telemedicine laws, the patient must reside in the state in which the provider is licensed.   [name], NP 11/11/2023, 9:11 AM [facility]

## 2023-11-11 NOTE — Progress Notes (Unsigned)
 Virtual Visit via Telephone Note   Because of Brevin Mcfadden Briones co-morbid illnesses, he is at least at moderate risk for complications without adequate follow up.  This format is felt to be most appropriate for this patient at this time.  Due to technical limitations with video connection (technology), today's appointment will be conducted as an audio only telehealth visit, and Moss L Comp verbally agreed to proceed in this manner.   All issues noted in this document were discussed and addressed.  No physical exam could be performed with this format.  Evaluation Performed:  Preoperative cardiovascular risk assessment _____________   Date:  11/11/2023   Patient ID:  Ozell CROME Guirguis, DOB 08/27/1950, MRN [id number] Patient Location:  Home Provider location:   Office  Primary Care Provider:  Thurmond Cathlyn LABOR., MD Primary Cardiologist:  Lamar Fitch, MD  Chief Complaint / Patient Profile   73 y.o. y/o male with a h/o coronary artery disease, OSA, hyperlipidemia who is pending lumbar fusion and presents today for telephonic preoperative cardiovascular risk assessment.  History of Present Illness    Darrion Wyszynski Bosch is a 73 y.o. male who presents via audio/video conferencing for a telehealth visit today.  Pt was last seen in cardiology clinic on 05/26/2023 by Dr. Fitch.  At that time Ryen Rhames Sporrer was doing well .  The patient is now pending procedure as outlined above. Since his last visit, he continues to be stable from a cardiac standpoint.  Today he denies chest pain, shortness of breath, lower extremity edema, fatigue, palpitations, melena, hematuria, hemoptysis, diaphoresis, weakness, presyncope, syncope, orthopnea, and PND.   Past Medical History    Past Medical History:  Diagnosis Date   Anemia 09/28/2019   Arthritis    hands, back (03/02/2018)   ASHD (arteriosclerotic heart disease) 08/27/2015   B12 deficiency 05/21/2022   Body mass index (BMI)  34.0-34.9, adult 03/15/2019   Chronic bilateral low back pain with bilateral sciatica 01/22/2020   Chronic pain of left knee 09/28/2019   Chronic pain syndrome 01/22/2020   Chronic radicular low back pain    Coronary artery disease    a. MI in 2002 s/p stenting to mRCA b.cath 11/2016 s/p DES to mLAD c. Cath 08/2017- patent LAD stent, 40% instent restenosis of mRCA, 99% posterolateral artery s/p PTCA & DES.   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   Kidney stone 08/27/2015   Long-term use of high-risk medication 08/27/2015   Lumbago with sciatica, right side 01/22/2020   Lumbar foraminal stenosis 12/06/2020   Lumbar post-laminectomy syndrome 06/08/2019   Lumbar stenosis with neurogenic claudication 11/11/2021   Malaise and fatigue 08/27/2015   Mixed hyperlipidemia 08/27/2015   Non-seasonal allergic rhinitis 07/04/2018   Obstructive sleep apnea 11/17/2016   Pneumonia    Precordial chest pain 11/24/2017   Prediabetes 08/27/2015   Radiculopathy, lumbar region 03/15/2019   Shortness of breath 11/17/2016   Spinal stenosis, 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   Stage 3 chronic kidney disease (HCC) 08/27/2015   Patient unaware.  Creatine on 11/2021 was 1.27.  Tremor 05/30/2020   Past Surgical History:  Procedure Laterality Date   ANTERIOR LAT LUMBAR FUSION N/A 02/23/2023   Procedure: extreme Lateral Interbody Fusion - Lumbar two-Lumbar three - Interbody Fusion;  Surgeon: Colon Shove, MD;  Location: [facility] OR;  Service: Neurosurgery;  Laterality: N/A;   APPENDECTOMY  1974   BACK  SURGERY     CORONARY ANGIOPLASTY WITH STENT PLACEMENT  2002; 11/20/2016   @ [facility]; @ [facility]   CORONARY CTO INTERVENTION N/A 03/02/2018   Procedure: CORONARY CTO INTERVENTION - Right;  Surgeon: Swaziland, Peter M, MD;  Location: [facility] INVASIVE CV LAB;  Service: Cardiovascular;  Laterality: N/A;   CORONARY PRESSURE/FFR STUDY N/A 09/16/2017   Procedure: INTRAVASCULAR PRESSURE WIRE/FFR STUDY;  Surgeon: Claudene Shove ORN, MD;  Location: [facility] INVASIVE CV LAB;  Service: Cardiovascular;  Laterality: N/A;   CORONARY STENT INTERVENTION N/A 11/20/2016   Procedure: CORONARY STENT INTERVENTION;  Surgeon: Mady Bruckner, MD;  Location: [facility] INVASIVE CV LAB;  Service: Cardiovascular;  Laterality: N/A;   CORONARY STENT INTERVENTION N/A 09/09/2017   Procedure: CORONARY STENT INTERVENTION;  Surgeon: Verlin Bruckner BIRCH, MD;  Location: [facility] INVASIVE CV LAB;  Service: Cardiovascular;  Laterality: N/A;   CORONARY ULTRASOUND/IVUS N/A 11/20/2016   Procedure: Intravascular Ultrasound/IVUS;  Surgeon: Mady Bruckner, MD;  Location: [facility] INVASIVE CV LAB;  Service: Cardiovascular;  Laterality: N/A;   HEMI-MICRODISCECTOMY LUMBAR LAMINECTOMY LEVEL 1 Left 2008   L4   KNEE ARTHROSCOPY Left 1983   LEFT HEART CATH AND CORONARY ANGIOGRAPHY N/A 11/20/2016   Procedure: LEFT HEART CATH AND CORONARY ANGIOGRAPHY;  Surgeon: Mady Bruckner, MD;  Location: [facility] INVASIVE CV LAB;  Service: Cardiovascular;  Laterality: N/A;   LEFT HEART CATH AND CORONARY ANGIOGRAPHY N/A 09/09/2017   Procedure: LEFT HEART CATH AND CORONARY ANGIOGRAPHY;  Surgeon: Verlin Bruckner BIRCH, MD;  Location: [facility] INVASIVE CV LAB;  Service: Cardiovascular;  Laterality: N/A;   LEFT HEART CATH AND CORONARY ANGIOGRAPHY N/A 09/16/2017   Procedure: LEFT HEART CATH AND CORONARY ANGIOGRAPHY;  Surgeon: Claudene Shove ORN, MD;  Location: [facility] INVASIVE CV LAB;  Service: Cardiovascular;  Laterality: N/A;   LEFT HEART CATH AND CORONARY ANGIOGRAPHY N/A 02/21/2018   Procedure: LEFT  HEART CATH AND CORONARY ANGIOGRAPHY;  Surgeon: Wonda Sharper, MD;  Location: [facility] INVASIVE CV LAB;  Service: Cardiovascular;  Laterality: N/A;   LUMBAR FUSION  12/11/2020   L1-2-3-4   LUMBAR LAMINECTOMY/DECOMPRESSION MICRODISCECTOMY N/A 11/11/2021   Procedure: LUMBAR THREE-FOUR SUBLAMINAR DECOMPRESSION;  Surgeon: Colon Shove, MD;  Location: [facility] OR;  Service: Neurosurgery;  Laterality: N/A;   TONSILLECTOMY     ULTRASOUND GUIDANCE FOR VASCULAR ACCESS  03/02/2018   Procedure: Ultrasound Guidance For Vascular Access;  Surgeon: Swaziland, Peter M, MD;  Location: [facility] INVASIVE CV LAB;  Service: Cardiovascular;;    Allergies  Allergies  Allergen Reactions   Penicillins Anaphylaxis, Swelling and Rash   Hydrocodone Nausea Only    Sick on stomach all day long    Oxycodone  Nausea Only    Sick on stomach all day long    Zolpidem Other (See Comments)    dizziness   Januvia [Sitagliptin] Nausea And Vomiting and Rash    Full body rash    Home Medications    Prior to Admission medications   Medication Sig Start Date End Date Taking? Authorizing Provider  allopurinol  (ZYLOPRIM ) 100 MG tablet Take 100 mg by mouth daily. 01/20/18   [provider]  amLODipine  (NORVASC ) 10 MG tablet TAKE ONE TABLET BY MOUTH ONCE DAILY 09/28/23  Krasowski, Robert J, MD  aspirin  EC 81 MG tablet Take 1 tablet (81 mg total) by mouth daily. Patient not taking: Reported on 11/11/2023 11/17/16   Krasowski, Robert J, MD  ciclopirox  (PENLAC ) 8 % solution Apply topically at bedtime. Apply over nail and surrounding skin. Apply daily over previous coat. After seven (7) days, may remove with alcohol and continue cycle. Patient taking differently: Apply 1 Application topically at bedtime. Apply over nail and surrounding skin. Apply daily over previous coat. After seven (7) days, may remove with alcohol and continue cycle. 01/20/22   Standiford, Marsa FALCON, DPM  clopidogrel  (PLAVIX ) 75 MG tablet TAKE ONE TABLET BY MOUTH ONCE DAILY  WITH BREAKFAST Patient not taking: Reported on 11/11/2023 07/12/23   Krasowski, Robert J, MD  cyanocobalamin (,VITAMIN B-12,) 1000 MCG/ML injection Inject 1,000 mcg into the muscle every 30 (thirty) days. 07/31/21   [provider]  dexamethasone  (DECADRON ) 1 MG tablet 2 tablets twice daily for 2 days, one tablet twice daily for 2 days, one tablet daily for 2 days. Patient taking differently: Take 1 mg by mouth See admin instructions. 2 tablets twice daily for 2 days, one tablet twice daily for 2 days, one tablet daily for 2 days. 02/24/23   Colon Shove, MD  diphenhydrAMINE  HCl, Sleep, (ZZZQUIL) 50 MG/30ML LIQD Take 50 mg by mouth at bedtime as needed (sleep).    [provider]  HYDROmorphone  (DILAUDID ) 2 MG tablet Take 1 tablet (2 mg total) by mouth every 4 (four) hours as needed for severe pain (pain score 7-10). 02/24/23   Colon Shove, MD  isosorbide  mononitrate (IMDUR ) 60 MG 24 hr tablet Take 1 tablet (60 mg total) by mouth daily. 01/25/23   Krasowski, Robert J, MD  methocarbamol  (ROBAXIN ) 500 MG tablet Take 1 tablet (500 mg total) by mouth every 6 (six) hours as needed for muscle spasms. 11/12/21   Colon Shove, MD  methocarbamol  (ROBAXIN ) 500 MG tablet Take 1 tablet (500 mg total) by mouth every 6 (six) hours as needed for muscle spasms. 02/24/23   Colon Shove, MD  metoprolol  tartrate (LOPRESSOR ) 50 MG tablet TAKE 1 AND 1/2 TABLETS BY MOUTH TWICE DAILY Patient taking differently: Take 75 mg by mouth 2 (two) times daily. 01/25/23   Krasowski, Robert J, MD  nitroGLYCERIN  (NITROSTAT ) 0.4 MG SL tablet DISSOLVE 1 TABLET UNDER THE TONGUE EVERY 5 MINUTES AS NEEDED FOR CHEST PAIN. DO NOT EXCEED A TOTAL OF 3 DOSES IN 15 MINUTES. Patient taking differently: Place 0.4 mg under the tongue every 5 (five) minutes as needed for chest pain. 09/02/22   Krasowski, Robert J, MD  ranolazine  (RANEXA ) 1000 MG SR tablet Take 1 tablet (1,000 mg total) by mouth 2 (two) times daily. 09/28/23    Krasowski, Robert J, MD  rosuvastatin  (CRESTOR ) 40 MG tablet TAKE ONE TABLET BY MOUTH ONCE DAILY 06/28/23   Krasowski, Robert J, MD  terbinafine  (LAMISIL  AT) 1 % cream Apply 1 Application topically 2 (two) times daily. 01/20/22   Standiford, Marsa FALCON, DPM  traMADol  (ULTRAM ) 50 MG tablet Take 1 tablet (50 mg total) by mouth every 6 (six) hours as needed for moderate pain. 11/12/21   Colon Shove, MD  VASCEPA  1 g capsule Take 2 capsules (2 g total) by mouth 2 times daily at 12 noon and 4 pm. Patient taking differently: Take 4 g by mouth 2 (two) times daily. 10/08/20   Hilty, Vinie BROCKS, MD  Vitamin D , Ergocalciferol , (DRISDOL ) 50000 units CAPS capsule Take 50,000 Units by  mouth every Sunday.  08/02/17   [provider]    Physical Exam    Vital Signs:  Ozell CROME Dzik does not have vital signs available for review today.  Given telephonic nature of communication, physical exam is limited. AAOx3. NAD. Normal affect.  Speech and respirations are unlabored.  Accessory Clinical Findings    None  Assessment & Plan    1.  Preoperative Cardiovascular Risk Assessment:Lumbar Fusion                              Surgeon: Dr. Victory Gens  Surgeon's Group or Practice Name:  [facility] Phone number:  [phone] Fax number:  [phone]      Primary Cardiologist: Lamar Fitch, MD  Chart reviewed as part of pre-operative protocol coverage. Given past medical history and time since last visit, based on ACC/AHA guidelines, Naftula L Winebarger would be at acceptable risk for the planned procedure without further cardiovascular testing.   His RCRI is low risk, 0.9% risk of major cardiac event.  He is able to complete greater than 4 METS of physical activity.  Patient was advised that if he develops new symptoms prior to surgery to contact our office to arrange a follow-up appointment.  He verbalized understanding.  His aspirin  and Plavix  may be  held for 5 to 7 days prior to his procedure. Please resume as soon as hemostasis is achieved at the discretion of the surgeon.   I will route this recommendation to the requesting party via Epic fax function and remove from pre-op pool.      Time:   Today, I have spent 5 minutes with the patient with telehealth technology discussing medical history, symptoms, and management plan.  I spent 10 minutes reviewing patient's past cardiac history and cardiac medications.\   [name], NP  11/11/2023, 3:21 PM

## 2023-11-11 NOTE — Telephone Encounter (Signed)
   Pre-operative Risk Assessment    Patient Name: Robert Ramos  DOB: 1950-05-29 MRN: [id number]   Date of last office visit: 05/26/2023 Date of next office visit: None   Request for Surgical Clearance    Procedure:  Lumbar Fusion  Date of Surgery:  Clearance STAT                               Surgeon: Dr. Victory Gens  Surgeon's Group or Practice Name:  [facility] Phone number:  [phone] Fax number:  [phone]   Type of Clearance Requested:   Medical and Pharmacy, meds not specified   Type of Anesthesia:  General    Additional requests/questions:    Signed, Yanixan Mellinger M Leoncio Hansen   11/11/2023, 6:48 AM

## 2023-11-11 NOTE — Telephone Encounter (Signed)
 Spoke to patient, he will have a tele visit 11/12/23 with Josefa Beauvais, NP.     Patient Consent for Virtual Visit        Robert Ramos has provided verbal consent on 11/11/2023 for a virtual visit (video or telephone).   CONSENT FOR VIRTUAL VISIT FOR:  Robert Ramos  By participating in this virtual visit I agree to the following:  I hereby voluntarily request, consent and authorize [facility] and its employed or contracted physicians, physician assistants, nurse practitioners or other licensed health care professionals (the Practitioner), to provide me with telemedicine health care services (the "Services) as deemed necessary by the treating Practitioner. I acknowledge and consent to receive the Services by the Practitioner via telemedicine. I understand that the telemedicine visit will involve communicating with the Practitioner through live audiovisual communication technology and the disclosure of certain medical information by electronic transmission. I acknowledge that I have been given the opportunity to request an in-person assessment or other available alternative prior to the telemedicine visit and am voluntarily participating in the telemedicine visit.  I understand that I have the right to withhold or withdraw my consent to the use of telemedicine in the course of my care at any time, without affecting my right to future care or treatment, and that the Practitioner or I may terminate the telemedicine visit at any time. I understand that I have the right to inspect all information obtained and/or recorded in the course of the telemedicine visit and may receive copies of available information for a reasonable fee.  I understand that some of the potential risks of receiving the Services via telemedicine include:  Delay or interruption in medical evaluation due to technological equipment failure or disruption; Information transmitted may not be sufficient (e.g. poor  resolution of images) to allow for appropriate medical decision making by the Practitioner; and/or  In rare instances, security protocols could fail, causing a breach of personal health information.  Furthermore, I acknowledge that it is my responsibility to provide information about my medical history, conditions and care that is complete and accurate to the best of my ability. I acknowledge that Practitioner's advice, recommendations, and/or decision may be based on factors not within their control, such as incomplete or inaccurate data provided by me or distortions of diagnostic images or specimens that may result from electronic transmissions. I understand that the practice of medicine is not an exact science and that Practitioner makes no warranties or guarantees regarding treatment outcomes. I acknowledge that a copy of this consent can be made available to me via my patient portal [facility] MyChart), or I can request a printed copy by calling the office of [facility].    I understand that my insurance will be billed for this visit.   I have read or had this consent read to me. I understand the contents of this consent, which adequately explains the benefits and risks of the Services being provided via telemedicine.  I have been provided ample opportunity to ask questions regarding this consent and the Services and have had my questions answered to my satisfaction. I give my informed consent for the services to be provided through the use of telemedicine in my medical care

## 2023-11-11 NOTE — Progress Notes (Signed)
 SDW call  Patient was given pre-op instructions over the phone. Patient verbalized understanding of instructions provided.     PCP - Dr. Cathlyn Nash Cardiologist - Dr. Lamar Fitch, Clearance 11/11/2023 Pulmonary:    PPM/ICD - denies Device Orders - na Rep Notified - na   Chest x-ray - na EKG -  05/26/2023 Stress Test - 08/06/2020 ECHO -  06/30/2021 Cardiac Cath - 02/21/2018  Sleep Study/sleep apnea/CPAP: OSA, patient denies  Non-diabetic  Blood Thinner Instructions: Plavix , hold 5 days, states last dose 7/30 Aspirin  Instructions: hold 5 days, states last dose 7/30   ERAS Protcol - NPO   Anesthesia review: Yes. CAD, HTN, MI, CKD, OSA, HLD, cardiac clearance   Patient denies shortness of breath, fever, cough and chest pain over the phone call  Your procedure is scheduled on Monday November 15, 2023  Report to [facility] Main Entrance A at  0815  A.M., then check in with the Admitting office.  Call this number if you have problems the morning of surgery:  [phone]   If you have any questions prior to your surgery date call [phone]: Open Monday-Friday 8am-4pm If you experience any cold or flu symptoms such as cough, fever, chills, shortness of breath, etc. between now and your scheduled surgery, please notify us  at the above number    Remember:  Do not eat or drink after midnight the night before your surgery  Take these medicines the morning of surgery with A SIP OF WATER:  Allopurinol , amlodipine , crestor , decadron , isosorbide , metoprolol , ranexa   As needed: Robaxin , tramadol   As of today, STOP taking any Aleve, Naproxen, Ibuprofen, Motrin, Advil, Goody's, BC's, all herbal medications, fish oil, and all vitamins.

## 2023-11-12 ENCOUNTER — Ambulatory Visit: Attending: Cardiology

## 2023-11-12 DIAGNOSIS — Z01810 Encounter for preprocedural cardiovascular examination: Secondary | ICD-10-CM | POA: Diagnosis not present

## 2023-11-12 NOTE — Progress Notes (Signed)
 Anesthesia Chart Review:SAME DAY WORK-UP  Case: [id number] Date/Time: 11/15/23 1034   Procedure: POSTERIOR LUMBAR FUSION 2 LEVEL (Back) - PLIF L12, L23   Anesthesia type: General   Diagnosis: Spinal stenosis of lumbar region with neurogenic claudication [M48.062]   Pre-op diagnosis: SPINAL STENOSIS, LUMBAR REGION WITH NEUROGENIC CLAUDICATION   [facility] / [facility]   Surgeons: Colon Shove, MD       DISCUSSION: Patient is a 73 year old male scheduled for the above procedure.   History includes former smoker (quit 02/06/01), HTN, HLD, CAD (BMS dRCA 2002; DES prox/mid LAD 11/20/16; DES PLA 09/09/17; mid RCA 100% with unsuccessful PCI 03/02/18), OSA, CKD, prediabetes, anemia, tremor, GERD, dyspnea, spinal surgery (L4-S1 PLIF 12/06/20, L3-4 laminotomies 11/11/21; L3-decompression 02/24/23).     Preoperative cardiology input outlined by Emelia Hazy, NP on 11/12/23, Given past medical history and time since last visit, based on ACC/AHA guidelines, Robert Ramos would be at acceptable risk for the planned procedure without further cardiovascular testing.    His RCRI is low risk, 0.9% risk of major cardiac event.  He is able to complete greater than 4 METS of physical activity... His aspirin  and Plavix  may be held for 5 to 7 days prior to his procedure. Please resume as soon as hemostasis is achieved at the discretion of the surgeon.   He reported last ASA and Plavix  on 11/10/23.  Echo 06/30/21 showed EF 55 to 60%, no regional wall motion abnormalities, mild concentric LVH, grade 1 diastolic dysfunction, normal RV systolic function, normal PASP, mild MR, mild AR. Nuclear stress test 08/06/20 (prior to L4-S1 PLIF) was intermediate risk with medium defect in the inferior regions consistent with ischemia but per Dr. Bernie, Ischemia consistent with her known anatomy with completely occluded right coronary artery. Medical therapy was continued.    He is a same-day workup.  Labs and  anesthesia team evaluation on the day of surgery.   VS: Ht 5' 5 (1.651 m)   Wt 90.7 kg   BMI 33.28 kg/m  BP Readings from Last 3 Encounters:  10/20/23 114/71  05/26/23 106/70  02/24/23 112/73   Pulse Readings from Last 3 Encounters:  10/20/23 78  05/26/23 84  02/24/23 72     PROVIDERS: Thurmond Cathlyn LABOR., MD  is PCP Bernie Charleston, MD is cardiologist   LABS: He is for his on arrival as indicated.  As of 04/27/2023 ([facility]): Sodium 139, potassium 4.5, glucose 125, BUN 15, creatinine 1.17, alkaline phosphatase 74, total bilirubin 0.6, AST 20, ALT 12, calcium  9.9, A1c 5.9%. H/H 12.3/38.0, PLT 115K on 02/23/23.    IMAGES: CT L-spine 10/20/23: IMPRESSION: 1. Solid fusion across the disc space at L3-4, L4-5 and L5-S1 without residual or recurrent stenosis. 2. Moderate central canal and bilateral foraminal stenosis at L1-2. 3. Moderate left subarticular and foraminal stenosis at L2-3. 4. Mild right foraminal narrowing at L3-4. 5. Mild osseous foraminal narrowing on the left at L5-S1.    EKG: EKG 05/26/2023: Normal sinus rhythm Nonspecific ST abnormality Abnormal ECG When compared with ECG of 03-Mar-2018 06:33, No significant change was found Confirmed by Bernie Charleston [phone]) on 05/26/2023 9:02:06 AM   CV: US  Carotid 08/05/2021: Summary:  - Right Carotid: The extracranial vessels were near-normal with only minimal  wall thickening or plaque.  - Left Carotid: Velocities in the left ICA are consistent with a 1-39% stenosis.  - Vertebrals: Left vertebral artery demonstrates antegrade flow. Right vertebral artery was not visualized.  - Subclavians: Normal flow  hemodynamics were seen in bilateral subclavian arteries.      Echo 06/30/2021: IMPRESSIONS   1. Left ventricular ejection fraction, by estimation, is 55 to 60%. The  left ventricle has normal function. The left ventricle has no regional  wall motion abnormalities. There is mild concentric left ventricular   hypertrophy. Left ventricular diastolic  parameters are consistent with Grade I diastolic dysfunction (impaired  relaxation).   2. Right ventricular systolic function is normal. The right ventricular  size is normal. There is normal pulmonary artery systolic pressure.   3. The mitral valve is normal in structure. Mild mitral valve  regurgitation. No evidence of mitral stenosis.   4. The aortic valve is tricuspid. Aortic valve regurgitation is mild.  Aortic valve sclerosis is present, with no evidence of aortic valve  stenosis.   5. Aortic Normal DTA.   6. The inferior vena cava is normal in size with greater than 50%  respiratory variability, suggesting right atrial pressure of 3 mmHg.      Nuclear stress 08/06/2020: The left ventricular ejection fraction is normal (55-65%). Nuclear stress EF: 62%. There was no ST segment deviation noted during stress. Defect 1: There is a medium defect of mild severity present in the mid inferior, apical inferior and apical lateral location. Findings consistent with ischemia. This is an intermediate risk study.     EVENT MONITOR REPORT: 03/29/2018-04/27/2018:  Baseline rhythm: Sinus Atrial arrhythmia: None Ventricular arrhythmia: None Conduction abnormality: None Symptoms: 1 symptomatic event which shows sinus rhythm Conclusion:  Only one triggered event showing normal sinus rhythm     Cardiac cath 03/02/2018: Non-stenotic Post Atrio lesion was previously treated. Mid RCA-2 lesion is 100% stenosed. Mid RCA to Dist RCA lesion is 100% stenosed. Post PCI there remains 100% occlusion   1. Unsuccessful CTO PCI of the RCA. Unable to cross lesion with a wire.    Plan: would continue medical therapy. I don't feel further attempts at CTO PCI would be advisable.    Past Medical History:  Diagnosis Date   Anemia 09/28/2019   Arthritis    hands, back (03/02/2018)   ASHD (arteriosclerotic heart disease) 08/27/2015   B12 deficiency 05/21/2022    Body mass index (BMI) 34.0-34.9, adult 03/15/2019   Chronic bilateral low back pain with bilateral sciatica 01/22/2020   Chronic pain of left knee 09/28/2019   Chronic pain syndrome 01/22/2020   Chronic radicular low back pain    Coronary artery disease    a. MI in 2002 s/p stenting to mRCA b.cath 11/2016 s/p DES to mLAD c. Cath 08/2017- patent LAD stent, 40% instent restenosis of mRCA, 99% posterolateral artery s/p PTCA & DES.   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   Kidney stone 08/27/2015   Long-term use of high-risk medication 08/27/2015   Lumbago with sciatica, right side 01/22/2020   Lumbar foraminal stenosis 12/06/2020   Lumbar post-laminectomy syndrome 06/08/2019   Lumbar stenosis with neurogenic claudication 11/11/2021   Malaise and fatigue 08/27/2015   Mixed hyperlipidemia 08/27/2015   Non-seasonal allergic rhinitis 07/04/2018   Obstructive sleep apnea  11/17/2016   Pneumonia    Precordial chest pain 11/24/2017   Prediabetes 08/27/2015   Radiculopathy, lumbar region 03/15/2019   Shortness of breath 11/17/2016   Spinal stenosis, 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   Stage 3 chronic kidney disease (HCC) 08/27/2015   Patient unaware.  Creatine on 11/2021 was 1.27.   Tremor 05/30/2020    Past Surgical History:  Procedure Laterality Date   ANTERIOR LAT LUMBAR FUSION N/A 02/23/2023   Procedure: extreme Lateral Interbody Fusion - Lumbar two-Lumbar three - Interbody Fusion;  Surgeon: Colon Shove, MD;  Location: [facility];  Service: Neurosurgery;  Laterality: N/A;    APPENDECTOMY  1974   BACK SURGERY     CORONARY ANGIOPLASTY WITH STENT PLACEMENT  2002; 11/20/2016   @ [facility]; @ [facility]   CORONARY CTO INTERVENTION N/A 03/02/2018   Procedure: CORONARY CTO INTERVENTION - Right;  Surgeon: Swaziland, Peter M, MD;  Location: [facility] INVASIVE CV LAB;  Service: Cardiovascular;  Laterality: N/A;   CORONARY PRESSURE/FFR STUDY N/A 09/16/2017   Procedure: INTRAVASCULAR PRESSURE WIRE/FFR STUDY;  Surgeon: Claudene Shove ORN, MD;  Location[facility] INVAS[facility] CV LAB;  Service: Cardiovascular;  Laterality: N/A;   CORONARY STENT INTERVENTION N/A 11/20/2016   Procedure: CORONARY STENT INTERVENTION;  Surgeon: Mady Bruckner, MD;  [facility] INVAS[facility] CV LAB;  Service: Cardiovascular;  Laterality: N/A;   CORONARY STENT INTERVENTION N/A 09/09/2017   Procedure: CORONARY STENT INTERVENTION;  Surgeon: Verlin Bruckner BIRCH, MD;  [facility]VAS[facility] CV LAB;  Service: Cardiovascular;  Laterality: N/A;   CORONARY ULTRASOUND/IVUS N/A 11/20/2016   Procedure: Intravascular Ultrasound/IVUS;  Surgeon: Mady Bruckner, MD;  [facility] CV [facility];  Service: Cardiovascular;  Laterality: N/A;   HEMI-MICRODISCECTOMY LUMBAR LAMINECTOMY LEVEL 1 Left 2008   L4   KNEE ARTHROSCOPY Left 1983   LEFT HEART CATH AND CORONARY ANGIOGRAPHY N/A 11/20/2016   Procedure: LEFT HEART CATH AND CORONARY ANGIOGRAPHY;  Surgeon: Mady Bruckner, MD;  [facility];  Service: Cardiovascular;  Laterality: N/A;   LEFT HEART CATH AND CORONARY ANGIOGRAPHY N/A 09/09/2017   Procedure: LEFT HEART CATH AND CORONARY ANGIOGRAPHY;  Surgeon: Verlin Bruckner BIRCH, MD;  [facility];  Service: Cardiovascular;  Laterality: N/A;   LEFT HEART CATH AND CORONARY ANGIOGRAPHY N/A 09/16/2017   Procedure: LEFT HEART CATH AND CORONARY ANGIOGRAPHY;  Surgeon: Claudene Shove ORN, MD;  [facility] CV [facility];  Service: Cardiovascular;  Laterality: N/A;   LEFT HEART CATH AND CORONARY ANGIOGRAPHY N/A  02/21/2018   Procedure: LEFT HEART CATH AND CORONARY ANGIOGRAPHY;  Surgeon: Wonda Sharper, MD;  Location: [facility] INVASIVE CV LAB;  Service: Cardiovascular;  Laterality: N/A;   LUMBAR FUSION  12/11/2020   L1-2-3-4   LUMBAR LAMINECTOMY/DECOMPRESSION MICRODISCECTOMY N/A 11/11/2021   Procedure: LUMBAR THREE-FOUR SUBLAMINAR DECOMPRESSION;  Surgeon: Colon Shove, MD;  [facility];  Service: Neurosurgery;  Laterality: N/A;   TONSILLECTOMY     ULTRASOUND GUIDANCE FOR VASCULAR ACCESS  03/02/2018   Procedure: Ultrasound Guidance For Vascular Access;  Surgeon: Swaziland, Peter M, MD;  Location: [facility] INVASIVE CV LAB;  Service: Cardiovascular;;    MEDICATIONS: No current facility-administered medications for this encounter.    allopurinol  (ZYLOPRIM ) 100 MG tablet   amLODipine  (NORVASC ) 10 MG tablet   aspirin  EC 81 MG tablet   clopidogrel  (PLAVIX ) 75 MG tablet   cyanocobalamin (,VITAMIN B-12,) 1000 MCG/ML injection   gabapentin  (NEURONTIN ) 300 MG capsule   glipiZIDE (GLUCOTROL XL) 5 MG 24 hr tablet   isosorbide  mononitrate (  IMDUR ) 60 MG 24 hr tablet   methocarbamol  (ROBAXIN ) 500 MG tablet   metoprolol  tartrate (LOPRESSOR ) 50 MG tablet   naproxen (NAPROSYN) 500 MG tablet   nitroGLYCERIN  (NITROSTAT ) 0.4 MG SL tablet   ranolazine  (RANEXA ) 1000 MG SR tablet   rosuvastatin  (CRESTOR ) 40 MG tablet   tiZANidine (ZANAFLEX) 4 MG capsule   VASCEPA  1 g capsule   Vitamin D , Ergocalciferol , (DRISDOL ) 50000 units CAPS capsule    Isaiah Ruder, PA-C Surgical Short Stay/Anesthesiology [facility] Phone [phone] [facility] Phone [phone] 11/12/2023 4:30 PM

## 2023-11-12 NOTE — Anesthesia Preprocedure Evaluation (Addendum)
 Anesthesia Evaluation  Patient identified by MRN, date of birth, ID band Patient awake    Reviewed: Allergy & Precautions, NPO status , Patient's Chart, lab work & pertinent test results, reviewed documented beta blocker date and time   History of Anesthesia Complications Negative for: history of anesthetic complications  Airway Mallampati: II  TM Distance: >3 FB     Dental  (+) Edentulous Upper   Pulmonary shortness of breath and with exertion, sleep apnea , pneumonia, Current Smoker and Patient abstained from smoking.   breath sounds clear to auscultation       Cardiovascular hypertension, (-) angina + CAD and + Past MI   Rhythm:Regular Rate:Normal  IMPRESSIONS     1. Left ventricular ejection fraction, by estimation, is 55 to 60%. The  left ventricle has normal function. The left ventricle has no regional  wall motion abnormalities. There is mild concentric left ventricular  hypertrophy. Left ventricular diastolic  parameters are consistent with Grade I diastolic dysfunction (impaired  relaxation).   2. Right ventricular systolic function is normal. The right ventricular  size is normal. There is normal pulmonary artery systolic pressure.   3. The mitral valve is normal in structure. Mild mitral valve  regurgitation. No evidence of mitral stenosis.   4. The aortic valve is tricuspid. Aortic valve regurgitation is mild.  Aortic valve sclerosis is present, with no evidence of aortic valve  stenosis.   5. Aortic Normal DTA.   6. The inferior vena cava is normal in size with greater than 50%  respiratory variability, suggesting right atrial pressure of 3 mmHg.      Neuro/Psych  Neuromuscular disease    GI/Hepatic ,GERD  ,,  Endo/Other    Renal/GU CRFRenal disease     Musculoskeletal  (+) Arthritis ,    Abdominal   Peds  Hematology  (+) Blood dyscrasia, anemia   Anesthesia Other Findings    Reproductive/Obstetrics                              Anesthesia Physical Anesthesia Plan  ASA: 3  Anesthesia Plan: General   Post-op Pain Management:    Induction: Intravenous  PONV Risk Score and Plan: 2 and Ondansetron  and Dexamethasone   Airway Management Planned: Oral ETT  Additional Equipment:   Intra-op Plan:   Post-operative Plan: Extubation in OR  Informed Consent: I have reviewed the patients History and Physical, chart, labs and discussed the procedure including the risks, benefits and alternatives for the proposed anesthesia with the patient or authorized representative who has indicated his/her understanding and acceptance.     Dental advisory given  Plan Discussed with: CRNA  Anesthesia Plan Comments: (PAT note written 11/12/2023 by Allison Zelenak, PA-C.  )         Anesthesia Quick Evaluation

## 2023-11-15 ENCOUNTER — Ambulatory Visit (HOSPITAL_COMMUNITY): Payer: Worker's Compensation | Admitting: Vascular Surgery

## 2023-11-15 ENCOUNTER — Ambulatory Visit (HOSPITAL_COMMUNITY)

## 2023-11-15 ENCOUNTER — Inpatient Hospital Stay (HOSPITAL_COMMUNITY)
Admission: RE | Admit: 2023-11-15 | Discharge: 2023-11-20 | DRG: 982 | Disposition: A | Discharge status: Home / Self Care | Payer: Worker's Compensation | Attending: Neurological Surgery | Admitting: Neurological Surgery

## 2023-11-15 ENCOUNTER — Encounter (HOSPITAL_COMMUNITY)
Admission: RE | Disposition: A | Discharge status: Home / Self Care | Payer: Self-pay | Source: Home / Self Care | Attending: Neurological Surgery

## 2023-11-15 DIAGNOSIS — Z7982 Long term (current) use of aspirin: Secondary | ICD-10-CM

## 2023-11-15 DIAGNOSIS — M5416 Radiculopathy, lumbar region: Secondary | ICD-10-CM | POA: Diagnosis present

## 2023-11-15 DIAGNOSIS — M48062 Spinal stenosis, lumbar region with neurogenic claudication: Secondary | ICD-10-CM | POA: Diagnosis not present

## 2023-11-15 DIAGNOSIS — Z880 Allergy status to penicillin: Secondary | ICD-10-CM

## 2023-11-15 DIAGNOSIS — Z79899 Other long term (current) drug therapy: Secondary | ICD-10-CM

## 2023-11-15 DIAGNOSIS — E1165 Type 2 diabetes mellitus with hyperglycemia: Secondary | ICD-10-CM | POA: Diagnosis present

## 2023-11-15 DIAGNOSIS — I129 Hypertensive chronic kidney disease with stage 1 through stage 4 chronic kidney disease, or unspecified chronic kidney disease: Secondary | ICD-10-CM

## 2023-11-15 DIAGNOSIS — Z981 Arthrodesis status: Secondary | ICD-10-CM

## 2023-11-15 DIAGNOSIS — I252 Old myocardial infarction: Secondary | ICD-10-CM

## 2023-11-15 DIAGNOSIS — Z7984 Long term (current) use of oral hypoglycemic drugs: Secondary | ICD-10-CM

## 2023-11-15 DIAGNOSIS — D696 Thrombocytopenia, unspecified: Secondary | ICD-10-CM | POA: Diagnosis not present

## 2023-11-15 DIAGNOSIS — G4733 Obstructive sleep apnea (adult) (pediatric): Secondary | ICD-10-CM | POA: Diagnosis present

## 2023-11-15 DIAGNOSIS — Z9889 Other specified postprocedural states: Secondary | ICD-10-CM

## 2023-11-15 DIAGNOSIS — N179 Acute kidney failure, unspecified: Secondary | ICD-10-CM | POA: Diagnosis present

## 2023-11-15 DIAGNOSIS — E66811 Obesity, class 1: Secondary | ICD-10-CM | POA: Diagnosis present

## 2023-11-15 DIAGNOSIS — Z87442 Personal history of urinary calculi: Secondary | ICD-10-CM

## 2023-11-15 DIAGNOSIS — G894 Chronic pain syndrome: Secondary | ICD-10-CM | POA: Diagnosis present

## 2023-11-15 DIAGNOSIS — Z8701 Personal history of pneumonia (recurrent): Secondary | ICD-10-CM

## 2023-11-15 DIAGNOSIS — D62 Acute posthemorrhagic anemia: Secondary | ICD-10-CM | POA: Diagnosis not present

## 2023-11-15 DIAGNOSIS — E785 Hyperlipidemia, unspecified: Secondary | ICD-10-CM | POA: Diagnosis present

## 2023-11-15 DIAGNOSIS — I2510 Atherosclerotic heart disease of native coronary artery without angina pectoris: Secondary | ICD-10-CM | POA: Diagnosis present

## 2023-11-15 DIAGNOSIS — Z833 Family history of diabetes mellitus: Secondary | ICD-10-CM

## 2023-11-15 DIAGNOSIS — Z6833 Body mass index (BMI) 33.0-33.9, adult: Secondary | ICD-10-CM

## 2023-11-15 DIAGNOSIS — N1831 Chronic kidney disease, stage 3a: Secondary | ICD-10-CM | POA: Diagnosis present

## 2023-11-15 DIAGNOSIS — T380X5A Adverse effect of glucocorticoids and synthetic analogues, initial encounter: Secondary | ICD-10-CM | POA: Diagnosis not present

## 2023-11-15 DIAGNOSIS — J9601 Acute respiratory failure with hypoxia: Principal | ICD-10-CM | POA: Diagnosis present

## 2023-11-15 DIAGNOSIS — I10 Essential (primary) hypertension: Secondary | ICD-10-CM | POA: Diagnosis present

## 2023-11-15 DIAGNOSIS — Z7902 Long term (current) use of antithrombotics/antiplatelets: Secondary | ICD-10-CM

## 2023-11-15 DIAGNOSIS — Z809 Family history of malignant neoplasm, unspecified: Secondary | ICD-10-CM

## 2023-11-15 DIAGNOSIS — N183 Chronic kidney disease, stage 3 unspecified: Secondary | ICD-10-CM

## 2023-11-15 DIAGNOSIS — Z955 Presence of coronary angioplasty implant and graft: Secondary | ICD-10-CM

## 2023-11-15 DIAGNOSIS — E782 Mixed hyperlipidemia: Secondary | ICD-10-CM | POA: Diagnosis present

## 2023-11-15 DIAGNOSIS — E1122 Type 2 diabetes mellitus with diabetic chronic kidney disease: Secondary | ICD-10-CM | POA: Diagnosis present

## 2023-11-15 DIAGNOSIS — M109 Gout, unspecified: Secondary | ICD-10-CM | POA: Diagnosis present

## 2023-11-15 DIAGNOSIS — F17210 Nicotine dependence, cigarettes, uncomplicated: Secondary | ICD-10-CM | POA: Diagnosis present

## 2023-11-15 HISTORY — DX: Other specified postprocedural states: Z98.890

## 2023-11-15 HISTORY — DX: Arthrodesis status: Z98.1

## 2023-11-15 LAB — POCT I-STAT, CHEM 8
BUN: 23 mg/dL (ref 8–23)
Calcium, Ion: 1.21 mmol/L (ref 1.15–1.40)
Chloride: 101 mmol/L (ref 98–111)
Creatinine, Ser: 1.2 mg/dL (ref 0.61–1.24)
Glucose, Bld: 212 mg/dL — ABNORMAL HIGH (ref 70–99)
HCT: 26 % — ABNORMAL LOW (ref 39.0–52.0)
Hemoglobin: 8.8 g/dL — ABNORMAL LOW (ref 13.0–17.0)
Potassium: 4.7 mmol/L (ref 3.5–5.1)
Sodium: 135 mmol/L (ref 135–145)
TCO2: 23 mmol/L (ref 22–32)

## 2023-11-15 LAB — SURGICAL PCR SCREEN
MRSA, PCR: NEGATIVE
Staphylococcus aureus: NEGATIVE

## 2023-11-15 LAB — CBC
HCT: 36.7 % — ABNORMAL LOW (ref 39.0–52.0)
HCT: 26.3 % — ABNORMAL LOW (ref 39.0–52.0)
Hemoglobin: 12 g/dL — ABNORMAL LOW (ref 13.0–17.0)
Hemoglobin: 8.6 g/dL — ABNORMAL LOW (ref 13.0–17.0)
MCH: 32.2 pg (ref 26.0–34.0)
MCH: 32.1 pg (ref 26.0–34.0)
MCHC: 32.7 g/dL (ref 30.0–36.0)
MCHC: 32.7 g/dL (ref 30.0–36.0)
MCV: 98.4 fL (ref 80.0–100.0)
MCV: 98.1 fL (ref 80.0–100.0)
Platelets: 118 K/uL — ABNORMAL LOW (ref 150–400)
Platelets: 96 K/uL — ABNORMAL LOW (ref 150–400)
RBC: 3.73 MIL/uL — ABNORMAL LOW (ref 4.22–5.81)
RBC: 2.68 MIL/uL — ABNORMAL LOW (ref 4.22–5.81)
RDW: 14.2 % (ref 11.5–15.5)
RDW: 14.2 % (ref 11.5–15.5)
WBC: 9.4 K/uL (ref 4.0–10.5)
WBC: 10.6 K/uL — ABNORMAL HIGH (ref 4.0–10.5)
nRBC: 0 % (ref 0.0–0.2)
nRBC: 0 % (ref 0.0–0.2)

## 2023-11-15 LAB — BASIC METABOLIC PANEL WITH GFR
Anion gap: 13 (ref 5–15)
BUN: 20 mg/dL (ref 8–23)
CO2: 22 mmol/L (ref 22–32)
Calcium: 9.6 mg/dL (ref 8.9–10.3)
Chloride: 102 mmol/L (ref 98–111)
Creatinine, Ser: 1.31 mg/dL — ABNORMAL HIGH (ref 0.61–1.24)
GFR, Estimated: 58 mL/min — ABNORMAL LOW (ref >60)
Glucose, Bld: 168 mg/dL — ABNORMAL HIGH (ref 70–99)
Potassium: 4.3 mmol/L (ref 3.5–5.1)
Sodium: 137 mmol/L (ref 135–145)

## 2023-11-15 LAB — TYPE AND SCREEN
ABO/RH(D): O POS
Antibody Screen: NEGATIVE

## 2023-11-15 SURGERY — POSTERIOR LUMBAR FUSION 2 LEVEL
Anesthesia: General | Site: Back

## 2023-11-15 MED ORDER — SUGAMMADEX SODIUM 200 MG/2ML IV SOLN
INTRAVENOUS | Status: DC | PRN
  Administered 2023-11-15: 300 mg via INTRAVENOUS

## 2023-11-15 MED ORDER — ROCURONIUM BROMIDE 10 MG/ML (PF) SYRINGE
PREFILLED_SYRINGE | INTRAVENOUS | Status: AC
  Filled 2023-11-15: qty 20

## 2023-11-15 MED ORDER — ONDANSETRON HCL 4 MG PO TABS: 4.0000 mg | ORAL_TABLET | Freq: Four times a day (QID) | ORAL | Status: DC | PRN

## 2023-11-15 MED ORDER — SODIUM CHLORIDE 0.9 % IV SOLN: 250.0000 mL | INTRAVENOUS | Status: AC

## 2023-11-15 MED ORDER — PHENOL 1.4 % MT LIQD
1.0000 | OROMUCOSAL | Status: DC | PRN
Start: 2023-11-15 — End: 2023-11-20

## 2023-11-15 MED ORDER — MENTHOL 3 MG MT LOZG: 1.0000 | LOZENGE | OROMUCOSAL | Status: DC | PRN

## 2023-11-15 MED ORDER — GLIPIZIDE ER 5 MG PO TB24
5.0000 mg | ORAL_TABLET | Freq: Every day | ORAL | Status: DC
  Administered 2023-11-16 – 2023-11-20 (×5): 5 mg via ORAL
  Filled 2023-11-15 (×5): qty 1

## 2023-11-15 MED ORDER — PHENYLEPHRINE HCL-NACL 20-0.9 MG/250ML-% IV SOLN
INTRAVENOUS | Status: DC | PRN
  Administered 2023-11-15: 15 ug/min via INTRAVENOUS

## 2023-11-15 MED ORDER — ISOSORBIDE MONONITRATE ER 30 MG PO TB24
60.0000 mg | ORAL_TABLET | Freq: Every day | ORAL | Status: DC
  Administered 2023-11-16 – 2023-11-20 (×5): 60 mg via ORAL
  Filled 2023-11-15 (×5): qty 2

## 2023-11-15 MED ORDER — DEXAMETHASONE SODIUM PHOSPHATE 10 MG/ML IJ SOLN
INTRAMUSCULAR | Status: AC
  Filled 2023-11-15: qty 2

## 2023-11-15 MED ORDER — GABAPENTIN 300 MG PO CAPS
300.0000 mg | ORAL_CAPSULE | Freq: Three times a day (TID) | ORAL | Status: DC
  Administered 2023-11-15 – 2023-11-20 (×15): 300 mg via ORAL
  Filled 2023-11-15 (×15): qty 1

## 2023-11-15 MED ORDER — NITROGLYCERIN 0.4 MG SL SUBL: 0.4000 mg | SUBLINGUAL_TABLET | SUBLINGUAL | Status: DC | PRN

## 2023-11-15 MED ORDER — POLYETHYLENE GLYCOL 3350 17 G PO PACK
17.0000 g | PACK | Freq: Every day | ORAL | Status: DC | PRN
Start: 2023-11-15 — End: 2023-11-20
  Administered 2023-11-16 – 2023-11-19 (×2): 17 g via ORAL
  Filled 2023-11-15 (×2): qty 1

## 2023-11-15 MED ORDER — ORAL CARE MOUTH RINSE: 15.0000 mL | Freq: Once | OROMUCOSAL | Status: AC

## 2023-11-15 MED ORDER — DEXAMETHASONE SODIUM PHOSPHATE 10 MG/ML IJ SOLN
INTRAMUSCULAR | Status: DC | PRN
  Administered 2023-11-15: 10 mg via INTRAVENOUS

## 2023-11-15 MED ORDER — PHENYLEPHRINE 80 MCG/ML (10ML) SYRINGE FOR IV PUSH (FOR BLOOD PRESSURE SUPPORT)
PREFILLED_SYRINGE | INTRAVENOUS | Status: DC | PRN
  Administered 2023-11-15 (×3): 160 ug via INTRAVENOUS
  Administered 2023-11-15: 80 ug via INTRAVENOUS
  Administered 2023-11-15 (×7): 160 ug via INTRAVENOUS
  Administered 2023-11-15 (×2): 80 ug via INTRAVENOUS
  Administered 2023-11-15: 40 ug via INTRAVENOUS

## 2023-11-15 MED ORDER — ONDANSETRON HCL 4 MG/2ML IJ SOLN
INTRAMUSCULAR | Status: DC | PRN
  Administered 2023-11-15: 4 mg via INTRAVENOUS

## 2023-11-15 MED ORDER — SODIUM CHLORIDE 0.9 % IV SOLN
0.1500 ug/kg/min | INTRAVENOUS | Status: AC
  Administered 2023-11-15: .02 ug/kg/min via INTRAVENOUS
  Filled 2023-11-15: qty 2000

## 2023-11-15 MED ORDER — LIDOCAINE 2% (20 MG/ML) 5 ML SYRINGE
INTRAMUSCULAR | Status: DC | PRN
  Administered 2023-11-15: 100 mg via INTRAVENOUS

## 2023-11-15 MED ORDER — SODIUM CHLORIDE 0.9% FLUSH
3.0000 mL | Freq: Two times a day (BID) | INTRAVENOUS | Status: DC
  Administered 2023-11-15 – 2023-11-20 (×10): 3 mL via INTRAVENOUS

## 2023-11-15 MED ORDER — GLYCOPYRROLATE PF 0.2 MG/ML IJ SOSY
PREFILLED_SYRINGE | INTRAMUSCULAR | Status: DC | PRN
  Administered 2023-11-15: .1 mg via INTRAVENOUS

## 2023-11-15 MED ORDER — CHLORHEXIDINE GLUCONATE 0.12 % MT SOLN
OROMUCOSAL | Status: AC
  Administered 2023-11-15: 15 mL via OROMUCOSAL
  Filled 2023-11-15: qty 15

## 2023-11-15 MED ORDER — ONDANSETRON HCL 4 MG/2ML IJ SOLN: 4.0000 mg | Freq: Once | INTRAMUSCULAR | Status: DC | PRN

## 2023-11-15 MED ORDER — RANOLAZINE ER 500 MG PO TB12
1000.0000 mg | ORAL_TABLET | Freq: Two times a day (BID) | ORAL | Status: DC
  Administered 2023-11-15 – 2023-11-20 (×10): 1000 mg via ORAL
  Filled 2023-11-15 (×12): qty 2

## 2023-11-15 MED ORDER — VANCOMYCIN HCL IN DEXTROSE 1-5 GM/200ML-% IV SOLN: 1000.0000 mg | Freq: Once | INTRAVENOUS | Status: DC

## 2023-11-15 MED ORDER — FENTANYL CITRATE (PF) 250 MCG/5ML IJ SOLN
INTRAMUSCULAR | Status: AC
  Filled 2023-11-15: qty 5

## 2023-11-15 MED ORDER — ALLOPURINOL 100 MG PO TABS
100.0000 mg | ORAL_TABLET | Freq: Every day | ORAL | Status: DC
  Administered 2023-11-16 – 2023-11-20 (×5): 100 mg via ORAL
  Filled 2023-11-15 (×5): qty 1

## 2023-11-15 MED ORDER — LACTATED RINGERS IV SOLN: INTRAVENOUS | Status: DC

## 2023-11-15 MED ORDER — MIDAZOLAM HCL 2 MG/2ML IJ SOLN
INTRAMUSCULAR | Status: AC
  Filled 2023-11-15: qty 2

## 2023-11-15 MED ORDER — ALBUMIN HUMAN 5 % IV SOLN: INTRAVENOUS | Status: DC | PRN

## 2023-11-15 MED ORDER — CHLORHEXIDINE GLUCONATE CLOTH 2 % EX PADS
6.0000 | MEDICATED_PAD | Freq: Once | CUTANEOUS | Status: AC
  Administered 2023-11-15: 6 via TOPICAL

## 2023-11-15 MED ORDER — 0.9 % SODIUM CHLORIDE (POUR BTL) OPTIME
TOPICAL | Status: DC | PRN
  Administered 2023-11-15: 1000 mL

## 2023-11-15 MED ORDER — HYDROMORPHONE HCL 1 MG/ML IJ SOLN
INTRAMUSCULAR | Status: DC | PRN
  Administered 2023-11-15: .5 mg via INTRAVENOUS

## 2023-11-15 MED ORDER — VANCOMYCIN HCL IN DEXTROSE 1-5 GM/200ML-% IV SOLN
INTRAVENOUS | Status: AC
  Administered 2023-11-15: 1000 mg via INTRAVENOUS
  Filled 2023-11-15: qty 200

## 2023-11-15 MED ORDER — VASOPRESSIN 20 UNIT/ML IV SOLN
INTRAVENOUS | Status: AC
  Filled 2023-11-15: qty 1

## 2023-11-15 MED ORDER — LIDOCAINE-EPINEPHRINE 1 %-1:100000 IJ SOLN
INTRAMUSCULAR | Status: DC | PRN
  Administered 2023-11-15: 5 mL

## 2023-11-15 MED ORDER — LACTATED RINGERS IV SOLN: INTRAVENOUS | Status: DC | PRN

## 2023-11-15 MED ORDER — ACETAMINOPHEN 325 MG PO TABS
650.0000 mg | ORAL_TABLET | ORAL | Status: DC | PRN
Start: 2023-11-15 — End: 2023-11-20
  Administered 2023-11-17 (×2): 650 mg via ORAL
  Filled 2023-11-15 (×3): qty 2

## 2023-11-15 MED ORDER — CHLORHEXIDINE GLUCONATE 0.12 % MT SOLN: 15.0000 mL | Freq: Once | OROMUCOSAL | Status: AC

## 2023-11-15 MED ORDER — CHLORHEXIDINE GLUCONATE CLOTH 2 % EX PADS: 6.0000 | MEDICATED_PAD | Freq: Once | CUTANEOUS | Status: DC

## 2023-11-15 MED ORDER — THROMBIN 5000 UNITS EX KIT
PACK | CUTANEOUS | Status: AC
  Filled 2023-11-15: qty 1

## 2023-11-15 MED ORDER — HYDROMORPHONE HCL 1 MG/ML IJ SOLN
INTRAMUSCULAR | Status: AC
  Filled 2023-11-15: qty 0.5

## 2023-11-15 MED ORDER — ACETAMINOPHEN 650 MG RE SUPP: 650.0000 mg | RECTAL | Status: DC | PRN

## 2023-11-15 MED ORDER — ROSUVASTATIN CALCIUM 20 MG PO TABS
40.0000 mg | ORAL_TABLET | Freq: Every day | ORAL | Status: DC
  Administered 2023-11-16 – 2023-11-20 (×5): 40 mg via ORAL
  Filled 2023-11-15 (×5): qty 2

## 2023-11-15 MED ORDER — VANCOMYCIN HCL 1000 MG IV SOLR
1000.0000 mg | Freq: Once | INTRAVENOUS | Status: AC
  Administered 2023-11-16: 1000 mg via INTRAVENOUS
  Filled 2023-11-15: qty 20

## 2023-11-15 MED ORDER — METHOCARBAMOL 500 MG PO TABS
500.0000 mg | ORAL_TABLET | Freq: Four times a day (QID) | ORAL | Status: DC | PRN
  Administered 2023-11-15 – 2023-11-17 (×5): 500 mg via ORAL
  Filled 2023-11-15 (×5): qty 1

## 2023-11-15 MED ORDER — DOCUSATE SODIUM 100 MG PO CAPS
100.0000 mg | ORAL_CAPSULE | Freq: Two times a day (BID) | ORAL | Status: DC
  Administered 2023-11-15 – 2023-11-20 (×10): 100 mg via ORAL
  Filled 2023-11-15 (×10): qty 1

## 2023-11-15 MED ORDER — HYDROMORPHONE HCL 1 MG/ML IJ SOLN
1.0000 mg | INTRAMUSCULAR | Status: DC | PRN
  Administered 2023-11-15 – 2023-11-19 (×7): 1 mg via INTRAVENOUS
  Filled 2023-11-15 (×8): qty 1

## 2023-11-15 MED ORDER — VANCOMYCIN HCL IN DEXTROSE 1-5 GM/200ML-% IV SOLN: 1000.0000 mg | INTRAVENOUS | Status: AC

## 2023-11-15 MED ORDER — PHENYLEPHRINE 80 MCG/ML (10ML) SYRINGE FOR IV PUSH (FOR BLOOD PRESSURE SUPPORT)
PREFILLED_SYRINGE | INTRAVENOUS | Status: AC
  Filled 2023-11-15: qty 30

## 2023-11-15 MED ORDER — ONDANSETRON HCL 4 MG/2ML IJ SOLN
INTRAMUSCULAR | Status: AC
Start: 2023-11-15 — End: 2023-11-15
  Filled 2023-11-15: qty 4

## 2023-11-15 MED ORDER — PROPOFOL 500 MG/50ML IV EMUL
INTRAVENOUS | Status: DC | PRN
  Administered 2023-11-15: 100 ug/kg/min via INTRAVENOUS

## 2023-11-15 MED ORDER — SENNA 8.6 MG PO TABS
1.0000 | ORAL_TABLET | Freq: Two times a day (BID) | ORAL | Status: DC
  Administered 2023-11-15 – 2023-11-20 (×10): 8.6 mg via ORAL
  Filled 2023-11-15 (×10): qty 1

## 2023-11-15 MED ORDER — FENTANYL CITRATE (PF) 100 MCG/2ML IJ SOLN
25.0000 ug | INTRAMUSCULAR | Status: DC | PRN
  Administered 2023-11-15: 25 ug via INTRAVENOUS

## 2023-11-15 MED ORDER — ACETAMINOPHEN 10 MG/ML IV SOLN
1000.0000 mg | Freq: Once | INTRAVENOUS | Status: DC | PRN
  Administered 2023-11-15: 1000 mg via INTRAVENOUS

## 2023-11-15 MED ORDER — DEXMEDETOMIDINE HCL IN NACL 80 MCG/20ML IV SOLN
INTRAVENOUS | Status: DC | PRN
  Administered 2023-11-15: 8 ug via INTRAVENOUS

## 2023-11-15 MED ORDER — LIDOCAINE 2% (20 MG/ML) 5 ML SYRINGE
INTRAMUSCULAR | Status: AC
Start: 2023-11-15 — End: 2023-11-15
  Filled 2023-11-15: qty 10

## 2023-11-15 MED ORDER — KETAMINE HCL 50 MG/5ML IJ SOSY
PREFILLED_SYRINGE | INTRAMUSCULAR | Status: AC
  Filled 2023-11-15: qty 5

## 2023-11-15 MED ORDER — FENTANYL CITRATE (PF) 100 MCG/2ML IJ SOLN
INTRAMUSCULAR | Status: AC
  Filled 2023-11-15: qty 2

## 2023-11-15 MED ORDER — LIDOCAINE-EPINEPHRINE 1 %-1:100000 IJ SOLN
INTRAMUSCULAR | Status: AC
Start: 2023-11-15 — End: 2023-11-15
  Filled 2023-11-15: qty 1

## 2023-11-15 MED ORDER — EPHEDRINE 5 MG/ML INJ
INTRAVENOUS | Status: AC
  Filled 2023-11-15: qty 15

## 2023-11-15 MED ORDER — BUPIVACAINE HCL (PF) 0.5 % IJ SOLN
INTRAMUSCULAR | Status: DC | PRN
  Administered 2023-11-15: 5 mL

## 2023-11-15 MED ORDER — BUPIVACAINE HCL (PF) 0.5 % IJ SOLN
INTRAMUSCULAR | Status: AC
  Filled 2023-11-15: qty 30

## 2023-11-15 MED ORDER — TIZANIDINE HCL 4 MG PO CAPS: 4.0000 mg | ORAL_CAPSULE | Freq: Three times a day (TID) | ORAL | Status: DC | PRN

## 2023-11-15 MED ORDER — ONDANSETRON HCL 4 MG/2ML IJ SOLN: 4.0000 mg | Freq: Four times a day (QID) | INTRAMUSCULAR | Status: DC | PRN

## 2023-11-15 MED ORDER — VANCOMYCIN HCL IN DEXTROSE 1-5 GM/200ML-% IV SOLN
1000.0000 mg | Freq: Once | INTRAVENOUS | Status: DC
  Filled 2023-11-15: qty 200

## 2023-11-15 MED ORDER — FENTANYL CITRATE (PF) 250 MCG/5ML IJ SOLN
INTRAMUSCULAR | Status: DC | PRN
  Administered 2023-11-15 (×3): 50 ug via INTRAVENOUS

## 2023-11-15 MED ORDER — MIDAZOLAM HCL 2 MG/2ML IJ SOLN
INTRAMUSCULAR | Status: DC | PRN
  Administered 2023-11-15 (×2): 1 mg via INTRAVENOUS

## 2023-11-15 MED ORDER — ROCURONIUM BROMIDE 10 MG/ML (PF) SYRINGE
PREFILLED_SYRINGE | INTRAVENOUS | Status: DC | PRN
  Administered 2023-11-15 (×2): 20 mg via INTRAVENOUS
  Administered 2023-11-15: 30 mg via INTRAVENOUS
  Administered 2023-11-15: 70 mg via INTRAVENOUS
  Administered 2023-11-15: 20 mg via INTRAVENOUS
  Administered 2023-11-15: 50 mg via INTRAVENOUS
  Administered 2023-11-15: 20 mg via INTRAVENOUS

## 2023-11-15 MED ORDER — PROPOFOL 10 MG/ML IV BOLUS
INTRAVENOUS | Status: DC | PRN
  Administered 2023-11-15: 130 mg via INTRAVENOUS

## 2023-11-15 MED ORDER — METOPROLOL TARTRATE 50 MG PO TABS
75.0000 mg | ORAL_TABLET | Freq: Two times a day (BID) | ORAL | Status: DC
  Administered 2023-11-15 – 2023-11-20 (×10): 75 mg via ORAL
  Filled 2023-11-15 (×10): qty 1

## 2023-11-15 MED ORDER — FLEET ENEMA RE ENEM: 1.0000 | ENEMA | Freq: Once | RECTAL | Status: DC | PRN

## 2023-11-15 MED ORDER — BISACODYL 10 MG RE SUPP: 10.0000 mg | Freq: Every day | RECTAL | Status: DC | PRN

## 2023-11-15 MED ORDER — ACETAMINOPHEN 10 MG/ML IV SOLN
INTRAVENOUS | Status: AC
  Filled 2023-11-15: qty 100

## 2023-11-15 MED ORDER — KETAMINE HCL 10 MG/ML IJ SOLN
INTRAMUSCULAR | Status: DC | PRN
  Administered 2023-11-15: 10 mg via INTRAVENOUS
  Administered 2023-11-15: 20 mg via INTRAVENOUS
  Administered 2023-11-15 (×2): 10 mg via INTRAVENOUS

## 2023-11-15 MED ORDER — SODIUM CHLORIDE 0.9% FLUSH: 3.0000 mL | INTRAVENOUS | Status: DC | PRN

## 2023-11-15 MED ORDER — METHOCARBAMOL 1000 MG/10ML IJ SOLN
INTRAMUSCULAR | Status: DC | PRN
Start: 2023-11-15 — End: 2023-11-15
  Administered 2023-11-15: 1000 mg via INTRAVENOUS

## 2023-11-15 MED ORDER — CEFAZOLIN SODIUM-DEXTROSE 2-4 GM/100ML-% IV SOLN
INTRAVENOUS | Status: AC
  Filled 2023-11-15: qty 100

## 2023-11-15 MED ORDER — EPHEDRINE SULFATE-NACL 50-0.9 MG/10ML-% IV SOSY
PREFILLED_SYRINGE | INTRAVENOUS | Status: DC | PRN
  Administered 2023-11-15 (×6): 10 mg via INTRAVENOUS

## 2023-11-15 MED ORDER — AMLODIPINE BESYLATE 10 MG PO TABS
10.0000 mg | ORAL_TABLET | Freq: Every day | ORAL | Status: DC
Start: 2023-11-16 — End: 2023-11-20
  Administered 2023-11-16 – 2023-11-20 (×5): 10 mg via ORAL
  Filled 2023-11-15 (×5): qty 1

## 2023-11-15 MED ORDER — ICOSAPENT ETHYL 1 G PO CAPS
2.0000 g | ORAL_CAPSULE | Freq: Two times a day (BID) | ORAL | Status: DC
  Administered 2023-11-15 – 2023-11-20 (×10): 2 g via ORAL
  Filled 2023-11-15 (×13): qty 2

## 2023-11-15 SURGICAL SUPPLY — 63 items
BAG COUNTER SPONGE SURGICOUNT (BAG) ×1 IMPLANT
BASKET BONE COLLECTION (BASKET) ×1 IMPLANT
BLADE BONE MILL MEDIUM (MISCELLANEOUS) ×1 IMPLANT
BLADE CLIPPER SURG (BLADE) IMPLANT
BUR MATCHSTICK NEURO 3.0 LAGG (BURR) ×1 IMPLANT
CAGE COROENT MP 8X9X23M-8 SPIN (Cage) IMPLANT
CAGE MAS PLIF 9X9X23-8 LUMBAR (Cage) IMPLANT
CANISTER SUCTION 3000ML PPV (SUCTIONS) ×1 IMPLANT
CNTNR URN SCR LID CUP LEK RST (MISCELLANEOUS) ×1 IMPLANT
COVER BACK TABLE 60X90IN (DRAPES) ×1 IMPLANT
DERMABOND ADVANCED .7 DNX12 (GAUZE/BANDAGES/DRESSINGS) ×1 IMPLANT
DEVICE DISSECT PLASMABLAD 3.0S (MISCELLANEOUS) ×1 IMPLANT
DRAPE C-ARM 42X72 X-RAY (DRAPES) ×2 IMPLANT
DRAPE HALF SHEET 40X57 (DRAPES) IMPLANT
DRAPE LAPAROTOMY 100X72X124 (DRAPES) ×1 IMPLANT
DURAPREP 26ML APPLICATOR (WOUND CARE) ×1 IMPLANT
DURASEAL APPLICATOR TIP (TIP) IMPLANT
DURASEAL SPINE SEALANT 3ML (MISCELLANEOUS) IMPLANT
ELECTRODE REM PT RTRN 9FT ADLT (ELECTROSURGICAL) ×1 IMPLANT
GAUZE 4X4 16PLY ~~LOC~~+RFID DBL (SPONGE) IMPLANT
GAUZE SPONGE 4X4 12PLY STRL (GAUZE/BANDAGES/DRESSINGS) ×1 IMPLANT
GAUZE SPONGE 4X4 16PLY XRAY LF (GAUZE/BANDAGES/DRESSINGS) IMPLANT
GLOVE BIOGEL PI IND STRL 7.0 (GLOVE) IMPLANT
GLOVE BIOGEL PI IND STRL 7.5 (GLOVE) IMPLANT
GLOVE BIOGEL PI IND STRL 8.5 (GLOVE) ×2 IMPLANT
GLOVE ECLIPSE 8.5 STRL (GLOVE) ×2 IMPLANT
GLOVE SURG SS PI 7.0 STRL IVOR (GLOVE) IMPLANT
GOWN STRL REUS W/ TWL LRG LVL3 (GOWN DISPOSABLE) IMPLANT
GOWN STRL REUS W/ TWL XL LVL3 (GOWN DISPOSABLE) IMPLANT
GOWN STRL REUS W/TWL 2XL LVL3 (GOWN DISPOSABLE) ×2 IMPLANT
GRAFT BNE CHIP CANC 1-8 40 (Bone Implant) IMPLANT
GRAFT BONE PROTEIOS LRG 5CC (Orthopedic Implant) IMPLANT
HEMOSTAT POWDER KIT SURGIFOAM (HEMOSTASIS) ×1 IMPLANT
KIT BASIN OR (CUSTOM PROCEDURE TRAY) ×1 IMPLANT
KIT GRAFTMAG DEL NEURO DISP (NEUROSURGERY SUPPLIES) IMPLANT
KIT TURNOVER KIT B (KITS) ×1 IMPLANT
MILL BONE PREP (MISCELLANEOUS) ×1 IMPLANT
NDL HYPO 22X1.5 SAFETY MO (MISCELLANEOUS) ×1 IMPLANT
NDL SPNL 18GX3.5 QUINCKE PK (NEEDLE) IMPLANT
NEEDLE HYPO 22X1.5 SAFETY MO (MISCELLANEOUS) ×1 IMPLANT
NEEDLE SPNL 18GX3.5 QUINCKE PK (NEEDLE) ×1 IMPLANT
NS IRRIG 1000ML POUR BTL (IV SOLUTION) ×1 IMPLANT
PACK LAMINECTOMY NEURO (CUSTOM PROCEDURE TRAY) ×1 IMPLANT
PAD ARMBOARD POSITIONER FOAM (MISCELLANEOUS) ×3 IMPLANT
PATTIES SURGICAL .5 X1 (DISPOSABLE) ×1 IMPLANT
ROD RELIN-O LORD 5.5X65MM (Rod) IMPLANT
SCREW LOCK RELINE 5.5 TULIP (Screw) IMPLANT
SCREW PA TFNA RELINE 5.5X45 (Screw) IMPLANT
SPIKE FLUID TRANSFER (MISCELLANEOUS) ×1 IMPLANT
SPONGE SURGIFOAM ABS GEL 100 (HEMOSTASIS) IMPLANT
SPONGE T-LAP 4X18 ~~LOC~~+RFID (SPONGE) IMPLANT
SUT PROLENE 6 0 BV (SUTURE) IMPLANT
SUT VIC AB 1 CT1 18XBRD ANBCTR (SUTURE) ×1 IMPLANT
SUT VIC AB 2-0 CP2 18 (SUTURE) ×1 IMPLANT
SUT VIC AB 3-0 SH 8-18 (SUTURE) ×1 IMPLANT
SUT VIC AB 4-0 RB1 18 (SUTURE) ×1 IMPLANT
SYR 3ML LL SCALE MARK (SYRINGE) ×4 IMPLANT
SYR 5ML LL (SYRINGE) IMPLANT
TAPE CLOTH SURG 4X10 WHT LF (GAUZE/BANDAGES/DRESSINGS) IMPLANT
TOWEL GREEN STERILE (TOWEL DISPOSABLE) ×1 IMPLANT
TOWEL GREEN STERILE FF (TOWEL DISPOSABLE) ×1 IMPLANT
TRAY FOLEY MTR SLVR 16FR STAT (SET/KITS/TRAYS/PACK) ×1 IMPLANT
WATER STERILE IRR 1000ML POUR (IV SOLUTION) ×1 IMPLANT

## 2023-11-15 NOTE — Transfer of Care (Signed)
 Immediate Anesthesia Transfer of Care Note  Patient: Robert Ramos  Procedure(s) Performed: POSTERIOR LUMBAR INTERBODY FUSION LUMBAR ONE-TWO LUMBAR TWO-THREE (Back)  Patient Location: PACU  Anesthesia Type:General  Level of Consciousness: awake and drowsy  Airway & Oxygen Therapy: Patient Spontanous Breathing and Patient connected to face mask oxygen  Post-op Assessment: Report given to RN, Post -op Vital signs reviewed and stable, and Patient moving all extremities X 4  Post vital signs: Reviewed and stable  Last Vitals:  Vitals Value Taken Time  BP 97/63 11/15/23 17:00  Temp 36.2 C 11/15/23 16:45  Pulse 75 11/15/23 17:06  Resp 16 11/15/23 17:06  SpO2 99 % 11/15/23 17:06  Vitals shown include unfiled device data.  Last Pain:  Vitals:   11/15/23 1645  TempSrc:   PainSc: Asleep      Patients Stated Pain Goal: 2 (11/15/23 0940)  Complications: No notable events documented.

## 2023-11-15 NOTE — Op Note (Signed)
 Date of surgery: 11/15/2023 Preoperative diagnosis: Lumbar spinal stenosis L1-2 and L2-3 with neurogenic claudication, lumbar radiculopathy.  History of fusion L3 to the sacrum. Postoperative diagnosis: Same Procedure: Bilateral laminectomies L1-2 and L2-3 with decompression for central spinal canal stenosis with more work than required for simple interbody technique.  Posterior lumbar interbody arthrodesis L1-2 and L2-3 with peek spacers local autograft allograft and Proteus interbody fusion.  Posterolateral arthrodesis with local autograft allograft and Proteus L1-L3.  Pedicle screw fixation L1-L3 with fluoroscopic guidance. Surgeon: Victory Gens First Assistant: Rockey Peru MD Anesthesia: General Endotracheal Indications: Robert Ramos is a 73 year old individual who has had significant spinal stenosis for a long period of time has had various surgeries for previous episodes of spinal stenosis at L4-5 L5-S1 L3-4.  These of all resulted in fusion.  He now has advancing degenerative spinal stenosis at L1-2 and L2-3 and has been advised regarding surgery.  Procedure: The patient was brought to the operating room supine on the stretcher.  After the smooth induction of general tracheal anesthesia, he was carefully turned prone.  The back was prepped with alcohol DuraPrep and draped in a sterile fashion.  The area above his previous incision was opened after marking the elbow to 3 disc space positively.  Dissection was carried down to the lumbodorsal fascia which was opened on either side of midline and the laminar arches of L2 and L3 could be identified.  These were dissected free out to the facet joints and then over this to the transverse processes at L1-2 and L2-3.  Once these were isolated laminotomies were created removing the inferior margin lamina of L2 out to including the entirety of the facet at L2-L3.  This was done bilaterally similar process was carried out at L1-L2.  Then the thickened  redundant yellow ligament was taken up and laminotomies were enlarged to decompress the common dural tube out to the far lateral recesses.  This required undercutting of the facet joint substantially and removing the so much bone that the entire superior facet of L3 and L2 were completely naked.  With this a good decompression was obtained and the discectomy to me's were performed by entering the disc space at L2-3 and L3 1 2.  This was done with the help of Dr. Carolee who performed retraction while working laterally and I worked medially.  We continue to do this until the disc was removed from all 4 quadrants left right superiorly and laterally.  The interbody space was then sized for an appropriate size spacer and at L2-3 was felt that a 9 mm tall 8 degree lordotic spacer measuring 23 mm in length would fit best to these were prepared and filled with a combination of autograft allograft in the form of cancellous bone chips which was mixed with 5 cc of Proteus.  All the autograft from the laminectomies was used.  Then the interspace was packed with approximately 9 cc of bone graft and the 2 spacers were placed into the interval this was done also with the help of Dr. Peru who provided retraction while I worked to place the interbody spacers and the graft.  At the L1-L2 level there is noted that the interspace was a bit tighter and this allowed only 8 cc of bone graft to be placed along with an 8 mm tall by 23 mm spacer measuring degrees of lordosis.  With the interbody grafting performed pedicle entry sites were then chosen using fluoroscopic guidance in while working combination with Dr.  Cabbell worked from the opposite side we were able to place the screws well and these were 5.5 x 45 mm screws placed in L1-L2 and L3 bilaterally.  Then the lateral gutters were packed with the remaining autograft allograft and Proteus combination for total of 10 cc in each lateral gutter between L1 and L3.  The rods were  connected in a neutral construct using 65 mm precontoured rods.  In the end the final radiographs identified good reduction of the degenerative process good overall alignment both in the coronal and sagittal planes.  With this the retractors were removed and the lumbodorsal fascia was closed with #1 Vicryl should be noted that during the entirety of the case the patient lose considerably because of his history of Plavix  use.  Total blood loss was estimated 650 cc 250 cc of Cell Saver blood was returned to the patient.  #1 Vicryl was used to close the lumbodorsal fascia 203 0 Vicryl was used to close the subcutaneous and subcuticular skin and a final 4-0 Vicryl closure was performed in the subcuticular layer superficially.  The patient was returned to recovery room in stable condition.

## 2023-11-15 NOTE — Progress Notes (Addendum)
 After getting up to chair.

## 2023-11-15 NOTE — Interval H&P Note (Signed)
 History and Physical Interval Note:  11/15/2023 11:25 AM  Robert Ramos  has presented today for surgery, with the diagnosis of SPINAL STENOSIS, LUMBAR REGION WITH NEUROGENIC CLAUDICATION.  The various methods of treatment have been discussed with the patient and family. After consideration of risks, benefits and other options for treatment, the patient has consented to  Procedure(s) with comments: POSTERIOR LUMBAR FUSION 2 LEVEL (N/A) - PLIF L12, L23 as a surgical intervention.  The patient's history has been reviewed, patient examined, no change in status, stable for surgery.  I have reviewed the patient's chart and labs.  Questions were answered to the patient's satisfaction.     Robert Ramos

## 2023-11-15 NOTE — Anesthesia Procedure Notes (Signed)
 Procedure Name: Intubation Date/Time: 11/15/2023 12:12 PM  Performed by: Mollie Olivia SAUNDERS, CRNAPre-anesthesia Checklist: Patient identified, Emergency Drugs available, Suction available and Patient being monitored Patient Re-evaluated:Patient Re-evaluated prior to induction Oxygen Delivery Method: Circle system utilized Preoxygenation: Pre-oxygenation with 100% oxygen Induction Type: IV induction Ventilation: Oral airway inserted - appropriate to patient size, Two handed mask ventilation required and Mask ventilation with difficulty Laryngoscope Size: Mac and 4 Grade View: Grade III Tube type: Oral Tube size: 7.5 mm Number of attempts: 1 Airway Equipment and Method: Stylet and Oral airway Placement Confirmation: ETT inserted through vocal cords under direct vision, positive ETCO2 and breath sounds checked- equal and bilateral Secured at: 23 cm Tube secured with: Tape Dental Injury: Teeth and Oropharynx as per pre-operative assessment  Difficulty Due To: Difficult Airway- due to immobile epiglottis and Difficult Airway- due to large tongue Comments: Pt has a beard that makes it difficult to mask him and his epiglottis hid the vc's

## 2023-11-15 NOTE — H&P (Signed)
 Robert Ramos is an 73 y.o. male.   Chief Complaint: Back bilateral leg pain secondary to spinal stenosis HPI: Robert Ramos is a 73 year old individuals had significant progressive spinal stenosis in the lower lumbar spine initially at L3-4 and L4-5 he has undergone decompression and fusion and now has adjacent level disease at 2 levels L1-2 and L2-3 which has progressed to the point where is affecting his ability to walk.  Is been advised regarding the need for surgical decompression and stabilization L1 L3.  Past Medical History:  Diagnosis Date   Anemia 09/28/2019   Arthritis    hands, back (03/02/2018)   ASHD (arteriosclerotic heart disease) 08/27/2015   B12 deficiency 05/21/2022   Body mass index (BMI) 34.0-34.9, adult 03/15/2019   Chronic bilateral low back pain with bilateral sciatica 01/22/2020   Chronic pain of left knee 09/28/2019   Chronic pain syndrome 01/22/2020   Chronic radicular low back pain    Coronary artery disease    a. MI in 2002 s/p stenting to mRCA b.cath 11/2016 s/p DES to mLAD c. Cath 08/2017- patent LAD stent, 40% instent restenosis of mRCA, 99% posterolateral artery s/p PTCA & DES.   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   Kidney stone 08/27/2015   Long-term use of high-risk medication 08/27/2015   Lumbago with sciatica, right side 01/22/2020   Lumbar foraminal stenosis 12/06/2020   Lumbar post-laminectomy syndrome 06/08/2019   Lumbar stenosis with neurogenic claudication 11/11/2021   Malaise and fatigue 08/27/2015   Mixed hyperlipidemia 08/27/2015    Non-seasonal allergic rhinitis 07/04/2018   Obstructive sleep apnea 11/17/2016   Pneumonia    Precordial chest pain 11/24/2017   Prediabetes 08/27/2015   Radiculopathy, lumbar region 03/15/2019   Shortness of breath 11/17/2016   Spinal stenosis, 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   Stage 3 chronic kidney disease (HCC) 08/27/2015   Patient unaware.  Creatine on 11/2021 was 1.27.   Tremor 05/30/2020    Past Surgical History:  Procedure Laterality Date   ANTERIOR LAT LUMBAR FUSION N/A 02/23/2023   Procedure: extreme Lateral Interbody Fusion - Lumbar two-Lumbar three - Interbody Fusion;  Surgeon: Colon Shove, MD;  Location: [facility] OR;  Service: Neurosurgery;  Laterality: N/A;   APPENDECTOMY  1974   BACK SURGERY     CORONARY ANGIOPLASTY WITH STENT PLACEMENT  2002; 11/20/2016   @ [facility]; @ [facility]   CORONARY CTO INTERVENTION N/A 03/02/2018   Procedure: CORONARY CTO INTERVENTION - Right;  Surgeon: Swaziland, Peter M, MD;  Location: [facility] INVASIVE CV LAB;  Service: Cardiovascular;  Laterality: N/A;   CORONARY PRESSURE/FFR STUDY N/A 09/16/2017   Procedure: INTRAVASCULAR PRESSURE WIRE/FFR STUDY;  Surgeon: Claudene Shove ORN, MD;  Location: [facility] INVASIVE CV LAB;  Service: Cardiovascular;  Laterality: N/A;   CORONARY STENT INTERVENTION N/A 11/20/2016   Procedure: CORONARY STENT INTERVENTION;  Surgeon: Mady Bruckner, MD;  Location: [facility] INVASIVE CV LAB;  Service: Cardiovascular;  Laterality: N/A;   CORONARY STENT INTERVENTION N/A 09/09/2017   Procedure: CORONARY STENT INTERVENTION;  Surgeon: Verlin Bruckner BIRCH, MD;  Location: [facility] INVASIVE  CV LAB;  Service: Cardiovascular;  Laterality: N/A;   CORONARY ULTRASOUND/IVUS N/A 11/20/2016   Procedure: Intravascular Ultrasound/IVUS;  Surgeon: Mady Bruckner, MD;  Location: [facility] INVASIVE CV LAB;  Service: Cardiovascular;  Laterality: N/A;   HEMI-MICRODISCECTOMY LUMBAR LAMINECTOMY LEVEL 1 Left 2008   L4   KNEE ARTHROSCOPY Left 1983   LEFT  HEART CATH AND CORONARY ANGIOGRAPHY N/A 11/20/2016   Procedure: LEFT HEART CATH AND CORONARY ANGIOGRAPHY;  Surgeon: Mady Bruckner, MD;  Location: [facility] INVASIVE CV LAB;  Service: Cardiovascular;  Laterality: N/A;   LEFT HEART CATH AND CORONARY ANGIOGRAPHY N/A 09/09/2017   Procedure: LEFT HEART CATH AND CORONARY ANGIOGRAPHY;  Surgeon: Verlin Bruckner BIRCH, MD;  Location: [facility] INVASIVE CV LAB;  Service: Cardiovascular;  Laterality: N/A;   LEFT HEART CATH AND CORONARY ANGIOGRAPHY N/A 09/16/2017   Procedure: LEFT HEART CATH AND CORONARY ANGIOGRAPHY;  Surgeon: Claudene Victory ORN, MD;  Location: [facility] INVASIVE CV LAB;  Service: Cardiovascular;  Laterality: N/A;   LEFT HEART CATH AND CORONARY ANGIOGRAPHY N/A 02/21/2018   Procedure: LEFT HEART CATH AND CORONARY ANGIOGRAPHY;  Surgeon: Wonda Sharper, MD;  Location: [facility] INVASIVE CV LAB;  Service: Cardiovascular;  Laterality: N/A;   LUMBAR FUSION  12/11/2020   L1-2-3-4   LUMBAR LAMINECTOMY/DECOMPRESSION MICRODISCECTOMY N/A 11/11/2021   Procedure: LUMBAR THREE-FOUR SUBLAMINAR DECOMPRESSION;  Surgeon: Colon Victory, MD;  Location: [facility] OR;  Service: Neurosurgery;  Laterality: N/A;   TONSILLECTOMY     ULTRASOUND GUIDANCE FOR VASCULAR ACCESS  03/02/2018   Procedure: Ultrasound Guidance For Vascular Access;  Surgeon: Swaziland, Peter M, MD;  Location: [facility] INVASIVE CV LAB;  Service: Cardiovascular;;    Family History  Problem Relation Age of Onset   Diabetes Mother    Cancer Father    Suicidality Brother    Social History:  reports that he has been smoking cigarettes. He started smoking about 57 years ago. He has a 35 pack-year smoking history. He has never used smokeless tobacco. He reports current alcohol use of about 4.0 standard drinks of alcohol per week. He reports that he does not use drugs.  Allergies:  Allergies  Allergen Reactions   Penicillins Anaphylaxis, Swelling and Rash   Hydrocodone Nausea Only    Sick on stomach all day long    Oxycodone  Nausea Only     Sick on stomach all day long    Zolpidem Other (See Comments)    dizziness   Januvia [Sitagliptin] Nausea And Vomiting and Rash    Full body rash    No medications prior to admission.    No results found for this or any previous visit (from the past 48 hours). No results found.  Review of Systems  Constitutional:  Positive for activity change.  Musculoskeletal:  Positive for back pain and gait problem.  Neurological:  Positive for weakness and numbness.  All other systems reviewed and are negative.   Height 5' 5 (1.651 m), weight 90.7 kg. Physical Exam Constitutional:      Appearance: Normal appearance.  HENT:     Head: Normocephalic and atraumatic.     Right Ear: Tympanic membrane, ear canal and external ear normal.     Left Ear: Tympanic membrane, ear canal and external ear normal.     Nose: Nose normal.     Mouth/Throat:     Mouth: Mucous membranes are moist.     Pharynx: Oropharynx is clear.  Eyes:     Extraocular Movements: Extraocular movements intact.     Conjunctiva/sclera: Conjunctivae normal.  Pupils: Pupils are equal, round, and reactive to light.  Cardiovascular:     Rate and Rhythm: Normal rate and regular rhythm.     Pulses: Normal pulses.     Heart sounds: Normal heart sounds.  Pulmonary:     Effort: Pulmonary effort is normal.     Breath sounds: Normal breath sounds.  Abdominal:     General: Abdomen is flat. Bowel sounds are normal.     Palpations: Abdomen is soft.  Musculoskeletal:     Cervical back: Normal range of motion and neck supple.  Skin:    General: Skin is warm and dry.     Capillary Refill: Capillary refill takes less than 2 seconds.  Neurological:     Mental Status: He is alert.     Comments: Cranial nerve examination is normal upper extremities normal reflexes are 1+ in the biceps trace in the brachioradialis and absent in the patellae absent in the Achilles lower extremity strength reveals some diffuse weakness in the  iliopsoas quadricep tibialis anterior and gastrocs all graded 4- out of 5.  Station and gait revealed that he is unsteady on his feet positive Romberg's test is noted.  Psychiatric:        Mood and Affect: Mood normal.        Behavior: Behavior normal.        Thought Content: Thought content normal.        Judgment: Judgment normal.      Assessment/Plan Spondylosis and stenosis with neurogenic claudication lumbar radiculopathy L1-2 and L2-3  Victory JINNY Gens, MD 11/15/2023, 7:46 AM

## 2023-11-16 LAB — CBC WITH DIFFERENTIAL/PLATELET
Basophils Absolute: 0 K/uL (ref 0.0–0.1)
Basophils Relative: 0 %
Eosinophils Absolute: 0 K/uL (ref 0.0–0.5)
Eosinophils Relative: 0 %
HCT: 26.8 % — ABNORMAL LOW (ref 39.0–52.0)
Hemoglobin: 8.6 g/dL — ABNORMAL LOW (ref 13.0–17.0)
Lymphocytes Relative: 4 %
Lymphs Abs: 0.9 K/uL (ref 0.7–4.0)
MCH: 32.1 pg (ref 26.0–34.0)
MCHC: 32.1 g/dL (ref 30.0–36.0)
MCV: 100 fL (ref 80.0–100.0)
Monocytes Absolute: 3.4 K/uL — ABNORMAL HIGH (ref 0.1–1.0)
Monocytes Relative: 16 %
Neutro Abs: 17 K/uL — ABNORMAL HIGH (ref 1.7–7.7)
Neutrophils Relative %: 80 %
Platelets: 105 K/uL — ABNORMAL LOW (ref 150–400)
RBC: 2.68 MIL/uL — ABNORMAL LOW (ref 4.22–5.81)
RDW: 14.8 % (ref 11.5–15.5)
WBC: 21.3 K/uL — ABNORMAL HIGH (ref 4.0–10.5)
nRBC: 0 % (ref 0.0–0.2)

## 2023-11-16 LAB — BASIC METABOLIC PANEL WITH GFR
Anion gap: 11 (ref 5–15)
BUN: 23 mg/dL (ref 8–23)
CO2: 24 mmol/L (ref 22–32)
Calcium: 8.5 mg/dL — ABNORMAL LOW (ref 8.9–10.3)
Chloride: 97 mmol/L — ABNORMAL LOW (ref 98–111)
Creatinine, Ser: 1.54 mg/dL — ABNORMAL HIGH (ref 0.61–1.24)
GFR, Estimated: 48 mL/min — ABNORMAL LOW (ref >60)
Glucose, Bld: 214 mg/dL — ABNORMAL HIGH (ref 70–99)
Potassium: 4.6 mmol/L (ref 3.5–5.1)
Sodium: 132 mmol/L — ABNORMAL LOW (ref 135–145)

## 2023-11-16 MED ORDER — HYDROMORPHONE HCL 2 MG PO TABS
2.0000 mg | ORAL_TABLET | ORAL | Status: DC | PRN
  Administered 2023-11-16 – 2023-11-18 (×7): 2 mg via ORAL
  Filled 2023-11-16 (×9): qty 1

## 2023-11-16 MED FILL — Sodium Chloride IV Soln 0.9%: INTRAVENOUS | Qty: 2000 | Status: AC

## 2023-11-16 MED FILL — Heparin Sodium (Porcine) Inj 1000 Unit/ML: INTRAMUSCULAR | Qty: 30 | Status: AC

## 2023-11-16 NOTE — Progress Notes (Signed)
 PT Cancellation Note  Patient Details Name: Robert Ramos MRN: [id number] DOB: 12-Jan-1951   Cancelled Treatment:    Reason Eval/Treat Not Completed: Pain limiting ability to participate  Patient reports pain 11/10. RN notified of need for pain meds. Will return.   Macario RAMAN, PT Acute Rehabilitation Services  Office [phone]   Macario SHAUNNA Soja 11/16/2023, 8:44 AM

## 2023-11-16 NOTE — Progress Notes (Signed)
 Patient ID: Robert Ramos, male   DOB: December 19, 1950, 73 y.o.   MRN: [id number] Vital signs are stable this morning.  Patient's blood pressure has been soft over the evening.  Will check his CBC and check be met this morning.  Mobilize.

## 2023-11-16 NOTE — Progress Notes (Signed)
 Orthopedic Tech Progress Note Patient Details:  Robert Ramos [birth year] [id number]  Ortho Devices Type of Ortho Device: Lumbar corsett Ortho Device/Splint Interventions: Ordered  Delivered to room not fitted.    [name] PARAS 11/16/2023, 5:48 AM

## 2023-11-16 NOTE — Anesthesia Postprocedure Evaluation (Signed)
 Anesthesia Post Note  Patient: Robert Ramos  Procedure(s) Performed: POSTERIOR LUMBAR INTERBODY FUSION LUMBAR ONE-TWO LUMBAR TWO-THREE (Back)     Patient location during evaluation: PACU Anesthesia Type: General Level of consciousness: awake and alert Pain management: pain level controlled Vital Signs Assessment: post-procedure vital signs reviewed and stable Respiratory status: spontaneous breathing, nonlabored ventilation, respiratory function stable and patient connected to nasal cannula oxygen Cardiovascular status: blood pressure returned to baseline and stable Postop Assessment: no apparent nausea or vomiting Anesthetic complications: no   No notable events documented.  Last Vitals:  Vitals:   11/15/23 2308 11/16/23 0303  BP: 109/76 120/77  Pulse: 75   Resp: 17   Temp: 36.9 C 36.8 C  SpO2: 92%     Last Pain:  Vitals:   11/16/23 0507  TempSrc:   PainSc: 2                  [name]

## 2023-11-16 NOTE — Plan of Care (Signed)
 Problem: Education: Goal: Knowledge of General Education information will improve Description: Including pain rating scale, medication(s)/side effects and non-pharmacologic comfort measures 11/16/2023 0042 by Marvis Kenneth SAILOR, RN Outcome: Progressing 11/15/2023 2137 by Marvis Kenneth SAILOR, RN Outcome: Progressing   Problem: Health Behavior/Discharge Planning: Goal: Ability to manage health-related needs will improve 11/16/2023 0042 by Marvis Kenneth SAILOR, RN Outcome: Progressing 11/15/2023 2137 by Marvis Kenneth SAILOR, RN Outcome: Progressing   Problem: Clinical Measurements: Goal: Ability to maintain clinical measurements within normal limits will improve 11/16/2023 0042 by Marvis Kenneth SAILOR, RN Outcome: Progressing 11/15/2023 2137 by Marvis Kenneth SAILOR, RN Outcome: Progressing Goal: Will remain free from infection 11/16/2023 0042 by Marvis Kenneth SAILOR, RN Outcome: Progressing 11/15/2023 2137 by Marvis Kenneth SAILOR, RN Outcome: Progressing Goal: Diagnostic test results will improve 11/16/2023 0042 by Marvis Kenneth SAILOR, RN Outcome: Progressing 11/15/2023 2137 by Marvis Kenneth SAILOR, RN Outcome: Progressing Goal: Respiratory complications will improve 11/16/2023 0042 by Marvis Kenneth SAILOR, RN Outcome: Progressing 11/15/2023 2137 by Marvis Kenneth SAILOR, RN Outcome: Progressing Goal: Cardiovascular complication will be avoided 11/16/2023 0042 by Marvis Kenneth SAILOR, RN Outcome: Progressing 11/15/2023 2137 by Marvis Kenneth SAILOR, RN Outcome: Progressing   Problem: Activity: Goal: Risk for activity intolerance will decrease 11/16/2023 0042 by Marvis Kenneth SAILOR, RN Outcome: Progressing 11/15/2023 2137 by Marvis Kenneth SAILOR, RN Outcome: Progressing   Problem: Nutrition: Goal: Adequate nutrition will be maintained 11/16/2023 0042 by Marvis Kenneth SAILOR, RN Outcome: Progressing 11/15/2023 2137 by Marvis Kenneth SAILOR, RN Outcome: Progressing   Problem: Coping: Goal: Level of anxiety will decrease 11/16/2023  0042 by Marvis Kenneth SAILOR, RN Outcome: Progressing 11/15/2023 2137 by Marvis Kenneth SAILOR, RN Outcome: Progressing   Problem: Elimination: Goal: Will not experience complications related to bowel motility 11/16/2023 0042 by Marvis Kenneth SAILOR, RN Outcome: Progressing 11/15/2023 2137 by Marvis Kenneth SAILOR, RN Outcome: Progressing Goal: Will not experience complications related to urinary retention 11/16/2023 0042 by Marvis Kenneth SAILOR, RN Outcome: Progressing 11/15/2023 2137 by Marvis Kenneth SAILOR, RN Outcome: Progressing   Problem: Pain Managment: Goal: General experience of comfort will improve and/or be controlled 11/16/2023 0042 by Marvis Kenneth SAILOR, RN Outcome: Progressing 11/15/2023 2137 by Marvis Kenneth SAILOR, RN Outcome: Progressing   Problem: Safety: Goal: Ability to remain free from injury will improve 11/16/2023 0042 by Marvis Kenneth SAILOR, RN Outcome: Progressing 11/15/2023 2137 by Marvis Kenneth SAILOR, RN Outcome: Progressing   Problem: Skin Integrity: Goal: Risk for impaired skin integrity will decrease 11/16/2023 0042 by Marvis Kenneth SAILOR, RN Outcome: Progressing 11/15/2023 2137 by Marvis Kenneth SAILOR, RN Outcome: Progressing   Problem: Education: Goal: Ability to verbalize activity precautions or restrictions will improve 11/16/2023 0042 by Marvis Kenneth SAILOR, RN Outcome: Progressing 11/15/2023 2137 by Marvis Kenneth SAILOR, RN Outcome: Progressing Goal: Knowledge of the prescribed therapeutic regimen will improve 11/16/2023 0042 by Marvis Kenneth SAILOR, RN Outcome: Progressing 11/15/2023 2137 by Marvis Kenneth SAILOR, RN Outcome: Progressing Goal: Understanding of discharge needs will improve 11/16/2023 0042 by Marvis Kenneth SAILOR, RN Outcome: Progressing 11/15/2023 2137 by Marvis Kenneth SAILOR, RN Outcome: Progressing   Problem: Activity: Goal: Ability to avoid complications of mobility impairment will improve 11/16/2023 0042 by Marvis Kenneth SAILOR, RN Outcome: Progressing 11/15/2023 2137 by  Marvis Kenneth SAILOR, RN Outcome: Progressing Goal: Ability to tolerate increased activity will improve 11/16/2023 0042 by Marvis Kenneth SAILOR, RN Outcome: Progressing 11/15/2023 2137 by Marvis Kenneth SAILOR, RN Outcome: Progressing Goal: Will remain free from falls 11/16/2023 0042 by Marvis Kenneth SAILOR, RN Outcome: Progressing 11/15/2023 2137 by  Marvis Kenneth SAILOR, RN Outcome: Progressing   Problem: Bowel/Gastric: Goal: Gastrointestinal status for postoperative course will improve 11/16/2023 0042 by Marvis Kenneth SAILOR, RN Outcome: Progressing 11/15/2023 2137 by Marvis Kenneth SAILOR, RN Outcome: Progressing   Problem: Clinical Measurements: Goal: Ability to maintain clinical measurements within normal limits will improve 11/16/2023 0042 by Marvis Kenneth SAILOR, RN Outcome: Progressing 11/15/2023 2137 by Marvis Kenneth SAILOR, RN Outcome: Progressing Goal: Postoperative complications will be avoided or minimized 11/16/2023 0042 by Marvis Kenneth SAILOR, RN Outcome: Progressing 11/15/2023 2137 by Marvis Kenneth SAILOR, RN Outcome: Progressing Goal: Diagnostic test results will improve 11/16/2023 0042 by Marvis Kenneth SAILOR, RN Outcome: Progressing 11/15/2023 2137 by Marvis Kenneth SAILOR, RN Outcome: Progressing   Problem: Pain Management: Goal: Pain level will decrease 11/16/2023 0042 by Marvis Kenneth SAILOR, RN Outcome: Progressing 11/15/2023 2137 by Marvis Kenneth SAILOR, RN Outcome: Progressing   Problem: Skin Integrity: Goal: Will show signs of wound healing 11/16/2023 0042 by Marvis Kenneth SAILOR, RN Outcome: Progressing 11/15/2023 2137 by Marvis Kenneth SAILOR, RN Outcome: Progressing   Problem: Health Behavior/Discharge Planning: Goal: Identification of resources available to assist in meeting health care needs will improve 11/16/2023 0042 by Marvis Kenneth SAILOR, RN Outcome: Progressing 11/15/2023 2137 by Marvis Kenneth SAILOR, RN Outcome: Progressing   Problem: Bladder/Genitourinary: Goal: Urinary functional status for  postoperative course will improve 11/16/2023 0042 by Marvis Kenneth SAILOR, RN Outcome: Progressing 11/15/2023 2137 by Marvis Kenneth SAILOR, RN Outcome: Progressing

## 2023-11-16 NOTE — Evaluation (Signed)
 Physical Therapy Evaluation Patient Details Name: Robert Ramos MRN: [id number] DOB: 1950-10-27 Today's Date: 11/16/2023  History of Present Illness  Robert Ramos is a 73 yo male who underwent surgical decompression at L1-3 with posterolateral arthrodesis and pedicle screw fixation 11/15/23. PMH includes: Arthritis, Gout, HTN, Hyperlipidemia, CAD, previous lumbar surgery, CKD, tremor  Clinical Impression   Pt admitted secondary to problem above with deficits below. PTA patient lives with wife and son in one level home with 2 steps to enter with rail. He was modified independent with ambulation with cane vs walking stick. Pt currently requires CGA for bed mobility with use of bed features, min assist for transfers with RW, and CGA for ambulation x 35 ft with RW.  Anticipate patient will benefit from PT to address problems listed below. Will continue to follow acutely to maximize functional mobility, independence, and safety.  Anticipate pt will progress to have no followup PT needs. Anticipate will need RW on discharge.          If plan is discharge home, recommend the following: A little help with walking and/or transfers;Assistance with cooking/housework;Assist for transportation;Help with stairs or ramp for entrance   Can travel by private vehicle        Equipment Recommendations Rolling walker (2 wheels)  Recommendations for Other Services  OT consult    Functional Status Assessment Patient has had a recent decline in their functional status and demonstrates the ability to make significant improvements in function in a reasonable and predictable amount of time.     Precautions / Restrictions Precautions Precautions: Back;Fall Precaution Booklet Issued: Yes (comment) Recall of Precautions/Restrictions: Impaired Precaution/Restrictions Comments: could only recall no bending from prior surgeries; by end of session able to state 2/3 Required Braces or Orthoses: Spinal  Brace Spinal Brace: Lumbar corset;Applied in sitting position      Mobility  Bed Mobility Overal bed mobility: Needs Assistance Bed Mobility: Rolling, Sidelying to Sit Rolling: Supervision, Used rails Sidelying to sit: Contact guard assist, HOB elevated, Used rails       General bed mobility comments: HOB ~15; min cues for technique; pt reports he usually sleeps in his recliner post-surgery    Transfers Overall transfer level: Needs assistance Equipment used: Rolling walker (2 wheels) Transfers: Sit to/from Stand Sit to Stand: Min assist           General transfer comment: vc for hand placement (pt pulling up on RW with tipping of RW); steadying assist    Ambulation/Gait Ambulation/Gait assistance: Contact guard assist Gait Distance (Feet): 35 Feet Assistive device: Rolling walker (2 wheels) Gait Pattern/deviations: Step-through pattern, Decreased stride length, Trunk flexed   Gait velocity interpretation: <1.8 ft/sec, indicate of risk for recurrent falls   General Gait Details: pt reporting severe neck pain with forward head posture and trunk flexion; difficulty correcting due to pain  Stairs            Wheelchair Mobility     Tilt Bed    Modified Rankin (Stroke Patients Only)       Balance Overall balance assessment: Needs assistance Sitting-balance support: No upper extremity supported, Feet supported Sitting balance-Leahy Scale: Fair     Standing balance support: Bilateral upper extremity supported Standing balance-Leahy Scale: Poor                               Pertinent Vitals/Pain Pain Assessment Pain Assessment: 0-10 Pain Score: 10-Worst pain ever Pain  Location: back and neck Pain Descriptors / Indicators: Aching, Constant Pain Intervention(s): Limited activity within patient's tolerance, Monitored during session, Premedicated before session, Repositioned, Patient requesting pain meds-RN notified    Home Living  Family/patient expects to be discharged to:: Private residence Living Arrangements: Spouse/significant other;Children (SON) Available Help at Discharge: Family;Available 24 hours/day Type of Home: House Home Access: Stairs to enter Entrance Stairs-Rails: Right Entrance Stairs-Number of Steps: 2   Home Layout: One level Home Equipment: Cane - single point;Grab bars - toilet;Adaptive equipment;Shower seat (walking stick)      Prior Function Prior Level of Function : Independent/Modified Independent;Driving             Mobility Comments: intermittent use of SPC vs walking stick; in store uses shopping cart ADLs Comments: mod I, drives     Extremity/Trunk Assessment   Upper Extremity Assessment Upper Extremity Assessment: Defer to OT evaluation    Lower Extremity Assessment Lower Extremity Assessment: Generalized weakness    Cervical / Trunk Assessment Cervical / Trunk Assessment: Back Surgery  Communication   Communication Communication: No apparent difficulties    Cognition Arousal: Alert Behavior During Therapy: Anxious   PT - Cognitive impairments: No apparent impairments                         Following commands: Intact       Cueing Cueing Techniques: Verbal cues     General Comments General comments (skin integrity, edema, etc.): on RA; VSS    Exercises     Assessment/Plan    PT Assessment Patient needs continued PT services  PT Problem List Decreased activity tolerance;Decreased balance;Decreased mobility;Decreased knowledge of use of DME;Decreased knowledge of precautions;Obesity;Pain       PT Treatment Interventions DME instruction;Gait training;Stair training;Functional mobility training;Therapeutic activities;Patient/family education    PT Goals (Current goals can be found in the Care Plan section)  Acute Rehab PT Goals Patient Stated Goal: decr pain PT Goal Formulation: With patient Time For Goal Achievement:  11/30/23 Potential to Achieve Goals: Good    Frequency Min 5X/week     Co-evaluation               AM-PAC PT 6 Clicks Mobility  Outcome Measure Help needed turning from your back to your side while in a flat bed without using bedrails?: A Little Help needed moving from lying on your back to sitting on the side of a flat bed without using bedrails?: A Little Help needed moving to and from a bed to a chair (including a wheelchair)?: A Little Help needed standing up from a chair using your arms (e.g., wheelchair or bedside chair)?: A Little Help needed to walk in hospital room?: A Little Help needed climbing 3-5 steps with a railing? : A Lot 6 Click Score: 17    End of Session Equipment Utilized During Treatment: Gait belt;Back brace Activity Tolerance: Patient limited by pain Patient left: in chair;with call bell/phone within reach;with family/visitor present Nurse Communication: Mobility status;Patient requests pain meds PT Visit Diagnosis: Other abnormalities of gait and mobility (R26.89);Pain Pain - part of body:  (back and neck)    Time: 0934-1000 PT Time Calculation (min) (ACUTE ONLY): 26 min   Charges:   PT Evaluation $PT Eval Low Complexity: 1 Low PT Treatments $Gait Training: 8-22 mins PT General Charges $$ ACUTE PT VISIT: 1 Visit          Macario RAMAN, PT Acute Rehabilitation Services  Office [phone]  Macario SQUIBB Ivory Bail 11/16/2023, 10:12 AM

## 2023-11-16 NOTE — Plan of Care (Signed)
  Problem: Education: Goal: Knowledge of General Education information will improve Description: Including pain rating scale, medication(s)/side effects and non-pharmacologic comfort measures Outcome: Progressing   Problem: Clinical Measurements: Goal: Respiratory complications will improve Outcome: Progressing   Problem: Activity: Goal: Risk for activity intolerance will decrease Outcome: Progressing   Problem: Nutrition: Goal: Adequate nutrition will be maintained Outcome: Progressing   Problem: Coping: Goal: Level of anxiety will decrease Outcome: Progressing   Problem: Elimination: Goal: Will not experience complications related to urinary retention Outcome: Progressing   Problem: Safety: Goal: Ability to remain free from injury will improve Outcome: Progressing   

## 2023-11-16 NOTE — Evaluation (Signed)
 Occupational Therapy Evaluation Patient Details Name: Robert Ramos MRN: [id number] DOB: 03/27/1951 Today's Date: 11/16/2023   History of Present Illness   Robert Ramos is a 73 yo male who underwent surgical decompression at L1-3 with posterolateral arthrodesis and pedicle screw fixation 11/15/23. PMH includes: Arthritis, Gout, HTN, Hyperlipidemia, CAD, previous lumbar surgery, CKD, tremor     Clinical Impressions Pt reports ind at baseline with ADLs and intermittent use of SPC for mobility. Pt using AE for LB ADL since prior back surgeries. Pt lives with spouse who can assist at d/c. Pt educated on brace wear, back precautions, and compensatory strategies for ADLs, pt familiar with these but will benefit from continued reinforcement. Pt presenting with impairments listed below, will follow acutely. Anticipate no OT follow up needs at d/c.      If plan is discharge home, recommend the following:   A little help with walking and/or transfers;A little help with bathing/dressing/bathroom;Assistance with cooking/housework;Assist for transportation;Help with stairs or ramp for entrance     Functional Status Assessment   Patient has had a recent decline in their functional status and demonstrates the ability to make significant improvements in function in a reasonable and predictable amount of time.     Equipment Recommendations   Other (comment) (RW)     Recommendations for Other Services   PT consult     Precautions/Restrictions   Precautions Precautions: Back;Fall Precaution Booklet Issued: Yes (comment) Recall of Precautions/Restrictions: Impaired Precaution/Restrictions Comments: could only recall no bending from prior surgeries; by end of session able to state 2/3 Required Braces or Orthoses: Spinal Brace Spinal Brace: Lumbar corset;Applied in sitting position Restrictions Weight Bearing Restrictions Per Provider Order: No     Mobility Bed  Mobility Overal bed mobility: Needs Assistance Bed Mobility: Rolling, Sidelying to Sit Rolling: Supervision, Used rails Sidelying to sit: Contact guard assist, HOB elevated, Used rails            Transfers Overall transfer level: Needs assistance Equipment used: Rolling walker (2 wheels) Transfers: Sit to/from Stand Sit to Stand: Min assist                  Balance Overall balance assessment: Needs assistance Sitting-balance support: No upper extremity supported, Feet supported Sitting balance-Leahy Scale: Fair     Standing balance support: Bilateral upper extremity supported Standing balance-Leahy Scale: Poor                             ADL either performed or assessed with clinical judgement   ADL Overall ADL's : Needs assistance/impaired Eating/Feeding: Set up;Sitting   Grooming: Set up;Sitting   Upper Body Bathing: Minimal assistance;Sitting   Lower Body Bathing: Minimal assistance;Sitting/lateral leans;Sit to/from stand   Upper Body Dressing : Minimal assistance;Sitting   Lower Body Dressing: Minimal assistance;Sitting/lateral leans;Sit to/from stand   Toilet Transfer: Contact guard assist;Ambulation;Rolling walker (2 wheels)   Toileting- Clothing Manipulation and Hygiene: Contact guard assist       Functional mobility during ADLs: Contact guard assist;Rolling walker (2 wheels)       Vision   Vision Assessment?: No apparent visual deficits     Perception Perception: Not tested       Praxis Praxis: Not tested       Pertinent Vitals/Pain Pain Assessment Pain Assessment: Faces Pain Score: 7  Faces Pain Scale: Hurts even more Pain Location: back and neck Pain Descriptors / Indicators: Aching, Constant Pain Intervention(s): Limited activity within  patient's tolerance, Monitored during session, Repositioned     Extremity/Trunk Assessment Upper Extremity Assessment Upper Extremity Assessment: Generalized weakness   Lower  Extremity Assessment Lower Extremity Assessment: Defer to PT evaluation   Cervical / Trunk Assessment Cervical / Trunk Assessment: Back Surgery   Communication Communication Communication: No apparent difficulties   Cognition Arousal: Alert Behavior During Therapy: WFL for tasks assessed/performed Cognition: No apparent impairments                               Following commands: Intact       Cueing  General Comments   Cueing Techniques: Verbal cues  VSS on RA   Exercises     Shoulder Instructions      Home Living Family/patient expects to be discharged to:: Private residence Living Arrangements: Spouse/significant other;Children Available Help at Discharge: Family;Available 24 hours/day Type of Home: House Home Access: Stairs to enter [employer] of Steps: 2 Entrance Stairs-Rails: Right Home Layout: One level     Bathroom Shower/Tub: Producer, television/film/video: Standard Bathroom Accessibility: Yes   Home Equipment: Cane - single point;Grab bars - toilet;Adaptive equipment;Shower seat (walking stick) Adaptive Equipment: Reacher;Sock aid        Prior Functioning/Environment Prior Level of Function : Independent/Modified Independent;Driving             Mobility Comments: intermittent use of SPC vs walking stick; in store uses shopping cart ADLs Comments: mod I, drives    OT Problem List: Decreased strength;Decreased activity tolerance;Decreased range of motion;Impaired balance (sitting and/or standing)   OT Treatment/Interventions: Self-care/ADL training;Therapeutic exercise;Energy conservation;DME and/or AE instruction;Therapeutic activities;Balance training;Patient/family education      OT Goals(Current goals can be found in the care plan section)   Acute Rehab OT Goals Patient Stated Goal: none stated OT Goal Formulation: With patient Time For Goal Achievement: 11/30/23 Potential to Achieve Goals: Good ADL  Goals Pt Will Perform Upper Body Dressing: with supervision;sitting Pt Will Perform Lower Body Dressing: with supervision;sit to/from stand;sitting/lateral leans Pt Will Transfer to Toilet: with supervision;ambulating;regular height toilet Pt Will Perform Tub/Shower Transfer: Shower transfer;with supervision;ambulating   OT Frequency:  Min 2X/week    Co-evaluation              AM-PAC OT 6 Clicks Daily Activity     Outcome Measure Help from another person eating meals?: A Little Help from another person taking care of personal grooming?: A Little Help from another person toileting, which includes using toliet, bedpan, or urinal?: A Little Help from another person bathing (including washing, rinsing, drying)?: A Little Help from another person to put on and taking off regular upper body clothing?: A Little Help from another person to put on and taking off regular lower body clothing?: A Little 6 Click Score: 18   End of Session Equipment Utilized During Treatment: Gait belt;Rolling walker (2 wheels);Back brace Nurse Communication: Mobility status  Activity Tolerance: Patient tolerated treatment well Patient left: in chair;with call bell/phone within reach  OT Visit Diagnosis: Unsteadiness on feet (R26.81);Other abnormalities of gait and mobility (R26.89);Muscle weakness (generalized) (M62.81)                Time: 8669-8645 OT Time Calculation (min): 24 min Charges:  OT General Charges $OT Visit: 1 Visit OT Evaluation $OT Eval Moderate Complexity: 1 Mod OT Treatments $Self Care/Home Management : 8-22 mins  Ceira Hoeschen K, OTD, OTR/L SecureChat Preferred Acute Rehab [phone]  Hamdi Kley K Koonce 11/16/2023, 3:58 PM

## 2023-11-17 DIAGNOSIS — M5416 Radiculopathy, lumbar region: Secondary | ICD-10-CM | POA: Diagnosis present

## 2023-11-17 DIAGNOSIS — T380X5A Adverse effect of glucocorticoids and synthetic analogues, initial encounter: Secondary | ICD-10-CM | POA: Diagnosis not present

## 2023-11-17 DIAGNOSIS — D62 Acute posthemorrhagic anemia: Secondary | ICD-10-CM | POA: Diagnosis not present

## 2023-11-17 DIAGNOSIS — D696 Thrombocytopenia, unspecified: Secondary | ICD-10-CM | POA: Diagnosis not present

## 2023-11-17 DIAGNOSIS — G894 Chronic pain syndrome: Secondary | ICD-10-CM | POA: Diagnosis present

## 2023-11-17 DIAGNOSIS — M48062 Spinal stenosis, lumbar region with neurogenic claudication: Secondary | ICD-10-CM | POA: Diagnosis present

## 2023-11-17 DIAGNOSIS — J9691 Respiratory failure, unspecified with hypoxia: Secondary | ICD-10-CM | POA: Diagnosis not present

## 2023-11-17 DIAGNOSIS — E1165 Type 2 diabetes mellitus with hyperglycemia: Secondary | ICD-10-CM | POA: Diagnosis present

## 2023-11-17 DIAGNOSIS — I129 Hypertensive chronic kidney disease with stage 1 through stage 4 chronic kidney disease, or unspecified chronic kidney disease: Secondary | ICD-10-CM | POA: Diagnosis present

## 2023-11-17 DIAGNOSIS — N1831 Chronic kidney disease, stage 3a: Secondary | ICD-10-CM | POA: Diagnosis present

## 2023-11-17 DIAGNOSIS — J9601 Acute respiratory failure with hypoxia: Secondary | ICD-10-CM | POA: Diagnosis present

## 2023-11-17 DIAGNOSIS — Z7902 Long term (current) use of antithrombotics/antiplatelets: Secondary | ICD-10-CM | POA: Diagnosis not present

## 2023-11-17 DIAGNOSIS — E782 Mixed hyperlipidemia: Secondary | ICD-10-CM | POA: Diagnosis present

## 2023-11-17 DIAGNOSIS — Z7982 Long term (current) use of aspirin: Secondary | ICD-10-CM | POA: Diagnosis not present

## 2023-11-17 DIAGNOSIS — F17210 Nicotine dependence, cigarettes, uncomplicated: Secondary | ICD-10-CM | POA: Diagnosis present

## 2023-11-17 DIAGNOSIS — M109 Gout, unspecified: Secondary | ICD-10-CM | POA: Diagnosis present

## 2023-11-17 DIAGNOSIS — N179 Acute kidney failure, unspecified: Secondary | ICD-10-CM | POA: Diagnosis present

## 2023-11-17 DIAGNOSIS — I2510 Atherosclerotic heart disease of native coronary artery without angina pectoris: Secondary | ICD-10-CM | POA: Diagnosis present

## 2023-11-17 DIAGNOSIS — Z7984 Long term (current) use of oral hypoglycemic drugs: Secondary | ICD-10-CM | POA: Diagnosis not present

## 2023-11-17 DIAGNOSIS — Z833 Family history of diabetes mellitus: Secondary | ICD-10-CM | POA: Diagnosis not present

## 2023-11-17 DIAGNOSIS — Z955 Presence of coronary angioplasty implant and graft: Secondary | ICD-10-CM | POA: Diagnosis not present

## 2023-11-17 DIAGNOSIS — E66811 Obesity, class 1: Secondary | ICD-10-CM | POA: Diagnosis present

## 2023-11-17 DIAGNOSIS — E1122 Type 2 diabetes mellitus with diabetic chronic kidney disease: Secondary | ICD-10-CM | POA: Diagnosis present

## 2023-11-17 DIAGNOSIS — Z6833 Body mass index (BMI) 33.0-33.9, adult: Secondary | ICD-10-CM | POA: Diagnosis not present

## 2023-11-17 DIAGNOSIS — G4733 Obstructive sleep apnea (adult) (pediatric): Secondary | ICD-10-CM | POA: Diagnosis present

## 2023-11-17 HISTORY — DX: Acute respiratory failure with hypoxia: J96.01

## 2023-11-17 LAB — PATHOLOGIST SMEAR REVIEW

## 2023-11-17 MED ORDER — IPRATROPIUM-ALBUTEROL 0.5-2.5 (3) MG/3ML IN SOLN
3.0000 mL | Freq: Four times a day (QID) | RESPIRATORY_TRACT | Status: DC
  Administered 2023-11-17 – 2023-11-18 (×2): 3 mL via RESPIRATORY_TRACT
  Filled 2023-11-17 (×2): qty 3

## 2023-11-17 MED ORDER — NICOTINE 21 MG/24HR TD PT24
21.0000 mg | MEDICATED_PATCH | Freq: Every day | TRANSDERMAL | Status: DC
  Administered 2023-11-18 – 2023-11-20 (×3): 21 mg via TRANSDERMAL
  Filled 2023-11-17 (×3): qty 1

## 2023-11-17 MED ORDER — DEXAMETHASONE 4 MG PO TABS
2.0000 mg | ORAL_TABLET | Freq: Two times a day (BID) | ORAL | Status: DC
  Administered 2023-11-17 – 2023-11-18 (×4): 2 mg via ORAL
  Filled 2023-11-17 (×4): qty 1

## 2023-11-17 NOTE — Consult Note (Signed)
 Initial Consultation Note   Patient: Robert Ramos FMW:[id number] DOB: October 18, 1950 PCP: Thurmond Cathlyn LABOR., MD DOA: 11/15/2023 DOS: the patient was seen and examined on 11/17/2023 Primary service: Colon Shove, MD  Referring physician: Dr. Shove Colon Reason for consult: Acute hypoxic respiratory failure  Assessment/Plan: Assessment and Plan:  #1 acute hypoxic respiratory failure: More than likely this is secondary to undiagnosed COPD.  Patient will get a chest x-ray to rule out pneumonia or fluid overload after surgery.  Patient will get breathing treatments also.  Will consider steroids if needed.  #2 status post spinal fusion surgery: Continue per spine surgery.  Continue to monitor.  #3 coronary artery disease: History of coronary artery disease.  No evidence of coronary artery decompensation but patient could have CHF.  #4 hyperlipidemia: Continue with statin  #5 essential hypertension: Blood pressure is controlled.  #6 obstructive sleep apnea: Will need CPAP at night  #7 tobacco abuse: Continue with nicotine  patch and cessation counseling.  #8 DM:On Glipizide  from home.  Will add sliding scale insulin .  TRH will continue to follow the patient.  HPI: Robert Ramos is a 73 y.o. male with past medical history of patient is 73 year old gentleman who was admitted after spinal fusion surgery for lumbar spinal disease that became hypoxic postoperatively.  Patient is now requiring 6 L of oxygen.  No prior history of COPD.  No history of cardiac disease.  Medicine is therefore consulted for further evaluation and treatment.  Patient has history of coronary artery disease, also has history of tobacco abuse..  Review of Systems: As mentioned in the history of present illness. All other systems reviewed and are negative. Past Medical History:  Diagnosis Date   Anemia 09/28/2019   Arthritis    hands, back (03/02/2018)   ASHD (arteriosclerotic heart disease) 08/27/2015   B12  deficiency 05/21/2022   Body mass index (BMI) 34.0-34.9, adult 03/15/2019   Chronic bilateral low back pain with bilateral sciatica 01/22/2020   Chronic pain of left knee 09/28/2019   Chronic pain syndrome 01/22/2020   Chronic radicular low back pain    Coronary artery disease    a. MI in 2002 s/p stenting to mRCA b.cath 11/2016 s/p DES to mLAD c. Cath 08/2017- patent LAD stent, 40% instent restenosis of mRCA, 99% posterolateral artery s/p PTCA & DES.   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   Kidney stone 08/27/2015   Long-term use of high-risk medication 08/27/2015   Lumbago with sciatica, right side 01/22/2020   Lumbar foraminal stenosis 12/06/2020   Lumbar post-laminectomy syndrome 06/08/2019   Lumbar stenosis with neurogenic claudication 11/11/2021   Malaise and fatigue 08/27/2015   Mixed hyperlipidemia 08/27/2015   Non-seasonal allergic rhinitis 07/04/2018   Obstructive sleep apnea 11/17/2016   Pneumonia    Precordial chest pain 11/24/2017   Prediabetes 08/27/2015   Radiculopathy, lumbar region 03/15/2019   Shortness of breath 11/17/2016   Spinal stenosis, 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   Stage 3 chronic kidney disease (HCC) 08/27/2015   Patient unaware.  Creatine on 11/2021 was 1.27.  Tremor 05/30/2020   Past Surgical History:  Procedure Laterality Date   ANTERIOR LAT LUMBAR FUSION N/A 02/23/2023   Procedure: extreme Lateral Interbody Fusion - Lumbar two-Lumbar three - Interbody Fusion;  Surgeon: Colon Shove, MD;  Location: [facility] OR;  Service: Neurosurgery;   Laterality: N/A;   APPENDECTOMY  1974   BACK SURGERY     CORONARY ANGIOPLASTY WITH STENT PLACEMENT  2002; 11/20/2016   @ [facility]; @ [facility]   CORONARY CTO INTERVENTION N/A 03/02/2018   Procedure: CORONARY CTO INTERVENTION - Right;  Surgeon: Swaziland, Peter M, MD;  Location: [facility] INVASIVE CV LAB;  Service: Cardiovascular;  Laterality: N/A;   CORONARY PRESSURE/FFR STUDY N/A 09/16/2017   Procedure: INTRAVASCULAR PRESSURE WIRE/FFR STUDY;  Surgeon: Claudene Shove ORN, MD;  Location: [facility] INVASIVE CV LAB;  Service: Cardiovascular;  Laterality: N/A;   CORONARY STENT INTERVENTION N/A 11/20/2016   Procedure: CORONARY STENT INTERVENTION;  Surgeon: Mady Bruckner, MD;  Location: [facility] INVASIVE CV LAB;  Service: Cardiovascular;  Laterality: N/A;   CORONARY STENT INTERVENTION N/A 09/09/2017   Procedure: CORONARY STENT INTERVENTION;  Surgeon: Verlin Bruckner BIRCH, MD;  Location: [facility] INVASIVE CV LAB;  Service: Cardiovascular;  Laterality: N/A;   CORONARY ULTRASOUND/IVUS N/A 11/20/2016   Procedure: Intravascular Ultrasound/IVUS;  Surgeon: Mady Bruckner, MD;  Location: [facility] INVASIVE CV LAB;  Service: Cardiovascular;  Laterality: N/A;   HEMI-MICRODISCECTOMY LUMBAR LAMINECTOMY LEVEL 1 Left 2008   L4   KNEE ARTHROSCOPY Left 1983   LEFT HEART CATH AND CORONARY ANGIOGRAPHY N/A 11/20/2016   Procedure: LEFT HEART CATH AND CORONARY ANGIOGRAPHY;  Surgeon: Mady Bruckner, MD;  Location: [facility] INVASIVE CV LAB;  Service: Cardiovascular;  Laterality: N/A;   LEFT HEART CATH AND CORONARY ANGIOGRAPHY N/A 09/09/2017   Procedure: LEFT HEART CATH AND CORONARY ANGIOGRAPHY;  Surgeon: Verlin Bruckner BIRCH, MD;  Location: [facility] INVASIVE CV LAB;  Service: Cardiovascular;  Laterality: N/A;   LEFT HEART CATH AND CORONARY ANGIOGRAPHY N/A 09/16/2017   Procedure: LEFT HEART CATH AND CORONARY ANGIOGRAPHY;  Surgeon: Claudene Shove ORN, MD;  Location: [facility] INVASIVE CV LAB;  Service: Cardiovascular;  Laterality: N/A;   LEFT HEART CATH AND CORONARY  ANGIOGRAPHY N/A 02/21/2018   Procedure: LEFT HEART CATH AND CORONARY ANGIOGRAPHY;  Surgeon: Wonda Sharper, MD;  Location: [facility] INVASIVE CV LAB;  Service: Cardiovascular;  Laterality: N/A;   LUMBAR FUSION  12/11/2020   L1-2-3-4   LUMBAR LAMINECTOMY/DECOMPRESSION MICRODISCECTOMY N/A 11/11/2021   Procedure: LUMBAR THREE-FOUR SUBLAMINAR DECOMPRESSION;  Surgeon: Colon Shove, MD;  Location: [facility] OR;  Service: Neurosurgery;  Laterality: N/A;   TONSILLECTOMY     ULTRASOUND GUIDANCE FOR VASCULAR ACCESS  03/02/2018   Procedure: Ultrasound Guidance For Vascular Access;  Surgeon: Swaziland, Peter M, MD;  Location: [facility] INVASIVE CV LAB;  Service: Cardiovascular;;   Social History:  reports that he has been smoking cigarettes. He started smoking about 57 years ago. He has a 35 pack-year smoking history. He has never used smokeless tobacco. He reports current alcohol use of about 4.0 standard drinks of alcohol per week. He reports that he does not use drugs.  Allergies  Allergen Reactions   Penicillins Anaphylaxis, Swelling and Rash   Hydrocodone Nausea Only    Sick on stomach all day long    Oxycodone  Nausea Only    Sick on stomach all day long    Zolpidem Other (See Comments)    dizziness   Januvia [Sitagliptin] Nausea And Vomiting and Rash    Full body rash    Family History  Problem  Relation Age of Onset   Diabetes Mother    Cancer Father    Suicidality Brother     Prior to Admission medications   Medication Sig Start Date End Date Taking? Authorizing Provider  allopurinol  (ZYLOPRIM ) 100 MG tablet Take 100 mg by mouth daily. 01/20/18  Yes [provider]  amLODipine  (NORVASC ) 10 MG tablet TAKE ONE TABLET BY MOUTH ONCE DAILY 09/28/23  Yes Krasowski, Robert J, MD  aspirin  EC 81 MG tablet Take 1 tablet (81 mg total) by mouth daily. 11/17/16  Yes Krasowski, Robert J, MD  clopidogrel  (PLAVIX ) 75 MG tablet TAKE ONE TABLET BY MOUTH ONCE DAILY WITH BREAKFAST 07/12/23  Yes Krasowski, Robert J, MD   cyanocobalamin (,VITAMIN B-12,) 1000 MCG/ML injection Inject 1,000 mcg into the muscle every 30 (thirty) days. 07/31/21  Yes [provider]  gabapentin  (NEURONTIN ) 300 MG capsule Take 300 mg by mouth 3 (three) times daily. 11/05/23  Yes [provider]  glipiZIDE  (GLUCOTROL  XL) 5 MG 24 hr tablet Take 5 mg by mouth daily. 08/30/23  Yes [provider]  isosorbide  mononitrate (IMDUR ) 60 MG 24 hr tablet Take 1 tablet (60 mg total) by mouth daily. 01/25/23  Yes Krasowski, Robert J, MD  methocarbamol  (ROBAXIN ) 500 MG tablet Take 1 tablet (500 mg total) by mouth every 6 (six) hours as needed for muscle spasms. 02/24/23  Yes Colon Shove, MD  metoprolol  tartrate (LOPRESSOR ) 50 MG tablet TAKE 1 AND 1/2 TABLETS BY MOUTH TWICE DAILY 01/25/23  Yes Krasowski, Robert J, MD  naproxen (NAPROSYN) 500 MG tablet Take 500 mg by mouth 2 (two) times daily as needed for mild pain (pain score 1-3) or moderate pain (pain score 4-6). 10/07/23  Yes [provider]  nitroGLYCERIN  (NITROSTAT ) 0.4 MG SL tablet DISSOLVE 1 TABLET UNDER THE TONGUE EVERY 5 MINUTES AS NEEDED FOR CHEST PAIN. DO NOT EXCEED A TOTAL OF 3 DOSES IN 15 MINUTES. Patient taking differently: Place 0.4 mg under the tongue every 5 (five) minutes as needed for chest pain. 09/02/22  Yes Krasowski, Robert J, MD  ranolazine  (RANEXA ) 1000 MG SR tablet Take 1 tablet (1,000 mg total) by mouth 2 (two) times daily. 09/28/23  Yes Krasowski, Robert J, MD  rosuvastatin  (CRESTOR ) 40 MG tablet TAKE ONE TABLET BY MOUTH ONCE DAILY 06/28/23  Yes Krasowski, Robert J, MD  tiZANidine  (ZANAFLEX ) 4 MG capsule Take 4 mg by mouth 3 (three) times daily as needed for muscle spasms.   Yes [provider]  VASCEPA  1 g capsule Take 2 capsules (2 g total) by mouth 2 times daily at 12 noon and 4 pm. 10/08/20  Yes Hilty, Vinie BROCKS, MD  Vitamin D , Ergocalciferol , (DRISDOL ) 50000 units CAPS capsule Take 50,000 Units by mouth every Sunday.  08/02/17  Yes  [provider]    Physical Exam: Vitals:   11/17/23 0934 11/17/23 1200 11/17/23 1519 11/17/23 1520  BP: (!) 125/108 130/80 128/80   Pulse: 85 90 93 92  Resp:      Temp:  99.1 F (37.3 C)  98.9 F (37.2 C)  TempSrc:  Oral    SpO2:  (!) 89% 91% 91%  Weight:      Height:       Constitutional: Acutely ill looking, NAD, calm, comfortable Eyes: PERRL, lids and conjunctivae normal ENMT: Mucous membranes are moist. Posterior pharynx clear of any exudate or lesions.Normal dentition.  Neck: normal, supple, no masses, no thyromegaly Respiratory: Decreased air entry bilaterally with marked expiratory wheezing no accessory muscle  use.  Cardiovascular: Regular rate and rhythm, no murmurs / rubs / gallops. No extremity edema. 2+ pedal pulses. No carotid bruits.  Abdomen: no tenderness, no masses palpated. No hepatosplenomegaly. Bowel sounds positive.  Musculoskeletal: Good range of motion, no joint swelling or tenderness, Skin: no rashes, lesions, ulcers. No induration Neurologic: CN 2-12 grossly intact. Sensation intact, DTR normal. Strength 5/5 in all 4.  Psychiatric: Normal judgment and insight. Alert and oriented x 3. Normal mood  Data Reviewed:   Blood pressure 125/108, pulse 90, oxygen sat 89% on room air, sodium 132, chloride 97 glucose 214 creatinine 1.54 calcium  8.5, white count 21.3 hemoglobin 8.6 and platelets 105    Family Communication: No family at bedside Primary team communication: Call tried hospitalist team on-call Thank you very much for involving us  in the care of your patient.  AuthorBETHA SIM KNOLL, MD 11/17/2023 9:14 PM  For on call review [email or web address].

## 2023-11-17 NOTE — Progress Notes (Signed)
 Physical Therapy Treatment Patient Details Name: Robert Ramos MRN: [id number] DOB: 1950/06/03 Today's Date: 11/17/2023   History of Present Illness Robert Ramos is a 73 yo male who underwent surgical decompression at L1-3 with posterolateral arthrodesis and pedicle screw fixation 11/15/23. PMH includes: Arthritis, Gout, HTN, Hyperlipidemia, CAD, previous lumbar surgery, CKD, tremor    PT Comments  Patient on 3L of oxygen on arrival with sats 90%. Pt able to recall 3/3 back precautions and come to sit EOB with CGA and heavy use of rails (he reports he will sleep in recliner at home). Sitting he removed his oxygen and ultimately dropped to 83% with 3L resumed prior to standing. Ambulated x75 ft with RW and CGA with sats 89%. Limited by LE soreness and back pain. Pt reports taking a long walk last evening with nursing (longer walk than he could tolerate pre-op) and legs are very sore today (not cramping, but muscle soreness). Patient up to chair with breakfast and wife present.    If plan is discharge home, recommend the following: A little help with walking and/or transfers;Assistance with cooking/housework;Assist for transportation;Help with stairs or ramp for entrance   Can travel by private vehicle        Equipment Recommendations  Rolling walker (2 wheels)    Recommendations for Other Services       Precautions / Restrictions Precautions Precautions: Back;Fall Precaution Booklet Issued: Yes (comment) Recall of Precautions/Restrictions: Intact Required Braces or Orthoses: Spinal Brace Spinal Brace: Lumbar corset;Applied in sitting position Restrictions Weight Bearing Restrictions Per Provider Order: No     Mobility  Bed Mobility Overal bed mobility: Needs Assistance Bed Mobility: Rolling, Sidelying to Sit Rolling: Supervision, Used rails Sidelying to sit: Contact guard assist, HOB elevated, Used rails       General bed mobility comments: HOB ~15; min cues for  technique; heavy use of rails; pt reports he usually sleeps in his recliner post-surgery    Transfers Overall transfer level: Needs assistance Equipment used: Rolling walker (2 wheels) Transfers: Sit to/from Stand Sit to Stand: Min assist           General transfer comment: vc for hand placement (pt pulling up on RW with wife instructed to stabilize RW); steadying assist    Ambulation/Gait Ambulation/Gait assistance: Contact guard assist Gait Distance (Feet): 75 Feet Assistive device: Rolling walker (2 wheels) Gait Pattern/deviations: Step-through pattern, Decreased stride length, Trunk flexed       General Gait Details: pt reporting bil thighs and calves very sore after marathon walk last night with RN   Stairs             Wheelchair Mobility     Tilt Bed    Modified Rankin (Stroke Patients Only)       Balance Overall balance assessment: Needs assistance Sitting-balance support: No upper extremity supported, Feet supported Sitting balance-Leahy Scale: Fair     Standing balance support: Bilateral upper extremity supported Standing balance-Leahy Scale: Poor                              Communication Communication Communication: No apparent difficulties  Cognition Arousal: Alert Behavior During Therapy: WFL for tasks assessed/performed   PT - Cognitive impairments: No apparent impairments                         Following commands: Intact      Cueing Cueing Techniques: Verbal cues  Exercises      General Comments General comments (skin integrity, edema, etc.): Pt reports he got up to chair and back to bed with wife's assistance      Pertinent Vitals/Pain Pain Assessment Pain Assessment: 0-10 Pain Score: 10-Worst pain ever Pain Location: back and thighs Pain Descriptors / Indicators: Aching, Constant, Sore Pain Intervention(s): Limited activity within patient's tolerance, Monitored during session, Repositioned     Home Living                          Prior Function            PT Goals (current goals can now be found in the care plan section) Acute Rehab PT Goals Patient Stated Goal: decr pain Time For Goal Achievement: 11/30/23 Potential to Achieve Goals: Good Progress towards PT goals: Progressing toward goals    Frequency    Min 5X/week      PT Plan      Co-evaluation              AM-PAC PT 6 Clicks Mobility   Outcome Measure  Help needed turning from your back to your side while in a flat bed without using bedrails?: A Little Help needed moving from lying on your back to sitting on the side of a flat bed without using bedrails?: A Little Help needed moving to and from a bed to a chair (including a wheelchair)?: A Little Help needed standing up from a chair using your arms (e.g., wheelchair or bedside chair)?: A Little Help needed to walk in hospital room?: A Little Help needed climbing 3-5 steps with a railing? : A Lot 6 Click Score: 17    End of Session Equipment Utilized During Treatment: Gait belt;Back brace;Oxygen Activity Tolerance: Patient limited by pain Patient left: in chair;with call bell/phone within reach;with family/visitor present Nurse Communication: Mobility status;Other (comment) (required O2; not to get up with wife) PT Visit Diagnosis: Other abnormalities of gait and mobility (R26.89);Pain Pain - part of body:  (back and neck)     Time: 0802-0824 PT Time Calculation (min) (ACUTE ONLY): 22 min  Charges:    $Gait Training: 8-22 mins PT General Charges $$ ACUTE PT VISIT: 1 Visit                      Robert Ramos, PT Acute Rehabilitation Services  Office [phone]    Robert Ramos 11/17/2023, 8:30 AM

## 2023-11-17 NOTE — Progress Notes (Signed)
 Notified of patient's low sats on 6LNC, ordered chest xray. Paged hospitalist to consult for medical management given his very extensive medical history.

## 2023-11-17 NOTE — Plan of Care (Signed)
 Problem: Education: Goal: Knowledge of General Education information will improve Description: Including pain rating scale, medication(s)/side effects and non-pharmacologic comfort measures 11/17/2023 0051 by Marvis Kenneth SAILOR, RN Outcome: Progressing 11/16/2023 1952 by Marvis Kenneth SAILOR, RN Outcome: Progressing   Problem: Health Behavior/Discharge Planning: Goal: Ability to manage health-related needs will improve 11/17/2023 0051 by Marvis Kenneth SAILOR, RN Outcome: Progressing 11/16/2023 1952 by Marvis Kenneth SAILOR, RN Outcome: Progressing   Problem: Clinical Measurements: Goal: Ability to maintain clinical measurements within normal limits will improve 11/17/2023 0051 by Marvis Kenneth SAILOR, RN Outcome: Progressing 11/16/2023 1952 by Marvis Kenneth SAILOR, RN Outcome: Progressing Goal: Will remain free from infection 11/17/2023 0051 by Marvis Kenneth SAILOR, RN Outcome: Progressing 11/16/2023 1952 by Marvis Kenneth SAILOR, RN Outcome: Progressing Goal: Diagnostic test results will improve 11/17/2023 0051 by Marvis Kenneth SAILOR, RN Outcome: Progressing 11/16/2023 1952 by Marvis Kenneth SAILOR, RN Outcome: Progressing Goal: Respiratory complications will improve 11/17/2023 0051 by Marvis Kenneth SAILOR, RN Outcome: Progressing 11/16/2023 1952 by Marvis Kenneth SAILOR, RN Outcome: Progressing Goal: Cardiovascular complication will be avoided 11/17/2023 0051 by Marvis Kenneth SAILOR, RN Outcome: Progressing 11/16/2023 1952 by Marvis Kenneth SAILOR, RN Outcome: Progressing   Problem: Activity: Goal: Risk for activity intolerance will decrease 11/17/2023 0051 by Marvis Kenneth SAILOR, RN Outcome: Progressing 11/16/2023 1952 by Marvis Kenneth SAILOR, RN Outcome: Progressing   Problem: Nutrition: Goal: Adequate nutrition will be maintained 11/17/2023 0051 by Marvis Kenneth SAILOR, RN Outcome: Progressing 11/16/2023 1952 by Marvis Kenneth SAILOR, RN Outcome: Progressing   Problem: Coping: Goal: Level of anxiety will decrease 11/17/2023  0051 by Marvis Kenneth SAILOR, RN Outcome: Progressing 11/16/2023 1952 by Marvis Kenneth SAILOR, RN Outcome: Progressing   Problem: Elimination: Goal: Will not experience complications related to bowel motility 11/17/2023 0051 by Marvis Kenneth SAILOR, RN Outcome: Progressing 11/16/2023 1952 by Marvis Kenneth SAILOR, RN Outcome: Progressing Goal: Will not experience complications related to urinary retention 11/17/2023 0051 by Marvis Kenneth SAILOR, RN Outcome: Progressing 11/16/2023 1952 by Marvis Kenneth SAILOR, RN Outcome: Progressing   Problem: Pain Managment: Goal: General experience of comfort will improve and/or be controlled 11/17/2023 0051 by Marvis Kenneth SAILOR, RN Outcome: Progressing 11/16/2023 1952 by Marvis Kenneth SAILOR, RN Outcome: Progressing   Problem: Safety: Goal: Ability to remain free from injury will improve 11/17/2023 0051 by Marvis Kenneth SAILOR, RN Outcome: Progressing 11/16/2023 1952 by Marvis Kenneth SAILOR, RN Outcome: Progressing   Problem: Skin Integrity: Goal: Risk for impaired skin integrity will decrease 11/17/2023 0051 by Marvis Kenneth SAILOR, RN Outcome: Progressing 11/16/2023 1952 by Marvis Kenneth SAILOR, RN Outcome: Progressing   Problem: Education: Goal: Ability to verbalize activity precautions or restrictions will improve 11/17/2023 0051 by Marvis Kenneth SAILOR, RN Outcome: Progressing 11/16/2023 1952 by Marvis Kenneth SAILOR, RN Outcome: Progressing Goal: Knowledge of the prescribed therapeutic regimen will improve 11/17/2023 0051 by Marvis Kenneth SAILOR, RN Outcome: Progressing 11/16/2023 1952 by Marvis Kenneth SAILOR, RN Outcome: Progressing Goal: Understanding of discharge needs will improve 11/17/2023 0051 by Marvis Kenneth SAILOR, RN Outcome: Progressing 11/16/2023 1952 by Marvis Kenneth SAILOR, RN Outcome: Progressing   Problem: Activity: Goal: Ability to avoid complications of mobility impairment will improve 11/17/2023 0051 by Marvis Kenneth SAILOR, RN Outcome: Progressing 11/16/2023 1952 by  Marvis Kenneth SAILOR, RN Outcome: Progressing Goal: Ability to tolerate increased activity will improve 11/17/2023 0051 by Marvis Kenneth SAILOR, RN Outcome: Progressing 11/16/2023 1952 by Marvis Kenneth SAILOR, RN Outcome: Progressing Goal: Will remain free from falls 11/17/2023 0051 by Marvis Kenneth SAILOR, RN Outcome: Progressing 11/16/2023 1952 by  Marvis Kenneth SAILOR, RN Outcome: Progressing   Problem: Bowel/Gastric: Goal: Gastrointestinal status for postoperative course will improve 11/17/2023 0051 by Marvis Kenneth SAILOR, RN Outcome: Progressing 11/16/2023 1952 by Marvis Kenneth SAILOR, RN Outcome: Progressing   Problem: Clinical Measurements: Goal: Ability to maintain clinical measurements within normal limits will improve 11/17/2023 0051 by Marvis Kenneth SAILOR, RN Outcome: Progressing 11/16/2023 1952 by Marvis Kenneth SAILOR, RN Outcome: Progressing Goal: Postoperative complications will be avoided or minimized 11/17/2023 0051 by Marvis Kenneth SAILOR, RN Outcome: Progressing 11/16/2023 1952 by Marvis Kenneth SAILOR, RN Outcome: Progressing Goal: Diagnostic test results will improve 11/17/2023 0051 by Marvis Kenneth SAILOR, RN Outcome: Progressing 11/16/2023 1952 by Marvis Kenneth SAILOR, RN Outcome: Progressing   Problem: Pain Management: Goal: Pain level will decrease 11/17/2023 0051 by Marvis Kenneth SAILOR, RN Outcome: Progressing 11/16/2023 1952 by Marvis Kenneth SAILOR, RN Outcome: Progressing   Problem: Skin Integrity: Goal: Will show signs of wound healing 11/17/2023 0051 by Marvis Kenneth SAILOR, RN Outcome: Progressing 11/16/2023 1952 by Marvis Kenneth SAILOR, RN Outcome: Progressing   Problem: Health Behavior/Discharge Planning: Goal: Identification of resources available to assist in meeting health care needs will improve 11/17/2023 0051 by Marvis Kenneth SAILOR, RN Outcome: Progressing 11/16/2023 1952 by Marvis Kenneth SAILOR, RN Outcome: Progressing   Problem: Bladder/Genitourinary: Goal: Urinary functional status for  postoperative course will improve 11/17/2023 0051 by Marvis Kenneth SAILOR, RN Outcome: Progressing 11/16/2023 1952 by Marvis Kenneth SAILOR, RN Outcome: Progressing

## 2023-11-17 NOTE — Plan of Care (Signed)

## 2023-11-17 NOTE — Progress Notes (Signed)
 Occupational Therapy Treatment Patient Details Name: Robert Ramos MRN: [id number] DOB: Mar 18, 1951 Today's Date: 11/17/2023   History of present illness Robert Ramos is a 73 yo male who underwent surgical decompression at L1-3 with posterolateral arthrodesis and pedicle screw fixation 11/15/23. PMH includes: Arthritis, Gout, HTN, Hyperlipidemia, CAD, previous lumbar surgery, CKD, tremor   OT comments  Pt making slow progress toward goals, incr O2 demand this session needing up to 8L O2 to maintain SpO2 in low 90s with ambulation. Pt anxious, cues for PLB during session. Encouraged IS use and OOB to chair during the day. Pt needing min A for donning back brace at EOB, spouse present and supportive. Pt presenting with impairments listed below, will follow acutely. Recommend HHOT at d/c pending progression.      If plan is discharge home, recommend the following:  A little help with walking and/or transfers;A little help with bathing/dressing/bathroom;Assistance with cooking/housework;Assist for transportation;Help with stairs or ramp for entrance   Equipment Recommendations  Other (comment) (RW)    Recommendations for Other Services PT consult    Precautions / Restrictions Precautions Precautions: Back;Fall Precaution Booklet Issued: Yes (comment) Recall of Precautions/Restrictions: Intact Required Braces or Orthoses: Spinal Brace Spinal Brace: Lumbar corset;Applied in sitting position Restrictions Weight Bearing Restrictions Per Provider Order: No       Mobility Bed Mobility Overal bed mobility: Needs Assistance Bed Mobility: Rolling, Sidelying to Sit Rolling: Supervision, Used rails Sidelying to sit: Contact guard assist, HOB elevated, Used rails            Transfers Overall transfer level: Needs assistance Equipment used: Rolling walker (2 wheels) Transfers: Sit to/from Stand Sit to Stand: Min assist                 Balance Overall balance assessment:  Needs assistance Sitting-balance support: No upper extremity supported, Feet supported Sitting balance-Leahy Scale: Good     Standing balance support: Bilateral upper extremity supported Standing balance-Leahy Scale: Poor                             ADL either performed or assessed with clinical judgement   ADL Overall ADL's : Needs assistance/impaired                 Upper Body Dressing : Minimal assistance;Sitting Upper Body Dressing Details (indicate cue type and reason): donning back brace     Toilet Transfer: Contact guard assist;Rolling walker (2 wheels)           Functional mobility during ADLs: Contact guard assist;Rolling walker (2 wheels)      Extremity/Trunk Assessment Upper Extremity Assessment Upper Extremity Assessment: Generalized weakness   Lower Extremity Assessment Lower Extremity Assessment: Defer to PT evaluation        Vision   Vision Assessment?: No apparent visual deficits   Perception Perception Perception: Not tested   Praxis Praxis Praxis: Not tested   Communication Communication Communication: No apparent difficulties   Cognition Arousal: Alert Behavior During Therapy: WFL for tasks assessed/performed Cognition: No apparent impairments                               Following commands: Intact        Cueing   Cueing Techniques: Verbal cues  Exercises      Shoulder Instructions       General Comments pt needing up to 8L O2 to  maintain SpO2 in low 90s    Pertinent Vitals/ Pain       Pain Assessment Pain Assessment: Faces Pain Score: 5  Faces Pain Scale: Hurts even more Pain Location: anterior thighs Pain Descriptors / Indicators: Aching, Constant, Sore Pain Intervention(s): Limited activity within patient's tolerance  Home Living                                          Prior Functioning/Environment              Frequency  Min 2X/week        Progress  Toward Goals  OT Goals(current goals can now be found in the care plan section)  Progress towards OT goals: Progressing toward goals  Acute Rehab OT Goals Patient Stated Goal: none stated OT Goal Formulation: With patient Time For Goal Achievement: 11/30/23 Potential to Achieve Goals: Good ADL Goals Pt Will Perform Upper Body Dressing: with supervision;sitting Pt Will Perform Lower Body Dressing: with supervision;sit to/from stand;sitting/lateral leans Pt Will Transfer to Toilet: with supervision;ambulating;regular height toilet Pt Will Perform Tub/Shower Transfer: Shower transfer;with supervision;ambulating  Plan      Co-evaluation                 AM-PAC OT 6 Clicks Daily Activity     Outcome Measure   Help from another person eating meals?: A Little Help from another person taking care of personal grooming?: A Little Help from another person toileting, which includes using toliet, bedpan, or urinal?: A Little Help from another person bathing (including washing, rinsing, drying)?: A Little Help from another person to put on and taking off regular upper body clothing?: A Little Help from another person to put on and taking off regular lower body clothing?: A Little 6 Click Score: 18    End of Session Equipment Utilized During Treatment: Gait belt;Rolling walker (2 wheels);Back brace  OT Visit Diagnosis: Unsteadiness on feet (R26.81);Other abnormalities of gait and mobility (R26.89);Muscle weakness (generalized) (M62.81)   Activity Tolerance Patient tolerated treatment well   Patient Left in chair;with call bell/phone within reach   Nurse Communication Mobility status        Time: 8671-8640 OT Time Calculation (min): 31 min  Charges: OT General Charges $OT Visit: 1 Visit OT Treatments $Self Care/Home Management : 8-22 mins $Therapeutic Activity: 8-22 mins  Siani Utke K, OTD, OTR/L SecureChat Preferred Acute Rehab [phone]   Laneta POUR  Koonce 11/17/2023, 3:14 PM

## 2023-11-17 NOTE — TOC Initial Note (Signed)
 Transition of Care (TOC) - Initial/Assessment Note  Rayfield Gobble RN,BSN Inpatient Care Management Unit 4NP (Non Trauma)- RN Case Manager See Treatment Team for direct Phone #   Patient Details  Name: Robert Ramos MRN: [id number] Date of Birth: 1951/03/04  Transition of Care [facility]) CM/SW Contact:    Gobble Rayfield Hurst, RN Phone Number: 11/17/2023, 12:44 PM  Clinical Narrative:                 Pt s/p lumbar fusion secondary to spinal stenosis. From home. Noted recommendation by therapy for DME- RW.   CM in to speak with pt, wife also present. Discussed DME recommendation- confirmed pt has both Worker's Comp coverage ([employer]) and [employer] Medicare plan. Offered choice for RW coverage w/ WC vs Medicare plan as well as getting it here prior to discharge vs pt getting one himself. Pt/wife would like to look into options and CM will f/u in am to see if they need RW arranged prior to discharge.  Order placed for co-sign per [facility] referral for DME needs.   No HH or outpt needs noted. Wife will transport home.   Pt uses recliner at home and will speak with therapy here for suggestions on how to get in/out of recliner at home.   Expected Discharge Plan: Home/Self Care Barriers to Discharge: Continued Medical Work up   Patient Goals and CMS Choice Patient states their goals for this hospitalization and ongoing recovery are:: return home   Choice offered to / list presented to : NA      Expected Discharge Plan and Services   Discharge Planning Services: CM Consult Post Acute Care Choice: Durable Medical Equipment Living arrangements for the past 2 months: Single Family Home                 DME Arranged: Walker rolling         HH Arranged: NA HH Agency: NA        Prior Living Arrangements/Services Living arrangements for the past 2 months: Single Family Home Lives with:: Spouse Patient language and need for interpreter reviewed:: Yes Do you feel safe  going back to the place where you live?: Yes      Need for Family Participation in Patient Care: Yes (Comment) Care giver support system in place?: Yes (comment)   Criminal Activity/Legal Involvement Pertinent to Current Situation/Hospitalization: No - Comment as needed  Activities of Daily Living   ADL Screening (condition at time of admission) Independently performs ADLs?: Yes (appropriate for developmental age) Is the patient deaf or have difficulty hearing?: Yes Does the patient have difficulty seeing, even when wearing glasses/contacts?: No Does the patient have difficulty concentrating, remembering, or making decisions?: No  Permission Sought/Granted                  Emotional Assessment Appearance:: Appears stated age Attitude/Demeanor/Rapport: Engaged Affect (typically observed): Accepting Orientation: : Oriented to Self, Oriented to Place, Oriented to  Time, Oriented to Situation Alcohol / Substance Use: Not Applicable Psych Involvement: No (comment)  Admission diagnosis:  Spinal stenosis of lumbar region with neurogenic claudication [M48.062] S/P lumbar fusion [Z98.1] S/P lumbar spine operation [Z98.890] Patient Active Problem List   Diagnosis Date Noted   Spinal stenosis of lumbar region with neurogenic claudication 11/15/2023   S/P lumbar fusion 11/15/2023   S/P lumbar spine operation 11/15/2023   Chronic radicular low back pain    Dyspnea    GERD (gastroesophageal reflux disease)    Hypertension  Pneumonia    B12 deficiency 05/21/2022   Lumbar stenosis with neurogenic claudication 11/11/2021   Lumbar foraminal stenosis 12/06/2020   Facial lesion 11/29/2020   Hyperlipidemia    History of kidney stones    Gout    Arthritis    Tremor 05/30/2020   Lumbago with sciatica, right side 01/22/2020   Chronic pain syndrome 01/22/2020   Chronic bilateral low back pain with bilateral sciatica 01/22/2020   Anemia 09/28/2019   Chronic pain of left knee  09/28/2019   Lumbar post-laminectomy syndrome 06/08/2019   Disc displacement, lumbar 05/10/2019   Body mass index (BMI) 34.0-34.9, adult 03/15/2019   Radiculopathy, lumbar region 03/15/2019   Non-seasonal allergic rhinitis 07/04/2018   Precordial chest pain 11/24/2017   Coronary artery disease 11/17/2016   Dyslipidemia 11/17/2016   Obstructive sleep apnea 11/17/2016   Shortness of breath 11/17/2016   Spinal stenosis, 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   Mixed hyperlipidemia 08/27/2015   Essential hypertension 08/27/2015   Kidney stone 08/27/2015   Long-term use of high-risk medication 08/27/2015   Malaise and fatigue 08/27/2015   Prediabetes 08/27/2015   Stage 3 chronic kidney disease (HCC) 08/27/2015   ASHD (arteriosclerotic heart disease) 08/27/2015   Heart attack (HCC) 2002   PCP:  Thurmond Cathlyn LABOR., MD Pharmacy:   [facility] - [address] [place] - [address] [place] [address] Phone: [phone] Fax: [phone]     Social Drivers of Health (SDOH) Social History: SDOH Screenings   Food Insecurity: No Food Insecurity (11/15/2023)  Housing: Low Risk  (11/15/2023)  Transportation Needs: No Transportation Needs (11/15/2023)  Utilities: Not At Risk (11/15/2023)  Social Connections: Socially Integrated (11/15/2023)  Tobacco Use: High Risk (11/12/2023)   SDOH Interventions:     Readmission Risk Interventions     No data to display

## 2023-11-17 NOTE — Progress Notes (Signed)
 Patient ID: Robert Ramos, male   DOB: 23-Jul-1950, 73 y.o.   MRN: [id number] Patient is progressing with recovery however complains of significant soreness in proximal lower extremities.  Incision is clean and dry.  Patient may shower if he so desires he has required some supplemental oxygen and I like to have him wean away from this before his discharge.  Had some low-dose Decadron  to see if this helps ease his pain and aid his recovery.  Probable discharge tomorrow

## 2023-11-18 ENCOUNTER — Inpatient Hospital Stay (HOSPITAL_COMMUNITY)

## 2023-11-18 LAB — COMPREHENSIVE METABOLIC PANEL WITH GFR
ALT: 26 U/L (ref 0–44)
AST: 51 U/L — ABNORMAL HIGH (ref 15–41)
Albumin: 3.1 g/dL — ABNORMAL LOW (ref 3.5–5.0)
Alkaline Phosphatase: 37 U/L — ABNORMAL LOW (ref 38–126)
Anion gap: 11 (ref 5–15)
BUN: 16 mg/dL (ref 8–23)
CO2: 22 mmol/L (ref 22–32)
Calcium: 9 mg/dL (ref 8.9–10.3)
Chloride: 96 mmol/L — ABNORMAL LOW (ref 98–111)
Creatinine, Ser: 1.32 mg/dL — ABNORMAL HIGH (ref 0.61–1.24)
GFR, Estimated: 57 mL/min — ABNORMAL LOW (ref >60)
Glucose, Bld: 187 mg/dL — ABNORMAL HIGH (ref 70–99)
Potassium: 4.5 mmol/L (ref 3.5–5.1)
Sodium: 129 mmol/L — ABNORMAL LOW (ref 135–145)
Total Bilirubin: 1 mg/dL (ref 0.0–1.2)
Total Protein: 6.7 g/dL (ref 6.5–8.1)

## 2023-11-18 LAB — BLOOD GAS, VENOUS
Acid-Base Excess: 2 mmol/L (ref 0.0–2.0)
Bicarbonate: 27.8 mmol/L (ref 20.0–28.0)
Drawn by: 59174
O2 Saturation: 55.5 %
Patient temperature: 37.1
pCO2, Ven: 47 mmHg (ref 44–60)
pH, Ven: 7.38 (ref 7.25–7.43)
pO2, Ven: 33 mmHg (ref 32–45)

## 2023-11-18 LAB — CBC
HCT: 22.9 % — ABNORMAL LOW (ref 39.0–52.0)
Hemoglobin: 7.5 g/dL — ABNORMAL LOW (ref 13.0–17.0)
MCH: 32.2 pg (ref 26.0–34.0)
MCHC: 32.8 g/dL (ref 30.0–36.0)
MCV: 98.3 fL (ref 80.0–100.0)
Platelets: 70 K/uL — ABNORMAL LOW (ref 150–400)
RBC: 2.33 MIL/uL — ABNORMAL LOW (ref 4.22–5.81)
RDW: 14.9 % (ref 11.5–15.5)
WBC: 13.7 K/uL — ABNORMAL HIGH (ref 4.0–10.5)
nRBC: 0 % (ref 0.0–0.2)

## 2023-11-18 LAB — GLUCOSE, CAPILLARY
Glucose-Capillary: 196 mg/dL — ABNORMAL HIGH (ref 70–99)
Glucose-Capillary: 221 mg/dL — ABNORMAL HIGH (ref 70–99)
Glucose-Capillary: 243 mg/dL — ABNORMAL HIGH (ref 70–99)
Glucose-Capillary: 159 mg/dL — ABNORMAL HIGH (ref 70–99)

## 2023-11-18 LAB — TROPONIN I (HIGH SENSITIVITY)
Troponin I (High Sensitivity): 8 ng/L (ref <18)
Troponin I (High Sensitivity): 10 ng/L (ref <18)

## 2023-11-18 LAB — HIV ANTIBODY (ROUTINE TESTING W REFLEX): HIV Screen 4th Generation wRfx: NONREACTIVE

## 2023-11-18 LAB — PROCALCITONIN: Procalcitonin: 0.4 ng/mL

## 2023-11-18 LAB — STREP PNEUMONIAE URINARY ANTIGEN: Strep Pneumo Urinary Antigen: NEGATIVE

## 2023-11-18 LAB — EXPECTORATED SPUTUM ASSESSMENT W GRAM STAIN, RFLX TO RESP C

## 2023-11-18 LAB — BRAIN NATRIURETIC PEPTIDE: B Natriuretic Peptide: 187.5 pg/mL — ABNORMAL HIGH (ref 0.0–100.0)

## 2023-11-18 LAB — D-DIMER, QUANTITATIVE: D-Dimer, Quant: 1.54 ug{FEU}/mL — ABNORMAL HIGH (ref 0.00–0.50)

## 2023-11-18 MED ORDER — CLOPIDOGREL BISULFATE 75 MG PO TABS
75.0000 mg | ORAL_TABLET | Freq: Every day | ORAL | Status: DC
  Administered 2023-11-18 – 2023-11-20 (×2): 75 mg via ORAL
  Filled 2023-11-18 (×3): qty 1

## 2023-11-18 MED ORDER — BUDESONIDE 0.25 MG/2ML IN SUSP
0.2500 mg | Freq: Two times a day (BID) | RESPIRATORY_TRACT | Status: DC
  Administered 2023-11-18 – 2023-11-20 (×5): 0.25 mg via RESPIRATORY_TRACT
  Filled 2023-11-18 (×5): qty 2

## 2023-11-18 MED ORDER — IOHEXOL 350 MG/ML SOLN
75.0000 mL | Freq: Once | INTRAVENOUS | Status: AC | PRN
  Administered 2023-11-18: 75 mL via INTRAVENOUS

## 2023-11-18 MED ORDER — ENOXAPARIN SODIUM 40 MG/0.4ML IJ SOSY
40.0000 mg | PREFILLED_SYRINGE | Freq: Every day | INTRAMUSCULAR | Status: DC
  Administered 2023-11-18 – 2023-11-20 (×3): 40 mg via SUBCUTANEOUS
  Filled 2023-11-18 (×3): qty 0.4

## 2023-11-18 MED ORDER — ASPIRIN 81 MG PO TBEC
81.0000 mg | DELAYED_RELEASE_TABLET | Freq: Every day | ORAL | Status: DC
  Administered 2023-11-18 – 2023-11-20 (×2): 81 mg via ORAL
  Filled 2023-11-18 (×3): qty 1

## 2023-11-18 MED ORDER — REVEFENACIN 175 MCG/3ML IN SOLN
175.0000 ug | Freq: Every day | RESPIRATORY_TRACT | Status: DC
  Administered 2023-11-18 – 2023-11-20 (×3): 175 ug via RESPIRATORY_TRACT
  Filled 2023-11-18 (×3): qty 3

## 2023-11-18 MED ORDER — LEVOFLOXACIN IN D5W 750 MG/150ML IV SOLN
750.0000 mg | INTRAVENOUS | Status: DC
  Administered 2023-11-18 – 2023-11-19 (×2): 750 mg via INTRAVENOUS
  Filled 2023-11-18 (×3): qty 150

## 2023-11-18 MED ORDER — MAGNESIUM HYDROXIDE 400 MG/5ML PO SUSP
30.0000 mL | Freq: Every day | ORAL | Status: DC
  Administered 2023-11-18 – 2023-11-20 (×3): 30 mL via ORAL
  Filled 2023-11-18 (×3): qty 30

## 2023-11-18 MED ORDER — INSULIN ASPART 100 UNIT/ML IJ SOLN: 0.0000 [IU] | Freq: Every day | INTRAMUSCULAR | Status: DC

## 2023-11-18 MED ORDER — ALBUTEROL SULFATE (2.5 MG/3ML) 0.083% IN NEBU: 2.5000 mg | INHALATION_SOLUTION | RESPIRATORY_TRACT | Status: DC | PRN

## 2023-11-18 MED ORDER — INSULIN ASPART 100 UNIT/ML IJ SOLN
0.0000 [IU] | Freq: Three times a day (TID) | INTRAMUSCULAR | Status: DC
  Administered 2023-11-18 – 2023-11-19 (×4): 5 [IU] via SUBCUTANEOUS
  Administered 2023-11-19 – 2023-11-20 (×3): 3 [IU] via SUBCUTANEOUS

## 2023-11-18 MED ORDER — ARFORMOTEROL TARTRATE 15 MCG/2ML IN NEBU
15.0000 ug | INHALATION_SOLUTION | Freq: Two times a day (BID) | RESPIRATORY_TRACT | Status: DC
  Administered 2023-11-18 – 2023-11-20 (×5): 15 ug via RESPIRATORY_TRACT
  Filled 2023-11-18 (×5): qty 2

## 2023-11-18 NOTE — Significant Event (Signed)
 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 But no PE.  Levaquin  started along with pulm toiletting. Monitor closely PU.

## 2023-11-18 NOTE — Plan of Care (Signed)
   Problem: Education: Goal: Knowledge of General Education information will improve Description: Including pain rating scale, medication(s)/side effects and non-pharmacologic comfort measures Outcome: Progressing   Problem: Health Behavior/Discharge Planning: Goal: Ability to manage health-related needs will improve Outcome: Progressing   Problem: Clinical Measurements: Goal: Ability to maintain clinical measurements within normal limits will improve Outcome: Progressing Goal: Will remain free from infection Outcome: Progressing Goal: Diagnostic test results will improve Outcome: Progressing Goal: Respiratory complications will improve Outcome: Progressing Goal: Cardiovascular complication will be avoided Outcome: Progressing   Problem: Activity: Goal: Risk for activity intolerance will decrease Outcome: Progressing   Problem: Nutrition: Goal: Adequate nutrition will be maintained Outcome: Progressing   Problem: Coping: Goal: Level of anxiety will decrease Outcome: Progressing   Problem: Elimination: Goal: Will not experience complications related to bowel motility Outcome: Progressing Goal: Will not experience complications related to urinary retention Outcome: Progressing   Problem: Pain Managment: Goal: General experience of comfort will improve and/or be controlled Outcome: Progressing   Problem: Safety: Goal: Ability to remain free from injury will improve Outcome: Progressing   Problem: Skin Integrity: Goal: Risk for impaired skin integrity will decrease Outcome: Progressing   Problem: Education: Goal: Ability to verbalize activity precautions or restrictions will improve Outcome: Progressing Goal: Knowledge of the prescribed therapeutic regimen will improve Outcome: Progressing Goal: Understanding of discharge needs will improve Outcome: Progressing   Problem: Activity: Goal: Ability to avoid complications of mobility impairment will  improve Outcome: Progressing Goal: Ability to tolerate increased activity will improve Outcome: Progressing Goal: Will remain free from falls Outcome: Progressing   Problem: Bowel/Gastric: Goal: Gastrointestinal status for postoperative course will improve Outcome: Progressing   Problem: Clinical Measurements: Goal: Ability to maintain clinical measurements within normal limits will improve Outcome: Progressing Goal: Postoperative complications will be avoided or minimized Outcome: Progressing Goal: Diagnostic test results will improve Outcome: Progressing   Problem: Pain Management: Goal: Pain level will decrease Outcome: Progressing   Problem: Skin Integrity: Goal: Will show signs of wound healing Outcome: Progressing   Problem: Health Behavior/Discharge Planning: Goal: Identification of resources available to assist in meeting health care needs will improve Outcome: Progressing   Problem: Bladder/Genitourinary: Goal: Urinary functional status for postoperative course will improve Outcome: Progressing   Problem: Education: Goal: Ability to describe self-care measures that may prevent or decrease complications (Diabetes Survival Skills Education) will improve Outcome: Progressing Goal: Individualized Educational Video(s) Outcome: Progressing   Problem: Coping: Goal: Ability to adjust to condition or change in health will improve Outcome: Progressing   Problem: Fluid Volume: Goal: Ability to maintain a balanced intake and output will improve Outcome: Progressing   Problem: Health Behavior/Discharge Planning: Goal: Ability to identify and utilize available resources and services will improve Outcome: Progressing Goal: Ability to manage health-related needs will improve Outcome: Progressing   Problem: Metabolic: Goal: Ability to maintain appropriate glucose levels will improve Outcome: Progressing   Problem: Nutritional: Goal: Maintenance of adequate  nutrition will improve Outcome: Progressing Goal: Progress toward achieving an optimal weight will improve Outcome: Progressing   Problem: Skin Integrity: Goal: Risk for impaired skin integrity will decrease Outcome: Progressing   Problem: Tissue Perfusion: Goal: Adequacy of tissue perfusion will improve Outcome: Progressing

## 2023-11-18 NOTE — Progress Notes (Addendum)
 Physical Therapy Treatment Patient Details Name: Robert Ramos MRN: [id number] DOB: March 17, 1951 Today's Date: 11/18/2023   History of Present Illness Robert Ramos is a 73 yo male who underwent surgical decompression at L1-3 with posterolateral arthrodesis and pedicle screw fixation 11/15/23. 11/17/23- higher O2 needs and dx with acute hypoxic respiratory failure. PMH includes: OA, Gout, HTN, Hyperlipidemia, CAD, previous lumbar surgery, CKD, tremor    PT Comments  Pt not feeling well today. Is now requiring 6L O2. Min assist bed mobility, min assist sit to stand, and mod assist SPT with RW. Unable to progress gait this session due to fatigue and weakness. Pt tremulous with stance. SpO2 89-91%. Pt understands the importance of continued mobility. Encouraged IS. Updated d/c rec to HHPT.     If plan is discharge home, recommend the following: A little help with walking and/or transfers;Assistance with cooking/housework;Assist for transportation;Help with stairs or ramp for entrance   Can travel by private vehicle        Equipment Recommendations  Rolling walker (2 wheels)    Recommendations for Other Services       Precautions / Restrictions Precautions Precautions: Back;Fall Recall of Precautions/Restrictions: Intact Precaution/Restrictions Comments: reviewed 3/3 back precautions Required Braces or Orthoses: Spinal Brace Spinal Brace: Lumbar corset;Applied in sitting position     Mobility  Bed Mobility Overal bed mobility: Needs Assistance Bed Mobility: Rolling, Sidelying to Sit Rolling: Used rails, Supervision Sidelying to sit: HOB elevated, Used rails, Min assist       General bed mobility comments: increased time, assist to elevate trunk    Transfers Overall transfer level: Needs assistance Equipment used: Rolling walker (2 wheels) Transfers: Sit to/from Stand, Bed to chair/wheelchair/BSC Sit to Stand: Min assist   Step pivot transfers: Mod assist        General transfer comment: cues for sequencing, assist to maintain balance and manage RW. Pt tremulous with stance.    Ambulation/Gait               General Gait Details: unable due to fatigue and weakness   Stairs             Wheelchair Mobility     Tilt Bed    Modified Rankin (Stroke Patients Only)       Balance Overall balance assessment: Needs assistance Sitting-balance support: Feet supported, Bilateral upper extremity supported Sitting balance-Leahy Scale: Fair Sitting balance - Comments: increased time to stabilize balance EOB Postural control: Posterior lean Standing balance support: Bilateral upper extremity supported, Reliant on assistive device for balance, During functional activity Standing balance-Leahy Scale: Poor                              Communication Communication Communication: No apparent difficulties  Cognition Arousal: Alert Behavior During Therapy: Flat affect   PT - Cognitive impairments: No apparent impairments                         Following commands: Intact      Cueing Cueing Techniques: Verbal cues, Tactile cues, Gestural cues  Exercises      General Comments General comments (skin integrity, edema, etc.): Pt on 6L continuous O2. SpO2 89-91%. Encouraged use of IS      Pertinent Vitals/Pain Pain Assessment Pain Assessment: 0-10 Pain Score: 5  Pain Location: back Pain Descriptors / Indicators: Sore Pain Intervention(s): Premedicated before session, Repositioned    Home Living  Prior Function            PT Goals (current goals can now be found in the care plan section) Acute Rehab PT Goals Patient Stated Goal: home Progress towards PT goals: Not progressing toward goals - comment (higher O2 needs, dx with acute hypoxic resp failure)    Frequency    Min 5X/week      PT Plan      Co-evaluation              AM-PAC PT 6 Clicks  Mobility   Outcome Measure  Help needed turning from your back to your side while in a flat bed without using bedrails?: A Little Help needed moving from lying on your back to sitting on the side of a flat bed without using bedrails?: A Little Help needed moving to and from a bed to a chair (including a wheelchair)?: A Lot Help needed standing up from a chair using your arms (e.g., wheelchair or bedside chair)?: A Little Help needed to walk in hospital room?: Total Help needed climbing 3-5 steps with a railing? : Total 6 Click Score: 13    End of Session Equipment Utilized During Treatment: Gait belt;Back brace;Oxygen Activity Tolerance: Patient limited by fatigue Patient left: in chair;with call bell/phone within reach;with chair alarm set Nurse Communication: Mobility status PT Visit Diagnosis: Other abnormalities of gait and mobility (R26.89);Pain     Time: 9084-9052 PT Time Calculation (min) (ACUTE ONLY): 32 min  Charges:    $Therapeutic Activity: 23-37 mins PT General Charges $$ ACUTE PT VISIT: 1 Visit                     [name], PT  Office # [phone]    Robert Ramos 11/18/2023, 12:14 PM

## 2023-11-18 NOTE — Progress Notes (Signed)
 Occupational Therapy Treatment Patient Details Name: Robert Ramos MRN: [id number] DOB: January 24, 1951 Today's Date: 11/18/2023   History of present illness Robert Ramos is a 73 yo male who underwent surgical decompression at L1-3 with posterolateral arthrodesis and pedicle screw fixation 11/15/23. 11/17/23- higher O2 needs and dx with acute hypoxic respiratory failure. PMH includes: OA, Gout, HTN, Hyperlipidemia, CAD, previous lumbar surgery, CKD, tremor   OT comments  Pt. Seen for skilled OT treatment session with wife present.  CNA also present assisting during mobility.  Pt. With notable grogginess.  Increased delay with answering questions or task initiation.  O2 hoovering mid/low 80s with difficulty gaining rebound with max cues for PLB.  MIN A for sit/stand and 2 person assist for approx. 4 steps to eob.  Assistance for doffing brace, unable to sequence for removal with out assistance.  Wife reporting he was able to yesterday.  Bed mobility for back to bed MOD A for BLES into bed. Pt. Able to assist with scooting up in bed once laying down.  Cont. With acute OT POC.        If plan is discharge home, recommend the following:  A little help with walking and/or transfers;A little help with bathing/dressing/bathroom;Assistance with cooking/housework;Assist for transportation;Help with stairs or ramp for entrance   Equipment Recommendations       Recommendations for Other Services      Precautions / Restrictions Precautions Precautions: Back;Fall Precaution/Restrictions Comments: pt. Able to name 2/3 (reminder for no twisting) Required Braces or Orthoses: Spinal Brace Spinal Brace: Lumbar corset;Applied in sitting position       Mobility Bed Mobility Overal bed mobility: Needs Assistance Bed Mobility: Sit to Sidelying, Rolling Rolling: Supervision, Used rails       Sit to sidelying: Mod assist General bed mobility comments: heavy use of rails and required assistance to  bring BLEs into bed    Transfers Overall transfer level: Needs assistance Equipment used: Rolling walker (2 wheels) Transfers: Sit to/from Stand, Bed to chair/wheelchair/BSC Sit to Stand: Min assist     Step pivot transfers: Min assist, +2 physical assistance     General transfer comment: vc for hand placement, cna assisted with steps to bed (+2 assistance required)     Balance                                           ADL either performed or assessed with clinical judgement   ADL Overall ADL's : Needs assistance/impaired                 Upper Body Dressing : Minimal assistance;Sitting;Cueing for sequencing;Cueing for compensatory techniques Upper Body Dressing Details (indicate cue type and reason): doffing back brace Lower Body Dressing: Maximal assistance Lower Body Dressing Details (indicate cue type and reason): pt. unable to access BLEs without breaking precautions. wife reports pt. has sock aide and reacher at home that he uses for LB ADLs Toilet Transfer: Minimal assistance;Rolling walker (2 wheels);+2 for physical assistance Toilet Transfer Details (indicate cue type and reason): recliner and approx. 4 steps to eob. CNA present and assisting with transfer (used urinal once back to bed)         Functional mobility during ADLs: Minimal assistance;+2 for physical assistance General ADL Comments: pt. able to recall 2/3 precautions, requiring reminder for no twisting    Extremity/Trunk Assessment  Vision       Perception     Praxis     Communication Communication Communication: No apparent difficulties   Cognition Arousal: Lethargic Behavior During Therapy: Flat affect               OT - Cognition Comments: very groggy, wife confirms, notable increase of delay from command/instruction to pt. initiation or completion                 Following commands: Intact        Cueing   Cueing Techniques: Verbal  cues  Exercises      Shoulder Instructions       General Comments      Pertinent Vitals/ Pain       Pain Assessment Pain Assessment: No/denies pain  Home Living                                          Prior Functioning/Environment              Frequency  Min 2X/week        Progress Toward Goals  OT Goals(current goals can now be found in the care plan section)  Progress towards OT goals: Progressing toward goals     Plan      Co-evaluation                 AM-PAC OT 6 Clicks Daily Activity     Outcome Measure   Help from another person eating meals?: A Little Help from another person taking care of personal grooming?: A Little Help from another person toileting, which includes using toliet, bedpan, or urinal?: A Little Help from another person bathing (including washing, rinsing, drying)?: A Little Help from another person to put on and taking off regular upper body clothing?: A Little Help from another person to put on and taking off regular lower body clothing?: A Little 6 Click Score: 18    End of Session Equipment Utilized During Treatment: Gait belt;Rolling walker (2 wheels);Back brace  OT Visit Diagnosis: Unsteadiness on feet (R26.81);Other abnormalities of gait and mobility (R26.89);Muscle weakness (generalized) (M62.81)   Activity Tolerance Patient tolerated treatment well   Patient Left in bed;with call bell/phone within reach;with bed alarm set;with family/visitor present   Nurse Communication Mobility status;Other (comment) (updated rn on session details)        Time: 8888-8867 OT Time Calculation (min): 21 min  Charges: OT General Charges $OT Visit: 1 Visit OT Treatments $Self Care/Home Management : 8-22 mins  Randall, COTA/L Acute Rehabilitation [phone]   CHRISTELLA Nest Lorraine-COTA/L  11/18/2023, 11:59 AM

## 2023-11-18 NOTE — Hospital Course (Addendum)
 73 year old male with history of anemia, arthritis, chronic bilateral low back pain/sciatica chronic pain syndrome, GERD, hypertension, hyperlipidemia, prediabetes CKD stage III, CAD s/p angioplasty stent, lumbar laminectomy/decompression 2023, OSA admitted under neurosurgery service with significant progressive spinal stenosis and lower lumbar spinal at L3-4 and L4-5 and now difficulty with ambulation underwent surgical decompression and stabilization on 11/15/2023. [facility] hospitalist consulted on 8/6 night as patient was hypoxic needing 6 L nasal cannula. He has been working with PT OT no further PT follow-up  Subjective: Patient seen and examined Overnight afebrile and BP stable oxygen requirement improving now on 3L [place] from 6l initially> subsequently this morning down to 1 L no shortness of breath is doing incentive spirometry flutter valve regularly Labs reviewed creatinine further improving 1.4 hemoglobin of 8.2 last night Eager to go home today  Assessment and plan:  Lumbar stenosis with progressive weakness: Patient underwent decompression surgery and stabilization on 11/15/23 Continue postop care pain management PT OT, as per neurosurgery  Acute hypoxic respiratory failure Multifocal: LLL and right lung base pneumonia Narrowing of the LLL with complete consolidation/possible postobstructive atelectasis: Patient hypoxic on 8/6 night prompting medicine consult and further workup w/ D-dimer positive but-CTA 8/7> showed pneumonia and other finding ( see report). But no PE.Patient has penicillin allergy, started on Levaquin  along with bronchodilators, On scheduled yupleri Brovana  Pulmicort , IS. X-ray- shows hypoinflation and possible atelectasis.  proBNP and other workup negative procalcitonin stable. VBG normal and reassuring, he does not have good effort on IS Discussed with pulmonary team continue pulmonary hygiene I-S/flutter valve OOB to chair and ambulation. Continue supplemental  oxygen-hopefully can wean off oxygen if not able to wean off please arrange oxygen on discharge Continue Levaquin  additional 5 days along with bronchodilators albuterol , I-S AF if he gets discharged home Overall medically stable  CAD-history of previous cath HLD: PTA on aspirin  and Plavix -on hold pre-operatively and resumed on 8/7 cont home statin, Ranexa , Imdur  and metoprolol .  Hypertension: BP remains well-controlled on amlodipine , metoprolol .  AKI on CKD 3a: Baseline creatinine around 1.2-1.3.  Creatinine has improved to baseline monitor renal function Recent Labs    02/23/23 0832 02/23/23 1644 11/15/23 0948 11/15/23 1549 11/16/23 1057 11/18/23 0910 11/19/23 0859 11/20/23 0537  BUN 13  --  20 23 23 16 22 21   CREATININE 1.06 1.22 1.31* 1.20 1.54* 1.32* 1.49* 1.40*  CO2 25  --  22  --  24 22 27 25   K 3.8  --  4.3 4.7 4.6 4.5 3.9 3.7   Diabetes with uncontrolled hyperglycemia: PTA on glipizide , last blood sugar 214 on 8/5.  A1c 6.9, uncontrolled hyperglycemia due to steroid-defer to primary to wean down steroid  Continue sliding scale, home glipizide > blood sugar is improving. Recent Labs  Lab 11/18/23 0910 11/18/23 1130 11/19/23 1133 11/19/23 1604 11/19/23 2125 11/20/23 0756 11/20/23 1111  GLUCAP  --    < > 193* 239* 149* 160* 166*  HGBA1C 6.9*  --   --   --   --   --   --    < > = values in this interval not displayed.    Leukocytosis: Likely multifactorial postop and also on Decadron . Resolved  Thrombocytopenia, acute on chronic: Platelet on admission 118.  Previously around low 110.monitor. Recent Labs  Lab 11/15/23 0948 11/15/23 1808 11/16/23 1057 11/18/23 0910 11/19/23 0859  PLT 118* 96* 105* 70* 83*    Normocytic anemia ABLA: Hb ~ 8 g range although 12 on admission which seems baseline.? ABLA.A anemia panel shows  stable ferritin B12 and folate although slightly low advise add iron supplement  Hemoglobin came back 6 9 on recheck 8/8 and S/P 1 u prbc  w/ iv lasix  Continue to monitor while here and needs follow-up with PCP to check CBC in 1 week this was discussed with patient and family Recent Labs    11/15/23 1808 11/16/23 1057 11/18/23 0910 11/19/23 0532 11/19/23 0859 11/19/23 1853  HGB 8.6* 8.6* 7.5*  --  6.9* 8.2*  MCV 98.1 100.0 98.3  --  98.6  --   VITAMINB12  --   --   --  737  --   --   FOLATE  --   --   --  14.6  --   --   FERRITIN  --   --   --  185  --   --   TIBC  --   --   --  308  --   --   IRON  --   --   --  20*  --   --   RETICCTPCT  --   --   --  6.1*  --   --     Tobacco use history, previous 30 yrs smoking abd 1/2 ppd: Quit for 20 yrs and started smoking 2 yrs ago and abt a ppd. Continue nicotine  patch and tobacco cessation counseling. He is advised to have outpatient PFT  Gout: Continue allopurinol   Chronic pain Arthritis: Continue gabapentin , pain management  OSA?: He was tested for osa abt 67yrs ago and never got result back.back, suspect he has osa. Need OP F/U. VBG with no co2 retention this am  Class I Obesity w/ Body mass index is 33.28 kg/m.: Will benefit with PCP follow-up, weight loss,healthy lifestyle and outpatient sleep eval if not done-again advised to have outpatient sleep apnea evaluation I have put in a ambulatory referral for sleep test  Mobility: PT Orders: Active PT Follow up Rec: Home Health Pt8/12/2023 1010.   DVT prophylaxis: enoxaparin  (LOVENOX ) injection 40 mg Start: 11/18/23 1030 SCD's Start: 11/15/23 1830 Code Status:   Code Status: Full Code Family Communication: plan of care discussed with patient/wife at bedside. Patient status is: Remains hospitalized because of severity of illness Level of care: Progressive   Dispo: The patient is from: home            Anticipated disposition:TBD Objective: Vitals last 24 hrs: Vitals:   11/19/23 2322 11/20/23 0331 11/20/23 0801 11/20/23 0859  BP: 116/68 113/75 115/68   Pulse: 73 71 76 83  Resp: (!) 22 17 17 16   Temp: 97.8 F  (36.6 C) 98.1 F (36.7 C) 97.7 F (36.5 C)   TempSrc: Oral Oral Axillary   SpO2: 95% 95% 93% 94%  Weight:      Height:        Physical Examination: General exam: AAO x 3, obese  HEENT:Oral mucosa moist, Ear/Nose WNL grossly Respiratory system: b/l clear, no use of accessory muscles no wheezing Cardiovascular system: S1 & S2 +, No JVD. Gastrointestinal system: Abdomen soft,NT,ND, BS+ Nervous System: Alert, awake, moving all extremities,and following commands. Extremities: LE edema neg,distal peripheral pulses palpable and warm.  Skin: No rashes,no icterus. MSK: Normal muscle bulk,tone, power   Medications reviewed:  Scheduled Meds:  allopurinol   100 mg Oral Daily   amLODipine   10 mg Oral Daily   arformoterol   15 mcg Nebulization BID   aspirin  EC  81 mg Oral Daily   budesonide  (PULMICORT ) nebulizer solution  0.25 mg  Nebulization BID   clopidogrel   75 mg Oral Daily   dexamethasone   1 mg Oral Q12H   docusate sodium   100 mg Oral BID   enoxaparin  (LOVENOX ) injection  40 mg Subcutaneous Daily   ferrous sulfate   325 mg Oral Q breakfast   gabapentin   300 mg Oral TID   glipiZIDE   5 mg Oral Daily   icosapent  Ethyl  2 g Oral BID   insulin  aspart  0-15 Units Subcutaneous TID WC   insulin  aspart  0-5 Units Subcutaneous QHS   isosorbide  mononitrate  60 mg Oral Daily   magnesium  hydroxide  30 mL Oral Daily   metoprolol  tartrate  75 mg Oral BID   nicotine   21 mg Transdermal Daily   ranolazine   1,000 mg Oral BID   revefenacin   175 mcg Nebulization Daily   rosuvastatin   40 mg Oral Daily   senna  1 tablet Oral BID   sodium chloride  flush  3 mL Intravenous Q12H   Continuous Infusions:  levofloxacin  (LEVAQUIN ) IV Stopped (11/19/23 1922)   Diet: Diet Order             Diet Carb Modified Fluid consistency: Thin; Room service appropriate? Yes  Diet effective now

## 2023-11-18 NOTE — Progress Notes (Signed)
 Patient ID: Robert Ramos, male   DOB: Oct 09, 1950, 73 y.o.   MRN: [id number] Patient breathing comfortably on 4 L of nasal O2.  He is not weaning his O2 and I appreciate the help of the hospitalist service in regards to managing his pulmonary toilet.  His back continues to improve steadily I encouraged him to ambulate.  Since surgery his bowels are not moved though he is passing some gas I have advised that we should use some milk of magnesia.  Continue to follow him clinically.  Not ready for discharge.

## 2023-11-18 NOTE — TOC Progression Note (Signed)
 Transition of Care (TOC) - Progression Note  Rayfield Gobble RN,BSN Inpatient Care Management Unit 4NP (Non Trauma)- RN Case Manager See Treatment Team for direct Phone #   Patient Details  Name: Robert Ramos MRN: [id number] Date of Birth: 03-19-1951  Transition of Care [facility]) CM/SW Contact  Gobble, Rayfield Hurst, RN Phone Number: 11/18/2023, 4:51 PM  Clinical Narrative:    CM follow up done with pt and wife regarding RW need-  They report that pt does have RW at home to use on discharge- will not need RW ordered.  Note pt currently on 6L today- discussed briefly with pt and wife that CM will follow for potential home 02 needs. Pt/wife voiced understanding and appreciation.   IP CM to follow    Expected Discharge Plan: Home/Self Care Barriers to Discharge: Continued Medical Work up               Expected Discharge Plan and Services   Discharge Planning Services: CM Consult Post Acute Care Choice: Durable Medical Equipment Living arrangements for the past 2 months: Single Family Home                 DME Arranged: Walker rolling         HH Arranged: NA HH Agency: NA         Social Drivers of Health (SDOH) Interventions SDOH Screenings   Food Insecurity: No Food Insecurity (11/15/2023)  Housing: Low Risk  (11/15/2023)  Transportation Needs: No Transportation Needs (11/15/2023)  Utilities: Not At Risk (11/15/2023)  Social Connections: Socially Integrated (11/15/2023)  Tobacco Use: High Risk (11/12/2023)    Readmission Risk Interventions     No data to display

## 2023-11-18 NOTE — Progress Notes (Signed)
 PROGRESS NOTE Robert Ramos  FMW:[id number] DOB: 1950-08-21 DOA: 11/15/2023 PCP: Thurmond Cathlyn LABOR., MD  Brief Narrative/Hospital Course: 73 year old male with history of anemia, arthritis, chronic bilateral low back pain/sciatica chronic pain syndrome, GERD, hypertension, hyperlipidemia, prediabetes CKD stage III, CAD s/p angioplasty stent, lumbar laminectomy/decompression 2023, OSA admitted under neurosurgery service with significant progressive spinal stenosis and lower lumbar spinal at L3-4 and L4-5 and now difficulty with ambulation underwent surgical decompression and stabilization on 11/15/2023. [facility] hospitalist consulted on 8/6 night as patient was hypoxic needing 6 L nasal cannula. He has been working with PT OT no further PT follow-up  Subjective: Seen and examined today am His wife came in as well Overnight afebrile remains on 6 L nasal cannula saturating 89-91% Labs reviewed from 11/16/23: creatinine up 1.5 previously around 1.3,  wbc 21.3, hemoglobin 8.6 platelet 105-no labs from 8/5, repeat labs including chest x-ray ordered stat He is short of breath but not in distress C/o BACK pain at incision site no chest pain , has dry cough No wheezing  Assessment and plan:  Lumbar stenosis with progressive weakness: Patient underwent decompression surgery and stabilization on 11/15/23 Continue postop care pain management PT OT, as per neurosurgery  Acute hypoxic respiratory failure: Broad differentials, no history of COPD does have a significant smoking history, no previous PFT available.  Last echo in 2023 normal EF 55 to 60%, G1 DD RVSP normal size normal and normal PASP. Patient is not resuming Plonk/as needed auscultation no fluid overload noted no acute CHF Will start work up w/ cxr,labs BNP troponin, vbg, procal  and dimer- as patient is postop and has tachycardia with significant hypoxia, also he has not been much mobile even before surgery-Postop patient had been on SCD for DVT  prophylaxis given lumbar decompression surgery- discussed w/ Dr Colon this am and ok to start lovenox  today. X-ray came back- shows hypoinflation and possible atelectasis. VBG normal and reassuring, he does not have good effort on incentive spirometry witnessed at bedside-educated on regular use. Continue on supplemental oxygen to maintain saturation at least 93%. Encourage pulmonary toileting,  IS. Mobility, consult PT, add inhaler therapy-Pulmicort  Brovana  and .  CAD HLD: History of previous CATH.  PTA on aspirin  and Plavix -on hold 3 operatively for his surgery if okay with neurosurgery primary team I will suggest resuming home antiplatelet -I discussed with Dr. Evern in the a.m. aspirin  Plavix  today.  She has Continue statin, Ranexa , Imdur  and metoprolol .  Hypertension: BP remains well-controlled on amlodipine , metoprolol   AKI on CKD 3a: Baseline creatinine around 1.2-1.3.  Noted patient had AKI on 8/5, labs ordered this morning and creatinine is improving. Recent Labs    02/23/23 0832 02/23/23 1644 11/15/23 0948 11/15/23 1549 11/16/23 1057 11/18/23 0910  BUN 13  --  20 23 23 16   CREATININE 1.06 1.22 1.31* 1.20 1.54* 1.32*  CO2 25  --  22  --  24 22  K 3.8  --  4.3 4.7 4.6 4.5   Diabetes with uncontrolled hyperglycemia: PTA on glipizide , last blood sugar 214 on 8/5. Will check A1c, add sliding scale insulin , and achs cbg check. Of note he iss on a steroid, on regular diet- will change to carb controlled diet. Recent Labs  Lab 11/18/23 0853  GLUCAP 196*    Leukocytosis: Likely multifactorial postop and also on Decadron . No fever. monitor procalcitonin normal care 6  Thrombocytopenia acute on chronic: Platelet on admission 118.  Previously around low 110. Recent Labs  Lab 11/15/23 [phone] 11/15/23  1808 11/16/23 1057 11/18/23 0910  PLT 118* 96* 105* 70*    Normocytic anemia ABLA: Hemoglobin holding around 8 g range although 12 on admission.? ABLA -repeat  hemoglobin is mid 7 g, transfuse less than 7.  Check anemia panel, fobt. Recent Labs    11/15/23 0948 11/15/23 1549 11/15/23 1808 11/16/23 1057 11/18/23 0910  HGB 12.0* 8.8* 8.6* 8.6* 7.5*  MCV 98.4  --  98.1 100.0 98.3    Tobacco use history, previous 30 yrs smoking abd 1/2 ppd: Quit for 20 yrs and started smoking 2 yrs ago and abt a ppd. Continue nicotine  patch and tobacco cessation counseling.  Gout: Continue allopurinol   Chronic pain Arthritis: Continue gabapentin , pain management  OSA?: He was tested for osa abt 25yrs ago and never got result back.back, suspect he has osa. Need OP F/U. VBG with no co2 retention this am  Class I Obesity w/ Body mass index is 33.28 kg/m.: Will benefit with PCP follow-up, weight loss,healthy lifestyle and outpatient sleep eval if not done.  Mobility: PT Orders: Active PT Follow up Rec: No Pt Follow Up8/09/2023 0825.   Discussed POC w/ attending and charge RN and pt/wife.  DVT prophylaxis: enoxaparin  (LOVENOX ) injection 40 mg Start: 11/18/23 1115 SCD's Start: 11/15/23 1830 Code Status:   Code Status: Full Code Family Communication: plan of care discussed with patient/wife at bedside. Patient status is: Remains hospitalized because of severity of illness Level of care: Med-Surg   Dispo: The patient is from: home            Anticipated disposition:TBD Objective: Vitals last 24 hrs: Vitals:   11/17/23 2259 11/18/23 0302 11/18/23 0803 11/18/23 0846  BP: 135/89 125/68 (!) 113/48 (!) 113/48  Pulse: (!) 101 97 84   Resp:  20 18   Temp: 98.4 F (36.9 C) 99.7 F (37.6 C) 98.7 F (37.1 C)   TempSrc:  Oral Oral   SpO2: (!) 88% (!) 89% 90%   Weight:      Height:        Physical Examination: General exam: alert awake, Obeseolder than stated age HEENT:Oral mucosa moist, Ear/Nose WNL grossly Respiratory system: Bilaterally clear BS,no use of accessory muscle Cardiovascular system: S1 & S2 +, obese abdomen,No JVD. Gastrointestinal  system: Abdomen soft,NT,ND, BS+ Nervous System: Alert, awake, moving all extremities,and following commands. Extremities: LE edema neg, distal extremities warm.  Skin: No rashes,no icterus. MSK: Normal muscle bulk,tone, power   Medications reviewed:  Scheduled Meds:  allopurinol   100 mg Oral Daily   amLODipine   10 mg Oral Daily   aspirin  EC  81 mg Oral Daily   clopidogrel   75 mg Oral Daily   dexamethasone   2 mg Oral Q12H   docusate sodium   100 mg Oral BID   enoxaparin  (LOVENOX ) injection  40 mg Subcutaneous Q24H   gabapentin   300 mg Oral TID   glipiZIDE   5 mg Oral Daily   icosapent  Ethyl  2 g Oral BID   insulin  aspart  0-15 Units Subcutaneous TID WC   insulin  aspart  0-5 Units Subcutaneous QHS   ipratropium-albuterol   3 mL Nebulization QID   isosorbide  mononitrate  60 mg Oral Daily   metoprolol  tartrate  75 mg Oral BID   nicotine   21 mg Transdermal Daily   ranolazine   1,000 mg Oral BID   rosuvastatin   40 mg Oral Daily   senna  1 tablet Oral BID   sodium chloride  flush  3 mL Intravenous Q12H   Continuous Infusions: Diet: Diet  Order             Diet Carb Modified Fluid consistency: Thin; Room service appropriate? Yes  Diet effective now                    Data Reviewed: I have personally reviewed following labs and imaging studies ( see epic result tab) CBC: Recent Labs  Lab 11/15/23 0948 11/15/23 1549 11/15/23 1808 11/16/23 1057 11/18/23 0910  WBC 9.4  --  10.6* 21.3* 13.7*  NEUTROABS  --   --   --  17.0*  --   HGB 12.0* 8.8* 8.6* 8.6* 7.5*  HCT 36.7* 26.0* 26.3* 26.8* 22.9*  MCV 98.4  --  98.1 100.0 98.3  PLT 118*  --  96* 105* 70*   CMP: Recent Labs  Lab 11/15/23 0948 11/15/23 1549 11/16/23 1057 11/18/23 0910  NA 137 135 132* 129*  K 4.3 4.7 4.6 4.5  CL 102 101 97* 96*  CO2 22  --  24 22  GLUCOSE 168* 212* 214* 187*  BUN 20 23 23 16   CREATININE 1.31* 1.20 1.54* 1.32*  CALCIUM  9.6  --  8.5* 9.0   GFR: Estimated Creatinine Clearance: 52.4 mL/min  (A) (by C-G formula based on SCr of 1.32 mg/dL (H)). Recent Labs  Lab 11/18/23 0910  AST 51*  ALT 26  ALKPHOS 37*  BILITOT 1.0  PROT 6.7  ALBUMIN  3.1*   No results for input(s): LIPASE, AMYLASE in the last 168 hours. No results for input(s): AMMONIA in the last 168 hours. Coagulation Profile: No results for input(s): INR, PROTIME in the last 168 hours. Unresulted Labs (From admission, onward)     Start     Ordered   11/25/23 0500  Creatinine, serum  (enoxaparin  (LOVENOX )    CrCl >/= 30 ml/min)  Weekly,   R     Comments: while on enoxaparin  therapy   Question:  Specimen collection method  Answer:  Lab=Lab collect   11/18/23 1021   11/19/23 0500  Vitamin B12  (Anemia Panel (PNL))  Tomorrow morning,   R       Question:  Specimen collection method  Answer:  Lab=Lab collect   11/18/23 1025   11/19/23 0500  Folate  (Anemia Panel (PNL))  Tomorrow morning,   R       Question:  Specimen collection method  Answer:  Lab=Lab collect   11/18/23 1025   11/19/23 0500  Iron and TIBC  (Anemia Panel (PNL))  Tomorrow morning,   R       Question:  Specimen collection method  Answer:  Lab=Lab collect   11/18/23 1025   11/19/23 0500  Ferritin  (Anemia Panel (PNL))  Tomorrow morning,   R       Question:  Specimen collection method  Answer:  Lab=Lab collect   11/18/23 1025   11/19/23 0500  Reticulocytes  (Anemia Panel (PNL))  Tomorrow morning,   R       Question:  Specimen collection method  Answer:  Lab=Lab collect   11/18/23 1025   11/18/23 1026  Occult blood card to lab, stool  Once,   R        11/18/23 1025   11/18/23 1016  D-dimer, quantitative  Add-on,   AD        11/18/23 1015   11/18/23 0829  Hemoglobin A1c  Once,   R       Comments: To assess prior glycemic control   Question:  Specimen  collection method  Answer:  Lab=Lab collect   11/18/23 0829   11/18/23 9177  Procalcitonin  ONCE - URGENT,   URGENT       References:    Procalcitonin Lower Respiratory Tract Infection AND  Sepsis Procalcitonin Algorithm  Question:  Specimen collection method  Answer:  Lab=Lab collect   11/18/23 0821          Antimicrobials/Microbiology: Anti-infectives (From admission, onward)    Start     Dose/Rate Route Frequency Ordered Stop   11/16/23 0000  vancomycin  (VANCOCIN ) IVPB 1000 mg/200 mL premix  Status:  Discontinued        1,000 mg 200 mL/hr over 60 Minutes Intravenous  Once 11/15/23 1829 11/15/23 2215   11/16/23 0000  vancomycin  (VANCOCIN ) 1,000 mg in sodium chloride  0.9 % 250 mL IVPB        1,000 mg 250 mL/hr over 60 Minutes Intravenous  Once 11/15/23 2215 11/16/23 0100   11/15/23 1200  vancomycin  (VANCOCIN ) IVPB 1000 mg/200 mL premix  Status:  Discontinued        1,000 mg 200 mL/hr over 60 Minutes Intravenous  Once 11/15/23 1152 11/15/23 1159   11/15/23 1145  vancomycin  (VANCOCIN ) IVPB 1000 mg/200 mL premix        1,000 mg 200 mL/hr over 60 Minutes Intravenous On call to O.R. 11/15/23 1140 11/15/23 1254   11/15/23 1127  ceFAZolin  (ANCEF ) 2-4 GM/100ML-% IVPB  Status:  Discontinued       Note to Pharmacy: Gleason, Ginger E: cabinet override      11/15/23 1127 11/15/23 1159     No results found for: SDES, SPECREQUEST, CULT, REPTSTATUS  Procedures: Procedure(s) (LRB): POSTERIOR LUMBAR INTERBODY FUSION LUMBAR ONE-TWO LUMBAR TWO-THREE (N/A) Mennie LAMY, MD [facility] 11/18/2023, 10:29 AM

## 2023-11-19 DIAGNOSIS — J9691 Respiratory failure, unspecified with hypoxia: Secondary | ICD-10-CM

## 2023-11-19 DIAGNOSIS — M48062 Spinal stenosis, lumbar region with neurogenic claudication: Secondary | ICD-10-CM | POA: Diagnosis not present

## 2023-11-19 LAB — BASIC METABOLIC PANEL WITH GFR
Anion gap: 11 (ref 5–15)
BUN: 22 mg/dL (ref 8–23)
CO2: 27 mmol/L (ref 22–32)
Calcium: 8.9 mg/dL (ref 8.9–10.3)
Chloride: 94 mmol/L — ABNORMAL LOW (ref 98–111)
Creatinine, Ser: 1.49 mg/dL — ABNORMAL HIGH (ref 0.61–1.24)
GFR, Estimated: 50 mL/min — ABNORMAL LOW (ref >60)
Glucose, Bld: 215 mg/dL — ABNORMAL HIGH (ref 70–99)
Potassium: 3.9 mmol/L (ref 3.5–5.1)
Sodium: 132 mmol/L — ABNORMAL LOW (ref 135–145)

## 2023-11-19 LAB — RETICULOCYTES
Immature Retic Fract: 33.7 % — ABNORMAL HIGH (ref 2.3–15.9)
RBC.: 2.23 MIL/uL — ABNORMAL LOW (ref 4.22–5.81)
Retic Count, Absolute: 135.8 K/uL (ref 19.0–186.0)
Retic Ct Pct: 6.1 % — ABNORMAL HIGH (ref 0.4–3.1)

## 2023-11-19 LAB — CBC
HCT: 21.4 % — ABNORMAL LOW (ref 39.0–52.0)
Hemoglobin: 6.9 g/dL — CL (ref 13.0–17.0)
MCH: 31.8 pg (ref 26.0–34.0)
MCHC: 32.2 g/dL (ref 30.0–36.0)
MCV: 98.6 fL (ref 80.0–100.0)
Platelets: 83 K/uL — ABNORMAL LOW (ref 150–400)
RBC: 2.17 MIL/uL — ABNORMAL LOW (ref 4.22–5.81)
RDW: 15.2 % (ref 11.5–15.5)
WBC: 14.3 K/uL — ABNORMAL HIGH (ref 4.0–10.5)
nRBC: 0.2 % (ref 0.0–0.2)

## 2023-11-19 LAB — PREPARE RBC (CROSSMATCH)

## 2023-11-19 LAB — HEMOGLOBIN AND HEMATOCRIT, BLOOD
HCT: 24.3 % — ABNORMAL LOW (ref 39.0–52.0)
Hemoglobin: 8.2 g/dL — ABNORMAL LOW (ref 13.0–17.0)

## 2023-11-19 LAB — IRON AND TIBC
Iron: 20 ug/dL — ABNORMAL LOW (ref 45–182)
Saturation Ratios: 7 % — ABNORMAL LOW (ref 17.9–39.5)
TIBC: 308 ug/dL (ref 250–450)
UIBC: 288 ug/dL

## 2023-11-19 LAB — LEGIONELLA PNEUMOPHILA SEROGP 1 UR AG: L. pneumophila Serogp 1 Ur Ag: NEGATIVE

## 2023-11-19 LAB — VITAMIN B12: Vitamin B-12: 737 pg/mL (ref 180–914)

## 2023-11-19 LAB — GLUCOSE, CAPILLARY
Glucose-Capillary: 239 mg/dL — ABNORMAL HIGH (ref 70–99)
Glucose-Capillary: 193 mg/dL — ABNORMAL HIGH (ref 70–99)
Glucose-Capillary: 239 mg/dL — ABNORMAL HIGH (ref 70–99)
Glucose-Capillary: 149 mg/dL — ABNORMAL HIGH (ref 70–99)

## 2023-11-19 LAB — FERRITIN: Ferritin: 185 ng/mL (ref 24–336)

## 2023-11-19 LAB — HEMOGLOBIN A1C
Hgb A1c MFr Bld: 6.9 % — ABNORMAL HIGH (ref 4.8–5.6)
Mean Plasma Glucose: 151 mg/dL

## 2023-11-19 LAB — FOLATE: Folate: 14.6 ng/mL (ref >5.9)

## 2023-11-19 MED ORDER — MELATONIN 5 MG PO TABS
5.0000 mg | ORAL_TABLET | Freq: Every evening | ORAL | Status: DC | PRN
  Administered 2023-11-19: 5 mg via ORAL
  Filled 2023-11-19 (×3): qty 1

## 2023-11-19 MED ORDER — DEXAMETHASONE 0.5 MG PO TABS
1.0000 mg | ORAL_TABLET | Freq: Two times a day (BID) | ORAL | Status: DC
  Administered 2023-11-19 – 2023-11-20 (×3): 1 mg via ORAL
  Filled 2023-11-19 (×4): qty 2

## 2023-11-19 MED ORDER — FERROUS SULFATE 325 (65 FE) MG PO TABS
325.0000 mg | ORAL_TABLET | Freq: Every day | ORAL | Status: DC
  Administered 2023-11-20: 325 mg via ORAL
  Filled 2023-11-19: qty 1

## 2023-11-19 MED ORDER — SODIUM CHLORIDE 0.9% IV SOLUTION: Freq: Once | INTRAVENOUS | Status: AC

## 2023-11-19 MED ORDER — FUROSEMIDE 10 MG/ML IJ SOLN
20.0000 mg | Freq: Once | INTRAMUSCULAR | Status: AC
  Administered 2023-11-19: 20 mg via INTRAVENOUS
  Filled 2023-11-19: qty 2

## 2023-11-19 NOTE — Progress Notes (Signed)
 Physical Therapy Treatment Patient Details Name: Robert Ramos MRN: [id number] DOB: 1951/02/26 Today's Date: 11/19/2023   History of Present Illness Robert Ramos is a 73 yo male who underwent surgical decompression at L1-3 with posterolateral arthrodesis and pedicle screw fixation 11/15/23. 11/17/23- higher O2 needs and dx with acute hypoxic respiratory failure. PMH includes: OA, Gout, HTN, Hyperlipidemia, CAD, previous lumbar surgery, CKD, tremor    PT Comments  Pt tolerates treatment well, ambulating for household distances. Pt demonstrates continued improvement in transfer quality, without physical assistance requirements today. Pt will benefit from frequent mobilization as well as incentive spirometer use to improve pulmonary function. PT will continue to follow in an effort to improve endurance.    If plan is discharge home, recommend the following: A little help with walking and/or transfers;Assistance with cooking/housework;Assist for transportation;Help with stairs or ramp for entrance   Can travel by private vehicle        Equipment Recommendations  Rolling walker (2 wheels)    Recommendations for Other Services       Precautions / Restrictions Precautions Precautions: Back;Fall Precaution Booklet Issued: Yes (comment) Recall of Precautions/Restrictions: Intact Required Braces or Orthoses: Spinal Brace Spinal Brace: Lumbar corset;Applied in sitting position Restrictions Weight Bearing Restrictions Per Provider Order: No     Mobility  Bed Mobility               General bed mobility comments: received sitting at edge of bed    Transfers Overall transfer level: Needs assistance Equipment used: Rolling walker (2 wheels) Transfers: Sit to/from Stand Sit to Stand: Contact guard assist           General transfer comment: verbal cues to increase anterior weight shift during sit to stand    Ambulation/Gait Ambulation/Gait assistance: Contact guard  assist Gait Distance (Feet): 80 Feet (additional trial of 15') Assistive device: Rolling walker (2 wheels) Gait Pattern/deviations: Step-to pattern, Trunk flexed Gait velocity: reduced Gait velocity interpretation: <1.8 ft/sec, indicate of risk for recurrent falls   General Gait Details: slowed step-to gait, mild increase in trunk flexion which increases with fatigue   Stairs             Wheelchair Mobility     Tilt Bed    Modified Rankin (Stroke Patients Only)       Balance Overall balance assessment: Needs assistance Sitting-balance support: No upper extremity supported, Feet supported Sitting balance-Leahy Scale: Fair     Standing balance support: Bilateral upper extremity supported, Reliant on assistive device for balance Standing balance-Leahy Scale: Poor                              Communication Communication Communication: No apparent difficulties  Cognition Arousal: Alert Behavior During Therapy: WFL for tasks assessed/performed   PT - Cognitive impairments: No apparent impairments                         Following commands: Intact      Cueing Cueing Techniques: Verbal cues  Exercises      General Comments General comments (skin integrity, edema, etc.): pt on 6L [place], sats at 88-89% at rest, poor waveform during ambulation however once seated in recliner pt's sats read 90-92%.      Pertinent Vitals/Pain Pain Assessment Pain Assessment: No/denies pain    Home Living  Prior Function            PT Goals (current goals can now be found in the care plan section) Acute Rehab PT Goals Patient Stated Goal: home Progress towards PT goals: Progressing toward goals    Frequency    Min 5X/week      PT Plan      Co-evaluation              AM-PAC PT 6 Clicks Mobility   Outcome Measure  Help needed turning from your back to your side while in a flat bed without using  bedrails?: A Little Help needed moving from lying on your back to sitting on the side of a flat bed without using bedrails?: A Little Help needed moving to and from a bed to a chair (including a wheelchair)?: A Little Help needed standing up from a chair using your arms (e.g., wheelchair or bedside chair)?: A Little Help needed to walk in hospital room?: A Little Help needed climbing 3-5 steps with a railing? : Total 6 Click Score: 16    End of Session Equipment Utilized During Treatment: Gait belt;Back brace;Oxygen Activity Tolerance: Patient tolerated treatment well Patient left: with call bell/phone within reach;with family/visitor present;with nursing/sitter in room;Other (comment) (pt left on commode, spouse and NT in room) Nurse Communication: Mobility status PT Visit Diagnosis: Other abnormalities of gait and mobility (R26.89);Pain     Time: 8994-8967 PT Time Calculation (min) (ACUTE ONLY): 27 min  Charges:    $Gait Training: 8-22 mins $Therapeutic Activity: 8-22 mins PT General Charges $$ ACUTE PT VISIT: 1 Visit                     Bernardino JINNY Ruth, PT, DPT Acute Rehabilitation Office [phone]    Bernardino JINNY Ruth 11/19/2023, 10:40 AM

## 2023-11-19 NOTE — Plan of Care (Signed)
  Problem: Education: Goal: Knowledge of General Education information will improve Description: Including pain rating scale, medication(s)/side effects and non-pharmacologic comfort measures Outcome: Progressing   Problem: Activity: Goal: Risk for activity intolerance will decrease Outcome: Progressing   Problem: Nutrition: Goal: Adequate nutrition will be maintained Outcome: Progressing   Problem: Coping: Goal: Level of anxiety will decrease Outcome: Progressing   Problem: Elimination: Goal: Will not experience complications related to bowel motility Outcome: Progressing Goal: Will not experience complications related to urinary retention Outcome: Progressing   Problem: Pain Managment: Goal: General experience of comfort will improve and/or be controlled Outcome: Progressing

## 2023-11-19 NOTE — Progress Notes (Addendum)
 Patient ID: Robert Ramos, male   DOB: 1950-12-10, 73 y.o.   MRN: [id number] Vital signs appear stable however patient is on 4 L of nasal O2.  Respiratory effort seems to be improving with sats being maintained at that level however patient's blood sugars have been increasing to 230 and most recently 250.  Patient would like to be discharged home however we will likely need supplemental oxygen if this is the case.  I would defer to hospitalist regarding appropriateness for discharge today.  I will decrease his Decadron  to 1 mg twice daily and wean him off of it from there.  Patient has sliding scale insulin  written.  Appreciate help of hospitalist service.

## 2023-11-19 NOTE — Consult Note (Addendum)
 NAME:  Robert Ramos, MRN:  [id number], DOB:  June 20, 1950, LOS: 3 ADMISSION DATE:  11/15/2023, CONSULTATION DATE:  11/19/23 REFERRING MD:  Dr Victory , CHIEF COMPLAINT:  Hypoxia    History of Present Illness:  73 year old male with a past medical history of low back pain, CKD stage III, CAD, tobacco use (about 22-pack-year smoking history) who presented to the hospital for lumbar spinal stenosis and underwent surgical decompression on 11/15/2023.  Postprocedure patient was noted to be difficult to wean off oxygen and slightly hypoxic.  Pulmonary was consulted for further evaluation.  His laboratory workup is significant for mild leukocytosis with a white count of 14.3 today.  He was also noted to be slightly anemic with a hemoglobin of 6.9. Reviewed and independently interpreted his CT angiogram of the chest that was performed on 11/18/2023 which shows evidence of a left lower lobe consolidation as well as scattered groundglass opacities consistent with a pneumonia.  He also has a small left pleural effusion that does not appear to be large enough for thoracentesis.  Pertinent  Medical History  As mentioned above  Significant Hospital Events: Including procedures, antibiotic start and stop dates in addition to other pertinent events   Surgical decompression 11/15/2023 Levofloxacin  started on 11/18/2023  Interim History / Subjective:  Patient has no complaints at this time.  Continues on 4 L nasal cannula  Objective    Blood pressure 110/75, pulse 75, temperature 98 F (36.7 C), temperature source Oral, resp. rate 18, height 5' 5 (1.651 m), weight 90.7 kg, SpO2 90%.        Intake/Output Summary (Last 24 hours) at 11/19/2023 1718 Last data filed at 11/19/2023 1637 Gross per 24 hour  Intake 666.25 ml  Output 550 ml  Net 116.25 ml   Filed Weights   11/11/23 1651 11/15/23 0843  Weight: 90.7 kg 90.7 kg    Examination: General: Elderly male not in acute distress Lungs: Clear to auscultation  bilaterally Cardiovascular: Normal S1-S2, no MRG Abdomen: Soft, nontender, nondistended Extremities: Warm, no edema   Resolved problem list   Assessment and Plan  73 year old male who presented to the hospital due to lumbar spinal stenosis and underwent decompression.  He has had ongoing hypoxia with evidence of pneumonia on CT imaging.  Laboratory workup with a supportive white count with a left shift.  He has been started on levofloxacin .  Beta-lactam's were avoided due to penicillin allergy.  -Agree with antibiotic coverage for pneumonia - Pulmonary hygiene.  Patient advised to use incentive spirometer and flutter valve - Out of bed to chair as tolerated and PT evaluation   Best Practice (right click and Reselect all SmartList Selections daily)   Per primary   Labs   CBC: Recent Labs  Lab 11/15/23 0948 11/15/23 1549 11/15/23 1808 11/16/23 1057 11/18/23 0910 11/19/23 0859  WBC 9.4  --  10.6* 21.3* 13.7* 14.3*  NEUTROABS  --   --   --  17.0*  --   --   HGB 12.0* 8.8* 8.6* 8.6* 7.5* 6.9*  HCT 36.7* 26.0* 26.3* 26.8* 22.9* 21.4*  MCV 98.4  --  98.1 100.0 98.3 98.6  PLT 118*  --  96* 105* 70* 83*    Basic Metabolic Panel: Recent Labs  Lab 11/15/23 0948 11/15/23 1549 11/16/23 1057 11/18/23 0910 11/19/23 0859  NA 137 135 132* 129* 132*  K 4.3 4.7 4.6 4.5 3.9  CL 102 101 97* 96* 94*  CO2 22  --  24 22 27  GLUCOSE 168* 212* 214* 187* 215*  BUN 20 23 23 16 22   CREATININE 1.31* 1.20 1.54* 1.32* 1.49*  CALCIUM  9.6  --  8.5* 9.0 8.9   GFR: Estimated Creatinine Clearance: 46.4 mL/min (A) (by C-G formula based on SCr of 1.49 mg/dL (H)). Recent Labs  Lab 11/15/23 1808 11/16/23 1057 11/18/23 0910 11/19/23 0859  PROCALCITON  --   --  0.40  --   WBC 10.6* 21.3* 13.7* 14.3*    Liver Function Tests: Recent Labs  Lab 11/18/23 0910  AST 51*  ALT 26  ALKPHOS 37*  BILITOT 1.0  PROT 6.7  ALBUMIN  3.1*   No results for input(s): LIPASE, AMYLASE in the last  168 hours. No results for input(s): AMMONIA in the last 168 hours.  ABG    Component Value Date/Time   HCO3 27.8 11/18/2023 0909   TCO2 23 11/15/2023 1549   O2SAT 55.5 11/18/2023 0909     Coagulation Profile: No results for input(s): INR, PROTIME in the last 168 hours.  Cardiac Enzymes: No results for input(s): CKTOTAL, CKMB, CKMBINDEX, TROPONINI in the last 168 hours.  HbA1C: Hgb A1c MFr Bld  Date/Time Value Ref Range Status  11/18/2023 09:10 AM 6.9 (H) 4.8 - 5.6 % Final    Comment:    (NOTE)         Prediabetes: 5.7 - 6.4         Diabetes: >6.4         Glycemic control for adults with diabetes: <7.0   09/08/2017 04:18 PM 6.1 (H) 4.8 - 5.6 % Final    Comment:    (NOTE) Pre diabetes:          5.7%-6.4% Diabetes:              >6.4% Glycemic control for   <7.0% adults with diabetes     CBG: Recent Labs  Lab 11/18/23 1615 11/18/23 2135 11/19/23 0738 11/19/23 1133 11/19/23 1604  GLUCAP 243* 159* 239* 193* 239*    Review of Systems:   As mentioned above   Past Medical History:  He,  has a past medical history of Anemia (09/28/2019), Arthritis, ASHD (arteriosclerotic heart disease) (08/27/2015), B12 deficiency (05/21/2022), Body mass index (BMI) 34.0-34.9, adult (03/15/2019), Chronic bilateral low back pain with bilateral sciatica (01/22/2020), Chronic pain of left knee (09/28/2019), Chronic pain syndrome (01/22/2020), Chronic radicular low back pain, Coronary artery disease, Disc displacement, 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), Dyslipidemia (11/17/2016), Dyspnea, Essential (primary) hypertension (03/15/2019), Essential hypertension (08/27/2015), Facial lesion (11/29/2020), GERD (gastroesophageal reflux disease), Gout, Heart attack (HCC) (2002), History of bacterial pneumonia (07/22/2020), History of kidney stones, Hyperlipidemia, Hypertension, 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), Kidney stone (08/27/2015), Long-term use of high-risk medication (08/27/2015), Lumbago with  sciatica, right side (01/22/2020), Lumbar foraminal stenosis (12/06/2020), Lumbar post-laminectomy syndrome (06/08/2019), Lumbar stenosis with neurogenic claudication (11/11/2021), Malaise and fatigue (08/27/2015), Mixed hyperlipidemia (08/27/2015), Non-seasonal allergic rhinitis (07/04/2018), Obstructive sleep apnea (11/17/2016), Pneumonia, Precordial chest pain (11/24/2017), Prediabetes (08/27/2015), Radiculopathy, lumbar region (03/15/2019), Shortness of breath (11/17/2016), Spinal stenosis, lumbar region with neurogenic claudication (11/04/2016), Stage 3 chronic kidney disease (HCC) (08/27/2015), and Tremor (05/30/2020).   Surgical History:   Past Surgical History:  Procedure Laterality Date   ANTERIOR LAT LUMBAR FUSION N/A 02/23/2023   Procedure: extreme Lateral Interbody Fusion - Lumbar two-Lumbar three - Interbody Fusion;  Surgeon: Colon Shove, MD;  Location: [facility] OR;  Service: Neurosurgery;  Laterality: N/A;   APPENDECTOMY  1974   BACK SURGERY     CORONARY ANGIOPLASTY WITH STENT PLACEMENT  2002;  11/20/2016   @ [facility]; @ [facility]   CORONARY CTO INTERVENTION N/A 03/02/2018   Procedure: CORONARY CTO INTERVENTION - Right;  Surgeon: Swaziland, Peter M, MD;  Location: [facility] IN[facility];  Service: Cardiovascular;  Laterality: N/A;   CORONARY PRESSURE/FFR STUDY N/A 09/16/2017   Procedure: INTRAVASCULAR PRESSURE WIRE/FFR STUDY;  Surgeon: Claudene Victory ORN, MD;  Location: [facility] INVASIVE CV LAB;  Service: Cardiovascular;  Laterality: N/A;   CORONARY STENT INTERVENTION N/A 11/20/2016   Procedure: CORONARY STENT INTERVENTION;  Surgeon: Mady Bruckner, MD;  Location: [facility] INVASIVE CV LAB;  Service: Cardiovascular;  Laterality: N/A;   CORONARY STENT INTERVENTION N/A 09/09/2017   Procedure: CORONARY STENT INTERVENTION;  Surgeon: Verlin Bruckner BIRCH, MD;  Location: [facility] INVASIVE CV LAB;  Service: Cardiovascular;  Laterality: N/A;   CORONARY ULTRASOUND/IVUS N/A 11/20/2016   Procedure: Intravascular  Ultrasound/IVUS;  Surgeon: Mady Bruckner, MD;  Location: [facility] INVASIVE CV LAB;  Service: Cardiovascular;  Laterality: N/A;   HEMI-MICRODISCECTOMY LUMBAR LAMINECTOMY LEVEL 1 Left 2008   L4   KNEE ARTHROSCOPY Left 1983   LEFT HEART CATH AND CORONARY ANGIOGRAPHY N/A 11/20/2016   Procedure: LEFT HEART CATH AND CORONARY ANGIOGRAPHY;  Surgeon: Mady Bruckner, MD;  Location: [facility] INVASIVE CV LAB;  Service: Cardiovascular;  Laterality: N/A;   LEFT HEART CATH AND CORONARY ANGIOGRAPHY N/A 09/09/2017   Procedure: LEFT HEART CATH AND CORONARY ANGIOGRAPHY;  Surgeon: Verlin Bruckner BIRCH, MD;  Location: [facility] INVASIVE CV LAB;  Service: Cardiovascular;  Laterality: N/A;   LEFT HEART CATH AND CORONARY ANGIOGRAPHY N/A 09/16/2017   Procedure: LEFT HEART CATH AND CORONARY ANGIOGRAPHY;  Surgeon: Claudene Victory ORN, MD;  Location: [facility] INVASIVE CV LAB;  Service: Cardiovascular;  Laterality: N/A;   LEFT HEART CATH AND CORONARY ANGIOGRAPHY N/A 02/21/2018   Procedure: LEFT HEART CATH AND CORONARY ANGIOGRAPHY;  Surgeon: Wonda Sharper, MD;  Location: [facility] INVASIVE CV LAB;  Service: Cardiovascular;  Laterality: N/A;   LUMBAR FUSION  12/11/2020   L1-2-3-4   LUMBAR LAMINECTOMY/DECOMPRESSION MICRODISCECTOMY N/A 11/11/2021   Procedure: LUMBAR THREE-FOUR SUBLAMINAR DECOMPRESSION;  Surgeon: Colon Victory, MD;  Location: [facility] OR;  Service: Neurosurgery;  Laterality: N/A;   TONSILLECTOMY     ULTRASOUND GUIDANCE FOR VASCULAR ACCESS  03/02/2018   Procedure: Ultrasound Guidance For Vascular Access;  Surgeon: Swaziland, Peter M, MD;  Location: [facility] INVASIVE CV LAB;  Service: Cardiovascular;;     Social History:   reports that he has been smoking cigarettes. He started smoking about 57 years ago. He has a 35 pack-year smoking history. He has never used smokeless tobacco. He reports current alcohol use of about 4.0 standard drinks of alcohol per week. He reports that he does not use drugs.   Family History:  His family history includes Cancer in  his father; Diabetes in his mother; Suicidality in his brother.   Allergies Allergies  Allergen Reactions   Penicillins Anaphylaxis, Swelling and Rash   Hydrocodone Nausea Only    Sick on stomach all day long    Oxycodone  Nausea Only    Sick on stomach all day long    Zolpidem Other (See Comments)    dizziness   Januvia [Sitagliptin] Nausea And Vomiting and Rash    Full body rash     Home Medications  Prior to Admission medications   Medication Sig Start Date End Date Taking? Authorizing Provider  allopurinol  (ZYLOPRIM ) 100 MG tablet Take 100 mg by mouth daily. 01/20/18  Yes [provider]  amLODipine  (NORVASC ) 10 MG tablet TAKE ONE TABLET  BY MOUTH ONCE DAILY 09/28/23  Yes Krasowski, Robert J, MD  aspirin  EC 81 MG tablet Take 1 tablet (81 mg total) by mouth daily. 11/17/16  Yes Krasowski, Robert J, MD  clopidogrel  (PLAVIX ) 75 MG tablet TAKE ONE TABLET BY MOUTH ONCE DAILY WITH BREAKFAST 07/12/23  Yes Krasowski, Robert J, MD  cyanocobalamin (,VITAMIN B-12,) 1000 MCG/ML injection Inject 1,000 mcg into the muscle every 30 (thirty) days. 07/31/21  Yes [provider]  gabapentin  (NEURONTIN ) 300 MG capsule Take 300 mg by mouth 3 (three) times daily. 11/05/23  Yes [provider]  glipiZIDE  (GLUCOTROL  XL) 5 MG 24 hr tablet Take 5 mg by mouth daily. 08/30/23  Yes [provider]  isosorbide  mononitrate (IMDUR ) 60 MG 24 hr tablet Take 1 tablet (60 mg total) by mouth daily. 01/25/23  Yes Krasowski, Robert J, MD  methocarbamol  (ROBAXIN ) 500 MG tablet Take 1 tablet (500 mg total) by mouth every 6 (six) hours as needed for muscle spasms. 02/24/23  Yes Colon Shove, MD  metoprolol  tartrate (LOPRESSOR ) 50 MG tablet TAKE 1 AND 1/2 TABLETS BY MOUTH TWICE DAILY 01/25/23  Yes Krasowski, Robert J, MD  naproxen (NAPROSYN) 500 MG tablet Take 500 mg by mouth 2 (two) times daily as needed for mild pain (pain score 1-3) or moderate pain (pain score 4-6). 10/07/23  Yes [provider]  nitroGLYCERIN  (NITROSTAT ) 0.4 MG SL tablet DISSOLVE 1 TABLET UNDER THE TONGUE EVERY 5 MINUTES AS NEEDED FOR CHEST PAIN. DO NOT EXCEED A TOTAL OF 3 DOSES IN 15 MINUTES. Patient taking differently: Place 0.4 mg under the tongue every 5 (five) minutes as needed for chest pain. 09/02/22  Yes Krasowski, Robert J, MD  ranolazine  (RANEXA ) 1000 MG SR tablet Take 1 tablet (1,000 mg total) by mouth 2 (two) times daily. 09/28/23  Yes Krasowski, Robert J, MD  rosuvastatin  (CRESTOR ) 40 MG tablet TAKE ONE TABLET BY MOUTH ONCE DAILY 06/28/23  Yes Krasowski, Robert J, MD  tiZANidine  (ZANAFLEX ) 4 MG capsule Take 4 mg by mouth 3 (three) times daily as needed for muscle spasms.   Yes [provider]  VASCEPA  1 g capsule Take 2 capsules (2 g total) by mouth 2 times daily at 12 noon and 4 pm. 10/08/20  Yes Hilty, Vinie BROCKS, MD  Vitamin D , Ergocalciferol , (DRISDOL ) 50000 units CAPS capsule Take 50,000 Units by mouth every Sunday.  08/02/17  Yes [provider]      I spent 61 minutes caring for this patient today, including preparing to see the patient, obtaining a medical history , reviewing a separately obtained history, performing a medically appropriate examination and/or evaluation, counseling and educating the patient/family/caregiver, ordering medications, tests, or procedures, referring and communicating with other health care professionals (not separately reported), documenting clinical information in the electronic health record, independently interpreting results (not separately reported/billed) and communicating results to the patient/family/caregiver, and care coordination (not separately reported/billed)

## 2023-11-19 NOTE — Progress Notes (Addendum)
 PROGRESS NOTE Robert Ramos  FMW:[id number] DOB: 07-17-1950 DOA: 11/15/2023 PCP: Thurmond Cathlyn LABOR., MD  Brief Narrative/Hospital Course: 73 year old male with history of anemia, arthritis, chronic bilateral low back pain/sciatica chronic pain syndrome, GERD, hypertension, hyperlipidemia, prediabetes CKD stage III, CAD s/p angioplasty stent, lumbar laminectomy/decompression 2023, OSA admitted under neurosurgery service with significant progressive spinal stenosis and lower lumbar spinal at L3-4 and L4-5 and now difficulty with ambulation underwent surgical decompression and stabilization on 11/15/2023. [facility] consulted on 8/6 night as patient was hypoxic needing 6 L nasal cannula. He has been working with PT OT no further PT follow-up  Subjective: Patient seen and examined Overnight afebrile BP stable still on supplemental oxygen at 4 L just bumped to 6 L as he was hypoxic and 86% He is showing better effort on incentive spirometry this morning and doing more often Wife is at the bedside His labs are pending this morning  Assessment and plan:  Lumbar stenosis with progressive weakness: Patient underwent decompression surgery and stabilization on 11/15/23 Continue postop care pain management PT OT, as per neurosurgery  Acute hypoxic respiratory failure Multifocal: LLL and right lung base pneumonia with narrowing of the LLL with complete consolidation/possible postobstructive atelectasis versus pneumonia: Patient hypoxic on 8/6 night prompting medicine consult and further workup D-dimer positive but-CTA 8/7> narrowing of the LLL with complete consolidation of the LLL W/ air bronchogram/ volume loss-postobstructive atelectasis versus pneumonia additional b/l GGO consistent with pneumonia, subpleural consolidation at the Rt Lung base, small left pleural effusion. 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 But no PE. Patient has penicillin allergy and now on Levaquin , bronchodilators, On  scheduled yupleri Brovana  Pulmicort , IS. X-ray- shows hypoinflation and possible atelectasis.  proBNP and other workup negative procalcitonin stable. VBG normal and reassuring, he does not have good effort on IS Continue on supplemental oxygen to maintain saturation at least 93%. I discussed with pulmonary team PCCM to see if they have any more recommendations. Encourage pulmonary toileting,  IS. Mobility  CAD-history of previous cath HLD: PTA on aspirin  and Plavix -on hold pre-operatively and resumed on 8/7 after discussion with primary team  Continue statin, Ranexa , Imdur  and metoprolol .  Hypertension: BP remains well-controlled on amlodipine , metoprolol .  AKI on CKD 3a: Baseline creatinine around 1.2-1.3.  Creatinine has improved to baseline monitor renal function Recent Labs    02/23/23 0832 02/23/23 1644 11/15/23 0948 11/15/23 1549 11/16/23 1057 11/18/23 0910 11/19/23 0859  BUN 13  --  20 23 23 16 22   CREATININE 1.06 1.22 1.31* 1.20 1.54* 1.32* 1.49*  CO2 25  --  22  --  24 22 27   K 3.8  --  4.3 4.7 4.6 4.5 3.9   Diabetes with uncontrolled hyperglycemia: PTA on glipizide , last blood sugar 214 on 8/5.  A1c 6.9, blood sugar trending up likely from steroid-defer to primary to wean down steroid continue sliding scale resume home glipizide . Recent Labs  Lab 11/18/23 0853 11/18/23 0910 11/18/23 1130 11/18/23 1615 11/18/23 2135 11/19/23 0738  GLUCAP 196*  --  221* 243* 159* 239*  HGBA1C  --  6.9*  --   --   --   --     Leukocytosis: Likely multifactorial postop and also on Decadron . Resolved  Thrombocytopenia, acute on chronic: Platelet on admission 118.  Previously around low 110.monitor. Recent Labs  Lab 11/15/23 0948 11/15/23 1808 11/16/23 1057 11/18/23 0910 11/19/23 0859  PLT 118* 96* 105* 70* 83*    Normocytic anemia ABLA: Hb ~ 8 g range although 12 on admission  which seems baseline.? ABLA -repeat hb today anemia panel shows stable ferritin B12 and folate  although slightly low iron add iron supplement  Hemoglobin came back 6 9 on recheck -patient has consented for  1 u PRBC transfusion w/ iv laisx - primary team has been notified as well  Recent Labs    11/15/23 1549 11/15/23 1808 11/16/23 1057 11/18/23 0910 11/19/23 0532 11/19/23 0859  HGB 8.8* 8.6* 8.6* 7.5*  --  6.9*  MCV  --  98.1 100.0 98.3  --  98.6  VITAMINB12  --   --   --   --  737  --   FOLATE  --   --   --   --  14.6  --   FERRITIN  --   --   --   --  185  --   TIBC  --   --   --   --  308  --   IRON  --   --   --   --  20*  --   RETICCTPCT  --   --   --   --  6.1*  --     Tobacco use history, previous 30 yrs smoking abd 1/2 ppd: Quit for 20 yrs and started smoking 2 yrs ago and abt a ppd. Continue nicotine  patch and tobacco cessation counseling.  Gout: Continue allopurinol   Chronic pain Arthritis: Continue gabapentin , pain management  OSA?: He was tested for osa abt 34yrs ago and never got result back.back, suspect he has osa. Need OP F/U. VBG with no co2 retention this am  Class I Obesity w/ Body mass index is 33.28 kg/m.: Will benefit with PCP follow-up, weight loss,healthy lifestyle and outpatient sleep eval if not done.  Mobility: PT Orders: Active PT Follow up Rec: Home Health Pt8/11/2023 1032.   Discussed POC w/ attending and charge RN and pt/wife.  DVT prophylaxis: enoxaparin  (LOVENOX ) injection 40 mg Start: 11/18/23 1030 SCD's Start: 11/15/23 1830 Code Status:   Code Status: Full Code Family Communication: plan of care discussed with patient/wife at bedside. Patient status is: Remains hospitalized because of severity of illness Level of care: Progressive   Dispo: The patient is from: home            Anticipated disposition:TBD Objective: Vitals last 24 hrs: Vitals:   11/19/23 0322 11/19/23 0735 11/19/23 0942 11/19/23 1011  BP: 131/72 105/68    Pulse: 89 72 75 78  Resp: 18 18  17   Temp: 98.1 F (36.7 C) 98.1 F (36.7 C)    TempSrc: Oral  Oral    SpO2: 93% 90%  91%  Weight:      Height:        Physical Examination: General exam: alert awake, oriented at baseline, older than stated age HEENT:Oral mucosa moist, Ear/Nose WNL grossly Respiratory system: Bilaterally diminished BS w/ crackles in the right base Cardiovascular system: S1 & S2 +, No JVD. Gastrointestinal system: Abdomen soft,NT,ND, BS+ Nervous System: Alert, awake, moving all extremities,and following commands. Extremities: LE edema neg,distal peripheral pulses palpable and warm.  Skin: No rashes,no icterus. MSK: Normal muscle bulk,tone, power   Medications reviewed:  Scheduled Meds:  sodium chloride    Intravenous Once   allopurinol   100 mg Oral Daily   amLODipine   10 mg Oral Daily   arformoterol   15 mcg Nebulization BID   aspirin  EC  81 mg Oral Daily   budesonide  (PULMICORT ) nebulizer solution  0.25 mg Nebulization BID   clopidogrel   75  mg Oral Daily   dexamethasone   1 mg Oral Q12H   docusate sodium   100 mg Oral BID   enoxaparin  (LOVENOX ) injection  40 mg Subcutaneous Daily   [START ON 11/20/2023] ferrous sulfate   325 mg Oral Q breakfast   furosemide   20 mg Intravenous Once   gabapentin   300 mg Oral TID   glipiZIDE   5 mg Oral Daily   icosapent  Ethyl  2 g Oral BID   insulin  aspart  0-15 Units Subcutaneous TID WC   insulin  aspart  0-5 Units Subcutaneous QHS   isosorbide  mononitrate  60 mg Oral Daily   magnesium  hydroxide  30 mL Oral Daily   metoprolol  tartrate  75 mg Oral BID   nicotine   21 mg Transdermal Daily   ranolazine   1,000 mg Oral BID   revefenacin   175 mcg Nebulization Daily   rosuvastatin   40 mg Oral Daily   senna  1 tablet Oral BID   sodium chloride  flush  3 mL Intravenous Q12H   Continuous Infusions:  levofloxacin  (LEVAQUIN ) IV 750 mg (11/18/23 1803)   Diet: Diet Order             Diet Carb Modified Fluid consistency: Thin; Room service appropriate? Yes  Diet effective now                    Data Reviewed: I have personally  reviewed following labs and imaging studies ( see epic result tab) CBC: Recent Labs  Lab 11/15/23 0948 11/15/23 1549 11/15/23 1808 11/16/23 1057 11/18/23 0910 11/19/23 0859  WBC 9.4  --  10.6* 21.3* 13.7* 14.3*  NEUTROABS  --   --   --  17.0*  --   --   HGB 12.0* 8.8* 8.6* 8.6* 7.5* 6.9*  HCT 36.7* 26.0* 26.3* 26.8* 22.9* 21.4*  MCV 98.4  --  98.1 100.0 98.3 98.6  PLT 118*  --  96* 105* 70* 83*   CMP: Recent Labs  Lab 11/15/23 0948 11/15/23 1549 11/16/23 1057 11/18/23 0910 11/19/23 0859  NA 137 135 132* 129* 132*  K 4.3 4.7 4.6 4.5 3.9  CL 102 101 97* 96* 94*  CO2 22  --  24 22 27   GLUCOSE 168* 212* 214* 187* 215*  BUN 20 23 23 16 22   CREATININE 1.31* 1.20 1.54* 1.32* 1.49*  CALCIUM  9.6  --  8.5* 9.0 8.9   GFR: Estimated Creatinine Clearance: 46.4 mL/min (A) (by C-G formula based on SCr of 1.49 mg/dL (H)). Recent Labs  Lab 11/18/23 0910  AST 51*  ALT 26  ALKPHOS 37*  BILITOT 1.0  PROT 6.7  ALBUMIN  3.1*   No results for input(s): LIPASE, AMYLASE in the last 168 hours. No results for input(s): AMMONIA in the last 168 hours. Coagulation Profile: No results for input(s): INR, PROTIME in the last 168 hours. Unresulted Labs (From admission, onward)     Start     Ordered   11/25/23 0500  Creatinine, serum  (enoxaparin  (LOVENOX )    CrCl >/= 30 ml/min)  Weekly,   R     Comments: while on enoxaparin  therapy   Question:  Specimen collection method  Answer:  Lab=Lab collect   11/18/23 1021   11/20/23 0500  Basic metabolic panel with GFR  Daily,   R     Question:  Specimen collection method  Answer:  Lab=Lab collect   11/19/23 0629   11/20/23 0500  CBC  Daily,   R     Question:  Specimen collection method  Answer:  Lab=Lab collect   11/19/23 0629   11/19/23 1059  Prepare RBC (crossmatch)  (Blood Administration Adult)  Once,   R       Question Answer Comment  # of Units 1 unit   Transfusion Indications Hemoglobin < 7 gm/dL and symptomatic   Number of Units  to Keep Ahead NO units ahead   If emergent release call blood bank Not emergent release      11/19/23 1058   11/18/23 1612  Legionella Pneumophila Serogp 1 Ur Ag  Once,   R        11/18/23 1614   11/18/23 1026  Occult blood card to lab, stool  Once,   R        11/18/23 1025          Antimicrobials/Microbiology: Anti-infectives (From admission, onward)    Start     Dose/Rate Route Frequency Ordered Stop   11/18/23 1700  levofloxacin  (LEVAQUIN ) IVPB 750 mg        750 mg 100 mL/hr over 90 Minutes Intravenous Every 24 hours 11/18/23 1614 11/23/23 1659   11/16/23 0000  vancomycin  (VANCOCIN ) IVPB 1000 mg/200 mL premix  Status:  Discontinued        1,000 mg 200 mL/hr over 60 Minutes Intravenous  Once 11/15/23 1829 11/15/23 2215   11/16/23 0000  vancomycin  (VANCOCIN ) 1,000 mg in sodium chloride  0.9 % 250 mL IVPB        1,000 mg 250 mL/hr over 60 Minutes Intravenous  Once 11/15/23 2215 11/16/23 0100   11/15/23 1200  vancomycin  (VANCOCIN ) IVPB 1000 mg/200 mL premix  Status:  Discontinued        1,000 mg 200 mL/hr over 60 Minutes Intravenous  Once 11/15/23 1152 11/15/23 1159   11/15/23 1145  vancomycin  (VANCOCIN ) IVPB 1000 mg/200 mL premix        1,000 mg 200 mL/hr over 60 Minutes Intravenous On call to O.R. 11/15/23 1140 11/15/23 1254   11/15/23 1127  ceFAZolin  (ANCEF ) 2-4 GM/100ML-% IVPB  Status:  Discontinued       Note to Pharmacy: Gleason, Ginger E: cabinet override      11/15/23 1127 11/15/23 1159         Component Value Date/Time   SDES EXPECTORATED SPUTUM 11/18/2023 1612   SPECREQUEST NONE 11/18/2023 1612   REPTSTATUS 11/18/2023 FINAL 11/18/2023 1612    Procedures: Procedure(s) (LRB): POSTERIOR LUMBAR INTERBODY FUSION LUMBAR ONE-TWO LUMBAR TWO-THREE (N/A) Mennie LAMY, MD [facility] 11/19/2023, 11:00 AM

## 2023-11-20 ENCOUNTER — Other Ambulatory Visit (HOSPITAL_COMMUNITY): Payer: Self-pay

## 2023-11-20 DIAGNOSIS — M48062 Spinal stenosis, lumbar region with neurogenic claudication: Secondary | ICD-10-CM | POA: Diagnosis not present

## 2023-11-20 DIAGNOSIS — J9691 Respiratory failure, unspecified with hypoxia: Secondary | ICD-10-CM | POA: Diagnosis not present

## 2023-11-20 LAB — BASIC METABOLIC PANEL WITH GFR
Anion gap: 10 (ref 5–15)
BUN: 21 mg/dL (ref 8–23)
CO2: 25 mmol/L (ref 22–32)
Calcium: 9 mg/dL (ref 8.9–10.3)
Chloride: 97 mmol/L — ABNORMAL LOW (ref 98–111)
Creatinine, Ser: 1.4 mg/dL — ABNORMAL HIGH (ref 0.61–1.24)
GFR, Estimated: 53 mL/min — ABNORMAL LOW (ref >60)
Glucose, Bld: 152 mg/dL — ABNORMAL HIGH (ref 70–99)
Potassium: 3.7 mmol/L (ref 3.5–5.1)
Sodium: 132 mmol/L — ABNORMAL LOW (ref 135–145)

## 2023-11-20 LAB — BPAM RBC
Blood Product Expiration Date: 202509032359
ISSUE DATE / TIME: 202508081350
Unit Type and Rh: 5100

## 2023-11-20 LAB — CBC
HCT: 28.1 % — ABNORMAL LOW (ref 39.0–52.0)
Hemoglobin: 9.4 g/dL — ABNORMAL LOW (ref 13.0–17.0)
MCH: 31.6 pg (ref 26.0–34.0)
MCHC: 33.5 g/dL (ref 30.0–36.0)
MCV: 94.6 fL (ref 80.0–100.0)
Platelets: 108 K/uL — ABNORMAL LOW (ref 150–400)
RBC: 2.97 MIL/uL — ABNORMAL LOW (ref 4.22–5.81)
RDW: 16.5 % — ABNORMAL HIGH (ref 11.5–15.5)
WBC: 14.4 K/uL — ABNORMAL HIGH (ref 4.0–10.5)
nRBC: 0.3 % — ABNORMAL HIGH (ref 0.0–0.2)

## 2023-11-20 LAB — GLUCOSE, CAPILLARY
Glucose-Capillary: 160 mg/dL — ABNORMAL HIGH (ref 70–99)
Glucose-Capillary: 166 mg/dL — ABNORMAL HIGH (ref 70–99)

## 2023-11-20 LAB — TYPE AND SCREEN
ABO/RH(D): O POS
Antibody Screen: NEGATIVE
Unit division: 0

## 2023-11-20 MED ORDER — ALBUTEROL SULFATE (2.5 MG/3ML) 0.083% IN NEBU: 2.5000 mg | INHALATION_SOLUTION | RESPIRATORY_TRACT | 12 refills | Status: DC | PRN

## 2023-11-20 MED ORDER — HYDROMORPHONE HCL 2 MG PO TABS: 2.0000 mg | ORAL_TABLET | Freq: Four times a day (QID) | ORAL | 0 refills | Status: DC | PRN

## 2023-11-20 MED ORDER — ALBUTEROL SULFATE (2.5 MG/3ML) 0.083% IN NEBU
2.5000 mg | INHALATION_SOLUTION | RESPIRATORY_TRACT | 12 refills | Status: DC | PRN
  Filled 2023-11-20: qty 90, 5d supply, fill #0

## 2023-11-20 MED ORDER — DEXAMETHASONE 0.5 MG PO TABS: ORAL_TABLET | ORAL | 0 refills | Status: DC

## 2023-11-20 MED ORDER — HYDROMORPHONE HCL 2 MG PO TABS
2.0000 mg | ORAL_TABLET | Freq: Four times a day (QID) | ORAL | 0 refills | Status: AC | PRN
  Filled 2023-11-20: qty 30, 8d supply, fill #0

## 2023-11-20 MED ORDER — LEVOFLOXACIN 750 MG PO TABS
750.0000 mg | ORAL_TABLET | Freq: Every day | ORAL | 0 refills | Status: AC
  Filled 2023-11-20: qty 5, 5d supply, fill #0

## 2023-11-20 MED ORDER — DEXAMETHASONE 1 MG PO TABS
ORAL_TABLET | ORAL | 0 refills | Status: AC
  Filled 2023-11-20: qty 6, 4d supply, fill #0

## 2023-11-20 MED ORDER — LEVOFLOXACIN 750 MG PO TABS: 750.0000 mg | ORAL_TABLET | Freq: Every day | ORAL | 0 refills | Status: DC

## 2023-11-20 NOTE — Progress Notes (Signed)
 Patient verbalized understanding of instructions, states that he received a message from dr Ihor office for a follow up appointment on 8/27

## 2023-11-20 NOTE — Progress Notes (Signed)
 Subjective: Patient reports feeling much better today, no shortness of breath  Objective: Vital signs in last 24 hours: Temp:  [97.6 F (36.4 C)-98.3 F (36.8 C)] 98.1 F (36.7 C) (08/09 1115) Pulse Rate:  [71-83] 73 (08/09 1115) Resp:  [16-24] 20 (08/09 1115) BP: (94-119)/(61-75) 112/71 (08/09 1115) SpO2:  [90 %-95 %] 92 % (08/09 1115)  Intake/Output from previous day: 08/08 0701 - 08/09 0700 In: 753.3 [I.V.:47; Blood:406.3; IV Piggyback:300] Out: 1700 [Urine:1700] Intake/Output this shift: Total I/O In: 240 [P.O.:240] Out: 200 [Urine:200]  Sitting in chair, LSO and brace, no acute distress Full strength in lower extremities On 1 L nasal cannula  Lab Results: Recent Labs    11/18/23 0910 11/19/23 0859 11/19/23 1853  WBC 13.7* 14.3*  --   HGB 7.5* 6.9* 8.2*  HCT 22.9* 21.4* 24.3*  PLT 70* 83*  --    BMET Recent Labs    11/19/23 0859 11/20/23 0537  NA 132* 132*  K 3.9 3.7  CL 94* 97*  CO2 27 25  GLUCOSE 215* 152*  BUN 22 21  CREATININE 1.49* 1.40*  CALCIUM  8.9 9.0    Studies/Results: CT Angio Chest Pulmonary Embolism (PE) W or WO Contrast Result Date: 11/18/2023 CLINICAL DATA:  Concern for pulmonary embolism. EXAM: CT ANGIOGRAPHY CHEST WITH CONTRAST TECHNIQUE: Multidetector CT imaging of the chest was performed using the standard protocol during bolus administration of intravenous contrast. Multiplanar CT image reconstructions and MIPs were obtained to evaluate the vascular anatomy. RADIATION DOSE REDUCTION: This exam was performed according to the departmental dose-optimization program which includes automated exposure control, adjustment of the mA and/or kV according to patient size and/or use of iterative reconstruction technique. CONTRAST:  75mL OMNIPAQUE  IOHEXOL  350 MG/ML SOLN COMPARISON:  Chest CT dated 07/27/2020. FINDINGS: Cardiovascular: There is no cardiomegaly or pericardial effusion. There is 3 vessel coronary vascular calcification. Mild  atherosclerotic calcification of the thoracic aorta. No aneurysmal dilatation or dissection. Evaluation of the pulmonary arteries is limited due to respiratory motion. No large or central pulmonary artery embolus identified. Mediastinum/Nodes: No obvious hilar or mediastinal adenopathy. The esophagus is grossly unremarkable. No mediastinal fluid collection. Lungs/Pleura: There is complete consolidation of the left lower lobe with air bronchogram and volume loss. There is narrowing of the left lower lobe bronchus. Bilateral clusters of ground-glass airspace opacities consistent with pneumonia. Additional subpleural consolidation at the right lung base noted. Small left pleural effusion. No pneumothorax. The central airways remain patent. 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 Upper Abdomen: No acute abnormality. Musculoskeletal: Degenerative changes of the spine. No acute osseous pathology. Review of the MIP images confirms the above findings. IMPRESSION: 1. No CT evidence of central pulmonary artery embolus. 2. Narrowing of the left lower lobe bronchus with left lower lobe postobstructive atelectasis versus pneumonia. Additional bilateral clusters of ground-glass airspace opacities consistent with pneumonia. 3. Small left pleural effusion. 4.  Aortic Atherosclerosis (ICD10-I70.0). Electronically Signed   By: Vanetta Chou M.D.   On: 11/18/2023 16:06    Assessment/Plan: 73 year old man status post L1-3 posterior fusion, with postoperative atelectasis but supplemental oxygen requirements have been decreasing - Possibly will be able to be discharged with home oxygen today or tomorrow.  Continue Lovenox  for DVT prophylaxis.  Dorn KANDICE Ned 11/20/2023, 11:24 AM

## 2023-11-20 NOTE — Progress Notes (Signed)
 NAME:  Robert Ramos, MRN:  [id number], DOB:  [birth year], LOS: 4 ADMISSION DATE:  11/15/2023, CONSULTATION DATE:  11/19/23 REFERRING MD:  Dr Victory , CHIEF COMPLAINT:  Hypoxia    History of Present Illness:  73 year old male with a past medical history of low back pain, CKD stage III, CAD, tobacco use (about 22-pack-year smoking history) who presented to the hospital for lumbar spinal stenosis and underwent surgical decompression on 11/15/2023.  Postprocedure patient was noted to be difficult to wean off oxygen and slightly hypoxic.  Pulmonary was consulted for further evaluation.  His laboratory workup is significant for mild leukocytosis with a white count of 14.3 today.  He was also noted to be slightly anemic with a hemoglobin of 6.9. Reviewed and independently interpreted his CT angiogram of the chest that was performed on 11/18/2023 which shows evidence of a left lower lobe consolidation as well as scattered groundglass opacities consistent with a pneumonia.  He also has a small left pleural effusion that does not appear to be large enough for thoracentesis.  Pertinent  Medical History  As mentioned above  Significant Hospital Events: Including procedures, antibiotic start and stop dates in addition to other pertinent events   Surgical decompression 11/15/2023 Levofloxacin  started on 11/18/2023  Interim History / Subjective:  Patient has no complaints at this time. He has been weaned down to 1 liter [place].   Objective    Blood pressure 112/71, pulse 73, temperature 98.1 F (36.7 C), temperature source Oral, resp. rate 20, height 5' 5 (1.651 m), weight 90.7 kg, SpO2 92%.        Intake/Output Summary (Last 24 hours) at 11/20/2023 1238 Last data filed at 11/20/2023 0800 Gross per 24 hour  Intake 993.28 ml  Output 1900 ml  Net -906.72 ml   Filed Weights   11/11/23 1651 11/15/23 0843  Weight: 90.7 kg 90.7 kg    Examination: General: Elderly male not in acute distress Lungs: Clear to  auscultation bilaterally, no wheezing or rhonchi heard  Cardiovascular: Normal S1-S2, no MRG Abdomen: Soft, nontender, nondistended Extremities: Warm, no edema   Resolved problem list   Assessment and Plan  72 year old male who presented to the hospital due to lumbar spinal stenosis and underwent decompression.  He has had ongoing hypoxia with evidence of pneumonia on CT imaging.  Laboratory workup with a supportive white count with a left shift.  He has been started on levofloxacin .  Beta-lactam's were avoided due to penicillin allergy.  Today he was able to wean to 1L oxygen. He denies any other symptoms.   -Agree with antibiotic coverage for pneumonia. Agree with levofloxain. Plan to complete a 5 day course. Can transition to oral levofloxacin  if needed for discharge  - Pulmonary hygiene.  Patient advised to use incentive spirometer and flutter valve - Out of bed to chair as tolerated and PT evaluation - Pulmonary will sign off at this time. Please reach out for any questions or concerns    Best Practice (right click and Reselect all SmartList Selections daily)   Per primary   Labs   CBC: Recent Labs  Lab 11/15/23 1808 11/16/23 1057 11/18/23 0910 11/19/23 0859 11/19/23 1853 11/20/23 1149  WBC 10.6* 21.3* 13.7* 14.3*  --  14.4*  NEUTROABS  --  17.0*  --   --   --   --   HGB 8.6* 8.6* 7.5* 6.9* 8.2* 9.4*  HCT 26.3* 26.8* 22.9* 21.4* 24.3* 28.1*  MCV 98.1 100.0 98.3 98.6  --  94.6  PLT 96* 105* 70* 83*  --  108*    Basic Metabolic Panel: Recent Labs  Lab 11/15/23 0948 11/15/23 1549 11/16/23 1057 11/18/23 0910 11/19/23 0859 11/20/23 0537  NA 137 135 132* 129* 132* 132*  K 4.3 4.7 4.6 4.5 3.9 3.7  CL 102 101 97* 96* 94* 97*  CO2 22  --  24 22 27 25   GLUCOSE 168* 212* 214* 187* 215* 152*  BUN 20 23 23 16 22 21   CREATININE 1.31* 1.20 1.54* 1.32* 1.49* 1.40*  CALCIUM  9.6  --  8.5* 9.0 8.9 9.0   GFR: Estimated Creatinine Clearance: 49.4 mL/min (A) (by C-G  formula based on SCr of 1.4 mg/dL (H)). Recent Labs  Lab 11/16/23 1057 11/18/23 0910 11/19/23 0859 11/20/23 1149  PROCALCITON  --  0.40  --   --   WBC 21.3* 13.7* 14.3* 14.4*    Liver Function Tests: Recent Labs  Lab 11/18/23 0910  AST 51*  ALT 26  ALKPHOS 37*  BILITOT 1.0  PROT 6.7  ALBUMIN  3.1*   No results for input(s): LIPASE, AMYLASE in the last 168 hours. No results for input(s): AMMONIA in the last 168 hours.  ABG    Component Value Date/Time   HCO3 27.8 11/18/2023 0909   TCO2 23 11/15/2023 1549   O2SAT 55.5 11/18/2023 0909     Coagulation Profile: No results for input(s): INR, PROTIME in the last 168 hours.  Cardiac Enzymes: No results for input(s): CKTOTAL, CKMB, CKMBINDEX, TROPONINI in the last 168 hours.  HbA1C: Hgb A1c MFr Bld  Date/Time Value Ref Range Status  11/18/2023 09:10 AM 6.9 (H) 4.8 - 5.6 % Final    Comment:    (NOTE)         Prediabetes: 5.7 - 6.4         Diabetes: >6.4         Glycemic control for adults with diabetes: <7.0   09/08/2017 04:18 PM 6.1 (H) 4.8 - 5.6 % Final    Comment:    (NOTE) Pre diabetes:          5.7%-6.4% Diabetes:              >6.4% Glycemic control for   <7.0% adults with diabetes     CBG: Recent Labs  Lab 11/19/23 1133 11/19/23 1604 11/19/23 2125 11/20/23 0756 11/20/23 1111  GLUCAP 193* 239* 149* 160* 166*    Review of Systems:   As mentioned above   Past Medical History:  He,  has a past medical history of Anemia (09/28/2019), Arthritis, ASHD (arteriosclerotic heart disease) (08/27/2015), B12 deficiency (05/21/2022), Body mass index (BMI) 34.0-34.9, adult (03/15/2019), Chronic bilateral low back pain with bilateral sciatica (01/22/2020), Chronic pain of left knee (09/28/2019), Chronic pain syndrome (01/22/2020), Chronic radicular low back pain, Coronary artery disease, Disc displacement, 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), Dyslipidemia (11/17/2016), Dyspnea, Essential (primary) hypertension  (03/15/2019), Essential hypertension (08/27/2015), Facial lesion (11/29/2020), GERD (gastroesophageal reflux disease), Gout, Heart attack (HCC) (2002), History of bacterial pneumonia (07/22/2020), History of kidney stones, Hyperlipidemia, Hypertension, 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), Kidney stone (08/27/2015), Long-term use of high-risk medication (08/27/2015), Lumbago with sciatica, right side (01/22/2020), Lumbar foraminal stenosis (12/06/2020), Lumbar post-laminectomy syndrome (06/08/2019), Lumbar stenosis with neurogenic claudication (11/11/2021), Malaise and fatigue (08/27/2015), Mixed hyperlipidemia (08/27/2015), Non-seasonal allergic rhinitis (07/04/2018), Obstructive sleep apnea (11/17/2016), Pneumonia, Precordial chest pain (11/24/2017), Prediabetes (08/27/2015), Radiculopathy, lumbar region (03/15/2019), Shortness of breath (11/17/2016), Spinal stenosis, lumbar region with neurogenic claudication (11/04/2016), Stage 3 chronic kidney disease (HCC) (08/27/2015), and  Tremor (05/30/2020).   Surgical History:   Past Surgical History:  Procedure Laterality Date   ANTERIOR LAT LUMBAR FUSION N/A 02/23/2023   Procedure: extreme Lateral Interbody Fusion - Lumbar two-Lumbar three - Interbody Fusion;  Surgeon: Colon Shove, MD;  Location: [facility] OR;  Service: Neurosurgery;  Laterality: N/A;   APPENDECTOMY  1974   BACK SURGERY     CORONARY ANGIOPLASTY WITH STENT PLACEMENT  2002; 11/20/2016   @ [facility]; @ [facility]   CORONARY CTO INTERVENTION N/A 03/02/2018   Procedure: CORONARY CTO INTERVENTION - Right;  Surgeon: Swaziland, Peter M, MD;  Location: [facility] INVASIVE CV LAB;  Service: Cardiovascular;  Laterality: N/A;   CORONARY PRESSURE/FFR STUDY N/A 09/16/2017   Procedure: INTRAVASCULAR PRESSURE WIRE/FFR STUDY;  Surgeon: Claudene Shove ORN, MD;  Location: [facility] INVASIVE CV LAB;  Service: Cardiovascular;  Laterality: N/A;   CORONARY STENT INTERVENTION N/A 11/20/2016   Procedure: CORONARY STENT INTERVENTION;   Surgeon: Mady Bruckner, MD;  Location: [facility] INVASIVE CV LAB;  Service: Cardiovascular;  Laterality: N/A;   CORONARY STENT INTERVENTION N/A 09/09/2017   Procedure: CORONARY STENT INTERVENTION;  Surgeon: Verlin Bruckner BIRCH, MD;  Location: [facility] INVASIVE CV LAB;  Service: Cardiovascular;  Laterality: N/A;   CORONARY ULTRASOUND/IVUS N/A 11/20/2016   Procedure: Intravascular Ultrasound/IVUS;  Surgeon: Mady Bruckner, MD;  Location: [facility] INVASIVE CV LAB;  Service: Cardiovascular;  Laterality: N/A;   HEMI-MICRODISCECTOMY LUMBAR LAMINECTOMY LEVEL 1 Left 2008   L4   KNEE ARTHROSCOPY Left 1983   LEFT HEART CATH AND CORONARY ANGIOGRAPHY N/A 11/20/2016   Procedure: LEFT HEART CATH AND CORONARY ANGIOGRAPHY;  Surgeon: Mady Bruckner, MD;  Location: [facility] INVASIVE CV LAB;  Service: Cardiovascular;  Laterality: N/A;   LEFT HEART CATH AND CORONARY ANGIOGRAPHY N/A 09/09/2017   Procedure: LEFT HEART CATH AND CORONARY ANGIOGRAPHY;  Surgeon: Verlin Bruckner BIRCH, MD;  Location: [facility] INVASIVE CV LAB;  Service: Cardiovascular;  Laterality: N/A;   LEFT HEART CATH AND CORONARY ANGIOGRAPHY N/A 09/16/2017   Procedure: LEFT HEART CATH AND CORONARY ANGIOGRAPHY;  Surgeon: Claudene Shove ORN, MD;  Location: [facility] INVASIVE CV LAB;  Service: Cardiovascular;  Laterality: N/A;   LEFT HEART CATH AND CORONARY ANGIOGRAPHY N/A 02/21/2018   Procedure: LEFT HEART CATH AND CORONARY ANGIOGRAPHY;  Surgeon: Wonda Sharper, MD;  Location[facility] INVASIVE CV LAB;  Service: Cardiovascular;  Laterality: N/A;   LUMBAR FUSION  12/11/2020   L1-2-3-4   LUMBAR LAMINECTOMY/DECOMPRESSION MICRODISCECTOMY N/A 11/11/2021   Procedure: LUMBAR THREE-FOUR SUBLAMINAR DECOMPRESSION;  Surgeon: Colon Shove, MD;  Location: [facility] OR;  Service: Neurosurgery;  Laterality: N/A;   TONSILLECTOMY     ULTRASOUND GUIDANCE FOR VASCULAR ACCESS  03/02/2018   Procedure: Ultrasound Guidance For Vascular Access;  Surgeon: Swaziland, Peter M, MD;  Location: [facility] INVASIVE CV LAB;  Service:  Cardiovascular;;     Social History:   reports that he has been smoking cigarettes. He started smoking about 57 years ago. He has a 35 pack-year smoking history. He has never used smokeless tobacco. He reports current alcohol use of about 4.0 standard drinks of alcohol per week. He reports that he does not use drugs.   Family History:  His family history includes Cancer in his father; Diabetes in his mother; Suicidality in his brother.   Allergies Allergies  Allergen Reactions   Penicillins Anaphylaxis, Swelling and Rash   Hydrocodone Nausea Only    Sick on stomach all day long    Oxycodone  Nausea Only    Sick on stomach all day long  Zolpidem Other (See Comments)    dizziness   Januvia [Sitagliptin] Nausea And Vomiting and Rash    Full body rash     Home Medications  Prior to Admission medications   Medication Sig Start Date End Date Taking? Authorizing Provider  allopurinol  (ZYLOPRIM ) 100 MG tablet Take 100 mg by mouth daily. 01/20/18  Yes [provider]  amLODipine  (NORVASC ) 10 MG tablet TAKE ONE TABLET BY MOUTH ONCE DAILY 09/28/23  Yes Krasowski, Robert J, MD  aspirin  EC 81 MG tablet Take 1 tablet (81 mg total) by mouth daily. 11/17/16  Yes Krasowski, Robert J, MD  clopidogrel  (PLAVIX ) 75 MG tablet TAKE ONE TABLET BY MOUTH ONCE DAILY WITH BREAKFAST 07/12/23  Yes Krasowski, Robert J, MD  cyanocobalamin (,VITAMIN B-12,) 1000 MCG/ML injection Inject 1,000 mcg into the muscle every 30 (thirty) days. 07/31/21  Yes [provider]  gabapentin  (NEURONTIN ) 300 MG capsule Take 300 mg by mouth 3 (three) times daily. 11/05/23  Yes [provider]  glipiZIDE  (GLUCOTROL  XL) 5 MG 24 hr tablet Take 5 mg by mouth daily. 08/30/23  Yes [provider]  isosorbide  mononitrate (IMDUR ) 60 MG 24 hr tablet Take 1 tablet (60 mg total) by mouth daily. 01/25/23  Yes Krasowski, Robert J, MD  methocarbamol  (ROBAXIN ) 500 MG tablet Take 1 tablet (500 mg total) by mouth every 6  (six) hours as needed for muscle spasms. 02/24/23  Yes Colon Shove, MD  metoprolol  tartrate (LOPRESSOR ) 50 MG tablet TAKE 1 AND 1/2 TABLETS BY MOUTH TWICE DAILY 01/25/23  Yes Krasowski, Robert J, MD  naproxen (NAPROSYN) 500 MG tablet Take 500 mg by mouth 2 (two) times daily as needed for mild pain (pain score 1-3) or moderate pain (pain score 4-6). 10/07/23  Yes [provider]  nitroGLYCERIN  (NITROSTAT ) 0.4 MG SL tablet DISSOLVE 1 TABLET UNDER THE TONGUE EVERY 5 MINUTES AS NEEDED FOR CHEST PAIN. DO NOT EXCEED A TOTAL OF 3 DOSES IN 15 MINUTES. Patient taking differently: Place 0.4 mg under the tongue every 5 (five) minutes as needed for chest pain. 09/02/22  Yes Krasowski, Robert J, MD  ranolazine  (RANEXA ) 1000 MG SR tablet Take 1 tablet (1,000 mg total) by mouth 2 (two) times daily. 09/28/23  Yes Krasowski, Robert J, MD  rosuvastatin  (CRESTOR ) 40 MG tablet TAKE ONE TABLET BY MOUTH ONCE DAILY 06/28/23  Yes Krasowski, Robert J, MD  tiZANidine  (ZANAFLEX ) 4 MG capsule Take 4 mg by mouth 3 (three) times daily as needed for muscle spasms.   Yes [provider]  VASCEPA  1 g capsule Take 2 capsules (2 g total) by mouth 2 times daily at 12 noon and 4 pm. 10/08/20  Yes Hilty, Vinie BROCKS, MD  Vitamin D , Ergocalciferol , (DRISDOL ) 50000 units CAPS capsule Take 50,000 Units by mouth every Sunday.  08/02/17  Yes [provider]      I spent 42 minutes caring for this patient today, including preparing to see the patient, obtaining a medical history , reviewing a separately obtained history, performing a medically appropriate examination and/or evaluation, counseling and educating the patient/family/caregiver, ordering medications, tests, or procedures, referring and communicating with other health care professionals (not separately reported), documenting clinical information in the electronic health record, independently interpreting results (not separately reported/billed) and communicating  results to the patient/family/caregiver, and care coordination (not separately reported/billed)

## 2023-11-20 NOTE — Progress Notes (Signed)
 SATURATION QUALIFICATIONS: (This note is used to comply with regulatory documentation for home oxygen)  Patient Saturations on Room Air at Rest = 94%  Patient Saturations on Room Air while Ambulating = 85% ;pt ambulated roughly ~33ft, pt noted to have increased SOB and dyspnea with exertion.   Patient Saturations on 2 Liters of oxygen while Ambulating = 90%

## 2023-11-20 NOTE — Progress Notes (Signed)
 Physical Therapy Treatment Patient Details Name: Robert Ramos MRN: [id number] DOB: [birth year] Today's Date: 11/20/2023   History of Present Illness Robert Ramos is a 73 yo male who underwent surgical decompression at L1-3 with posterolateral arthrodesis and pedicle screw fixation 11/15/23. 11/17/23- higher O2 needs and dx with acute hypoxic respiratory failure. PMH includes: OA, Gout, HTN, Hyperlipidemia, CAD, previous lumbar surgery, CKD, tremor    PT Comments  The pt is making good functional progress as he was able to ambulate further, up to ~260 ft without rest, and navigate x2 stairs today. He does continue to display some weakness/endurance deficits, but appear to be improving gradually. Educated pt and wife on sitting up for < 1 hour at a time, changing positions frequently, walking schedule, use of IS, assist needed at d/c, and car transfers. They verbalized understanding. Will continue to follow acutely.   If plan is discharge home, recommend the following: A little help with walking and/or transfers;Assistance with cooking/housework;Assist for transportation;Help with stairs or ramp for entrance   Can travel by private vehicle        Equipment Recommendations  Rolling walker (2 wheels)    Recommendations for Other Services       Precautions / Restrictions Precautions Precautions: Back;Fall Precaution Booklet Issued: Yes (comment) Recall of Precautions/Restrictions: Intact Precaution/Restrictions Comments: watch SpO2 Required Braces or Orthoses: Spinal Brace Spinal Brace: Lumbar corset;Applied in sitting position Restrictions Weight Bearing Restrictions Per Provider Order: No     Mobility  Bed Mobility Overal bed mobility: Needs Assistance Bed Mobility: Rolling, Sidelying to Sit Rolling: Modified independent (Device/Increase time), Used rails Sidelying to sit: Supervision, HOB elevated, Used rails       General bed mobility comments: Pt demonstrated good  compliance with his spinal precautions. Slightly increased time to ascend trunk to sit up, supervision for safety    Transfers Overall transfer level: Needs assistance Equipment used: Rolling walker (2 wheels) Transfers: Sit to/from Stand Sit to Stand: Supervision           General transfer comment: Mild instability noted when transferring to stand from EOB but no LOB, supervision for safety    Ambulation/Gait Ambulation/Gait assistance: Supervision, Contact guard assist Gait Distance (Feet): 260 Feet Assistive device: Rolling walker (2 wheels) Gait Pattern/deviations: Trunk flexed, Step-through pattern, Decreased step length - right, Decreased step length - left, Decreased stride length Gait velocity: reduced Gait velocity interpretation: <1.8 ft/sec, indicate of risk for recurrent falls   General Gait Details: slowed step-through gait with mildly flexed posture. Mild shakiness as distance and fatigue increase, but no LOB, CGA-supervision for safety   Stairs Stairs: Yes Stairs assistance: Contact guard assist Stair Management: One rail Right, Step to pattern, Forwards, One rail Left Number of Stairs: 2 General stair comments: Ascends with R rail and descends with L rail and R HHA, no LOB, CGA for safety   Wheelchair Mobility     Tilt Bed    Modified Rankin (Stroke Patients Only)       Balance Overall balance assessment: Needs assistance Sitting-balance support: No upper extremity supported, Feet supported Sitting balance-Leahy Scale: Fair     Standing balance support: Bilateral upper extremity supported, Reliant on assistive device for balance, During functional activity Standing balance-Leahy Scale: Poor Standing balance comment: reliant on RW                            Communication Communication Communication: No apparent difficulties  Cognition Arousal: Alert  Behavior During Therapy: WFL for tasks assessed/performed   PT - Cognitive  impairments: No apparent impairments                         Following commands: Intact      Cueing Cueing Techniques: Verbal cues  Exercises      General Comments General comments (skin integrity, edema, etc.): SpO2 >/= 92% on 1L when ambulating, down to 88% on 1L when transitioning to sit from supine, pleth unreliable and unreadable at times when ambulating on 2L; educated pt and wife on sitting up for < 1 hour at a time, changing positions frequently, walking schedule, use of IS, assist needed at d/c, and car transfers. They verbalized understanding.      Pertinent Vitals/Pain Pain Assessment Pain Assessment: Faces Faces Pain Scale: Hurts a little bit Pain Location: back Pain Descriptors / Indicators: Discomfort, Operative site guarding Pain Intervention(s): Monitored during session, Limited activity within patient's tolerance, Repositioned    Home Living                          Prior Function            PT Goals (current goals can now be found in the care plan section) Acute Rehab PT Goals Patient Stated Goal: home PT Goal Formulation: With patient Time For Goal Achievement: 11/30/23 Potential to Achieve Goals: Good Progress towards PT goals: Progressing toward goals    Frequency    Min 5X/week      PT Plan      Co-evaluation              AM-PAC PT 6 Clicks Mobility   Outcome Measure  Help needed turning from your back to your side while in a flat bed without using bedrails?: None Help needed moving from lying on your back to sitting on the side of a flat bed without using bedrails?: A Little Help needed moving to and from a bed to a chair (including a wheelchair)?: A Little Help needed standing up from a chair using your arms (e.g., wheelchair or bedside chair)?: A Little Help needed to walk in hospital room?: A Little Help needed climbing 3-5 steps with a railing? : A Little 6 Click Score: 19    End of Session Equipment  Utilized During Treatment: Back brace;Oxygen Activity Tolerance: Patient tolerated treatment well Patient left: with call bell/phone within reach;with family/visitor present;in chair;with nursing/sitter in room Nurse Communication: Mobility status;Other (comment) (sats) PT Visit Diagnosis: Other abnormalities of gait and mobility (R26.89);Pain;Unsteadiness on feet (R26.81)     Time: 9058-8996 PT Time Calculation (min) (ACUTE ONLY): 22 min  Charges:    $Gait Training: 8-22 mins PT General Charges $$ ACUTE PT VISIT: 1 Visit                     Theo Ferretti, PT, DPT Acute Rehabilitation Services  Office: [phone]    Theo CHRISTELLA Ferretti 11/20/2023, 11:04 AM

## 2023-11-20 NOTE — Progress Notes (Addendum)
 Pt discharged with portable oxygen tank and TOC medications. Pt wife to transport pt home. Pt transported off unit via wheelchair by Newell, RN with all personal belongings. AVS and discharge education completed by Newell, Charity fundraiser.

## 2023-11-20 NOTE — TOC Progression Note (Addendum)
 Transition of Care [facility]) - Progression Note    Patient Details  Name: Robert Ramos MRN: [id number] Date of Birth: September 15, 1950  [facility] CM/SW Contact  Robynn Eileen Hoose, RN Phone Number: 11/20/2023, 11:35 AM  Clinical Narrative:   Message from provider regarding patient needing home oxygen. Spoke with patient and wife, confirms that he has Medicare. After discussing cost of home oxygen out of pocket ($187.79/mo) vs using Medicare plan ($10/mo), patient and wife decided on using Medicare plan. Medicare number given, [id number]. Floor nurse aware of need for Ambulation sat note. Oxygen to be ordered through Jermaine with [employer], if patient qualifies.  1204: Ambulatory Sat note seen, Provider and Jermaine with [employer] made aware.    Expected Discharge Plan: Home/Self Care Barriers to Discharge: Continued Medical Work up               Expected Discharge Plan and Services   Discharge Planning Services: CM Consult Post Acute Care Choice: Durable Medical Equipment Living arrangements for the past 2 months: Single Family Home Expected Discharge Date: 11/20/23               DME Arranged: Vannie rolling         HH Arranged: NA HH Agency: NA         Social Drivers of Health (SDOH) Interventions SDOH Screenings   Food Insecurity: No Food Insecurity (11/15/2023)  Housing: Low Risk  (11/15/2023)  Transportation Needs: No Transportation Needs (11/15/2023)  Utilities: Not At Risk (11/15/2023)  Social Connections: Socially Integrated (11/15/2023)  Tobacco Use: High Risk (11/12/2023)    Readmission Risk Interventions     No data to display

## 2023-11-20 NOTE — Progress Notes (Signed)
 [id number] RN weaned patient downs from 4L [place] to 3L [place]. During this time patient has been sating between 93-97%.   0525-RN will wean patient down to 2L [place].

## 2023-11-20 NOTE — Progress Notes (Signed)
 PROGRESS NOTE Robert Ramos  FMW:[id number] DOB: 09-05-1950 DOA: 11/15/2023 PCP: Thurmond Cathlyn LABOR., MD  Brief Narrative/Hospital Course: 73 year old male with history of anemia, arthritis, chronic bilateral low back pain/sciatica chronic pain syndrome, GERD, hypertension, hyperlipidemia, prediabetes CKD stage III, CAD s/p angioplasty stent, lumbar laminectomy/decompression 2023, OSA admitted under neurosurgery service with significant progressive spinal stenosis and lower lumbar spinal at L3-4 and L4-5 and now difficulty with ambulation underwent surgical decompression and stabilization on 11/15/2023. [facility] hospitalist consulted on 8/6 night as patient was hypoxic needing 6 L nasal cannula. He has been working with PT OT no further PT follow-up  Subjective: Patient seen and examined Overnight afebrile and BP stable oxygen requirement improving now on 3L [place] from 6l initially> subsequently this morning down to 1 L no shortness of breath is doing incentive spirometry flutter valve regularly Labs reviewed creatinine further improving 1.4 hemoglobin of 8.2 last night Eager to go home today  Assessment and plan:  Lumbar stenosis with progressive weakness: Patient underwent decompression surgery and stabilization on 11/15/23 Continue postop care pain management PT OT, as per neurosurgery  Acute hypoxic respiratory failure Multifocal: LLL and right lung base pneumonia Narrowing of the LLL with complete consolidation/possible postobstructive atelectasis: Patient hypoxic on 8/6 night prompting medicine consult and further workup w/ D-dimer positive but-CTA 8/7> showed pneumonia and other finding ( see report). But no PE.Patient has penicillin allergy, started on Levaquin  along with bronchodilators, On scheduled yupleri Brovana  Pulmicort , IS. X-ray- shows hypoinflation and possible atelectasis.  proBNP and other workup negative procalcitonin stable. VBG normal and reassuring, he does not have good effort  on IS Discussed with pulmonary team continue pulmonary hygiene I-S/flutter valve OOB to chair and ambulation. Continue supplemental oxygen-hopefully can wean off oxygen if not able to wean off please arrange oxygen on discharge Continue Levaquin  additional 5 days along with bronchodilators albuterol , I-S AF if he gets discharged home Overall medically stable  CAD-history of previous cath HLD: PTA on aspirin  and Plavix -on hold pre-operatively and resumed on 8/7 cont home statin, Ranexa , Imdur  and metoprolol .  Hypertension: BP remains well-controlled on amlodipine , metoprolol .  AKI on CKD 3a: Baseline creatinine around 1.2-1.3.  Creatinine has improved to baseline monitor renal function Recent Labs    02/23/23 0832 02/23/23 1644 11/15/23 0948 11/15/23 1549 11/16/23 1057 11/18/23 0910 11/19/23 0859 11/20/23 0537  BUN 13  --  20 23 23 16 22 21   CREATININE 1.06 1.22 1.31* 1.20 1.54* 1.32* 1.49* 1.40*  CO2 25  --  22  --  24 22 27 25   K 3.8  --  4.3 4.7 4.6 4.5 3.9 3.7   Diabetes with uncontrolled hyperglycemia: PTA on glipizide , last blood sugar 214 on 8/5.  A1c 6.9, uncontrolled hyperglycemia due to steroid-defer to primary to wean down steroid  Continue sliding scale, home glipizide > blood sugar is improving. Recent Labs  Lab 11/18/23 0910 11/18/23 1130 11/19/23 1133 11/19/23 1604 11/19/23 2125 11/20/23 0756 11/20/23 1111  GLUCAP  --    < > 193* 239* 149* 160* 166*  HGBA1C 6.9*  --   --   --   --   --   --    < > = values in this interval not displayed.    Leukocytosis: Likely multifactorial postop and also on Decadron . Resolved  Thrombocytopenia, acute on chronic: Platelet on admission 118.  Previously around low 110.monitor. Recent Labs  Lab 11/15/23 0948 11/15/23 1808 11/16/23 1057 11/18/23 0910 11/19/23 0859  PLT 118* 96* 105* 70* 83*  Normocytic anemia ABLA: Hb ~ 8 g range although 12 on admission which seems baseline.? ABLA.A anemia panel shows  stable ferritin B12 and folate although slightly low advise add iron supplement  Hemoglobin came back 6 9 on recheck 8/8 and S/P 1 u prbc w/ iv lasix  Continue to monitor while here and needs follow-up with PCP to check CBC in 1 week this was discussed with patient and family Recent Labs    11/15/23 1808 11/16/23 1057 11/18/23 0910 11/19/23 0532 11/19/23 0859 11/19/23 1853  HGB 8.6* 8.6* 7.5*  --  6.9* 8.2*  MCV 98.1 100.0 98.3  --  98.6  --   VITAMINB12  --   --   --  737  --   --   FOLATE  --   --   --  14.6  --   --   FERRITIN  --   --   --  185  --   --   TIBC  --   --   --  308  --   --   IRON  --   --   --  20*  --   --   RETICCTPCT  --   --   --  6.1*  --   --     Tobacco use history, previous 30 yrs smoking abd 1/2 ppd: Quit for 20 yrs and started smoking 2 yrs ago and abt a ppd. Continue nicotine  patch and tobacco cessation counseling. He is advised to have outpatient PFT  Gout: Continue allopurinol   Chronic pain Arthritis: Continue gabapentin , pain management  OSA?: He was tested for osa abt 51yrs ago and never got result back.back, suspect he has osa. Need OP F/U. VBG with no co2 retention this am  Class I Obesity w/ Body mass index is 33.28 kg/m.: Will benefit with PCP follow-up, weight loss,healthy lifestyle and outpatient sleep eval if not done-again advised to have outpatient sleep apnea evaluation I have put in a ambulatory referral for sleep test  Mobility: PT Orders: Active PT Follow up Rec: Home Health Pt8/12/2023 1010.   DVT prophylaxis: enoxaparin  (LOVENOX ) injection 40 mg Start: 11/18/23 1030 SCD's Start: 11/15/23 1830 Code Status:   Code Status: Full Code Family Communication: plan of care discussed with patient/wife at bedside. Patient status is: Remains hospitalized because of severity of illness Level of care: Progressive   Dispo: The patient is from: home            Anticipated disposition:TBD Objective: Vitals last 24 hrs: Vitals:    11/19/23 2322 11/20/23 0331 11/20/23 0801 11/20/23 0859  BP: 116/68 113/75 115/68   Pulse: 73 71 76 83  Resp: (!) 22 17 17 16   Temp: 97.8 F (36.6 C) 98.1 F (36.7 C) 97.7 F (36.5 C)   TempSrc: Oral Oral Axillary   SpO2: 95% 95% 93% 94%  Weight:      Height:        Physical Examination: General exam: AAO x 3, obese  HEENT:Oral mucosa moist, Ear/Nose WNL grossly Respiratory system: b/l clear, no use of accessory muscles no wheezing Cardiovascular system: S1 & S2 +, No JVD. Gastrointestinal system: Abdomen soft,NT,ND, BS+ Nervous System: Alert, awake, moving all extremities,and following commands. Extremities: LE edema neg,distal peripheral pulses palpable and warm.  Skin: No rashes,no icterus. MSK: Normal muscle bulk,tone, power   Medications reviewed:  Scheduled Meds:  allopurinol   100 mg Oral Daily   amLODipine   10 mg Oral Daily   arformoterol   15 mcg Nebulization  BID   aspirin  EC  81 mg Oral Daily   budesonide  (PULMICORT ) nebulizer solution  0.25 mg Nebulization BID   clopidogrel   75 mg Oral Daily   dexamethasone   1 mg Oral Q12H   docusate sodium   100 mg Oral BID   enoxaparin  (LOVENOX ) injection  40 mg Subcutaneous Daily   ferrous sulfate   325 mg Oral Q breakfast   gabapentin   300 mg Oral TID   glipiZIDE   5 mg Oral Daily   icosapent  Ethyl  2 g Oral BID   insulin  aspart  0-15 Units Subcutaneous TID WC   insulin  aspart  0-5 Units Subcutaneous QHS   isosorbide  mononitrate  60 mg Oral Daily   magnesium  hydroxide  30 mL Oral Daily   metoprolol  tartrate  75 mg Oral BID   nicotine   21 mg Transdermal Daily   ranolazine   1,000 mg Oral BID   revefenacin   175 mcg Nebulization Daily   rosuvastatin   40 mg Oral Daily   senna  1 tablet Oral BID   sodium chloride  flush  3 mL Intravenous Q12H   Continuous Infusions:  levofloxacin  (LEVAQUIN ) IV Stopped (11/19/23 1922)   Diet: Diet Order             Diet Carb Modified Fluid consistency: Thin; Room service appropriate? Yes   Diet effective now                    Data Reviewed: I have personally reviewed following labs and imaging studies ( see epic result tab) CBC: Recent Labs  Lab 11/15/23 0948 11/15/23 1549 11/15/23 1808 11/16/23 1057 11/18/23 0910 11/19/23 0859 11/19/23 1853  WBC 9.4  --  10.6* 21.3* 13.7* 14.3*  --   NEUTROABS  --   --   --  17.0*  --   --   --   HGB 12.0*   < > 8.6* 8.6* 7.5* 6.9* 8.2*  HCT 36.7*   < > 26.3* 26.8* 22.9* 21.4* 24.3*  MCV 98.4  --  98.1 100.0 98.3 98.6  --   PLT 118*  --  96* 105* 70* 83*  --    < > = values in this interval not displayed.   CMP: Recent Labs  Lab 11/15/23 0948 11/15/23 1549 11/16/23 1057 11/18/23 0910 11/19/23 0859 11/20/23 0537  NA 137 135 132* 129* 132* 132*  K 4.3 4.7 4.6 4.5 3.9 3.7  CL 102 101 97* 96* 94* 97*  CO2 22  --  24 22 27 25   GLUCOSE 168* 212* 214* 187* 215* 152*  BUN 20 23 23 16 22 21   CREATININE 1.31* 1.20 1.54* 1.32* 1.49* 1.40*  CALCIUM  9.6  --  8.5* 9.0 8.9 9.0   GFR: Estimated Creatinine Clearance: 49.4 mL/min (A) (by C-G formula based on SCr of 1.4 mg/dL (H)). Recent Labs  Lab 11/18/23 0910  AST 51*  ALT 26  ALKPHOS 37*  BILITOT 1.0  PROT 6.7  ALBUMIN  3.1*   No results for input(s): LIPASE, AMYLASE in the last 168 hours. No results for input(s): AMMONIA in the last 168 hours. Coagulation Profile: No results for input(s): INR, PROTIME in the last 168 hours. Unresulted Labs (From admission, onward)     Start     Ordered   11/25/23 0500  Creatinine, serum  (enoxaparin  (LOVENOX )    CrCl >/= 30 ml/min)  Weekly,   R     Comments: while on enoxaparin  therapy   Question:  Specimen collection method  Answer:  Lab=Lab collect   11/18/23 1021   11/20/23 0820  CBC  Once,   STAT        11/20/23 0820   11/20/23 0808  CBC  Once,   R       Comments: Recollection needed, unable to analyze per core lab.   Question:  Specimen collection method  Answer:  Lab=Lab collect   11/20/23 0808   11/20/23 0500   Basic metabolic panel with GFR  Daily,   R     Question:  Specimen collection method  Answer:  Lab=Lab collect   11/19/23 0629   11/20/23 0500  CBC  Daily,   R     Question:  Specimen collection method  Answer:  Lab=Lab collect   11/19/23 0629   11/18/23 1026  Occult blood card to lab, stool  Once,   R        11/18/23 1025          Antimicrobials/Microbiology: Anti-infectives (From admission, onward)    Start     Dose/Rate Route Frequency Ordered Stop   11/20/23 0000  levofloxacin  (LEVAQUIN ) 750 MG tablet        750 mg Oral Daily 11/20/23 1112 11/25/23 2359   11/18/23 1700  levofloxacin  (LEVAQUIN ) IVPB 750 mg        750 mg 100 mL/hr over 90 Minutes Intravenous Every 24 hours 11/18/23 1614 11/23/23 1659   11/16/23 0000  vancomycin  (VANCOCIN ) IVPB 1000 mg/200 mL premix  Status:  Discontinued        1,000 mg 200 mL/hr over 60 Minutes Intravenous  Once 11/15/23 1829 11/15/23 2215   11/16/23 0000  vancomycin  (VANCOCIN ) 1,000 mg in sodium chloride  0.9 % 250 mL IVPB        1,000 mg 250 mL/hr over 60 Minutes Intravenous  Once 11/15/23 2215 11/16/23 0100   11/15/23 1200  vancomycin  (VANCOCIN ) IVPB 1000 mg/200 mL premix  Status:  Discontinued        1,000 mg 200 mL/hr over 60 Minutes Intravenous  Once 11/15/23 1152 11/15/23 1159   11/15/23 1145  vancomycin  (VANCOCIN ) IVPB 1000 mg/200 mL premix        1,000 mg 200 mL/hr over 60 Minutes Intravenous On call to O.R. 11/15/23 1140 11/15/23 1254   11/15/23 1127  ceFAZolin  (ANCEF ) 2-4 GM/100ML-% IVPB  Status:  Discontinued       Note to Pharmacy: Gleason, Ginger E: cabinet override      11/15/23 1127 11/15/23 1159         Component Value Date/Time   SDES EXPECTORATED SPUTUM 11/18/2023 1612   SPECREQUEST NONE 11/18/2023 1612   REPTSTATUS 11/18/2023 FINAL 11/18/2023 1612    Procedures: Procedure(s) (LRB): POSTERIOR LUMBAR INTERBODY FUSION LUMBAR ONE-TWO LUMBAR TWO-THREE (N/A) Mennie LAMY, MD [facility] 11/20/2023, 11:14 AM

## 2023-11-20 NOTE — Discharge Summary (Signed)
 Patient ID: Robert Ramos MRN: [id number] DOB/AGE: [birth year] 72 y.o.  Admit date: 11/15/2023 Discharge date: 11/20/2023  Admission Diagnoses: Spinal stenosis of lumbar region with neurogenic claudication [M48.062] S/P lumbar fusion [Z98.1] S/P lumbar spine operation [Z98.890]   Discharge Diagnoses: Same   Discharged Condition: Stable  Hospital Course:  Robert Ramos is a 73 y.o. male who was admitted following a lumbar fusion. They were recovered in PACU and transferred to the floor. Hospital course was complicated by increased need for supplemental O2 via [place], now weaned to [facility] and improved after use of breathing treatments and IS, pulmonary toilet. The hospitalist service has deemed him appropriate for discharge in this regard today, with home O2 via [place] if needed, add'l IS and albuterol , as well as additional 5d Levaquin  PO. Pt stable for discharge today from NSGY standpoint as he is mobilizing well, tolerating a diet, and pain is well controlled. Pt to f/u in office for routine post op visit. Pt is in agreement w/ plan.    Discharge Exam: Blood pressure 115/68, pulse 83, temperature 97.7 F (36.5 C), temperature source Axillary, resp. rate 16, height 5' 5 (1.651 m), weight 90.7 kg, SpO2 94%.  Disposition: Discharge disposition: 01-Home or Self Care       Discharge Instructions     Ambulatory referral to Sleep Studies   Complete by: As directed    Incentive spirometry RT   Complete by: As directed    No wound care   Complete by: As directed       Allergies as of 11/20/2023       Reactions   Penicillins Anaphylaxis, Swelling, Rash   Hydrocodone Nausea Only   Sick on stomach all day long    Oxycodone  Nausea Only   Sick on stomach all day long    Zolpidem Other (See Comments)   dizziness   Januvia [sitagliptin] Nausea And Vomiting, Rash   Full body rash        Medication List     TAKE these medications    albuterol  (2.5 MG/3ML) 0.083% nebulizer  solution Commonly known as: PROVENTIL  Take 3 mLs (2.5 mg total) by nebulization every 4 (four) hours as needed for wheezing or shortness of breath.   allopurinol  100 MG tablet Commonly known as: ZYLOPRIM  Take 100 mg by mouth daily.   amLODipine  10 MG tablet Commonly known as: NORVASC  TAKE ONE TABLET BY MOUTH ONCE DAILY   aspirin  EC 81 MG tablet Take 1 tablet (81 mg total) by mouth daily.   clopidogrel  75 MG tablet Commonly known as: PLAVIX  TAKE ONE TABLET BY MOUTH ONCE DAILY WITH BREAKFAST   cyanocobalamin 1000 MCG/ML injection Commonly known as: VITAMIN B12 Inject 1,000 mcg into the muscle every 30 (thirty) days.   dexamethasone  0.5 MG tablet Commonly known as: Decadron  Take 2 tablets (1 mg total) by mouth 2 (two) times daily for 2 days, THEN 2 tablets (1 mg total) daily for 2 days. Start taking on: November 20, 2023   gabapentin  300 MG capsule Commonly known as: NEURONTIN  Take 300 mg by mouth 3 (three) times daily.   glipiZIDE  5 MG 24 hr tablet Commonly known as: GLUCOTROL  XL Take 5 mg by mouth daily.   HYDROmorphone  2 MG tablet Commonly known as: DILAUDID  Take 1 tablet (2 mg total) by mouth every 6 (six) hours as needed for severe pain (pain score 7-10).   isosorbide  mononitrate 60 MG 24 hr tablet Commonly known as: IMDUR  Take 1 tablet (60 mg total) by  mouth daily.   levofloxacin  750 MG tablet Commonly known as: Levaquin  Take 1 tablet (750 mg total) by mouth daily for 5 days.   methocarbamol  500 MG tablet Commonly known as: ROBAXIN  Take 1 tablet (500 mg total) by mouth every 6 (six) hours as needed for muscle spasms.   metoprolol  tartrate 50 MG tablet Commonly known as: LOPRESSOR  TAKE 1 AND 1/2 TABLETS BY MOUTH TWICE DAILY   naproxen 500 MG tablet Commonly known as: NAPROSYN Take 500 mg by mouth 2 (two) times daily as needed for mild pain (pain score 1-3) or moderate pain (pain score 4-6).   nitroGLYCERIN  0.4 MG SL tablet Commonly known as:  NITROSTAT  DISSOLVE 1 TABLET UNDER THE TONGUE EVERY 5 MINUTES AS NEEDED FOR CHEST PAIN. DO NOT EXCEED A TOTAL OF 3 DOSES IN 15 MINUTES. What changed: See the new instructions.   ranolazine  1000 MG SR tablet Commonly known as: RANEXA  Take 1 tablet (1,000 mg total) by mouth 2 (two) times daily.   rosuvastatin  40 MG tablet Commonly known as: CRESTOR  TAKE ONE TABLET BY MOUTH ONCE DAILY   tiZANidine  4 MG capsule Commonly known as: ZANAFLEX  Take 4 mg by mouth 3 (three) times daily as needed for muscle spasms.   Vascepa  1 g capsule Generic drug: icosapent  Ethyl Take 2 capsules (2 g total) by mouth 2 times daily at 12 noon and 4 pm.   Vitamin D  (Ergocalciferol ) 1.25 MG (50000 UNIT) Caps capsule Commonly known as: DRISDOL  Take 50,000 Units by mouth every Sunday.               Durable Medical Equipment  (From admission, onward)           Start     Ordered   11/17/23 1244  For home use only DME Walker rolling  Once       Question Answer Comment  Walker: With 5 Inch Wheels   Patient needs a walker to treat with the following condition Spinal stenosis      08 /06/25 1243             Signed: Tashari Schoenfelder CAYLIN Jeania Nater 11/20/2023, 11:19 AM

## 2023-11-22 ENCOUNTER — Other Ambulatory Visit (HOSPITAL_COMMUNITY): Payer: Self-pay

## 2023-11-22 ENCOUNTER — Telehealth: Payer: Self-pay | Admitting: Emergency Medicine

## 2023-11-22 DIAGNOSIS — R0602 Shortness of breath: Secondary | ICD-10-CM

## 2023-11-22 NOTE — Telephone Encounter (Signed)
 Nebulizer not ordered before pt was discharged.

## 2023-11-23 DIAGNOSIS — D696 Thrombocytopenia, unspecified: Secondary | ICD-10-CM | POA: Insufficient documentation

## 2023-12-26 DIAGNOSIS — R778 Other specified abnormalities of plasma proteins: Secondary | ICD-10-CM | POA: Insufficient documentation

## 2023-12-26 DIAGNOSIS — D890 Polyclonal hypergammaglobulinemia: Secondary | ICD-10-CM | POA: Insufficient documentation

## 2024-01-10 ENCOUNTER — Other Ambulatory Visit: Payer: Self-pay | Admitting: Cardiology

## 2024-01-16 ENCOUNTER — Other Ambulatory Visit: Payer: Self-pay | Admitting: Oncology

## 2024-01-16 DIAGNOSIS — D696 Thrombocytopenia, unspecified: Secondary | ICD-10-CM

## 2024-01-16 NOTE — Progress Notes (Signed)
 [facility] at [facility] [address] [place],  [place]  [address] [phone]  Clinic Day:  01/17/2024  Referring physician: Benson Eleanor PARAS*   HISTORY OF PRESENT ILLNESS:  The patient is a 73 y.o. male  who I was asked to consult upon for thrombocytopenia.  Recent labs at his primary care office showed a mildly low platelet count of 140.  His hemoglobin was mildly low at 10.8.  Iron studies showed no evidence of iron deficiency anemia being present.  With respect to his thrombocytopenia, the patient denies having any history of hepatosplenic disease.  He also denies being placed on any new medications which have the ability to cause thrombocytopenia via bone marrow suppression.  He has had night sweats for 7 years, but denies having any fevers, weight loss, lymphadenopathy, or other findings which concern him for his thrombocytopenia being due to an underlying hematologic malignancy.  To his knowledge, there is no family history of thrombocytopenia or other hematologic disorders.  PAST MEDICAL HISTORY:   Past Medical History:  Diagnosis Date   Anemia 09/28/2019   Arthritis    hands, back (03/02/2018)   ASHD (arteriosclerotic heart disease) 08/27/2015   B12 deficiency 05/21/2022   Body mass index (BMI) 34.0-34.9, adult 03/15/2019   Chronic bilateral low back pain with bilateral sciatica 01/22/2020   Chronic pain of left knee 09/28/2019   Chronic pain syndrome 01/22/2020   Chronic radicular low back pain    Coronary artery disease    a. MI in 2002 s/p stenting to mRCA b.cath 11/2016 s/p DES to mLAD c. Cath 08/2017- patent LAD stent, 40% instent restenosis of mRCA, 99% posterolateral artery s/p PTCA & DES.   Disc displacement, lumbar 05/10/2019   Dyslipidemia 11/17/2016   Dyspnea    Essential (primary) hypertension 03/15/2019   Essential hypertension 08/27/2015   Facial lesion 11/29/2020   GERD (gastroesophageal reflux disease)    Gout    on RX qod  (03/02/2018)   Heart attack (HCC) 2002   History of bacterial pneumonia 07/22/2020   History of kidney stones    Hyperlipidemia    Hypertension    Hypogonadism in male 08/27/2015   Kidney stone 08/27/2015   Long-term use of high-risk medication 08/27/2015   Lumbago with sciatica, right side 01/22/2020   Lumbar foraminal stenosis 12/06/2020   Lumbar post-laminectomy syndrome 06/08/2019   Lumbar stenosis with neurogenic claudication 11/11/2021   Malaise and fatigue 08/27/2015   Mixed hyperlipidemia 08/27/2015   Non-seasonal allergic rhinitis 07/04/2018   Obstructive sleep apnea 11/17/2016   Pneumonia    Precordial chest pain 11/24/2017   Prediabetes 08/27/2015   Radiculopathy, lumbar region 03/15/2019   Shortness of breath 11/17/2016   Spinal stenosis, 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   Stage 3 chronic kidney disease (HCC) 08/27/2015   Patient unaware.  Creatine on 11/2021 was 1.27.   Tremor 05/30/2020    PAST SURGICAL HISTORY:   Past Surgical History:  Procedure Laterality Date   ANTERIOR LAT LUMBAR FUSION N/A 02/23/2023   Procedure: extreme Lateral Interbody Fusion - Lumbar two-Lumbar three - Interbody Fusion;  Surgeon: Colon Shove, MD;  Location: [facility] OR;  Service: Neurosurgery;  Laterality: N/A;   APPENDECTOMY  1974   BACK SURGERY     CATARACT EXTRACTION Bilateral    CORONARY ANGIOPLASTY WITH STENT PLACEMENT  2002; 11/20/2016   @ [facility]; @ [facility]   CORONARY CTO INTERVENTION N/A 03/02/2018   Procedure: CORONARY CTO INTERVENTION - Right;  Surgeon:  Jordan, Peter M, MD;  [facility] INVASIVE CV LAB;  Service: Cardiovascular;  Laterality: N/A;   CORONARY PRESSURE/FFR STUDY N/A 09/16/2017   Procedure: INTRAVASCULAR PRESSURE WIRE/FFR STUDY;  Surgeon: Claudene Victory ORN, MD;  Location: [facility] INVASIVE CV LAB;  Service: Cardiovascular;  Laterality: N/A;   CORONARY STENT INTERVENTION N/A 11/20/2016   Procedure: CORONARY STENT INTERVENTION;  Surgeon: Mady Bruckner, MD;  Location: [facility] INVASIVE CV LAB;  Service: Cardiovascular;  Laterality: N/A;   CORONARY STENT INTERVENTION N/A 09/09/2017   Procedure: CORONARY STENT INTERVENTION;  Surgeon: Verlin Bruckner BIRCH, MD;  Location: [facility] INVASIVE CV LAB;  Service: Cardiovascular;  Laterality: N/A;   CORONARY ULTRASOUND/IVUS N/A 11/20/2016   Procedure: Intravascular Ultrasound/IVUS;  Surgeon: Mady Bruckner, MD;  Location: [facility] INVASIVE CV LAB;  Service: Cardiovascular;  Laterality: N/A;   HEMI-MICRODISCECTOMY LUMBAR LAMINECTOMY LEVEL 1 Left 2008   L4   KNEE ARTHROSCOPY Left 1983   LEFT HEART CATH AND CORONARY ANGIOGRAPHY N/A 11/20/2016   Procedure: LEFT HEART CATH AND CORONARY ANGIOGRAPHY;  Surgeon: Mady Bruckner, MD;  Location: [facility] INVASIVE CV LAB;  Service: Cardiovascular;  Laterality: N/A;   LEFT HEART CATH AND CORONARY ANGIOGRAPHY N/A 09/09/2017   Procedure: LEFT HEART CATH AND CORONARY ANGIOGRAPHY;  Surgeon: Verlin Bruckner BIRCH, MD;  Location: [facility] INVASIVE CV LAB;  Service: Cardiovascular;  Laterality: N/A;   LEFT HEART CATH AND CORONARY ANGIOGRAPHY N/A 09/16/2017   Procedure: LEFT HEART CATH AND CORONARY ANGIOGRAPHY;  Surgeon: Claudene Victory ORN, MD;  Location: [facility] INVASIVE CV LAB;  Service: Cardiovascular;  Laterality: N/A;   LEFT HEART CATH AND CORONARY ANGIOGRAPHY N/A 02/21/2018   Procedure: LEFT HEART CATH AND CORONARY ANGIOGRAPHY;  Surgeon: Wonda Sharper, MD;  Location: [facility] INVASIVE CV LAB;  Service: Cardiovascular;  Laterality: N/A;   LUMBAR FUSION  12/11/2020   L1-2-3-4   LUMBAR LAMINECTOMY/DECOMPRESSION MICRODISCECTOMY N/A 11/11/2021   Procedure: LUMBAR THREE-FOUR SUBLAMINAR DECOMPRESSION;  Surgeon: Colon Victory, MD;  Location: [facility] OR;  Service: Neurosurgery;  Laterality: N/A;   TONSILLECTOMY     ULTRASOUND GUIDANCE FOR VASCULAR ACCESS  03/02/2018   Procedure: Ultrasound Guidance For Vascular Access;  Surgeon: Jordan, Peter M, MD;  Location: [facility] INVASIVE CV LAB;  Service:  Cardiovascular;;   VASECTOMY     WISDOM TOOTH EXTRACTION      CURRENT MEDICATIONS:   Current Outpatient Medications  Medication Sig Dispense Refill   albuterol  (PROVENTIL ) (2.5 MG/3ML) 0.083% nebulizer solution Take 3 mLs (2.5 mg total) by nebulization every 4 (four) hours as needed for wheezing or shortness of breath. 90 mL 12   allopurinol  (ZYLOPRIM ) 100 MG tablet Take 100 mg by mouth daily.  3   amLODipine  (NORVASC ) 10 MG tablet TAKE ONE TABLET BY MOUTH ONCE DAILY 90 tablet 0   aspirin  EC 81 MG tablet Take 1 tablet (81 mg total) by mouth daily. 90 tablet 3   clopidogrel  (PLAVIX ) 75 MG tablet TAKE ONE TABLET BY MOUTH ONCE DAILY WITH BREAKFAST 90 tablet 2   cyanocobalamin (,VITAMIN B-12,) 1000 MCG/ML injection Inject 1,000 mcg into the muscle every 30 (thirty) days.     gabapentin  (NEURONTIN ) 300 MG capsule Take 300 mg by mouth 3 (three) times daily.     glipiZIDE  (GLUCOTROL  XL) 5 MG 24 hr tablet Take 5 mg by mouth daily.     HYDROmorphone  (DILAUDID ) 2 MG tablet Take 1 tablet (2 mg total) by mouth every 6 (six) hours as needed for severe pain (pain score 7-10). 30 tablet 0   isosorbide  mononitrate (IMDUR ) 60 MG  24 hr tablet Take 1 tablet (60 mg total) by mouth daily. 90 tablet 3   methocarbamol  (ROBAXIN ) 500 MG tablet Take 1 tablet (500 mg total) by mouth every 6 (six) hours as needed for muscle spasms. 30 tablet 3   metoprolol  tartrate (LOPRESSOR ) 50 MG tablet TAKE 1 AND 1/2 TABLETS BY MOUTH TWICE DAILY 270 tablet 1   naproxen (NAPROSYN) 500 MG tablet Take 500 mg by mouth 2 (two) times daily as needed for mild pain (pain score 1-3) or moderate pain (pain score 4-6).     nitroGLYCERIN  (NITROSTAT ) 0.4 MG SL tablet DISSOLVE 1 TABLET UNDER THE TONGUE EVERY 5 MINUTES AS NEEDED FOR CHEST PAIN. DO NOT EXCEED A TOTAL OF 3 DOSES IN 15 MINUTES. (Patient taking differently: Place 0.4 mg under the tongue every 5 (five) minutes as needed for chest pain.) 25 tablet 1   ranolazine  (RANEXA ) 1000 MG SR  tablet Take 1 tablet (1,000 mg total) by mouth 2 (two) times daily. 180 tablet 1   rosuvastatin  (CRESTOR ) 40 MG tablet TAKE ONE TABLET BY MOUTH ONCE DAILY 90 tablet 0   tiZANidine  (ZANAFLEX ) 4 MG capsule Take 4 mg by mouth 3 (three) times daily as needed for muscle spasms.     VASCEPA  1 g capsule Take 2 capsules (2 g total) by mouth 2 times daily at 12 noon and 4 pm. 120 capsule 2   Vitamin D , Ergocalciferol , (DRISDOL ) 50000 units CAPS capsule Take 50,000 Units by mouth every Sunday.   3   No current facility-administered medications for this visit.    ALLERGIES:   Allergies  Allergen Reactions   Penicillins Anaphylaxis, Swelling and Rash   Hydrocodone Nausea Only    Sick on stomach all day long    Oxycodone  Nausea Only    Sick on stomach all day long    Zolpidem Other (See Comments)    dizziness   Januvia [Sitagliptin] Nausea And Vomiting and Rash    Full body rash    FAMILY HISTORY:   Family History  Problem Relation Age of Onset   Diabetes Mother    Cancer Father        UNK  PRIMARY   Other Sister        MVA   Suicidality Brother     SOCIAL HISTORY:  The patient was born and raised in [place], [place] .  He currently lives in [place] with his wife of 40 years.  They have 3 children, 7 grandchildren, and 2 great-grandchildren.  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  The patient smoked pipes for nearly 40 years.  He drinks alcohol on rare occasions.  REVIEW OF SYSTEMS:  Review of Systems  Constitutional:  Positive for fatigue. Negative for fever and unexpected weight change.  Respiratory:  Negative for chest tightness, cough, hemoptysis and shortness of breath.   Cardiovascular:  Negative for chest pain and palpitations.  Gastrointestinal:  Negative for abdominal distention, abdominal pain, blood in stool, constipation, diarrhea, nausea and vomiting.  Genitourinary:  Negative for dysuria, frequency and hematuria.   Musculoskeletal:  Positive for  arthralgias. Negative for back pain and myalgias.  Skin:  Negative for itching and rash.  Neurological:  Negative for dizziness, headaches and light-headedness.  Psychiatric/Behavioral:  Positive for depression. Negative for suicidal ideas. The patient is not nervous/anxious.      PHYSICAL EXAM:  Blood pressure (!) 148/75, pulse 79, temperature 97.8 F (36.6 C), temperature source Oral, resp. rate 16, height 5'  5 (1.651 m), weight 188 lb 3.2 oz (85.4 kg), SpO2 97%. Wt Readings from Last 3 Encounters:  01/17/24 188 lb 3.2 oz (85.4 kg)  11/15/23 200 lb (90.7 kg)  10/20/23 200 lb (90.7 kg)   Body mass index is 31.32 kg/m. Performance status (ECOG): 1 - Symptomatic but completely ambulatory Physical Exam Constitutional:      Appearance: Normal appearance. He is not ill-appearing.     Comments: A chronically ill appearing gentleman in no acute distress  HENT:     Mouth/Throat:     Mouth: Mucous membranes are moist.     Pharynx: Oropharynx is clear. No oropharyngeal exudate or posterior oropharyngeal erythema.  Cardiovascular:     Rate and Rhythm: Normal rate and regular rhythm.     Heart sounds: No murmur heard.    No friction rub. No gallop.  Pulmonary:     Effort: Pulmonary effort is normal. No respiratory distress.     Breath sounds: Normal breath sounds. No wheezing, rhonchi or rales.  Abdominal:     General: Bowel sounds are normal. There is no distension.     Palpations: Abdomen is soft. There is no mass.     Tenderness: There is no abdominal tenderness.  Musculoskeletal:        General: No swelling.     Right lower leg: No edema.     Left lower leg: No edema.  Lymphadenopathy:     Cervical: No cervical adenopathy.     Upper Body:     Right upper body: No supraclavicular or axillary adenopathy.     Left upper body: No supraclavicular or axillary adenopathy.     Lower Body: No right inguinal adenopathy. No left inguinal adenopathy.  Skin:    General: Skin is warm.      Coloration: Skin is not jaundiced.     Findings: No lesion or rash.  Neurological:     General: No focal deficit present.     Mental Status: He is alert and oriented to person, place, and time. Mental status is at baseline.  Psychiatric:        Mood and Affect: Mood normal.        Behavior: Behavior normal.        Thought Content: Thought content normal.    LABS:      Latest Ref Rng & Units 01/17/2024   10:50 AM 11/20/2023   11:49 AM 11/19/2023    6:53 PM  CBC  WBC 4.0 - 10.5 K/uL 7.4  14.4    Hemoglobin 13.0 - 17.0 g/dL 87.8  9.4  8.2   Hematocrit 39.0 - 52.0 % 37.6  28.1  24.3   Platelets 150 - 400 K/uL 97  108        Latest Ref Rng & Units 01/17/2024   10:50 AM 11/20/2023    5:37 AM 11/19/2023    8:59 AM  CMP  Glucose 70 - 99 mg/dL 832  847  784   BUN 8 - 23 mg/dL 14  21  22    Creatinine 0.61 - 1.24 mg/dL 8.84  8.59  8.50   Sodium 135 - 145 mmol/L 138  132  132   Potassium 3.5 - 5.1 mmol/L 4.1  3.7  3.9   Chloride 98 - 111 mmol/L 103  97  94   CO2 22 - 32 mmol/L 20  25  27    Calcium  8.9 - 10.3 mg/dL 9.5  9.0  8.9   Total Protein 6.5 - 8.1  g/dL 8.0     Total Bilirubin 0.0 - 1.2 mg/dL 0.5     Alkaline Phos 38 - 126 U/L 72     AST 15 - 41 U/L 17     ALT 0 - 44 U/L 18      ASSESSMENT & PLAN:  A 73 y.o. male who I was asked to consult upon for thrombocytopenia.  Of note, labs today also showed mild anemia.  For completeness, I will check his liver enzymes today to ensure no type of hepatosplenic disease factoring into his thrombocytopenia.  His B12 and folate levels will also be checked to ensure there are no nutritional deficiencies factoring into his thrombocytopenia.  As he is mildly anemic, I will check a serum protein electrophoresis to ensure an underlying plasma cell dyscrasia is not present.  I made sure the patient understood that, although low, he still has more than enough platelets to do the clotting his body needs.  Furthermore, although his hemoglobin is mildly low, it  is much better today than what it has been over these past months.  I will see him back in 1 month to go over all of his labs collected today, as well as to reassess his peripheral counts at that time.  The patient understands all the plans discussed today and is in agreement with them.  I do appreciate Brown-Patram, Melissa J* for his new consult.   Aurielle Slingerland DELENA Kerns, MD

## 2024-01-17 ENCOUNTER — Inpatient Hospital Stay: Attending: Oncology | Admitting: Oncology

## 2024-01-17 ENCOUNTER — Inpatient Hospital Stay

## 2024-01-17 ENCOUNTER — Telehealth: Payer: Self-pay | Admitting: Oncology

## 2024-01-17 ENCOUNTER — Other Ambulatory Visit: Payer: Self-pay | Admitting: Oncology

## 2024-01-17 ENCOUNTER — Encounter: Payer: Self-pay | Admitting: Oncology

## 2024-01-17 VITALS — BP 148/75 | HR 79 | Temp 97.8°F | Resp 16 | Ht 65.0 in | Wt 188.2 lb

## 2024-01-17 DIAGNOSIS — D696 Thrombocytopenia, unspecified: Secondary | ICD-10-CM | POA: Diagnosis present

## 2024-01-17 DIAGNOSIS — D649 Anemia, unspecified: Secondary | ICD-10-CM | POA: Diagnosis not present

## 2024-01-17 DIAGNOSIS — E291 Testicular hypofunction: Secondary | ICD-10-CM | POA: Diagnosis not present

## 2024-01-17 DIAGNOSIS — Z87891 Personal history of nicotine dependence: Secondary | ICD-10-CM | POA: Diagnosis not present

## 2024-01-17 LAB — LACTATE DEHYDROGENASE: LDH: 210 U/L — ABNORMAL HIGH (ref 98–192)

## 2024-01-17 LAB — CBC WITH DIFFERENTIAL (CANCER CENTER ONLY)
Abs Immature Granulocytes: 0.11 K/uL — ABNORMAL HIGH (ref 0.00–0.07)
Basophils Absolute: 0 K/uL (ref 0.0–0.1)
Basophils Relative: 0 %
Eosinophils Absolute: 0 K/uL (ref 0.0–0.5)
Eosinophils Relative: 0 %
HCT: 37.6 % — ABNORMAL LOW (ref 39.0–52.0)
Hemoglobin: 12.1 g/dL — ABNORMAL LOW (ref 13.0–17.0)
Immature Granulocytes: 2 %
Lymphocytes Relative: 20 %
Lymphs Abs: 1.5 K/uL (ref 0.7–4.0)
MCH: 30.6 pg (ref 26.0–34.0)
MCHC: 32.2 g/dL (ref 30.0–36.0)
MCV: 95.2 fL (ref 80.0–100.0)
Monocytes Absolute: 0.9 K/uL (ref 0.1–1.0)
Monocytes Relative: 12 %
Neutro Abs: 4.9 K/uL (ref 1.7–7.7)
Neutrophils Relative %: 66 %
Platelet Count: 97 K/uL — ABNORMAL LOW (ref 150–400)
RBC: 3.95 MIL/uL — ABNORMAL LOW (ref 4.22–5.81)
RDW: 14 % (ref 11.5–15.5)
Smear Review: NORMAL
WBC Count: 7.4 K/uL (ref 4.0–10.5)
nRBC: 0 % (ref 0.0–0.2)

## 2024-01-17 LAB — CMP (CANCER CENTER ONLY)
ALT: 18 U/L (ref 0–44)
AST: 17 U/L (ref 15–41)
Albumin: 4.1 g/dL (ref 3.5–5.0)
Alkaline Phosphatase: 72 U/L (ref 38–126)
Anion gap: 15 (ref 5–15)
BUN: 14 mg/dL (ref 8–23)
CO2: 20 mmol/L — ABNORMAL LOW (ref 22–32)
Calcium: 9.5 mg/dL (ref 8.9–10.3)
Chloride: 103 mmol/L (ref 98–111)
Creatinine: 1.15 mg/dL (ref 0.61–1.24)
GFR, Estimated: >60 mL/min (ref >60)
Glucose, Bld: 167 mg/dL — ABNORMAL HIGH (ref 70–99)
Potassium: 4.1 mmol/L (ref 3.5–5.1)
Sodium: 138 mmol/L (ref 135–145)
Total Bilirubin: 0.5 mg/dL (ref 0.0–1.2)
Total Protein: 8 g/dL (ref 6.5–8.1)

## 2024-01-17 LAB — VITAMIN B12: Vitamin B-12: 1376 pg/mL — ABNORMAL HIGH (ref 180–914)

## 2024-01-17 LAB — FOLATE: Folate: 10.7 ng/mL (ref >5.9)

## 2024-01-17 NOTE — Telephone Encounter (Signed)
 Patient has been scheduled for follow-up visit per 01/17/24 LOS.  Pt given an appt calendar with date and time.

## 2024-01-19 LAB — PROTEIN ELECTROPHORESIS, SERUM
A/G Ratio: 0.8 (ref 0.7–1.7)
Albumin ELP: 3.4 g/dL (ref 2.9–4.4)
Alpha-1-Globulin: 0.3 g/dL (ref 0.0–0.4)
Alpha-2-Globulin: 1.1 g/dL — ABNORMAL HIGH (ref 0.4–1.0)
Beta Globulin: 1.4 g/dL — ABNORMAL HIGH (ref 0.7–1.3)
Gamma Globulin: 1.3 g/dL (ref 0.4–1.8)
Globulin, Total: 4.1 g/dL — ABNORMAL HIGH (ref 2.2–3.9)
M-Spike, %: 0.4 g/dL — ABNORMAL HIGH
Total Protein ELP: 7.5 g/dL (ref 6.0–8.5)

## 2024-01-20 ENCOUNTER — Telehealth: Payer: Self-pay

## 2024-01-20 NOTE — Telephone Encounter (Signed)
 [facility] Clinical Social Work  Clinical Social Work was referred by [employer] screen protocol for SDOH needs.  Clinical Social Worker attempted to contact patient by phone to offer support and assess for needs.  CSW left voicemail providing patient with general information about resources available, including Radiographer, therapeutic.  Follow Up Plan:  No follow up scheduled. Patient has direct contact if needed.    Lizbeth Sprague, LCSW  Clinical Social Worker [facility]

## 2024-02-15 ENCOUNTER — Other Ambulatory Visit: Payer: Self-pay | Admitting: Oncology

## 2024-02-15 DIAGNOSIS — D696 Thrombocytopenia, unspecified: Secondary | ICD-10-CM

## 2024-02-16 NOTE — Progress Notes (Unsigned)
 [facility] at [facility] [address] [place],  [place]  [address] [phone]  Clinic Day:  01/17/2024  Referring physician: Thurmond Cathlyn LABOR., MD   HISTORY OF PRESENT ILLNESS:  The patient is a 73 y.o. male  who I was asked to consult upon for thrombocytopenia.  Recent labs at his primary care office showed a mildly low platelet count of 140.  His hemoglobin was mildly low at 10.8.  Iron studies showed no evidence of iron deficiency anemia being present.  With respect to his thrombocytopenia, the patient denies having any history of hepatosplenic disease.  He also denies being placed on any new medications which have the ability to cause thrombocytopenia via bone marrow suppression.  He has had night sweats for 7 years, but denies having any fevers, weight loss, lymphadenopathy, or other findings which concern him for his thrombocytopenia being due to an underlying hematologic malignancy.  To his knowledge, there is no family history of thrombocytopenia or other hematologic disorders.  PAST MEDICAL HISTORY:   Past Medical History:  Diagnosis Date  . Acute hypoxic respiratory failure (HCC) 11/17/2023  . Anemia 09/28/2019  . Arthritis    hands, back (03/02/2018)  . ASHD (arteriosclerotic heart disease) 08/27/2015  . B12 deficiency 05/21/2022  . Body mass index (BMI) 34.0-34.9, adult 03/15/2019  . Chronic bilateral low back pain with bilateral sciatica 01/22/2020  . Chronic pain of left knee 09/28/2019  . Chronic pain syndrome 01/22/2020  . Chronic radicular low back pain   . Coronary artery disease    a. MI in 2002 s/p stenting to mRCA b.cath 11/2016 s/p DES to mLAD c. Cath 08/2017- patent LAD stent, 40% instent restenosis of mRCA, 99% posterolateral artery s/p PTCA & DES.  SABRA Disc displacement, lumbar 05/10/2019  . Dyslipidemia 11/17/2016  . Dyspnea   . Essential (primary) hypertension 03/15/2019  . Essential hypertension 08/27/2015  . Facial lesion 11/29/2020   . GERD (gastroesophageal reflux disease)   . Gout    on RX qod (03/02/2018)  . Heart attack (HCC) 2002  . History of bacterial pneumonia 07/22/2020  . History of kidney stones   . Hyperlipidemia   . Hypertension   . 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 Kidney stone 08/27/2015  . Long-term use of high-risk medication 08/27/2015  . Lumbago with sciatica, right side 01/22/2020  . Lumbar foraminal stenosis 12/06/2020  . Lumbar post-laminectomy syndrome 06/08/2019  . Lumbar stenosis with neurogenic claudication 11/11/2021  . Malaise and fatigue 08/27/2015  . Mixed hyperlipidemia 08/27/2015  . Non-seasonal allergic rhinitis 07/04/2018  . Obstructive sleep apnea 11/17/2016  . Pneumonia   . 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 Precordial chest pain 11/24/2017  . Prediabetes 08/27/2015  . Radiculopathy, lumbar region 03/15/2019  . S/P lumbar fusion 11/15/2023  . S/P lumbar spine operation 11/15/2023  . Shortness of breath 11/17/2016  . Spinal stenosis, 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  . Stage 3 chronic kidney disease (HCC) 08/27/2015   Patient unaware.  Creatine on 11/2021 was 1.27.  SABRA Thrombocytopenia 11/23/2023  . Tremor 05/30/2020    PAST SURGICAL HISTORY:   Past Surgical History:  Procedure Laterality Date  . ANTERIOR LAT LUMBAR FUSION N/A 02/23/2023   Procedure: extreme Lateral Interbody Fusion - Lumbar two-Lumbar three - Interbody Fusion;  Surgeon: Colon Shove, MD;  Location: [facility] OR;  Service: Neurosurgery;  Laterality: N/A;  . APPENDECTOMY  1974  . BACK SURGERY    . CATARACT EXTRACTION Bilateral   . CORONARY ANGIOPLASTY  WITH STENT PLACEMENT  2002; 11/20/2016   @ [facility]; @ [facility]  . CORONARY CTO INTERVENTION N/A 03/02/2018   Procedure: CORONARY CTO INTERVENTION - Right;  Surgeon: Jordan, Peter M, MD;  Location: [facility] INVASIVE CV LAB;  Service: Cardiovascular;  Laterality: N/A;  . CORONARY PRESSURE/FFR STUDY N/A 09/16/2017    Procedure: INTRAVASCULAR PRESSURE WIRE/FFR STUDY;  Surgeon: Claudene Victory ORN, MD;  Location: [facility] INVASIVE CV LAB;  Service: Cardiovascular;  Laterality: N/A;  . CORONARY STENT INTERVENTION N/A 11/20/2016   Procedure: CORONARY STENT INTERVENTION;  Surgeon: Mady Bruckner, MD;  Location: [facility] INVASIVE CV LAB;  Service: Cardiovascular;  Laterality: N/A;  . CORONARY STENT INTERVENTION N/A 09/09/2017   Procedure: CORONARY STENT INTERVENTION;  Surgeon: Verlin Bruckner BIRCH, MD;  Location: [facility] INVASIVE CV LAB;  Service: Cardiovascular;  Laterality: N/A;  . CORONARY ULTRASOUND/IVUS N/A 11/20/2016   Procedure: Intravascular Ultrasound/IVUS;  Surgeon: Mady Bruckner, MD;  Location: [facility] INVASIVE CV LAB;  Service: Cardiovascular;  Laterality: N/A;  . HEMI-MICRODISCECTOMY LUMBAR LAMINECTOMY LEVEL 1 Left 2008   L4  . KNEE ARTHROSCOPY Left 1983  . LEFT HEART CATH AND CORONARY ANGIOGRAPHY N/A 11/20/2016   Procedure: LEFT HEART CATH AND CORONARY ANGIOGRAPHY;  Surgeon: Mady Bruckner, MD;  Location: [facility] INVASIVE CV LAB;  Service: Cardiovascular;  Laterality: N/A;  . LEFT HEART CATH AND CORONARY ANGIOGRAPHY N/A 09/09/2017   Procedure: LEFT HEART CATH AND CORONARY ANGIOGRAPHY;  Surgeon: Verlin Bruckner BIRCH, MD;  Location: [facility] INVASIVE CV LAB;  Service: Cardiovascular;  Laterality: N/A;  . LEFT HEART CATH AND CORONARY ANGIOGRAPHY N/A 09/16/2017   Procedure: LEFT HEART CATH AND CORONARY ANGIOGRAPHY;  Surgeon: Claudene Victory ORN, MD;  Location: [facility] INVASIVE CV LAB;  Service: Cardiovascular;  Laterality: N/A;  . LEFT HEART CATH AND CORONARY ANGIOGRAPHY N/A 02/21/2018   Procedure: LEFT HEART CATH AND CORONARY ANGIOGRAPHY;  Surgeon: Wonda Sharper, MD;  Location: [facility] INVASIVE CV LAB;  Service: Cardiovascular;  Laterality: N/A;  . LUMBAR FUSION  12/11/2020   L1-2-3-4  . LUMBAR LAMINECTOMY/DECOMPRESSION MICRODISCECTOMY N/A 11/11/2021   Procedure: LUMBAR THREE-FOUR SUBLAMINAR DECOMPRESSION;  Surgeon: Colon Victory, MD;   Location: [facility] OR;  Service: Neurosurgery;  Laterality: N/A;  . TONSILLECTOMY    . ULTRASOUND GUIDANCE FOR VASCULAR ACCESS  03/02/2018   Procedure: Ultrasound Guidance For Vascular Access;  Surgeon: Jordan, Peter M, MD;  Location[facility] INVASIVE CV LAB;  Service: Cardiovascular;;  . VASECTOMY    . WISDOM TOOTH EXTRACTION      CURRENT MEDICATIONS:   Current Outpatient Medications  Medication Sig Dispense Refill  . albuterol  (PROVENTIL ) (2.5 MG/3ML) 0.083% nebulizer solution Take 3 mLs (2.5 mg total) by nebulization every 4 (four) hours as needed for wheezing or shortness of breath. 90 mL 12  . allopurinol  (ZYLOPRIM ) 100 MG tablet Take 100 mg by mouth daily.  3  . amLODipine  (NORVASC ) 10 MG tablet TAKE ONE TABLET BY MOUTH ONCE DAILY 90 tablet 0  . aspirin  EC 81 MG tablet Take 1 tablet (81 mg total) by mouth daily. 90 tablet 3  . clopidogrel  (PLAVIX ) 75 MG tablet TAKE ONE TABLET BY MOUTH ONCE DAILY WITH BREAKFAST 90 tablet 2  . cyanocobalamin (,VITAMIN B-12,) 1000 MCG/ML injection Inject 1,000 mcg into the muscle every 30 (thirty) days.    . gabapentin  (NEURONTIN ) 300 MG capsule Take 300 mg by mouth 3 (three) times daily.    . glipiZIDE  (GLUCOTROL  XL) 5 MG 24 hr tablet Take 5 mg by mouth daily.    . HYDROmorphone  (DILAUDID ) 2 MG  tablet Take 1 tablet (2 mg total) by mouth every 6 (six) hours as needed for severe pain (pain score 7-10). 30 tablet 0  . isosorbide  mononitrate (IMDUR ) 60 MG 24 hr tablet Take 1 tablet (60 mg total) by mouth daily. 90 tablet 3  . methocarbamol  (ROBAXIN ) 500 MG tablet Take 1 tablet (500 mg total) by mouth every 6 (six) hours as needed for muscle spasms. 30 tablet 3  . metoprolol  tartrate (LOPRESSOR ) 50 MG tablet TAKE 1 AND 1/2 TABLETS BY MOUTH TWICE DAILY 270 tablet 1  . naproxen (NAPROSYN) 500 MG tablet Take 500 mg by mouth 2 (two) times daily as needed for mild pain (pain score 1-3) or moderate pain (pain score 4-6).    . nitroGLYCERIN  (NITROSTAT ) 0.4 MG SL tablet  DISSOLVE 1 TABLET UNDER THE TONGUE EVERY 5 MINUTES AS NEEDED FOR CHEST PAIN. DO NOT EXCEED A TOTAL OF 3 DOSES IN 15 MINUTES. (Patient taking differently: Place 0.4 mg under the tongue every 5 (five) minutes as needed for chest pain.) 25 tablet 1  . ranolazine  (RANEXA ) 1000 MG SR tablet Take 1 tablet (1,000 mg total) by mouth 2 (two) times daily. 180 tablet 1  . rosuvastatin  (CRESTOR ) 40 MG tablet TAKE ONE TABLET BY MOUTH ONCE DAILY 90 tablet 0  . tiZANidine  (ZANAFLEX ) 4 MG capsule Take 4 mg by mouth 3 (three) times daily as needed for muscle spasms.    . VASCEPA  1 g capsule Take 2 capsules (2 g total) by mouth 2 times daily at 12 noon and 4 pm. 120 capsule 2  . Vitamin D , Ergocalciferol , (DRISDOL ) 50000 units CAPS capsule Take 50,000 Units by mouth every Sunday.   3   No current facility-administered medications for this visit.    ALLERGIES:   Allergies  Allergen Reactions  . Penicillins Anaphylaxis, Swelling and Rash  . Hydrocodone Nausea Only    Sick on stomach all day long   . Oxycodone  Nausea Only    Sick on stomach all day long   . Zolpidem Other (See Comments)    dizziness  . Januvia [Sitagliptin] Nausea And Vomiting and Rash    Full body rash    FAMILY HISTORY:   Family History  Problem Relation Age of Onset  . Diabetes Mother   . Cancer Father        UNK  PRIMARY  . Other Sister        MVA  . Suicidality Brother     SOCIAL HISTORY:  The patient was born and raised in [place], [place] .  He currently lives in [place] with his wife of 40 years.  They have 3 children, 7 grandchildren, and 2 great-grandchildren.  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  The patient smoked pipes for nearly 40 years.  He drinks alcohol on rare occasions.  REVIEW OF SYSTEMS:  Review of Systems  Constitutional:  Positive for fatigue. Negative for fever and unexpected weight change.  Respiratory:  Negative for chest tightness, cough, hemoptysis and shortness of breath.    Cardiovascular:  Negative for chest pain and palpitations.  Gastrointestinal:  Negative for abdominal distention, abdominal pain, blood in stool, constipation, diarrhea, nausea and vomiting.  Genitourinary:  Negative for dysuria, frequency and hematuria.   Musculoskeletal:  Positive for arthralgias. Negative for back pain and myalgias.  Skin:  Negative for itching and rash.  Neurological:  Negative for dizziness, headaches and light-headedness.  Psychiatric/Behavioral:  Positive for depression. Negative for suicidal ideas. The  patient is not nervous/anxious.      PHYSICAL EXAM:  There were no vitals taken for this visit. Wt Readings from Last 3 Encounters:  01/17/24 188 lb 3.2 oz (85.4 kg)  11/15/23 200 lb (90.7 kg)  10/20/23 200 lb (90.7 kg)   There is no height or weight on file to calculate BMI. Performance status (ECOG): 1 - Symptomatic but completely ambulatory Physical Exam Constitutional:      Appearance: Normal appearance. He is not ill-appearing.     Comments: A chronically ill appearing gentleman in no acute distress  HENT:     Mouth/Throat:     Mouth: Mucous membranes are moist.     Pharynx: Oropharynx is clear. No oropharyngeal exudate or posterior oropharyngeal erythema.  Cardiovascular:     Rate and Rhythm: Normal rate and regular rhythm.     Heart sounds: No murmur heard.    No friction rub. No gallop.  Pulmonary:     Effort: Pulmonary effort is normal. No respiratory distress.     Breath sounds: Normal breath sounds. No wheezing, rhonchi or rales.  Abdominal:     General: Bowel sounds are normal. There is no distension.     Palpations: Abdomen is soft. There is no mass.     Tenderness: There is no abdominal tenderness.  Musculoskeletal:        General: No swelling.     Right lower leg: No edema.     Left lower leg: No edema.  Lymphadenopathy:     Cervical: No cervical adenopathy.     Upper Body:     Right upper body: No supraclavicular or axillary  adenopathy.     Left upper body: No supraclavicular or axillary adenopathy.     Lower Body: No right inguinal adenopathy. No left inguinal adenopathy.  Skin:    General: Skin is warm.     Coloration: Skin is not jaundiced.     Findings: No lesion or rash.  Neurological:     General: No focal deficit present.     Mental Status: He is alert and oriented to person, place, and time. Mental status is at baseline.  Psychiatric:        Mood and Affect: Mood normal.        Behavior: Behavior normal.        Thought Content: Thought content normal.    LABS:      Latest Ref Rng & Units 01/17/2024   10:50 AM 11/20/2023   11:49 AM 11/19/2023    6:53 PM  CBC  WBC 4.0 - 10.5 K/uL 7.4  14.4    Hemoglobin 13.0 - 17.0 g/dL 87.8  9.4  8.2   Hematocrit 39.0 - 52.0 % 37.6  28.1  24.3   Platelets 150 - 400 K/uL 97  108        Latest Ref Rng & Units 01/17/2024   10:50 AM 11/20/2023    5:37 AM 11/19/2023    8:59 AM  CMP  Glucose 70 - 99 mg/dL 832  847  784   BUN 8 - 23 mg/dL 14  21  22    Creatinine 0.61 - 1.24 mg/dL 8.84  8.59  8.50   Sodium 135 - 145 mmol/L 138  132  132   Potassium 3.5 - 5.1 mmol/L 4.1  3.7  3.9   Chloride 98 - 111 mmol/L 103  97  94   CO2 22 - 32 mmol/L 20  25  27    Calcium  8.9 - 10.3  mg/dL 9.5  9.0  8.9   Total Protein 6.5 - 8.1 g/dL 8.0     Total Bilirubin 0.0 - 1.2 mg/dL 0.5     Alkaline Phos 38 - 126 U/L 72     AST 15 - 41 U/L 17     ALT 0 - 44 U/L 18       Latest Reference Range & Units 01/17/24 10:50  Folate >5.9 ng/mL 10.7  Vitamin B12 180 - 914 pg/mL 1,376 (H)  (H): Data is abnormally high  Latest Reference Range & Units 01/17/24 10:50  Total Protein ELP 6.0 - 8.5 g/dL 7.5  Albumin  ELP 2.9 - 4.4 g/dL 3.4  Globulin, Total 2.2 - 3.9 g/dL 4.1 (H) (C)  A/G Ratio 0.7 - 1.7  0.8 (C)  Alpha-1-Globulin 0.0 - 0.4 g/dL 0.3  Joeyj-7-Honalopw 0.4 - 1.0 g/dL 1.1 (H)  Beta Globulin 0.7 - 1.3 g/dL 1.4 (H)  Gamma Globulin 0.4 - 1.8 g/dL 1.3  M-SPIKE, % Not Observed g/dL 0.4  (H)  SPE Interp.  Comment  Comment  Comment  (H): Data is abnormally high (C): Corrected  ASSESSMENT & PLAN:  A 73 y.o. male who I was asked to consult upon for thrombocytopenia.  Of note, labs today also showed mild anemia.  For completeness, I will check his liver enzymes today to ensure no type of hepatosplenic disease factoring into his thrombocytopenia.  His B12 and folate levels will also be checked to ensure there are no nutritional deficiencies factoring into his thrombocytopenia.  As he is mildly anemic, I will check a serum protein electrophoresis to ensure an underlying plasma cell dyscrasia is not present.  I made sure the patient understood that, although low, he still has more than enough platelets to do the clotting his body needs.  Furthermore, although his hemoglobin is mildly low, it is much better today than what it has been over these past months.  I will see him back in 1 month to go over all of his labs collected today, as well as to reassess his peripheral counts at that time.  The patient understands all the plans discussed today and is in agreement with them.  I do appreciate Thurmond Cathlyn LABOR., MD for his new consult.   Amardeep Beckers LABOR [place], MD

## 2024-02-17 ENCOUNTER — Inpatient Hospital Stay: Attending: Oncology | Admitting: Oncology

## 2024-02-17 ENCOUNTER — Telehealth: Payer: Self-pay | Admitting: Oncology

## 2024-02-17 ENCOUNTER — Inpatient Hospital Stay

## 2024-02-17 ENCOUNTER — Encounter: Payer: Self-pay | Admitting: Cardiology

## 2024-02-17 ENCOUNTER — Other Ambulatory Visit: Payer: Self-pay | Admitting: Oncology

## 2024-02-17 ENCOUNTER — Ambulatory Visit: Attending: Cardiology | Admitting: Cardiology

## 2024-02-17 VITALS — BP 118/68 | HR 73 | Temp 97.9°F | Resp 16 | Ht 65.0 in | Wt 194.6 lb

## 2024-02-17 VITALS — BP 128/82 | HR 84 | Ht 65.0 in | Wt 194.0 lb

## 2024-02-17 DIAGNOSIS — L989 Disorder of the skin and subcutaneous tissue, unspecified: Secondary | ICD-10-CM | POA: Diagnosis not present

## 2024-02-17 DIAGNOSIS — D649 Anemia, unspecified: Secondary | ICD-10-CM | POA: Insufficient documentation

## 2024-02-17 DIAGNOSIS — D472 Monoclonal gammopathy: Secondary | ICD-10-CM | POA: Insufficient documentation

## 2024-02-17 DIAGNOSIS — D696 Thrombocytopenia, unspecified: Secondary | ICD-10-CM

## 2024-02-17 DIAGNOSIS — E782 Mixed hyperlipidemia: Secondary | ICD-10-CM

## 2024-02-17 DIAGNOSIS — I25118 Atherosclerotic heart disease of native coronary artery with other forms of angina pectoris: Secondary | ICD-10-CM

## 2024-02-17 DIAGNOSIS — G894 Chronic pain syndrome: Secondary | ICD-10-CM

## 2024-02-17 LAB — CBC WITH DIFFERENTIAL (CANCER CENTER ONLY)
Abs Immature Granulocytes: 0.15 K/uL — ABNORMAL HIGH (ref 0.00–0.07)
Basophils Absolute: 0 K/uL (ref 0.0–0.1)
Basophils Relative: 0 %
Eosinophils Absolute: 0 K/uL (ref 0.0–0.5)
Eosinophils Relative: 0 %
HCT: 34.4 % — ABNORMAL LOW (ref 39.0–52.0)
Hemoglobin: 11.2 g/dL — ABNORMAL LOW (ref 13.0–17.0)
Immature Granulocytes: 2 %
Lymphocytes Relative: 26 %
Lymphs Abs: 2.6 K/uL (ref 0.7–4.0)
MCH: 30.8 pg (ref 26.0–34.0)
MCHC: 32.6 g/dL (ref 30.0–36.0)
MCV: 94.5 fL (ref 80.0–100.0)
Monocytes Absolute: 2.1 K/uL — ABNORMAL HIGH (ref 0.1–1.0)
Monocytes Relative: 21 %
Neutro Abs: 5.3 K/uL (ref 1.7–7.7)
Neutrophils Relative %: 51 %
Platelet Count: 100 K/uL — ABNORMAL LOW (ref 150–400)
RBC: 3.64 MIL/uL — ABNORMAL LOW (ref 4.22–5.81)
RDW: 14.8 % (ref 11.5–15.5)
WBC Count: 10.2 K/uL (ref 4.0–10.5)
nRBC: 0 % (ref 0.0–0.2)

## 2024-02-17 LAB — IRON AND TIBC
Iron: 79 ug/dL (ref 45–182)
Saturation Ratios: 20 % (ref 17.9–39.5)
TIBC: 405 ug/dL (ref 250–450)
UIBC: 326 ug/dL

## 2024-02-17 LAB — FERRITIN: Ferritin: 120 ng/mL (ref 24–336)

## 2024-02-17 NOTE — Telephone Encounter (Signed)
 Patient has been scheduled for follow-up visit per 02/17/24 LOS.  Pt given an appt calendar with date and time.

## 2024-02-17 NOTE — Addendum Note (Signed)
 Addended by: ONEITA BERLINER on: 02/17/2024 08:23 AM   Modules accepted: Orders

## 2024-02-17 NOTE — Progress Notes (Signed)
 Cardiology Office Note:    Date:  02/17/2024   ID:  Robert Ramos, DOB 1950-06-23, MRN [id number]  PCP:  Thurmond Cathlyn LABOR., MD  Cardiologist:  Lamar Fitch, MD    Referring MD: Thurmond Cathlyn LABOR., MD   No chief complaint on file.   History of Present Illness:    Robert Ramos is a 73 y.o. male   with coronary artery disease status post PCI of the LAD in 2018, status post PCI of the posterior lateral branch of the RCA in May 2019, hypertension, hyperlipidemia.  He has had continued symptoms of exertional chest discomfort and shortness of breath at low level activity despite treatment with multidrug antianginal therapy.  STress testing was abnormal.  He was referred to Dr. Wonda for further evaluation.  Cardiac catheterization demonstrated moderate LAD stenosis unchanged from previous study, patent LCx with small vessel disease involving the OM branches not amenable to PCI and interval occlusion of the mid RCA with left-to-right collaterals.  Patient was referred to Dr. Jordan for possible CTO PCI of the RCA.  He underwent attempted CTO PCI of the RCA but this was unsuccessful.  Continued medical therapy was recommended.  Comes today to months for follow-up.  Cardiac wise doing well denies any chest pain tightness squeezing pressure burning chest.  Since have seen him last time he did have carpal tunnel syndrome surgery which went well and also back surgery that did not go well.  He spent a week in the hospital with pneumonia as well as anemia.  He is following up with hematology for his anemia.  He said surgery on the back helped some.  Past Medical History:  Diagnosis Date   Acute hypoxic respiratory failure (HCC) 11/17/2023   Anemia 09/28/2019   Arthritis    hands, back (03/02/2018)   ASHD (arteriosclerotic heart disease) 08/27/2015   B12 deficiency 05/21/2022   Body mass index (BMI) 34.0-34.9, adult 03/15/2019   Chronic bilateral low back pain with bilateral sciatica  01/22/2020   Chronic pain of left knee 09/28/2019   Chronic pain syndrome 01/22/2020   Chronic radicular low back pain    Coronary artery disease    a. MI in 2002 s/p stenting to mRCA b.cath 11/2016 s/p DES to mLAD c. Cath 08/2017- patent LAD stent, 40% instent restenosis of mRCA, 99% posterolateral artery s/p PTCA & DES.   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   Kidney stone 08/27/2015   Long-term use of high-risk medication 08/27/2015   Lumbago with sciatica, right side 01/22/2020   Lumbar foraminal stenosis 12/06/2020   Lumbar post-laminectomy syndrome 06/08/2019   Lumbar stenosis with neurogenic claudication 11/11/2021   Malaise and fatigue 08/27/2015   Mixed hyperlipidemia 08/27/2015   Non-seasonal allergic rhinitis 07/04/2018   Obstructive sleep apnea 11/17/2016   Pneumonia    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  Creatine on 11/2021 was 1.27.   Thrombocytopenia 11/23/2023   Tremor 05/30/2020    Past Surgical History:  Procedure Laterality Date   ANTERIOR LAT LUMBAR FUSION N/A 02/23/2023   Procedure: extreme Lateral Interbody Fusion - Lumbar two-Lumbar three - Interbody Fusion;  Surgeon: Colon Shove, MD;  Location: [facility] OR;  Service:  Neurosurgery;  Laterality: N/A;   APPENDECTOMY  1974   BACK SURGERY     CATARACT EXTRACTION Bilateral    CORONARY ANGIOPLASTY WITH STENT PLACEMENT  2002; 11/20/2016   @ [facility]; @ [facility]   CORONARY CTO INTERVENTION N/A 03/02/2018   Procedure: CORONARY CTO INTERVENTION - Right;  Surgeon: Jordan, Peter M, MD;  Location: [facility] INVASIVE CV LAB;  Service: Cardiovascular;  Laterality: N/A;   CORONARY PRESSURE/FFR STUDY N/A 09/16/2017   Procedure: INTRAVASCULAR PRESSURE WIRE/FFR STUDY;  Surgeon: Claudene Shove ORN, MD;  Location: [facility] INVASIVE CV LAB;  Service: Cardiovascular;  Laterality: N/A;   CORONARY STENT INTERVENTION N/A 11/20/2016   Procedure: CORONARY STENT INTERVENTION;  Surgeon: Mady Bruckner, MD;  Location: [facility] INVASIVE CV LAB;  Service: Cardiovascular;  Laterality: N/A;   CORONARY STENT INTERVENTION N/A 09/09/2017   Procedure: CORONARY STENT INTERVENTION;  Surgeon: Verlin Bruckner BIRCH, MD;  Location: [facility] INVASIVE CV LAB;  Service: Cardiovascular;  Laterality: N/A;   CORONARY ULTRASOUND/IVUS N/A 11/20/2016   Procedure: Intravascular Ultrasound/IVUS;  Surgeon: Mady Bruckner, MD;  Location: [facility] INVASIVE CV LAB;  Service: Cardiovascular;  Laterality: N/A;   HEMI-MICRODISCECTOMY LUMBAR LAMINECTOMY LEVEL 1 Left 2008   L4   KNEE ARTHROSCOPY Left 1983   LEFT HEART CATH AND CORONARY ANGIOGRAPHY N/A 11/20/2016   Procedure: LEFT HEART CATH AND CORONARY ANGIOGRAPHY;  Surgeon: Mady Bruckner, MD;  Location: [facility] INVASIVE CV LAB;  Service: Cardiovascular;  Laterality: N/A;   LEFT HEART CATH AND CORONARY ANGIOGRAPHY N/A 09/09/2017   Procedure: LEFT HEART CATH AND CORONARY ANGIOGRAPHY;  Surgeon: Verlin Bruckner BIRCH, MD;  Location: [facility] INVASIVE CV LAB;  Service: Cardiovascular;  Laterality: N/A;   LEFT HEART CATH AND CORONARY ANGIOGRAPHY N/A 09/16/2017   Procedure: LEFT HEART CATH AND CORONARY ANGIOGRAPHY;  Surgeon: Claudene Shove ORN, MD;  Location: [facility] INVASIVE CV LAB;  Service: Cardiovascular;   Laterality: N/A;   LEFT HEART CATH AND CORONARY ANGIOGRAPHY N/A 02/21/2018   Procedure: LEFT HEART CATH AND CORONARY ANGIOGRAPHY;  Surgeon: Wonda Sharper, MD;  Location: [facility] INVASIVE CV LAB;  Service: Cardiovascular;  Laterality: N/A;   LUMBAR FUSION  12/11/2020   L1-2-3-4   LUMBAR LAMINECTOMY/DECOMPRESSION MICRODISCECTOMY N/A 11/11/2021   Procedure: LUMBAR THREE-FOUR SUBLAMINAR DECOMPRESSION;  Surgeon: Colon Shove, MD;  Location: [facility] OR;  Service: Neurosurgery;  Laterality: N/A;   TONSILLECTOMY     ULTRASOUND GUIDANCE FOR VASCULAR ACCESS  03/02/2018   Procedure: Ultrasound Guidance For Vascular Access;  Surgeon: Jordan, Peter M, MD;  Location: [facility] INVASIVE CV LAB;  Service: Cardiovascular;;   VASECTOMY     WISDOM TOOTH EXTRACTION      Current Medications: Current Meds  Medication Sig   timolol (TIMOPTIC) 0.25 % ophthalmic solution Place 1 drop into the right eye every morning.     Allergies:   Penicillins, Hydrocodone, Oxycodone , Zolpidem, and Januvia [sitagliptin]   Social History   Socioeconomic History   Marital status: Married    Spouse name: DONNA   Number of children: 3   Years of education: GED   Highest education level: Not on  file  Occupational History   Occupation: retired    Comment: journalist, newspaper  Tobacco Use   Smoking status: Some Days    Current packs/day: 0.00    Average packs/day: 1 pack/day for 35.0 years (35.0 ttl pk-yrs)    Types: Cigarettes    Start date: 02/06/1966    Last attempt to quit: 02/06/2001    Years since quitting: 23.0   Smokeless tobacco: Never  Vaping Use   Vaping status: Never Used  Substance and Sexual Activity   Alcohol use: Yes    Alcohol/week: 4.0 standard drinks of alcohol    Types: 4 Cans of beer per week    Comment: OCCASIONAL   Drug use: Never   Sexual activity: Not Currently  Other Topics Concern   Not on file  Social History Narrative   Right handed   Lives at home in a one story home with wife    Retired    Psychologist, prison and probation services    Social Drivers of Health   Financial Resource Strain: Not on file  Food Insecurity: Food Insecurity Present (01/17/2024)   Hunger Vital Sign    Worried About Running Out of Food in the Last Year: Often true    Ran Out of Food in the Last Year: Often true  Transportation Needs: No Transportation Needs (01/17/2024)   PRAPARE - Administrator, Civil Service (Medical): No    Lack of Transportation (Non-Medical): No  Physical Activity: Not on file  Stress: Not on file  Social Connections: Socially Integrated (11/15/2023)   Social Connection and Isolation Panel    Frequency of Communication with Friends and Family: More than three times a week    Frequency of Social Gatherings with Friends and Family: More than three times a week    Attends Religious Services: More than 4 times per year    Active Member of [employer] or Organizations: Yes    Attends Banker Meetings: 1 to 4 times per year    Marital Status: Married     Family History: The patient's family history includes Cancer in his father; Diabetes in his mother; Other in his sister; Suicidality in his brother. ROS:   Please see the history of present illness.    All 14 point review of systems negative except as described per history of present illness  EKGs/Labs/Other Studies Reviewed:         Recent Labs: 11/18/2023: B Natriuretic Peptide 187.5 01/17/2024: ALT 18; BUN 14; Creatinine 1.15; Hemoglobin 12.1; Platelet Count 97; Potassium 4.1; Sodium 138  Recent Lipid Panel    Component Value Date/Time   CHOL 116 07/25/2019 0813   TRIG 239 (H) 07/25/2019 0813   HDL 35 (L) 07/25/2019 0813   CHOLHDL 3.3 07/25/2019 0813   CHOLHDL 4.3 09/09/2017 0352   VLDL 75 (H) 09/09/2017 0352   LDLCALC 43 07/25/2019 0813   LDLDIRECT 45 07/25/2019 0813    Physical Exam:    VS:  BP 128/82   Pulse 84   Ht 5' 5 (1.651 m)   Wt 194 lb (88 kg)   SpO2 96%   BMI 32.28 kg/m     Wt Readings from Last 3  Encounters:  02/17/24 194 lb (88 kg)  01/17/24 188 lb 3.2 oz (85.4 kg)  11/15/23 200 lb (90.7 kg)     GEN:  Well nourished, well developed in no acute distress HEENT: Normal NECK: No JVD; No carotid bruits LYMPHATICS: No lymphadenopathy CARDIAC: RRR, no murmurs, no rubs,  no gallops RESPIRATORY:  Clear to auscultation without rales, wheezing or rhonchi  ABDOMEN: Soft, non-tender, non-distended MUSCULOSKELETAL:  No edema; No deformity  SKIN: Warm and dry LOWER EXTREMITIES: no swelling NEUROLOGIC:  Alert and oriented x 3 PSYCHIATRIC:  Normal affect   ASSESSMENT:    1. Coronary artery disease of native artery of native heart with stable angina pectoris   2. Chronic pain syndrome   3. Mixed hyperlipidemia    PLAN:    In order of problems listed above:  Coronary disease stable from that point review will continue present medications.  Chronic pain syndrome there are some conversation about potential pain stimulator which should help with his symptomatology. Dyslipidemia has been followed by his primary care physician, I did review blood test done by his primary care physician in September of this year HDL 31 LDL 42.  Will continue present management. Carpal tunnel syndrome I will schedule him to have echocardiogram to make sure he does not have any evidence of amyloid.   Medication Adjustments/Labs and Tests Ordered: Current medicines are reviewed at length with the patient today.  Concerns regarding medicines are outlined above.  No orders of the defined types were placed in this encounter.  Medication changes: No orders of the defined types were placed in this encounter.   Signed, Lamar DOROTHA Fitch, MD, [facility] 02/17/2024 8:19 AM    [facility]

## 2024-02-17 NOTE — Patient Instructions (Signed)
 Medication Instructions:  Your physician recommends that you continue on your current medications as directed. Please refer to the Current Medication list given to you today.  *If you need a refill on your cardiac medications before your next appointment, please call your pharmacy*   Lab Work: None ordered If you have labs (blood work) drawn today and your tests are completely normal, you will receive your results only by: MyChart Message (if you have MyChart) OR A paper copy in the mail If you have any lab test that is abnormal or we need to change your treatment, we will call you to review the results.  Testing/Procedures: Your physician has requested that you have an echocardiogram. Echocardiography is a painless test that uses sound waves to create images of your heart. It provides your doctor with information about the size and shape of your heart and how well your heart's chambers and valves are working. This procedure takes approximately one hour. There are no restrictions for this procedure. Please do NOT wear cologne, perfume, aftershave, or lotions (deodorant is allowed). Please arrive 15 minutes prior to your appointment time.  Please note: We ask at that you not bring children with you during ultrasound (echo/ vascular) testing. Due to room size and safety concerns, children are not allowed in the ultrasound rooms during exams. Our front office staff cannot provide observation of children in our lobby area while testing is being conducted. An adult accompanying a patient to their appointment will only be allowed in the ultrasound room at the discretion of the ultrasound technician under special circumstances. We apologize for any inconvenience.  Follow-Up: At [facility], you and your health needs are our priority.  As part of our continuing mission to provide you with exceptional heart care, we have created designated Provider Care Teams.  These Care Teams include your primary  Cardiologist (physician) and Advanced Practice Providers (APPs -  Physician Assistants and Nurse Practitioners) who all work together to provide you with the care you need, when you need it.  We recommend signing up for the patient portal called MyChart.  Sign up information is provided on this After Visit Summary.  MyChart is used to connect with patients for Virtual Visits (Telemedicine).  Patients are able to view lab/test results, encounter notes, upcoming appointments, etc.  Non-urgent messages can be sent to your provider as well.   To learn more about what you can do with MyChart, go to [email or web address].    Your next appointment:   6 month(s)  The format for your next appointment:   In Person  Provider:   Lamar Fitch, MD   Other Instructions Echocardiogram An echocardiogram is a test that uses sound waves (ultrasound) to produce images of the heart. Images from an echocardiogram can provide important information about: Heart size and shape. The size and thickness and movement of your heart's walls. Heart muscle function and strength. Heart valve function or if you have stenosis. Stenosis is when the heart valves are too narrow. If blood is flowing backward through the heart valves (regurgitation). A tumor or infectious growth around the heart valves. Areas of heart muscle that are not working well because of poor blood flow or injury from a heart attack. Aneurysm detection. An aneurysm is a weak or damaged part of an artery wall. The wall bulges out from the normal force of blood pumping through the body. Tell a health care provider about: Any allergies you have. All medicines you are taking, including vitamins, herbs,  eye drops, creams, and over-the-counter medicines. Any blood disorders you have. Any surgeries you have had. Any medical conditions you have. Whether you are pregnant or may be pregnant. What are the risks? Generally, this is a safe test.  However, problems may occur, including an allergic reaction to dye (contrast) that may be used during the test. What happens before the test? No specific preparation is needed. You may eat and drink normally. What happens during the test? You will take off your clothes from the waist up and put on a hospital gown. Electrodes or electrocardiogram (ECG)patches may be placed on your chest. The electrodes or patches are then connected to a device that monitors your heart rate and rhythm. You will lie down on a table for an ultrasound exam. A gel will be applied to your chest to help sound waves pass through your skin. A handheld device, called a transducer, will be pressed against your chest and moved over your heart. The transducer produces sound waves that travel to your heart and bounce back (or echo back) to the transducer. These sound waves will be captured in real-time and changed into images of your heart that can be viewed on a video monitor. The images will be recorded on a computer and reviewed by your health care provider. You may be asked to change positions or hold your breath for a short time. This makes it easier to get different views or better views of your heart. In some cases, you may receive contrast through an IV in one of your veins. This can improve the quality of the pictures from your heart. The procedure may vary among health care providers and hospitals.   What can I expect after the test? You may return to your normal, everyday life, including diet, activities, and medicines, unless your health care provider tells you not to do that. Follow these instructions at home: It is up to you to get the results of your test. Ask your health care provider, or the department that is doing the test, when your results will be ready. Keep all follow-up visits. This is important. Summary An echocardiogram is a test that uses sound waves (ultrasound) to produce images of the heart. Images  from an echocardiogram can provide important information about the size and shape of your heart, heart muscle function, heart valve function, and other possible heart problems. You do not need to do anything to prepare before this test. You may eat and drink normally. After the echocardiogram is completed, you may return to your normal, everyday life, unless your health care provider tells you not to do that. This information is not intended to replace advice given to you by your health care provider. Make sure you discuss any questions you have with your health care provider. Document Revised: 11/21/2019 Document Reviewed: 11/21/2019 Elsevier Patient Education  2021 Elsevier Inc.   Important Information About Sugar

## 2024-02-18 LAB — KAPPA/LAMBDA LIGHT CHAINS
Kappa free light chain: 31.3 mg/L — ABNORMAL HIGH (ref 3.3–19.4)
Kappa, lambda light chain ratio: 1.06 (ref 0.26–1.65)
Lambda free light chains: 29.5 mg/L — ABNORMAL HIGH (ref 5.7–26.3)

## 2024-02-23 LAB — MULTIPLE MYELOMA PANEL, SERUM
Albumin SerPl Elph-Mcnc: 3.3 g/dL (ref 2.9–4.4)
Albumin/Glob SerPl: 0.9 (ref 0.7–1.7)
Alpha 1: 0.3 g/dL (ref 0.0–0.4)
Alpha2 Glob SerPl Elph-Mcnc: 1.1 g/dL — ABNORMAL HIGH (ref 0.4–1.0)
B-Globulin SerPl Elph-Mcnc: 1.4 g/dL — ABNORMAL HIGH (ref 0.7–1.3)
Gamma Glob SerPl Elph-Mcnc: 1 g/dL (ref 0.4–1.8)
Globulin, Total: 3.8 g/dL (ref 2.2–3.9)
IgA: 450 mg/dL — ABNORMAL HIGH (ref 61–437)
IgG (Immunoglobin G), Serum: 1148 mg/dL (ref 603–1613)
IgM (Immunoglobulin M), Srm: 50 mg/dL (ref 15–143)
Total Protein ELP: 7.1 g/dL (ref 6.0–8.5)

## 2024-02-29 ENCOUNTER — Other Ambulatory Visit: Payer: Self-pay | Admitting: Cardiology

## 2024-03-14 ENCOUNTER — Ambulatory Visit: Attending: Cardiology

## 2024-03-14 DIAGNOSIS — I25118 Atherosclerotic heart disease of native coronary artery with other forms of angina pectoris: Secondary | ICD-10-CM | POA: Diagnosis not present

## 2024-03-14 LAB — ECHOCARDIOGRAM COMPLETE
Area-P 1/2: 4.02 cm2
MV M vel: 4.91 m/s
MV Peak grad: 96.4 mmHg
P 1/2 time: 1173 ms
S' Lateral: 2.8 cm

## 2024-03-17 ENCOUNTER — Telehealth: Payer: Self-pay

## 2024-03-17 ENCOUNTER — Ambulatory Visit: Payer: Self-pay | Admitting: Cardiology

## 2024-03-17 NOTE — Telephone Encounter (Signed)
 Left message on My Chart with Echo results per Dr. Karry note. Routed to PCP.

## 2024-03-21 ENCOUNTER — Telehealth: Payer: Self-pay | Admitting: *Deleted

## 2024-03-21 NOTE — Telephone Encounter (Signed)
   Pre-operative Risk Assessment    Patient Name: Robert Ramos  DOB: 1951-01-08 MRN: [id number]      Request for Surgical Clearance    Procedure:  Vitrectomy with AC washout of right eye  Date of Surgery:  Clearance 04/19/24                                 Surgeon:  Dr. Olam GAILS. [place] [facility] or Practice Name:  [facility] number:  [phone] Fax number:  [phone]   Type of Clearance Requested:   - Pharmacy:  Hold Aspirin  and Clopidogrel  (Plavix ) Aspirin  for 7 days and Plavix  for 5 days prior to surgery   Type of Anesthesia:  IV Sedation   Additional requests/questions:    Signed, Arloa Donovan Dines   03/21/2024, 3:30 PM

## 2024-03-23 ENCOUNTER — Telehealth: Payer: Self-pay

## 2024-03-23 NOTE — Telephone Encounter (Signed)
 Pt scheduled for VV on 12/19

## 2024-03-23 NOTE — Telephone Encounter (Signed)
° °  Name: Robert Ramos  DOB: 12-28-1950  MRN: [id number]  Primary Cardiologist: Robert Fitch, MD   Preoperative team, please contact this patient and set up a phone call appointment for further preoperative risk assessment. Please obtain consent and complete medication review. Thank you for your help.  I confirm that guidance regarding antiplatelet and oral anticoagulation therapy has been completed and, if necessary, noted below.  Per Dr. Krasowski: That should be acceptable to hold those medications so obviously there are always some risk of it all risk risks and benefits need to be considered.  I also confirmed the patient resides in the state of [place] . As per Robert Ramos telemedicine laws, the patient must reside in the state in which the provider is licensed.   Robert CROME Louis, NP 03/23/2024, 1:15 PM [facility]

## 2024-03-23 NOTE — Telephone Encounter (Signed)
°  Patient Consent for Virtual Visit        [name] has provided verbal consent on 03/23/2024 for a virtual visit (video or telephone).   CONSENT FOR VIRTUAL VISIT FOR:  Robert Ramos  By participating in this virtual visit I agree to the following:  I hereby voluntarily request, consent and authorize [facility] and its employed or contracted physicians, physician assistants, nurse practitioners or other licensed health care professionals (the Practitioner), to provide me with telemedicine health care services (the Services) as deemed necessary by the treating Practitioner. I acknowledge and consent to receive the Services by the Practitioner via telemedicine. I understand that the telemedicine visit will involve communicating with the Practitioner through live audiovisual communication technology and the disclosure of certain medical information by electronic transmission. I acknowledge that I have been given the opportunity to request an in-person assessment or other available alternative prior to the telemedicine visit and am voluntarily participating in the telemedicine visit.  I understand that I have the right to withhold or withdraw my consent to the use of telemedicine in the course of my care at any time, without affecting my right to future care or treatment, and that the Practitioner or I may terminate the telemedicine visit at any time. I understand that I have the right to inspect all information obtained and/or recorded in the course of the telemedicine visit and may receive copies of available information for a reasonable fee.  I understand that some of the potential risks of receiving the Services via telemedicine include:  Delay or interruption in medical evaluation due to technological equipment failure or disruption; Information transmitted may not be sufficient (e.g. poor resolution of images) to allow for appropriate medical decision making by the  Practitioner; and/or  In rare instances, security protocols could fail, causing a breach of personal health information.  Furthermore, I acknowledge that it is my responsibility to provide information about my medical history, conditions and care that is complete and accurate to the best of my ability. I acknowledge that Practitioner's advice, recommendations, and/or decision may be based on factors not within their control, such as incomplete or inaccurate data provided by me or distortions of diagnostic images or specimens that may result from electronic transmissions. I understand that the practice of medicine is not an exact science and that Practitioner makes no warranties or guarantees regarding treatment outcomes. I acknowledge that a copy of this consent can be made available to me via my patient portal [facility] MyChart), or I can request a printed copy by calling the office of [facility].    I understand that my insurance will be billed for this visit.   I have read or had this consent read to me. I understand the contents of this consent, which adequately explains the benefits and risks of the Services being provided via telemedicine.  I have been provided ample opportunity to ask questions regarding this consent and the Services and have had my questions answered to my satisfaction. I give my informed consent for the services to be provided through the use of telemedicine in my medical care

## 2024-03-30 ENCOUNTER — Ambulatory Visit: Admitting: Cardiology

## 2024-03-31 ENCOUNTER — Ambulatory Visit: Attending: Internal Medicine | Admitting: Student

## 2024-03-31 DIAGNOSIS — Z01810 Encounter for preprocedural cardiovascular examination: Secondary | ICD-10-CM

## 2024-03-31 NOTE — Progress Notes (Signed)
 "   Virtual Visit via Telephone Note   Because of Robert Ramos's co-morbid illnesses, he is at least at moderate risk for complications without adequate follow up.  This format is felt to be most appropriate for this patient at this time.  The patient did not have access to video technology/had technical difficulties with video requiring transitioning to audio format only (telephone).  All issues noted in this document were discussed and addressed.  No physical exam could be performed with this format.  Please refer to the patient's chart for his consent to telehealth for [facility].  Evaluation Performed:  Preoperative cardiovascular risk assessment _____________   Date:  03/31/2024   Patient ID:  Robert Ramos, DOB January 22, 1951, MRN [id number] Patient Location:  Home Provider location:   Office  Primary Care Provider:  Thurmond Cathlyn LABOR., MD  Primary Cardiologist:  [facility] Providers Cardiologist:  Lamar Fitch, MD    Chief Complaint / Patient Profile   73 y.o. y/o male with a h/o CAD s/p PCI with DES to proximal to mid LAD August 2018, DES to posterolateral artery May 2019; hypertension, hyperlipidemia, OSA, GERD, CKD stage III who is pending vitrectomy with AC washout of right eye by Dr. Cherise and presents today for telephonic preoperative cardiovascular risk assessment.  History of Present Illness    Leonidas Boateng Ramos is a 73 y.o. male who presents via audio/video conferencing for a telehealth visit today.  Pt was last seen in cardiology clinic on 02/17/2024 by Dr. Fitch.  At that time [name] was stable from a cardiac standpoint.  The patient is now pending procedure as outlined above. Since his last visit, he is doing well. Patient denies shortness of breath, dyspnea on exertion, lower extremity edema, orthopnea or PND. No chest pain, pressure, or tightness. No palpitations. He is not experiencing lightheadedness, dizziness,  presyncope or syncope. His activity is limited by back issues. He is able to do light to moderate household activities.   Past Medical History    Past Medical History:  Diagnosis Date   Acute hypoxic respiratory failure (HCC) 11/17/2023   Anemia 09/28/2019   Arthritis    hands, back (03/02/2018)   ASHD (arteriosclerotic heart disease) 08/27/2015   B12 deficiency 05/21/2022   Body mass index (BMI) 34.0-34.9, adult 03/15/2019   Chronic bilateral low back pain with bilateral sciatica 01/22/2020   Chronic pain of left knee 09/28/2019   Chronic pain syndrome 01/22/2020   Chronic radicular low back pain    Coronary artery disease    a. MI in 2002 s/p stenting to mRCA b.cath 11/2016 s/p DES to mLAD c. Cath 08/2017- patent LAD stent, 40% instent restenosis of mRCA, 99% posterolateral artery s/p PTCA & DES.   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   Kidney stone 08/27/2015   Long-term use of high-risk medication 08/27/2015   Lumbago with sciatica, right side 01/22/2020   Lumbar foraminal stenosis 12/06/2020   Lumbar post-laminectomy syndrome 06/08/2019   Lumbar stenosis with neurogenic claudication 11/11/2021   Malaise and fatigue 08/27/2015  Mixed hyperlipidemia 08/27/2015   Non-seasonal allergic rhinitis 07/04/2018   Obstructive sleep apnea 11/17/2016   Pneumonia    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  Creatine on 11/2021 was 1.27.   Thrombocytopenia 11/23/2023   Tremor 05/30/2020   Past Surgical History:  Procedure Laterality Date   ANTERIOR LAT LUMBAR FUSION N/A 02/23/2023   Procedure: extreme Lateral Interbody Fusion - Lumbar two-Lumbar three - Interbody Fusion;  Surgeon: Colon Shove, MD;  Location: [facility] OR;  Service: Neurosurgery;  Laterality: N/A;   APPENDECTOMY  1974   BACK SURGERY     CATARACT EXTRACTION Bilateral    CORONARY ANGIOPLASTY WITH STENT PLACEMENT  2002; 11/20/2016   @ [facility]; @ [facility]   CORONARY CTO INTERVENTION N/A 03/02/2018   Procedure: CORONARY CTO INTERVENTION - Right;  Surgeon: Jordan, Peter M, MD;  Location: [facility] INVASIVE CV LAB;  Service: Cardiovascular;  Laterality: N/A;   CORONARY PRESSURE/FFR STUDY N/A 09/16/2017   Procedure: INTRAVASCULAR PRESSURE WIRE/FFR STUDY;  Surgeon: Claudene Shove ORN, MD;  Location: [facility] INVASIVE CV LAB;  Service: Cardiovascular;  Laterality: N/A;   CORONARY STENT INTERVENTION N/A 11/20/2016   Procedure: CORONARY STENT INTERVENTION;  Surgeon: Mady Bruckner, MD;  Location: [facility] INVASIVE CV LAB;  Service: Cardiovascular;  Laterality: N/A;   CORONARY STENT INTERVENTION N/A 09/09/2017   Procedure: CORONARY STENT INTERVENTION;  Surgeon: Verlin Bruckner BIRCH, MD;  Location: [facility] INVASIVE CV LAB;  Service: Cardiovascular;  Laterality: N/A;   CORONARY ULTRASOUND/IVUS N/A 11/20/2016   Procedure: Intravascular Ultrasound/IVUS;  Surgeon: Mady Bruckner, MD;  Location: [facility] INVASIVE CV LAB;  Service: Cardiovascular;  Laterality: N/A;   HEMI-MICRODISCECTOMY LUMBAR LAMINECTOMY LEVEL 1 Left 2008   L4   KNEE ARTHROSCOPY Left 1983   LEFT HEART CATH AND CORONARY ANGIOGRAPHY N/A 11/20/2016   Procedure: LEFT HEART CATH AND CORONARY ANGIOGRAPHY;  Surgeon: Mady Bruckner, MD;   Location: [facility] INVASIVE CV LAB;  Service: Cardiovascular;  Laterality: N/A;   LEFT HEART CATH AND CORONARY ANGIOGRAPHY N/A 09/09/2017   Procedure: LEFT HEART CATH AND CORONARY ANGIOGRAPHY;  Surgeon: Verlin Bruckner BIRCH, MD;  Location: [facility] INVASIVE CV LAB;  Service: Cardiovascular;  Laterality: N/A;   LEFT HEART CATH AND CORONARY ANGIOGRAPHY N/A 09/16/2017   Procedure: LEFT HEART CATH AND CORONARY ANGIOGRAPHY;  Surgeon: Claudene Shove ORN, MD;  Location: [facility] INVASIVE CV LAB;  Service: Cardiovascular;  Laterality: N/A;   LEFT HEART CATH AND CORONARY ANGIOGRAPHY N/A 02/21/2018   Procedure: LEFT HEART CATH AND CORONARY ANGIOGRAPHY;  Surgeon: Wonda Sharper, MD;  Location: [facility] INVASIVE CV LAB;  Service: Cardiovascular;  Laterality: N/A;   LUMBAR FUSION  12/11/2020   L1-2-3-4   LUMBAR LAMINECTOMY/DECOMPRESSION MICRODISCECTOMY N/A 11/11/2021   Procedure: LUMBAR THREE-FOUR SUBLAMINAR DECOMPRESSION;  Surgeon: Colon Shove, MD;  Location: [facility] OR;  Service: Neurosurgery;  Laterality: N/A;   TONSILLECTOMY     ULTRASOUND GUIDANCE FOR VASCULAR ACCESS  03/02/2018   Procedure: Ultrasound Guidance For Vascular Access;  Surgeon: Jordan, Peter M, MD;  Location: [facility] INVASIVE CV LAB;  Service: Cardiovascular;;   VASECTOMY     WISDOM TOOTH EXTRACTION      Allergies  Allergies[1]  Home Medications    Prior to Admission medications  Medication Sig Start Date End Date Taking? Authorizing Provider  allopurinol  (ZYLOPRIM ) 100 MG tablet Take 100 mg by mouth daily. 01/20/18   [provider]  amLODipine  (NORVASC ) 10 MG tablet TAKE ONE TABLET BY MOUTH ONCE DAILY 01/10/24   Krasowski, Robert J, MD  aspirin  EC 81 MG tablet Take 1 tablet (81 mg total) by mouth daily. 11/17/16   Krasowski, Robert J, MD  clopidogrel  (PLAVIX ) 75 MG tablet TAKE ONE TABLET BY MOUTH ONCE DAILY WITH BREAKFAST 03/02/24   Bernie Lamar PARAS, MD  cyanocobalamin  (,VITAMIN B-12,) 1000 MCG/ML injection Inject 1,000 mcg into the muscle every 30  (thirty) days. 07/31/21   [provider]  gabapentin  (NEURONTIN ) 300 MG capsule Take 300 mg by mouth 3 (three) times daily. 11/05/23   [provider]  glipiZIDE  (GLUCOTROL  XL) 5 MG 24 hr tablet Take 5 mg by mouth daily. 08/30/23   [provider]  HYDROmorphone  (DILAUDID ) 2 MG tablet Take 1 tablet (2 mg total) by mouth every 6 (six) hours as needed for severe pain (pain score 7-10). 11/20/23   Johnanna Credit Caylin, PA-C  isosorbide  mononitrate (IMDUR ) 60 MG 24 hr tablet Take 1 tablet (60 mg total) by mouth daily. 03/02/24   Krasowski, Robert J, MD  methocarbamol  (ROBAXIN ) 500 MG tablet Take 1 tablet (500 mg total) by mouth every 6 (six) hours as needed for muscle spasms. 02/24/23   Colon Shove, MD  metoprolol  tartrate (LOPRESSOR ) 50 MG tablet TAKE 1 AND 1/2 TABLETS BY MOUTH TWICE DAILY 01/25/23   Krasowski, Robert J, MD  naproxen (NAPROSYN) 500 MG tablet Take 500 mg by mouth 2 (two) times daily as needed for mild pain (pain score 1-3) or moderate pain (pain score 4-6). 10/07/23   [provider]  nitroGLYCERIN  (NITROSTAT ) 0.4 MG SL tablet DISSOLVE 1 TABLET UNDER THE TONGUE EVERY 5 MINUTES AS NEEDED FOR CHEST PAIN. DO NOT EXCEED A TOTAL OF 3 DOSES IN 15 MINUTES. Patient taking differently: Place 0.4 mg under the tongue every 5 (five) minutes as needed for chest pain. 09/02/22   Krasowski, Robert J, MD  ranolazine  (RANEXA ) 1000 MG SR tablet Take 1 tablet (1,000 mg total) by mouth 2 (two) times daily. 09/28/23   Krasowski, Robert J, MD  rosuvastatin  (CRESTOR ) 40 MG tablet TAKE ONE TABLET BY MOUTH ONCE DAILY 01/10/24   Krasowski, Robert J, MD  timolol (TIMOPTIC) 0.25 % ophthalmic solution Place 1 drop into the right eye every morning. 02/09/24   [provider]  tiZANidine  (ZANAFLEX ) 4 MG capsule Take 4 mg by mouth 3 (three) times daily as needed for muscle spasms.    [provider]  VASCEPA  1 g capsule Take 2 capsules (2 g total) by mouth 2 times daily  at 12 noon and 4 pm. 10/08/20   Hilty, Vinie BROCKS, MD  Vitamin D , Ergocalciferol , (DRISDOL ) 50000 units CAPS capsule Take 50,000 Units by mouth every Sunday.  08/02/17   [provider]    Physical Exam    Vital Signs:  Robert CROME Hymas does not have vital signs available for review today.  Given telephonic nature of communication, physical exam is limited. AAOx3. NAD. Normal affect.  Speech and respirations are unlabored.   Assessment & Plan    Preoperative cardiovascular risk assessment.  Vitrectomy with AC washout of right eye by Dr. Cherise on 04/19/2024.  Chart reviewed as part of pre-operative protocol coverage. According to the RCRI, patient has a 6.6% risk of MACE. Patient reports activity equivalent to 4.06  METS (per [facility]).   Given past medical history and time since last visit, based on ACC/AHA guidelines, Rusell L Huizar would be at acceptable risk for the planned procedure without further cardiovascular testing.   Patient was advised that if he develops new symptoms prior to surgery to contact our office to arrange a follow-up appointment.  he verbalized understanding.  Per office protocol and Dr. Krasowski, he may hold Plavix  for 5 days and aspirin  for 5-7 days prior to procedure and should resume as soon as hemodynamically stable postoperatively. Per Dr. Krasowski That should be acceptable to hold those medications so obviously there are always some risk of it all risk risks and benefits need to be considered.  I will route this recommendation to the requesting party via Epic fax function.  Please call with questions.  Time:   Today, I have spent 6 minutes with the patient with telehealth technology discussing medical history, symptoms, and management plan.     Barnie Hila, NP  03/31/2024, 7:45 AM     [1]  Allergies Allergen Reactions   Penicillins Anaphylaxis, Swelling and Rash   Hydrocodone Nausea Only    Sick on stomach all day long     Oxycodone  Nausea Only    Sick on stomach all day long    Zolpidem Other (See Comments)    dizziness   Januvia [Sitagliptin] Nausea And Vomiting and Rash    Full body rash   "

## 2024-08-09 ENCOUNTER — Inpatient Hospital Stay

## 2024-08-16 ENCOUNTER — Inpatient Hospital Stay: Admitting: Oncology
